# Patient Record
Sex: Male | Born: 1940
Health system: Southern US, Community
[De-identification: ages and names within clinical notes are randomized; demographics above are authoritative.]

## PROBLEM LIST (undated history)

## (undated) DIAGNOSIS — Z87442 Personal history of urinary calculi: Secondary | ICD-10-CM

## (undated) DIAGNOSIS — C61 Malignant neoplasm of prostate: Secondary | ICD-10-CM

## (undated) DIAGNOSIS — K209 Esophagitis, unspecified without bleeding: Secondary | ICD-10-CM

## (undated) DIAGNOSIS — K4090 Unilateral inguinal hernia, without obstruction or gangrene, not specified as recurrent: Secondary | ICD-10-CM

## (undated) DIAGNOSIS — K219 Gastro-esophageal reflux disease without esophagitis: Secondary | ICD-10-CM

## (undated) DIAGNOSIS — K648 Other hemorrhoids: Secondary | ICD-10-CM

## (undated) DIAGNOSIS — I10 Essential (primary) hypertension: Secondary | ICD-10-CM

## (undated) DIAGNOSIS — K5790 Diverticulosis of intestine, part unspecified, without perforation or abscess without bleeding: Secondary | ICD-10-CM

## (undated) DIAGNOSIS — E785 Hyperlipidemia, unspecified: Secondary | ICD-10-CM

## (undated) DIAGNOSIS — K222 Esophageal obstruction: Secondary | ICD-10-CM

## (undated) DIAGNOSIS — M109 Gout, unspecified: Secondary | ICD-10-CM

## (undated) DIAGNOSIS — R0600 Dyspnea, unspecified: Secondary | ICD-10-CM

## (undated) DIAGNOSIS — K859 Acute pancreatitis without necrosis or infection, unspecified: Secondary | ICD-10-CM

## (undated) DIAGNOSIS — F419 Anxiety disorder, unspecified: Secondary | ICD-10-CM

## (undated) DIAGNOSIS — R972 Elevated prostate specific antigen [PSA]: Secondary | ICD-10-CM

## (undated) DIAGNOSIS — T7840XA Allergy, unspecified, initial encounter: Secondary | ICD-10-CM

## (undated) DIAGNOSIS — D126 Benign neoplasm of colon, unspecified: Secondary | ICD-10-CM

## (undated) DIAGNOSIS — M1990 Unspecified osteoarthritis, unspecified site: Secondary | ICD-10-CM

## (undated) DIAGNOSIS — H269 Unspecified cataract: Secondary | ICD-10-CM

## (undated) HISTORY — DX: Unspecified osteoarthritis, unspecified site: M19.90

## (undated) HISTORY — DX: Diverticulosis of intestine, part unspecified, without perforation or abscess without bleeding: K57.90

## (undated) HISTORY — DX: Malignant neoplasm of prostate: C61

## (undated) HISTORY — DX: Benign neoplasm of colon, unspecified: D12.6

## (undated) HISTORY — DX: Allergy, unspecified, initial encounter: T78.40XA

## (undated) HISTORY — PX: UPPER GASTROINTESTINAL ENDOSCOPY: SHX188

## (undated) HISTORY — DX: Anxiety disorder, unspecified: F41.9

## (undated) HISTORY — PX: TONSILLECTOMY: SUR1361

## (undated) HISTORY — DX: Gastro-esophageal reflux disease without esophagitis: K21.9

## (undated) HISTORY — DX: Hyperlipidemia, unspecified: E78.5

## (undated) HISTORY — PX: OTHER SURGICAL HISTORY: SHX169

## (undated) HISTORY — DX: Unspecified cataract: H26.9

## (undated) HISTORY — DX: Other hemorrhoids: K64.8

## (undated) HISTORY — DX: Esophagitis, unspecified without bleeding: K20.90

## (undated) HISTORY — PX: COLONOSCOPY: SHX174

## (undated) HISTORY — DX: Esophageal obstruction: K22.2

## (undated) HISTORY — DX: Elevated prostate specific antigen (PSA): R97.20

## (undated) HISTORY — DX: Essential (primary) hypertension: I10

## (undated) HISTORY — PX: BACK SURGERY: SHX140

---
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
## 1993-08-03 ENCOUNTER — Encounter: Payer: Self-pay | Admitting: Family Medicine

## 1996-11-03 ENCOUNTER — Encounter: Payer: Self-pay | Admitting: Family Medicine

## 1997-03-05 ENCOUNTER — Encounter: Payer: Self-pay | Admitting: Family Medicine

## 1999-07-04 ENCOUNTER — Encounter: Payer: Self-pay | Admitting: Family Medicine

## 2001-02-02 ENCOUNTER — Encounter: Payer: Self-pay | Admitting: Family Medicine

## 2001-04-05 ENCOUNTER — Encounter: Payer: Self-pay | Admitting: Family Medicine

## 2001-04-05 LAB — CONVERTED CEMR LAB: PSA: 2.4 ng/mL

## 2002-08-25 ENCOUNTER — Encounter: Payer: Self-pay | Admitting: Orthopedic Surgery

## 2002-09-02 ENCOUNTER — Inpatient Hospital Stay (HOSPITAL_COMMUNITY): Admission: RE | Admit: 2002-09-02 | Discharge: 2002-09-07 | Payer: Self-pay | Admitting: Orthopedic Surgery

## 2002-09-02 HISTORY — PX: TOTAL KNEE ARTHROPLASTY: SHX125

## 2002-09-07 ENCOUNTER — Inpatient Hospital Stay (HOSPITAL_COMMUNITY)
Admission: RE | Admit: 2002-09-07 | Discharge: 2002-09-11 | Payer: Self-pay | Admitting: Physical Medicine & Rehabilitation

## 2002-11-04 ENCOUNTER — Encounter: Payer: Self-pay | Admitting: Family Medicine

## 2002-11-04 LAB — CONVERTED CEMR LAB: PSA: 3.7 ng/mL

## 2003-05-04 ENCOUNTER — Encounter: Payer: Self-pay | Admitting: Family Medicine

## 2003-05-04 LAB — CONVERTED CEMR LAB: PSA: 2.5 ng/mL

## 2004-01-11 ENCOUNTER — Ambulatory Visit: Payer: Self-pay | Admitting: Family Medicine

## 2004-05-03 ENCOUNTER — Encounter: Payer: Self-pay | Admitting: Family Medicine

## 2004-05-03 LAB — CONVERTED CEMR LAB: PSA: 4.35 ng/mL

## 2004-06-01 ENCOUNTER — Ambulatory Visit: Payer: Self-pay | Admitting: Family Medicine

## 2004-06-05 ENCOUNTER — Ambulatory Visit: Payer: Self-pay | Admitting: Family Medicine

## 2004-06-19 ENCOUNTER — Ambulatory Visit: Payer: Self-pay | Admitting: Family Medicine

## 2004-07-03 ENCOUNTER — Ambulatory Visit: Payer: Self-pay | Admitting: Family Medicine

## 2004-07-03 LAB — CONVERTED CEMR LAB
PSA: 2.68 ng/mL
PSA: 2.68 ng/mL

## 2004-07-06 ENCOUNTER — Ambulatory Visit: Payer: Self-pay | Admitting: Family Medicine

## 2005-06-03 ENCOUNTER — Encounter: Payer: Self-pay | Admitting: Family Medicine

## 2005-06-03 LAB — CONVERTED CEMR LAB
PSA: 4.12 ng/mL
PSA: 4.12 ng/mL

## 2005-06-11 ENCOUNTER — Ambulatory Visit: Payer: Self-pay | Admitting: Family Medicine

## 2005-06-13 ENCOUNTER — Ambulatory Visit: Payer: Self-pay | Admitting: Family Medicine

## 2005-06-28 ENCOUNTER — Ambulatory Visit: Payer: Self-pay | Admitting: Family Medicine

## 2006-06-04 ENCOUNTER — Encounter: Payer: Self-pay | Admitting: Family Medicine

## 2006-06-04 LAB — CONVERTED CEMR LAB: PSA: 3.98 ng/mL

## 2006-06-18 ENCOUNTER — Ambulatory Visit: Payer: Self-pay | Admitting: Family Medicine

## 2006-06-18 LAB — CONVERTED CEMR LAB
BUN: 16 mg/dL (ref 6–23)
Chloride: 108 meq/L (ref 96–112)
Creatinine, Ser: 1.2 mg/dL (ref 0.4–1.5)
Glucose, Bld: 103 mg/dL — ABNORMAL HIGH (ref 70–99)
PSA: 3.98 ng/mL (ref 0.10–4.00)
Sodium: 140 meq/L (ref 135–145)

## 2006-06-21 ENCOUNTER — Ambulatory Visit: Payer: Self-pay | Admitting: Family Medicine

## 2006-06-26 ENCOUNTER — Ambulatory Visit: Payer: Self-pay | Admitting: Family Medicine

## 2006-06-26 LAB — CONVERTED CEMR LAB
Basophils Absolute: 0.1 10*3/uL (ref 0.0–0.1)
Basophils Relative: 0.6 % (ref 0.0–1.0)
Eosinophils Absolute: 0.3 10*3/uL (ref 0.0–0.6)
Eosinophils Relative: 3.2 % (ref 0.0–5.0)
Glucose, Bld: 95 mg/dL (ref 70–99)
HCT: 41 % (ref 39.0–52.0)
Hemoglobin: 14.5 g/dL (ref 13.0–17.0)
Hgb A1c MFr Bld: 5.6 % (ref 4.6–6.0)
MCHC: 35.3 g/dL (ref 30.0–36.0)
MCV: 89.2 fL (ref 78.0–100.0)
Monocytes Absolute: 0.5 10*3/uL (ref 0.2–0.7)
Monocytes Relative: 5.7 % (ref 3.0–11.0)
Neutro Abs: 6.6 10*3/uL (ref 1.4–7.7)
Platelets: 176 10*3/uL (ref 150–400)
WBC: 9.6 10*3/uL (ref 4.5–10.5)

## 2006-07-15 ENCOUNTER — Ambulatory Visit: Payer: Self-pay | Admitting: Family Medicine

## 2006-07-16 ENCOUNTER — Encounter: Payer: Self-pay | Admitting: Family Medicine

## 2006-08-19 ENCOUNTER — Encounter: Payer: Self-pay | Admitting: Family Medicine

## 2006-08-19 DIAGNOSIS — R7989 Other specified abnormal findings of blood chemistry: Secondary | ICD-10-CM | POA: Insufficient documentation

## 2006-08-19 DIAGNOSIS — F411 Generalized anxiety disorder: Secondary | ICD-10-CM | POA: Insufficient documentation

## 2006-08-19 DIAGNOSIS — I10 Essential (primary) hypertension: Secondary | ICD-10-CM | POA: Insufficient documentation

## 2006-08-19 DIAGNOSIS — E785 Hyperlipidemia, unspecified: Secondary | ICD-10-CM | POA: Insufficient documentation

## 2006-08-21 ENCOUNTER — Ambulatory Visit: Payer: Self-pay | Admitting: Family Medicine

## 2006-08-21 DIAGNOSIS — R197 Diarrhea, unspecified: Secondary | ICD-10-CM

## 2006-09-16 ENCOUNTER — Ambulatory Visit: Payer: Self-pay | Admitting: Gastroenterology

## 2006-10-31 ENCOUNTER — Encounter: Payer: Self-pay | Admitting: Gastroenterology

## 2006-10-31 ENCOUNTER — Ambulatory Visit: Payer: Self-pay | Admitting: Gastroenterology

## 2006-11-07 ENCOUNTER — Ambulatory Visit: Payer: Self-pay | Admitting: Gastroenterology

## 2006-11-07 LAB — HM COLONOSCOPY

## 2006-11-21 ENCOUNTER — Ambulatory Visit: Payer: Self-pay | Admitting: Gastroenterology

## 2006-11-21 ENCOUNTER — Encounter: Payer: Self-pay | Admitting: Gastroenterology

## 2006-11-21 ENCOUNTER — Encounter: Payer: Self-pay | Admitting: Family Medicine

## 2006-11-21 DIAGNOSIS — K209 Esophagitis, unspecified without bleeding: Secondary | ICD-10-CM | POA: Insufficient documentation

## 2006-11-21 HISTORY — PX: ESOPHAGOGASTRODUODENOSCOPY: SHX1529

## 2006-12-02 ENCOUNTER — Ambulatory Visit: Payer: Self-pay | Admitting: Gastroenterology

## 2007-01-17 ENCOUNTER — Ambulatory Visit: Payer: Self-pay | Admitting: Family Medicine

## 2007-01-19 LAB — CONVERTED CEMR LAB
BUN: 19 mg/dL (ref 6–23)
CO2: 30 meq/L (ref 19–32)
Calcium: 9.1 mg/dL (ref 8.4–10.5)
Chloride: 104 meq/L (ref 96–112)
GFR calc non Af Amer: 59 mL/min
Glucose, Bld: 97 mg/dL (ref 70–99)

## 2007-01-22 ENCOUNTER — Ambulatory Visit: Payer: Self-pay | Admitting: Family Medicine

## 2007-06-23 ENCOUNTER — Ambulatory Visit: Payer: Self-pay | Admitting: Family Medicine

## 2007-06-23 LAB — CONVERTED CEMR LAB
ALT: 24 units/L (ref 0–53)
Bilirubin, Direct: 0.1 mg/dL (ref 0.0–0.3)
Calcium: 9.4 mg/dL (ref 8.4–10.5)
Creatinine, Ser: 1.4 mg/dL (ref 0.4–1.5)
Creatinine,U: 293.3 mg/dL
GFR calc Af Amer: 65 mL/min
Glucose, Bld: 95 mg/dL (ref 70–99)
HDL: 30.8 mg/dL — ABNORMAL LOW (ref >39.0)
Microalb Creat Ratio: 11.6 mg/g (ref 0.0–30.0)
Microalb, Ur: 3.4 mg/dL — ABNORMAL HIGH (ref 0.0–1.9)
Sodium: 140 meq/L (ref 135–145)
Total CHOL/HDL Ratio: 5.6
Total Protein: 7.6 g/dL (ref 6.0–8.3)
Triglycerides: 147 mg/dL (ref 0–149)
VLDL: 29 mg/dL (ref 0–40)

## 2007-06-26 ENCOUNTER — Ambulatory Visit: Payer: Self-pay | Admitting: Family Medicine

## 2007-06-26 DIAGNOSIS — F329 Major depressive disorder, single episode, unspecified: Secondary | ICD-10-CM

## 2007-06-26 DIAGNOSIS — F3289 Other specified depressive episodes: Secondary | ICD-10-CM | POA: Insufficient documentation

## 2007-07-25 ENCOUNTER — Ambulatory Visit: Payer: Self-pay | Admitting: Family Medicine

## 2007-07-25 LAB — CONVERTED CEMR LAB: OCCULT 1: NEGATIVE

## 2007-07-29 ENCOUNTER — Encounter (INDEPENDENT_AMBULATORY_CARE_PROVIDER_SITE_OTHER): Payer: Self-pay | Admitting: *Deleted

## 2007-10-10 ENCOUNTER — Ambulatory Visit: Payer: Self-pay | Admitting: Family Medicine

## 2007-10-11 ENCOUNTER — Encounter: Payer: Self-pay | Admitting: Family Medicine

## 2007-10-14 ENCOUNTER — Telehealth: Payer: Self-pay | Admitting: Family Medicine

## 2008-04-29 ENCOUNTER — Emergency Department: Payer: Self-pay | Admitting: Emergency Medicine

## 2008-04-30 ENCOUNTER — Telehealth: Payer: Self-pay | Admitting: Family Medicine

## 2008-06-24 ENCOUNTER — Telehealth: Payer: Self-pay | Admitting: Family Medicine

## 2008-06-28 ENCOUNTER — Ambulatory Visit: Payer: Self-pay | Admitting: Family Medicine

## 2008-06-29 LAB — CONVERTED CEMR LAB
ALT: 28 units/L (ref 0–53)
Alkaline Phosphatase: 74 units/L (ref 39–117)
BUN: 20 mg/dL (ref 6–23)
Basophils Relative: 0.3 % (ref 0.0–3.0)
Bilirubin, Direct: 0.1 mg/dL (ref 0.0–0.3)
Calcium: 9.3 mg/dL (ref 8.4–10.5)
Chloride: 108 meq/L (ref 96–112)
Cholesterol: 170 mg/dL (ref 0–200)
Creatinine, Ser: 1.3 mg/dL (ref 0.4–1.5)
Eosinophils Relative: 3.7 % (ref 0.0–5.0)
GFR calc non Af Amer: 58.22 mL/min (ref >60)
HDL: 33.2 mg/dL — ABNORMAL LOW (ref >39.00)
LDL Cholesterol: 121 mg/dL — ABNORMAL HIGH (ref 0–99)
Lymphocytes Relative: 26.8 % (ref 12.0–46.0)
MCV: 91.4 fL (ref 78.0–100.0)
Microalb, Ur: 0.4 mg/dL (ref 0.0–1.9)
Monocytes Absolute: 0.3 10*3/uL (ref 0.1–1.0)
Monocytes Relative: 4.5 % (ref 3.0–12.0)
Neutrophils Relative %: 64.7 % (ref 43.0–77.0)
PSA: 4.12 ng/mL — ABNORMAL HIGH (ref 0.10–4.00)
Platelets: 161 10*3/uL (ref 150.0–400.0)
RBC: 4.45 M/uL (ref 4.22–5.81)
Total Bilirubin: 0.9 mg/dL (ref 0.3–1.2)
Total CHOL/HDL Ratio: 5
Total Protein: 7.1 g/dL (ref 6.0–8.3)
Triglycerides: 79 mg/dL (ref 0.0–149.0)
VLDL: 15.8 mg/dL (ref 0.0–40.0)
WBC: 6.6 10*3/uL (ref 4.5–10.5)

## 2008-06-30 ENCOUNTER — Ambulatory Visit: Payer: Self-pay | Admitting: Family Medicine

## 2008-06-30 DIAGNOSIS — K5730 Diverticulosis of large intestine without perforation or abscess without bleeding: Secondary | ICD-10-CM | POA: Insufficient documentation

## 2009-06-22 ENCOUNTER — Ambulatory Visit: Payer: Self-pay | Admitting: Family Medicine

## 2009-06-22 LAB — CONVERTED CEMR LAB
AST: 29 units/L (ref 0–37)
Alkaline Phosphatase: 60 units/L (ref 39–117)
Bilirubin, Direct: 0.1 mg/dL (ref 0.0–0.3)
CO2: 31 meq/L (ref 19–32)
Calcium: 9 mg/dL (ref 8.4–10.5)
Creatinine,U: 187.6 mg/dL
Eosinophils Relative: 4.9 % (ref 0.0–5.0)
GFR calc non Af Amer: 63.67 mL/min (ref >60)
HDL: 35.7 mg/dL — ABNORMAL LOW (ref >39.00)
Lymphocytes Relative: 28.4 % (ref 12.0–46.0)
Microalb Creat Ratio: 6.4 mg/g (ref 0.0–30.0)
Microalb, Ur: 1.2 mg/dL (ref 0.0–1.9)
Monocytes Relative: 7.4 % (ref 3.0–12.0)
Neutrophils Relative %: 58.8 % (ref 43.0–77.0)
PSA: 4.03 ng/mL — ABNORMAL HIGH (ref 0.10–4.00)
Platelets: 178 10*3/uL (ref 150.0–400.0)
Potassium: 4.3 meq/L (ref 3.5–5.1)
RBC: 4.56 M/uL (ref 4.22–5.81)
Sodium: 140 meq/L (ref 135–145)
TSH: 2.47 microintl units/mL (ref 0.35–5.50)
Total CHOL/HDL Ratio: 4
VLDL: 25.2 mg/dL (ref 0.0–40.0)
WBC: 7.1 10*3/uL (ref 4.5–10.5)

## 2009-07-04 ENCOUNTER — Ambulatory Visit: Payer: Self-pay | Admitting: Family Medicine

## 2009-12-20 ENCOUNTER — Telehealth: Payer: Self-pay | Admitting: Family Medicine

## 2010-02-07 ENCOUNTER — Encounter: Payer: Self-pay | Admitting: Family Medicine

## 2010-04-04 NOTE — Assessment & Plan Note (Signed)
Summary: CHECK UP/CLE   Vital Signs:  Patient profile:   70 year old male Height:      69 inches Weight:      234.75 pounds BMI:     34.79 Temp:     97.7 degrees F oral Pulse rate:   64 / minute Pulse rhythm:   regular BP sitting:   112 / 68  (left arm) Cuff size:   large  Vitals Entered By: Sydell Axon LPN (Jul 04, 8248 9:11 AM) CC: 30 Minute checkup, had a colonoscopy 08/08 by Dr. Arlyce Dice   History of Present Illness: Pt here for Comp Exam. He feels well and has no complaints.  Preventive Screening-Counseling & Management  Alcohol-Tobacco     Alcohol drinks/day: 0     Smoking Status: never     Passive Smoke Exposure: no  Caffeine-Diet-Exercise     Caffeine use/day: 3     Does Patient Exercise: yes no formal exercise     Type of exercise: physical work, Systems analyst, Research scientist (physical sciences) in [place]     Times/week: 3  Problems Prior to Update: 1)  Health Maintenance Exam  (ICD-V70.0) 2)  Special Screening Malignant Neoplasm of Prostate  (ICD-V76.44) 3)  Diverticulosis, Colon  (ICD-562.10) 4)  Esophagitis  (ICD-530.10) 5)  Depression  (ICD-311) 6)  Tinea Cruris  (ICD-110.3) 7)  Esophagitis, Acute  (ICD-530.12) 8)  Esophageal Stricture, Dilated  (ICD-530.3) 9)  Diarrhea, Bloody  (ICD-787.91) 10)  Hyperglycemia  (ICD-790.6) 11)  Hypertension  (ICD-401.9) 12)  Hyperlipidemia  (ICD-272.4) 13)  Anxiety  (ICD-300.00) 14)  Screening For Malignannt Neoplasm, Site Nec  (ICD-V76.49)  Medications Prior to Update: 1)  Benazepril-Hydrochlorothiazide 20-12.5 Mg Tabs (Benazepril-Hydrochlorothiazide) .... Take 1 Tablet By Mouth Once A Day 2)  Theragran-M Fish Oil Conc 1200 Mg  Caps (Omega-3 Fatty Acids) .Marland Kitchen.. 1 By Mouth Daily 3)  Cvs Spectravite Senior   Tabs (Multiple Vitamins-Minerals) .Marland Kitchen.. 1 By Mouth Qam 4)  Naproxen Dr 375 Mg  Tbec (Naproxen) .... As Needed  - No More Than 2/day 5)  Viagra   Tabs (Sildenafil Citrate Tabs) .... As Needed 6)  Zovirax 5 %  Oint (Acyclovir) .... As Needed Cold  Sores 7)  Polysporin   Oint (Bacitracin-Polymyxin B Oint) .... As Needed 8)  Antifungal   Crea (Tolnaftate Crea) .... As Needed 9)  Fiber Tablets 10)  Naftin 1 %  Crea (Naftifine Hcl) .... As Needed  - Prescribed By Vaughan Sine.  Allergies: 1)  ! Tylenol 8 Hour 2)  Penicillin V Potassium (Penicillin V Potassium) 3)  Aspirin (Aspirin) 4)  * Otc Meds  Past History:  Past Medical History: Last updated: 05/16/2007 Anxiety Hyperlipidemia Hypertension  Past Surgical History: Last updated: 06/30/2008 1947        Tonsillectomy 1957        Bx of Lymph Glands 1980s      Back Surgery  L4/5 09/02/02    Bilat. total knee replacement   (Dr Despina Hick)           Spinal/epidural [facility] 08/12/97    Cath - nml. EF 65% 06/26/91    CT L/S stenosis L4-5 with disc bulge, right 11/21/06    EGD Esophagitis, Stricture/dilated 11/21/06    Colonoscopy [facility] (Dr Arlyce Dice)  Family History: Last updated: 07/16/09 Father: Died 29 (2005-05-15) ulcers, diverticulitis with peritonitis Mother: Died 4 multiple problems Brother A 72  Channing Mutters)  HTN, obese (350#) HBP:  (+) brother, self Depression:  (+) Mother's side, self once ETOH:  (+)  mother's brother Stroke:  GM  Social History: Last updated: 08/19/2006 Marital Status:Re- Married, lives with wife Children: 1 daughter Haynes Dage), 2 step-children Occupation: Retired Designer, 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 Never Smoked Alcohol use-no Drug use-no  Risk Factors: Alcohol Use: 0 (07/04/2009) Caffeine Use: 3 (07/04/2009) Exercise: yes no formal exercise (07/04/2009)  Risk Factors: Smoking Status: never (07/04/2009) Passive Smoke Exposure: no (07/04/2009)  Family History: Father: Died 63 (06-02-05) ulcers, diverticulitis with peritonitis Mother: Died 66 multiple problems Brother A 72  (Roy)  HTN, obese (350#) HBP:  (+) brother, self Depression:  (+) Mother's side, self once ETOH:  (+) mother's brother Stroke:  GM  Review of Systems General:   Denies chills, fatigue, fever, sweats, weakness, and weight loss. Eyes:  Denies blurring, discharge, eye pain, and itching; developing catarract. ENT:  Denies decreased hearing, earache, and ringing in ears. CV:  Denies chest pain or discomfort, fainting, fatigue, palpitations, shortness of breath with exertion, swelling of feet, and swelling of hands. Resp:  Denies cough, shortness of breath, and wheezing. GI:  Complains of diarrhea; denies abdominal pain, bloody stools, change in bowel habits, constipation, dark tarry stools, indigestion, loss of appetite, nausea, vomiting, vomiting blood, and yellowish skin color; occas. GU:  Denies discharge, nocturia, and urinary frequency. MS:  Complains of joint pain; denies low back pain, muscle aches, cramps, and stiffness; right wrist fracture now with arthritis. Derm:  Denies dryness, itching, and rash. Neuro:  Denies numbness, poor balance, tingling, and tremors.  Physical Exam  General:  Well-developed,well-nourished,in no acute distress; alert,appropriate and cooperative throughout examination, mildly overweight. Head:  Normocephalic and atraumatic without obvious abnormalities. No apparent alopecia or balding. Sinuses nontender. Eyes:  Conjunctiva clear bilaterally.  Ears:  External ear exam shows no significant lesions or deformities.  Otoscopic examination reveals clear canals, tympanic membranes are intact bilaterally without bulging, retraction, inflammation or discharge. Hearing is grossly normal bilaterally. Nose:  External nasal examination shows no deformity or inflammation. Nasal mucosa are pink and moist without lesions or exudates. Mouth:  Oral mucosa and oropharynx without lesions or exudates.  Teeth in good repair. Neck:  No deformities, masses, or tenderness noted. Chest Wall:  No deformities, masses, tenderness or gynecomastia noted. Breasts:  No masses or gynecomastia noted Lungs:  Normal respiratory effort, chest expands  symmetrically. Lungs are clear to auscultation, no crackles or wheezes. Heart:  Normal rate and regular rhythm. S1 and S2 normal without gallop, murmur, click, rub or other extra sounds. Abdomen:  Bowel sounds positive,abdomen soft and non-tender without masses, organomegaly  noted. Rectal:  No external abnormalities noted. Normal sphincter tone. No rectal masses or tenderness. G neg. Genitalia:  Testes bilaterally descended without nodularity, tenderness or masses. No scrotal masses or lesions. No penis lesions or urethral discharge. left inguinal hernia, chronic...does not seem to need attention. Prostate:  Prostate gland firm and smooth, no enlargement, nodularity, tenderness, mass, asymmetry or induration. 40gms. Msk:  No deformity or scoliosis noted of thoracic or lumbar spine.   Pulses:  R and L radial and posterior tibial pulses are full and equal bilaterally  Extremities:  4th digit right hand with irregular nail bed, majority of nail fallen off, mild erythema at nail bed, nontender, no fluctuance Neurologic:  No cranial nerve deficits noted. Station and gait are normal.  Sensory, motor and coordinative functions appear intact. Skin:  Intact without suspicious lesions or rashes Cervical Nodes:  No lymphadenopathy noted Inguinal Nodes:  No significant adenopathy Psych:  Cognition  and judgment appear intact. Alert and cooperative with normal attention span and concentration. No apparent delusions, illusions, hallucinations   Impression & Recommendations:  Problem # 1:  HEALTH MAINTENANCE EXAM (ICD-V70.0)  Reviewed preventive care protocols, scheduled due services, and updated immunizations. Look into Zostavax.  Problem # 2:  SPECIAL SCREENING MALIGNANT NEOPLASM OF PROSTATE (ICD-V76.44) Assessment: Unchanged PSA chronically elevated...ap[pears stable.  Problem # 3:  DIVERTICULOSIS, COLON (ICD-562.10) Assessment: Unchanged  Discussed being seen for prolonged LLQ discomfort.  Labs  Reviewed: Hgb: 14.6 (06/22/2009)   Hct: 41.1 (06/22/2009)   WBC: 7.1 (06/22/2009)  Problem # 4:  DEPRESSION (ICD-311) Assessment: Unchanged Stable.  Problem # 5:  ESOPHAGEAL STRICTURE, DILATED (ICD-530.3) Assessment: Unchanged Stable, no recent probs.  Problem # 6:  HYPERGLYCEMIA (ICD-790.6) Assessment: Improved Euglycemic today. Wt loss will help.  Problem # 7:  HYPERTENSION (ICD-401.9) Assessment: Unchanged  His updated medication list for this problem includes:    Benazepril-hydrochlorothiazide 20-12.5 Mg Tabs (Benazepril-hydrochlorothiazide) .Marland Kitchen... Take 1 tablet by mouth once a day  BP today: 112/68 Prior BP: 120/80 (06/30/2008)  Labs Reviewed: K+: 4.3 (06/22/2009) Creat: : 1.2 (06/22/2009)   Chol: 156 (06/22/2009)   HDL: 35.70 (06/22/2009)   LDL: 95 (06/22/2009)   TG: 126.0 (06/22/2009)  Problem # 8:  HYPERLIPIDEMIA (ICD-272.4) Assessment: Unchanged Stable. Labs Reviewed: SGOT: 29 (06/22/2009)   SGPT: 28 (06/22/2009)   HDL:35.70 (06/22/2009), 33.20 (06/28/2008)  LDL:95 (06/22/2009), 121 (16/12/9602)  Chol:156 (06/22/2009), 170 (06/28/2008)  Trig:126.0 (06/22/2009), 79.0 (06/28/2008)  Problem # 9:  ANXIETY (ICD-300.00) Assessment: Unchanged Stable.  Complete Medication List: 1)  Benazepril-hydrochlorothiazide 20-12.5 Mg Tabs (Benazepril-hydrochlorothiazide) .... Take 1 tablet by mouth once a day 2)  Theragran-m Fish Oil Conc 1200 Mg Caps (Omega-3 fatty acids) .Marland Kitchen.. 1 by mouth daily 3)  Cvs Spectravite Senior Tabs (Multiple vitamins-minerals) .Marland Kitchen.. 1 by mouth qam 4)  Naproxen Dr 375 Mg Tbec (Naproxen) .... As needed  - no more than 2/day 5)  Viagra Tabs (Sildenafil citrate tabs) .... As needed 6)  Zovirax 5 % Oint (Acyclovir) .... As needed cold sores 7)  Polysporin Oint (Bacitracin-polymyxin b oint) .... As needed 8)  Antifungal Crea (Tolnaftate crea) .... As needed 9)  Fiber Tablets  10)  Naftin 1 % Crea (Naftifine hcl) .... As needed  - prescribed by derm. 11)   Citrucel Powd (Methylcellulose (laxative)) .... Take daily as directed  Patient Instructions: 1)  RTC one year, sooner as needed.  Current Allergies (reviewed today): ! TYLENOL 8 HOUR PENICILLIN V POTASSIUM (PENICILLIN V POTASSIUM) ASPIRIN (ASPIRIN) * OTC MEDS

## 2010-04-04 NOTE — Miscellaneous (Signed)
Summary: Zostavax   Clinical Lists Changes  Observations: Added new observation of ZOSTAVAX: Zostavax (12/28/2009 8:55)      Other Immunization History:    Zostavax # 1:  Zostavax (12/28/2009) Received form from [facility], [place], [place]

## 2010-04-04 NOTE — Progress Notes (Signed)
Summary: wants order for zostavax  Phone Note Call from Patient   Caller: Spouse Summary of Call: Pt is requesting order for zostavax.  He wants to get this at [facility], which is more convenient for him.  His insurance will pay.  Please send order to [facility]. Initial call taken by: Lowella Petties CMA,  December 20, 2009 4:53 PM    New/Updated Medications: ZOSTAVAX 71062 UNT/0.65ML SOLR (ZOSTER VACCINE LIVE) pls administer one dose Prescriptions: ZOSTAVAX 69485 UNT/0.65ML SOLR (ZOSTER VACCINE LIVE) pls administer one dose  #1 x 0   Entered and Authorized by:   Shaune Leeks MD   Signed by:   Shaune Leeks MD on 12/20/2009   Method used:   Electronically to        [facility] (retail)       [address]       [place], [place]  [address]       Ph: [phone]       Fax: [phone]   RxID:   [id number]

## 2010-05-11 ENCOUNTER — Encounter: Payer: Self-pay | Admitting: Family Medicine

## 2010-06-19 ENCOUNTER — Other Ambulatory Visit: Payer: Self-pay | Admitting: Family Medicine

## 2010-06-19 DIAGNOSIS — R7989 Other specified abnormal findings of blood chemistry: Secondary | ICD-10-CM

## 2010-06-19 DIAGNOSIS — K5730 Diverticulosis of large intestine without perforation or abscess without bleeding: Secondary | ICD-10-CM

## 2010-06-19 DIAGNOSIS — I10 Essential (primary) hypertension: Secondary | ICD-10-CM

## 2010-06-19 DIAGNOSIS — Z125 Encounter for screening for malignant neoplasm of prostate: Secondary | ICD-10-CM

## 2010-06-19 DIAGNOSIS — E785 Hyperlipidemia, unspecified: Secondary | ICD-10-CM

## 2010-07-03 ENCOUNTER — Other Ambulatory Visit (INDEPENDENT_AMBULATORY_CARE_PROVIDER_SITE_OTHER): Payer: Medicare Other

## 2010-07-03 DIAGNOSIS — I10 Essential (primary) hypertension: Secondary | ICD-10-CM

## 2010-07-03 DIAGNOSIS — K5730 Diverticulosis of large intestine without perforation or abscess without bleeding: Secondary | ICD-10-CM

## 2010-07-03 DIAGNOSIS — Z125 Encounter for screening for malignant neoplasm of prostate: Secondary | ICD-10-CM

## 2010-07-03 DIAGNOSIS — E785 Hyperlipidemia, unspecified: Secondary | ICD-10-CM

## 2010-07-03 DIAGNOSIS — R7989 Other specified abnormal findings of blood chemistry: Secondary | ICD-10-CM

## 2010-07-03 LAB — BASIC METABOLIC PANEL
CO2: 29 mEq/L (ref 19–32)
Calcium: 8.9 mg/dL (ref 8.4–10.5)
GFR: 62.87 mL/min (ref >60.00)
Sodium: 136 mEq/L (ref 135–145)

## 2010-07-03 LAB — CBC WITH DIFFERENTIAL/PLATELET
Basophils Relative: 0.5 % (ref 0.0–3.0)
Eosinophils Relative: 4.2 % (ref 0.0–5.0)
Hemoglobin: 14.1 g/dL (ref 13.0–17.0)
Lymphocytes Relative: 31.9 % (ref 12.0–46.0)
MCHC: 35 g/dL (ref 30.0–36.0)
Monocytes Relative: 7.9 % (ref 3.0–12.0)
Neutro Abs: 3.8 10*3/uL (ref 1.4–7.7)
RBC: 4.38 Mil/uL (ref 4.22–5.81)

## 2010-07-03 LAB — HEPATIC FUNCTION PANEL
ALT: 26 U/L (ref 0–53)
Bilirubin, Direct: 0.1 mg/dL (ref 0.0–0.3)
Total Bilirubin: 1 mg/dL (ref 0.3–1.2)

## 2010-07-03 LAB — LIPID PANEL
HDL: 30.7 mg/dL — ABNORMAL LOW (ref >39.00)
LDL Cholesterol: 110 mg/dL — ABNORMAL HIGH (ref 0–99)
Total CHOL/HDL Ratio: 6
Triglycerides: 146 mg/dL (ref 0.0–149.0)

## 2010-07-03 LAB — MICROALBUMIN / CREATININE URINE RATIO: Microalb Creat Ratio: 0.8 mg/g (ref 0.0–30.0)

## 2010-07-06 ENCOUNTER — Ambulatory Visit (INDEPENDENT_AMBULATORY_CARE_PROVIDER_SITE_OTHER): Payer: Medicare Other | Admitting: Family Medicine

## 2010-07-06 ENCOUNTER — Encounter: Payer: Self-pay | Admitting: Family Medicine

## 2010-07-06 DIAGNOSIS — R972 Elevated prostate specific antigen [PSA]: Secondary | ICD-10-CM

## 2010-07-06 DIAGNOSIS — F411 Generalized anxiety disorder: Secondary | ICD-10-CM

## 2010-07-06 DIAGNOSIS — R7989 Other specified abnormal findings of blood chemistry: Secondary | ICD-10-CM

## 2010-07-06 DIAGNOSIS — Z0000 Encounter for general adult medical examination without abnormal findings: Secondary | ICD-10-CM

## 2010-07-06 DIAGNOSIS — C61 Malignant neoplasm of prostate: Secondary | ICD-10-CM | POA: Insufficient documentation

## 2010-07-06 DIAGNOSIS — R200 Anesthesia of skin: Secondary | ICD-10-CM

## 2010-07-06 DIAGNOSIS — K5730 Diverticulosis of large intestine without perforation or abscess without bleeding: Secondary | ICD-10-CM

## 2010-07-06 DIAGNOSIS — E785 Hyperlipidemia, unspecified: Secondary | ICD-10-CM

## 2010-07-06 MED ORDER — CIPROFLOXACIN HCL 500 MG PO TABS: 500.0000 mg | ORAL_TABLET | Freq: Two times a day (BID) | ORAL | Status: DC

## 2010-07-06 NOTE — Progress Notes (Signed)
  Subjective:    Patient ID: [name], male    DOB: [birth year], 70 y.o.   MRN: [id number]  HPI Pt here for Exam. He is continuing to have swallowing problems at times. Rice seems to bother him most. In the last few months, he has numbness in the right hand and especially at night.     Review of Systems  Constitutional: Negative for fever, chills, diaphoresis, appetite change, fatigue and unexpected weight change.  HENT: Negative for hearing loss, ear pain, tinnitus and ear discharge.   Eyes: Negative for pain, discharge and visual disturbance.  Respiratory: Negative for cough, shortness of breath and wheezing.   Cardiovascular: Negative for chest pain and palpitations.       No SOB w/ exertion  Gastrointestinal: Negative for nausea, vomiting, abdominal pain, diarrhea, constipation and blood in stool.       No heartburn or swallowing problems.  Genitourinary: Negative for dysuria, frequency and difficulty urinating.       Some  nocturia  Musculoskeletal: Negative for myalgias, back pain and arthralgias.  Skin: Negative for rash.       No itching or dryness.  Neurological: Negative for tremors and numbness.       No tingling or balance problems.  Hematological: Negative for adenopathy. Does not bruise/bleed easily.  Psychiatric/Behavioral: Negative for dysphoric mood and agitation.       Objective:   Physical Exam  Constitutional: He is oriented to person, place, and time. He appears well-developed and well-nourished. No distress.  HENT:  Head: Normocephalic and atraumatic.  Right Ear: External ear normal.  Left Ear: External ear normal.  Nose: Nose normal.  Mouth/Throat: Oropharynx is clear and moist.  Eyes: Conjunctivae and EOM are normal. Pupils are equal, round, and reactive to light. Right eye exhibits no discharge. Left eye exhibits no discharge. No scleral icterus.  Neck: Normal range of motion. Neck supple. No thyromegaly present.  Cardiovascular: Normal rate, regular  rhythm, normal heart sounds and intact distal pulses.   No murmur heard. Pulmonary/Chest: Effort normal and breath sounds normal. No respiratory distress. He has no wheezes.  Abdominal: Soft. Bowel sounds are normal. He exhibits no distension and no mass. There is no tenderness. There is no rebound and no guarding.  Genitourinary: Rectum normal, prostate normal and penis normal. Guaiac negative stool.  Musculoskeletal: Normal range of motion. He exhibits no edema.  Lymphadenopathy:    He has no cervical adenopathy.  Neurological: He is alert and oriented to person, place, and time. Coordination normal.  Skin: Skin is warm and dry. No rash noted. He is not diaphoretic.  Psychiatric: He has a normal mood and affect. His behavior is normal. Judgment and thought content normal.          Assessment & Plan:  Health Maint PE  I have personally reviewed the Medicare Annual Wellness questionnaire and have noted 1. The patient's medical and social history 2. Their use of alcohol, tobacco or illicit drugs 3. Their current medications and supplements 4. The patient's functional ability including ADL's, fall risks, home safety risks and hearing or visual             impairment. 5. Diet and physical activities 6. Evidence for depression or mood disorders

## 2010-07-06 NOTE — Assessment & Plan Note (Signed)
Adequate but try to restrict fatty foods to get LDL lower.

## 2010-07-06 NOTE — Assessment & Plan Note (Signed)
Euglycemic today. 

## 2010-07-06 NOTE — Assessment & Plan Note (Signed)
Stable

## 2010-07-06 NOTE — Assessment & Plan Note (Signed)
Continues with mild sxs but deals with it. Will try to avoid further dilations.

## 2010-07-06 NOTE — Assessment & Plan Note (Signed)
Come in for prolonged LLQ discomfort. 

## 2010-07-06 NOTE — Patient Instructions (Signed)
RTC 2 mos with PSA and Free PSA prior.

## 2010-07-06 NOTE — Assessment & Plan Note (Signed)
Try baby ASA twice a week. Monitor rash and if appears, stop ASA (his allergy).

## 2010-07-06 NOTE — Assessment & Plan Note (Signed)
PSA high today. His prostate is 30-40 gms but feels nml. Will treat for occult prostatism and recheck in 2 mos.

## 2010-07-07 ENCOUNTER — Other Ambulatory Visit: Payer: Self-pay | Admitting: Family Medicine

## 2010-07-18 NOTE — Assessment & Plan Note (Signed)
[facility]                         GASTROENTEROLOGY OFFICE NOTE   KALEM, ROCKWELL                        MRN:          [id number]  DATE:09/16/2006                            DOB:          01/14/1941    REFERRING PHYSICIAN:  Arta Silence, MD   REASON FOR CONSULTATION:  Change in bowel habits.   [name] is a pleasant, 70 year old, white male referred through the  courtesy of Dr. Hetty Ely for evaluation. Over the last 8-10 months, he  has noted a marked change in his bowel habits from his normal daily  solid bowel movements to 5-6 loose and sometimes frankly watery stools a  day. He occasionally has urgency. He does not awaken to defecate. There  is no history of melena or hematochezia. There has been no change in his  medicines during this time. He has taken antibiotics from time to time  for dental procedures. Weight has been stable and appetite is excellent.   PAST MEDICAL HISTORY:  Pertinent for hypertension and depression. He is  status post total knee replacement x2.   FAMILY HISTORY:  Noncontributory.   MEDICATIONS:  Fish oil, Benazepril/HCTZ and a multivitamin.   He is allergic to PENICILLIN, ASPIRIN, TYLENOL and MOST PAIN MEDICINES.   He neither smokes nor drinks. He is married and retired. He works at  [employer].   REVIEW OF SYSTEMS:  Positive for intermittent dysphagia to solids. He  denies pyrosis. He has some joint pains.   PHYSICAL EXAMINATION:  GENERAL:  He is a healthy-appearing male.  VITAL SIGNS:  Pulse 84, blood pressure 130/70, weight 239.  HEENT: EOMI. PERRLA. Sclerae are anicteric.  Conjunctivae are pink.  NECK:  Supple without thyromegaly, adenopathy or carotid bruits.  CHEST:  Clear to auscultation and percussion without adventitious  sounds.  CARDIAC:  Regular rhythm; normal S1 S2.  There are no murmurs, gallops  or rubs.  ABDOMEN:  Bowel sounds are normoactive.  Abdomen is soft, non-tender and  non-distended.  There are no abdominal masses, tenderness, splenic  enlargement or hepatomegaly.  EXTREMITIES:  Full range of motion.  No cyanosis, clubbing or edema.  RECTAL:  There are no masses.  Stool is Hemoccult negative.   IMPRESSION:  1. Change in bowel habits. A structural lesion in the colon ought to      be ruled out. Pseudomembranous colitis is not likely in view of the      longevity of the symptoms. It is unlikely due to medications.  2. Dysphagia - rule out early esophageal stricture.   RECOMMENDATIONS:  1. Colonoscopy.  2. Upper endoscopy with Savary dilatation as indicated.     Barbette Hair. Arlyce Dice, MD,FACG  Electronically Signed    RDK/MedQ  DD: 09/16/2006  DT: 09/17/2006  Job #: [id number]   cc:   Arta Silence, MD

## 2010-07-18 NOTE — Assessment & Plan Note (Signed)
[facility]                         GASTROENTEROLOGY OFFICE NOTE   [name], [name]                        MRN:          [id number]  DATE:12/02/2006                            DOB:          Nov 14, 1940    PROBLEMS:  1. Dysphagia.  2. Change of bowel habits.   REASON FOR RETURN:  Reason [name] has returned following upper  endoscopy with dilatation and colonoscopy.  The former demonstrated  distal esophageal stricture with mild esophagitis.  Since dilatation he  has had no further episodes of dysphagia.  Colonoscopy demonstrated few  left and right-sided diverticula.  Biopsies were negative for  microscopic colitis.  On Citrucel daily his bowels are more regular.  Altogether he is feeling well.   PHYSICAL EXAMINATION:  VITAL SIGNS:  Pulse 60, blood pressure 124/72,  weight 242.   IMPRESSION:  1. Esophageal stricture - asymptomatic following dilatation therapy.  2. Mild esophagitis.  3. Diverticulosis.   RECOMMENDATIONS:  1. Complete four week course of Protonix.  2. Continue fiber supplementation.     Barbette Hair. Arlyce Dice, MD,FACG  Electronically Signed    RDK/MedQ  DD: 12/02/2006  DT: 12/02/2006  Job #: [id number]   cc:   Arta Silence, MD

## 2010-07-18 NOTE — Letter (Signed)
September 16, 2006    Arta Silence, MD  [address] [place], [place] [address]   RE:  [name], [name]  MRN:  [id number]  /  DOB:  1941/02/02   Dear Dr. Hetty Ely:   Upon your kind referral, I had the pleasure of evaluating your patient  and I am pleased to offer my findings.  I saw [name] in the office  today.  Enclosed is a copy of my progress note that details my findings  and recommendations.   Thank you for the opportunity to participate in your patient's care.    Sincerely,      Barbette Hair. Arlyce Dice, MD,FACG  Electronically Signed    RDK/MedQ  DD: 09/16/2006  DT: 09/17/2006  Job #: [id number]

## 2010-07-18 NOTE — Letter (Signed)
September 16, 2006    [name]. Dan Humphreys   RE:  [name], [name]  MRN:  [id number]  /  DOB:  [birth year]   Dear [name]:   It is my pleasure to have treated you recently as a new patient in my  office.  I appreciate your confidence and the opportunity to participate  in your care.   Since I do have a busy inpatient endoscopy schedule and office schedule,  my office hours vary weekly.  I am, however, available for emergency  calls every day through my office.  If I cannot promptly meet an urgent  office appointment, another one of our gastroenterologists will be able  to assist you.   My well-trained staff are prepared to help you at all times.  For  emergencies after office hours, a physician from our gastroenterology  section is always available through my 24-hour answering service.   While you are under my care, I encourage discussion of your questions  and concerns, and I will be happy to return your calls as soon as I am  available.   Once again, I welcome you as a new patient and I look forward to a happy  and healthy relationship.    Sincerely,      Barbette Hair. Arlyce Dice, MD,FACG  Electronically Signed   RDK/MedQ  DD: 09/16/2006  DT: 09/17/2006  Job #: [id number]

## 2010-07-21 NOTE — H&P (Signed)
[name], [name]                           ACCOUNT NO.:  [id number]   MEDICAL RECORD NO.:  [id number]                   PATIENT TYPE:  INP   LOCATION:  NA                                   FACILITY:  [facility]   PHYSICIAN:  Ollen Gross, M.D.                 DATE OF BIRTH:  11-21-1940   DATE OF ADMISSION:  09/02/2002  DATE OF DISCHARGE:                                HISTORY & PHYSICAL   CHIEF COMPLAINT:  Bilateral knee pain.   HISTORY OF PRESENT ILLNESS:  The patient is a 70 year old male who has been  seen and evaluated by Ollen Gross, M.D. for ongoing progressive bilateral  knee pain.  The right is slightly more worse than the left but he states  both of them hurt significantly.  Pain has been ongoing for over five to six  years now.  He is very active in sports and even played basketball up until  the time his knees really started hurting.  He does not complain of much  swelling, but mainly has pain.  He is retired from the [employer], but  also works at [employer] and is on his feet during the day and has stayed  active.  He is seen in the office where x-rays show significant bone on bone  changes in the medial and patellofemoral compartments on both sides.  The  right seems to be a little bit more involved than the left radiographically.  The pain has been progressive over the past several years.  He has been on  Naproxen in the past.  He comes in requesting surgical intervention.  Risks  and benefits of total knee procedures have been discussed with the patient  at length.  The patient would like to proceed with surgery.  He is  requesting to have both knees done at the same time.  This risk is discussed  with him by Ollen Gross, M.D. and myself.  He has elected to proceed with  undergoing bilateral knee replacement arthroplasties.  The patient is  subsequently admitted to the hospital.   ALLERGIES:  ASPIRIN causing itching and hives.  TYLENOL causing itching and  hives.  PENICILLIN causes itching and hives.   CURRENT MEDICATIONS:  1. Benazepril/hydrochlorothiazide 20/12.5 daily.  2. Naprosyn.  Stop prior to surgery.  3. He is on a [employer] dietary supplement.  Stop prior to surgery.  4. He is on chromium.  Stop prior to surgery.   PAST MEDICAL HISTORY:  1. Past history of anxiety back in the 1980s.  2. Hypertension.  3. History of arm fracture which required surgery.  4. History of back fracture which required surgery.   PAST SURGICAL HISTORY:  1. Tonsillectomy 1947.  2. I&D for an infection in the left glans 1957.  3. Back surgery for his back fracture in the late 1980s.  4. Arm surgery back in the 1957.  FAMILY HISTORY:  Mother deceased age 21, she had multiple surgeries.  Father living age 16 with arthritis.  Has a brother 11 with hypertension.   SOCIAL HISTORY:  He is married, retired from Designer, fashion/clothing.  Currently working at  [employer].  Nonsmoker.  No alcohol.  Has three children.  His wife will be  assisting with care after surgery.   REVIEW OF SYSTEMS:  GENERAL:  No fevers, chills, night sweats.  NEUROLOGIC:  No seizures, syncope, paralysis.  RESPIRATORY:  No shortness of breath,  productive cough, or hemoptysis.  CARDIOVASCULAR:  No chest pain, angina,  orthopnea.  GASTROINTESTINAL:  No nausea, vomiting, diarrhea, constipation.  GENITOURINARY:  No dysuria, hematuria, discharge.  MUSCULOSKELETAL:  Pertinent to that of both knees found in history of present illness.   PHYSICAL EXAMINATION:  VITAL SIGNS:  Pulse 60, respirations 14, blood  pressure 134/74.  GENERAL:  The patient is a 70 year old white male well-nourished, well-  developed.  Appears in no acute distress.  He is alert, oriented,  cooperative, very pleasant at time of examination.  Appears to be an  excellent historian.  HEENT:  Normocephalic, atraumatic.  Pupils round and reactive.  The patient  does not wear glasses.  NECK:  Supple.  No carotid bruits.  CHEST:  Clear to  auscultation anterior/posterior chest walls.  No rhonchi,  rales, or wheezing.  HEART:  Regular rate and rhythm.  No murmur.  S1, S2 noted.  ABDOMEN:  Soft, nontender, slightly round.  Bowel sounds are present.  RECTAL:  Not done.  Not pertinent to present illness.  BREASTS:  Not done.  Not pertinent to present illness.  GENITALIA:  Not done.  Not pertinent to present illness.  EXTREMITIES:  Right and left lower extremities:  He has marked crepitus  noted on passive range of motion of both knees.  Right knee shows range of  motion of 5-115 degrees.  No ligament instability.  He is tender over the  medial joint line more so than the lateral joint line.  Left knee also shows  range of 5-115 degrees.  No ligament instability.  He is also tender on the  left knee more medially than laterally.  He does ambulate with a slightly  antalgic gait.   IMPRESSION:  1. Bilateral knees osteoarthritis.  2. Hypertension.  3. Past history of anxiety.    PLAN:  The patient will be admitted to [facility] to undergo  bilateral total knee replacement arthroplasties.  Surgery will be performed  by Ollen Gross, M.D.  The patient will have postoperative epidural for  postoperative pain control.     Alexzandrew L. Julien Girt, P.A.              Ollen Gross, M.D.    ALP/MEDQ  D:  08/25/2002  T:  08/25/2002  Job:  [id number]   cc:   Laurita Quint, M.D.  [address] [place]  [place] [address]  Fax: [phone]   Ollen Gross, M.D.  [address]  [place]  [place] [address]  Fax: [phone]

## 2010-07-21 NOTE — Discharge Summary (Signed)
[name], [name]                           ACCOUNT NO.:  [id number]   MEDICAL RECORD NO.:  [id number]                   PATIENT TYPE:  IPS   LOCATION:  4151                                 FACILITY:  [facility]   PHYSICIAN:  Ranelle Oyster, M.D.             DATE OF BIRTH:  07/20/1940   DATE OF ADMISSION:  09/07/2002  DATE OF DISCHARGE:  09/11/2002                                 DISCHARGE SUMMARY   DISCHARGE DIAGNOSES:  1. Bilateral total knee arthroplasty secondary to degenerative joint     disease.  2. Anemia.  3. Coumadin for deep vein thrombosis prophylaxis.  4. Pain management.  5. Hypertension.  6. Anxiety.   HPI:  A 70 year old male admitted to [facility] on June 30 with  end-stage degenerative joint disease of the bilateral knees.  No change with  conservative care.  Underwent elective bilateral total knee arthroplasty on  June 30 per Dr. Lequita Halt.  Placed on Coumadin for deep vein thrombosis  prophylaxis.  Weightbearing as tolerated.  Postoperative anemia 8.5.  No  chest pain, no shortness of breath.  Pain management with Tylox.  CPM  machine 0-70 degrees.  Moderate assist mobility.  Latest INR 1.6.  Hemoglobin 8.5.  Urinalysis negative.  Admitted for comprehensive rehab  program.   PAST MEDICAL HISTORY:  See discharge diagnoses.   PAST SURGICAL HISTORY:  1. A radial fracture in 1957.  2. Back surgery.  3. Tonsillectomy.   ALLERGIES:  ASPIRIN, TYLENOL, PENICILLIN.   MEDICATIONS PRIOR TO ADMISSION:  Lotensin, hydrochlorothiazide, and  multivitamins.   SOCIAL HISTORY:  Married, retired Scientist, product/process development, independent prior to  admission.  He now works part-time at [employer].  One-level home, two steps to  enter.  Wife is a Runner, broadcasting/film/video, will be able to provide assistance.  Patient  active prior to admission.   HOSPITAL COURSE:  Patient with progressive __________ and rehab services.  Therapies initiated on a b.i.d. basis.  The following issues were followed  during the patient's rehab course.  Pertaining to [name] bilateral  total knee arthroplasty:  Steri-Strips remained in place.  Weightbearing as  tolerated.  CPM machine 0-85 degrees.  Neurovascular sensation remained  intact.  He would follow up with Dr. Ollen Gross one week after discharge.  He continued on Coumadin for deep vein thrombosis prophylaxis, latest INR  3.2.  There were no bleeding episodes.  Postoperative anemia, will follow up  labs, rehab unit 8.7, follow-up chemistries were pending.  He did have some  mild dizziness, felt to be multifactorial with his history of anemia and  recent surgery.  It was advised to maintain support hose.  Blood pressures  with home regimens of Lotensin and hydrochlorothiazide.  Pain management  with the use of oxycodone and good results.  He had no bowel or bladder  disturbances.  Overall for his functional mobility, he was supervision to  modified independence in  all areas of physical and occupational therapy.  Family teaching was completed.  Home health therapy has been arranged.  He  will be discharged to home.  At the time of dictation, medications included  Coumadin, latest dose 8 mg adjusted accordingly; Lotensin 20 mg daily;  hydrochlorothiazide 12.5 mg daily; Trinsicon twice daily; and oxycodone  immediate release as needed for pain.  Activity was weightbearing as  tolerated.  Diet was regular.   WOUND CARE:  Cleanse incision daily with warm soap and water.   SPECIAL INSTRUCTIONS:  Home health nurse per [facility] to  complete Coumadin protocol.  Home health physical and occupational therapy.  The patient would follow up with Dr. Ollen Gross one week after discharge.     Mariam Dollar, P.A.                     Ranelle Oyster, M.D.    DA/MEDQ  D:  09/10/2002  T:  09/11/2002  Job:  [id number]   cc:   Ollen Gross, M.D.  [address]  [place]  [place] [address]  Fax: [phone]    cc:   Ollen Gross, M.D.  [address]  [place]  [place] [address]  Fax: [phone]

## 2010-07-21 NOTE — Discharge Summary (Signed)
NAMEADOLFO, [name]                           ACCOUNT NO.:  [id number]   MEDICAL RECORD NO.:  [id number]                   PATIENT TYPE:  INP   LOCATION:  0483                                 FACILITY:  [facility]   PHYSICIAN:  Ollen Gross, M.D.                 DATE OF BIRTH:  10-01-1940   DATE OF ADMISSION:  09/02/2002  DATE OF DISCHARGE:  09/07/2002                                 DISCHARGE SUMMARY   ADMITTING DIAGNOSES:  1. Bilateral knees osteoarthritis.  2. Hypertension.  3. Past history of anxiety.   DISCHARGE DIAGNOSES:  1. Osteoarthritis bilateral knees status post bilateral total knee     arthroplasties.  2. Postoperative blood loss anemia.  3. Hypertension.  4. Past history of anxiety.   PROCEDURE:  The patient was taken to the OR on September 02, 2002 and underwent a  bilateral total knee arthroplasty.  Surgeon Ollen Gross, M.D.  Assistant  Alexzandrew L. Julien Girt, P.A.  Surgery under spinal anesthesia with epidural.  Minimal blood loss.  Hemovac drain x1 bilaterally.  Tourniquet time 56  minutes on the right, 58 minutes on the left.   CONSULTATIONS:  Rehabilitation services.   BRIEF HISTORY:  The patient is a 70 year old male seen by Ollen Gross,  M.D. for ongoing progressive bilateral knee pain.  Right was slightly more  worse than the left but he states both are hurting him significantly.  Pain  has been ongoing for five to six years now.  He is seen in the office where  x-ray showed significant bone on bone changes in the medial patellofemoral  compartments on both sides.  Right seems to be a little bit more involved  than the left radiographically.  Pain has been progressive over several  years.  He would like to proceed with surgery.  Risks and benefits of total  knee procedures have been discussed with the patient at length.  He elected  to proceed with bilateral knee replacements.   LABORATORY DATA:  CBC on admission:  Hemoglobin 12.9, hematocrit 37.0,  white  cell count 7.9, red cell count 4.17.  Postoperative H&H 10.6 and 29.7.  Continued to drop into 9.2 and 26.5.  Last noted 8.5 and 24.3.  Differential  on admission CBC within normal limits with exception elevated eos 8.  PT/PTT  on admission were 13.2 and 33, respectively with INR 1.0.  Serial pro times  followed.  Last noted PT/INR 18.2 and 1.6.  Chemistry panel on admission all  within normal limits.  Serial BMETs were followed.  Sodium dropped from 138  down to 131.  Glucose went up from 93 to 113.  Calcium dropped from 8.6. to  7.6.  Urinalysis on admission negative.  Follow-up UA:  Moderate hemoglobin,  trace leukocyte esterase, 0-2 red cells, rare bacteria.  Blood group type  O+.   EKG dated August 25, 2002:  Normal sinus rhythm.  Normal EKG.  No old tracing  to compare.  Confirmed by Vesta Mixer, M.D.  Chest x-ray dated August 25, 2002:  Chronic changes.  No active disease.   HOSPITAL COURSE:  The patient admitted to [facility].  Taken to  OR.  Underwent above stated procedure without complications.  The patient  tolerated procedure well.  Later transferred to recovery room, then to the  orthopedic floor for continued postoperative care.  Hemovac drains placed in  both knees at time of surgery pulled on postoperative day one.  The patient  underwent a rehabilitation consult by rehabilitation services and felt that  patient would be appropriate inpatient rehabilitation stay.  PT and OT were  consulted postoperatively to assist with gait training, ambulation, and  ADLs.  The patient was given vancomycin IV for 24 hours, placed on Coumadin  for DVT prophylaxis.  The patient did progress initially fairly slowly with  physical therapy.  He was up ambulating approximately 5 feet by  postoperative day one and 20 feet by postoperative day two but then he did  very well and up to 100 feet by postoperative day three.  Continued to  progress very well with physical therapy.   By day two dressing change was  initiated.  Incision was healing well.  He was initially placed on epidural  for postoperative pain.  The epidural was discontinued.  On postoperative  day two the Coumadin had been started later that evening and was titrated up  by pharmacy.  He did have some elevated temperatures postoperatively.  This  was treated with antipyretics and incentive spirometer.  Initially slow with  his therapy, but again, he did quite well and progressed with his mobility.  He did alternating CPMs for continuous passive motion during the hospital  course.  It was felt he would be appropriate candidate for rehabilitation.  He continued with physical therapy while waiting on a bed.  Incisions were  healing well.  It was noted later in the hospital course that a bed did  become available on September 07, 2002.  It was decided the patient would be  discharged over at that time.   DISCHARGE PLAN:  The patient transferred to [facility]  on September 07, 2002.   DISCHARGE DIAGNOSES:  Please see above.   DISCHARGE MEDICATIONS:  1. Percocet for pain.  2. Robaxin for spasm.  3. Coumadin for DVT prophylaxis.  4. Continue his current home medications.   DIET:  As tolerated.   ACTIVITY:  Full weightbearing.  Continue with gait training, ambulation,  ADLs per PT and OT while in rehabilitation for total knee protocol.   FOLLOWUP:  Two weeks from surgery or following the discharge from the  rehabilitation unit.   DISPOSITION:  [facility].   CONDITION ON DISCHARGE:  Improved.     Alexzandrew L. Julien Girt, P.A.              Ollen Gross, M.D.    ALP/MEDQ  D:  10/07/2002  T:  10/07/2002  Job:  [id number]   cc:   Laurita Quint, M.D.  [address] [place]  [place] [address]  Fax: [phone]

## 2010-07-21 NOTE — Op Note (Signed)
[name], [name]                           ACCOUNT NO.:  [id number]   MEDICAL RECORD NO.:  [id number]                   PATIENT TYPE:  INP   LOCATION:  X005                                 FACILITY[facility]  [facility]   PHYSICIAN:  Ollen Gross, M.D.                 DATE OF BIRTH:  15-Apr-1940   DATE OF PROCEDURE:  09/02/2002  DATE OF DISCHARGE:                                 OPERATIVE REPORT   PREOPERATIVE DIAGNOSIS:  Osteoarthritis, bilateral knees.   POSTOPERATIVE DIAGNOSIS:  Osteoarthritis, bilateral knees.   PROCEDURE:  Bilateral total knee arthroplasty.   SURGEON:  Ollen Gross, M.D.   ASSISTANT:  Alexzandrew L. Julien Girt, P.A.   ANESTHESIA:  Spinal/epidural.   ESTIMATED BLOOD LOSS:  Minimal.   DRAIN:  Hemovac x1 on each side.   TOURNIQUET TIME:  56 minutes at 300 mmHg on the right and 58 minutes at 300  mmHg on the left.   COMPLICATIONS:  None.   CONDITION:  Stable to the recovery room.   INDICATIONS FOR PROCEDURE:  The patient is a 70 year old male with severe  end-stage osteoarthritis of both knees, again refractory to non-operative  management.  He presents now for bilateral total knee arthroplasty.  We  discussed stage or simultaneous, and he elected to have both knees done at  the same setting.   DESCRIPTION OF PROCEDURE:  After the successful administration of  spinal/epidural anesthetic, a tourniquet was placed on both thighs and both  lower extremities were prepped and draped in the usual sterile fashion.  His  right side was more symptomatic, so we did that one first.  The right lower  extremity was wrapped in an Esmarch and the knee flexed, and the tourniquet  inflated to 300 mmHg.  A standard midline incision was made with the #10  blade to the level of the extensor mechanism.  A fresh blade was used to  make a medial parapatellar arthrotomy in the soft tissue, with the proximal  medial tibia subperiosteum elevated to the joint line with a knife and  entered the seromembranous bursa with a curved osteotome.  The soft tissue  over the proximal and lateral tibia was also elevated, with attention paid  to the patellar tendon on the tibial tubercle.  The patella was everted and  the knee flexed to 90 degrees.  The ACL and PCL removed.  A drill was used  to prepare a starting hole in the distal femur.  The canals were thoroughly  irrigated and a 5 degree right valgus alignment guide placed.  Referencing  at the posterior condyle, rotation marked with a black pen to remove 10 mm  off the distal femur.  The distal femoral resection is made with an  oscillating saw.  The sizing block is then placed, and size #5 is most  appropriate.  The rotation is marked up the epicondylar axis and a size #5  AP cutting block is placed.  The anterior and posterior cuts are  subsequently made.  The tibia is then subluxed forward and the menisci  removed.  Extramedullary tibial alignment guide is placed, referencing  proximally at the medial aspect of the tibial tubercle and distally along  the second metatarsal axis of the tibial crest.  Blocks attempted to remove  10 mm of the non-deficient lateral side.  The tibial resection is made with  an oscillating saw.  The size #5 is the most appropriate tibial component,  and then the proximal tibia is prepared using a modular drill and keel  punch.  A femoral preparation is completed with the inner condylar and  chamfer cuts.  Trial size #5 posterior stabilizer femoral component, size #5  __________ with tibial tray.  A 10 mm posterior stabilizer obtained with a  platform trial used.  Full extension was achieved with excellent varus and  valgus balance throughout.  A full range of motion.  The patella was then  everted again.  The thickness was measured to be 27 mm.  The resection taken  to 15 mm.  A #41 template placed.  The lug holes drilled and the trial  patella placed and tracks normally.  The osteophyte is then  removed off the  posterior femur with the trial in place.  All trials were then removed and  the PET bone surface repaired with pulsatile lavage.  The cement is mixed  and once ready for implantation is sized.  A 5 mg tibial tray, size #5  posterior stabilized femur and #41 patella are all  cemented into place and  the patella is held with a clamp.  A 10 mm trial is placed with the knee  held in full extension.  All excess cement removed.  Once the cement is  fully hardened, and the permanent 10 mm posterior stabilizer with rotating  platform insert is placed.  He once again had excellent stability throughout  a full range of motion.  The patella tracked normally.  The wound was  copiously irrigated with antibiotic solution, and the extensor mechanism  closed over a Hemovac drain with interrupted #1 PDS.  The tourniquet was  released with a total time of 56 minutes.  Minor bleeding was stopped with  cautery.  The flexion against gravity is approximately 135 degrees.  We then  closed the subcutaneous with interrupted #2-0 Vicryl, and placed a sterile  sponge there, and covered the leg with a sterile drape.  We then proceeded with the left lower extremity.  I wrapped the left lower  extremity in Esmarch, flexed the knee, and inflated the tourniquet to 300  mmHg.  The same approach is performed as with the right knee.  We did a  medial arthrotomy, elevated soft tissue over the proximal medial tibia and  everted the patella and flexed the knee to 90 degrees, and the ACL and PCL  removed.  A drill was used to create a starting hole in the distal femur.  The canal was thoroughly irrigated.  A 5-degree left valgus filament guide  was placed, and the block was pinned with 10 mm off the distal femur.  A  distal resection was made with an oscillating saw.  A sizing block is  placed, and size #5 was the most appropriate.  Again we rotated up the epicondylar axis.  A size #5 cutting block was placed,  and the anterior and  posterior cuts made.  The tibia subluxed  forward and menisci were removed.  Extramedullary tibial  guide was placed and referencing proximally at the medial aspect of the  tibial tubercle and distally along the second metatarsal axis of the tibial  crest.  This block is pinned and 10 mm were removed off the non-deficient  lateral side.  Once the tibial resection is made, the size #5 is placed and  the proximal tibia is prepared with a modular general keel punch.  The  femoral preparation is completed with the inner condylar and the chamfer  cuts.  Trial size #5, left posterior stabilized femur, size #5  __________tibial tray are placed with a 10 mm posterior stabilizing rotating  platform insert trial.  Full extension is again achieved with excellent  varus and valgus balance throughout a full range of motion.  The patella is  then prepared, going through a thickness of 27 mm, to resection down to 15  mm.  A #41 template placed and lug holes drilled, and a trial patella placed  and tracks normally.  Osteophytes then removed off of the posterior femur  with the trial in place.  The cut bone surfaces are prepared with pulsatile  lavage and cement mixed.  Once we had implantation, the size #5 mobile  bearing tibial tray, size #5 posterior stabilized femoral component, and the  #41 patella are cemented into place, and the patella is held with a clamp.  Trial 10-mm inserts placed with the knee held in full extension.  All excess  cement is then removed.  Once the cement is fully hardened, then the  permanent 10 mm posterior stabilizer with a rotating platform insert is  placed.  The wound is then copiously irrigated with antibiotic solution and  the knee extensor mechanism is closed over a Hemovac drain with interrupted  #1 PDS.  Flexion against gravity to 135 degrees.  The patella tracks  normally.  The tourniquet was released for a total time of 58 minutes.  The   subcutaneous was closed with interrupted #2-0 Vicryl, the subcuticular with  running #4-0 Monocryl.  We then did a subcuticular closure on the right  knee.  Both knees were then cleaned and dried, and  Steri-Strips and bulky sterile dressings applied.  The drains already hooked  to suction.  The knee was placed into a knee immobilizer.  The patient was transported to the recovery room in stable condition.                                                Ollen Gross, M.D.    FA/MEDQ  D:  09/02/2002  T:  09/02/2002  Job:  [id number]

## 2010-09-18 ENCOUNTER — Other Ambulatory Visit (INDEPENDENT_AMBULATORY_CARE_PROVIDER_SITE_OTHER): Payer: Medicare Other | Admitting: Family Medicine

## 2010-09-18 DIAGNOSIS — R972 Elevated prostate specific antigen [PSA]: Secondary | ICD-10-CM

## 2010-09-19 LAB — PSA, TOTAL AND FREE
PSA, Free Pct: 27 % (ref >25)
PSA, Free: 1.6 ng/mL
PSA: 5.88 ng/mL — ABNORMAL HIGH (ref <=4.00)

## 2010-09-20 ENCOUNTER — Encounter: Payer: Self-pay | Admitting: Family Medicine

## 2010-09-20 ENCOUNTER — Ambulatory Visit (INDEPENDENT_AMBULATORY_CARE_PROVIDER_SITE_OTHER): Payer: Medicare Other | Admitting: Family Medicine

## 2010-09-20 DIAGNOSIS — R972 Elevated prostate specific antigen [PSA]: Secondary | ICD-10-CM

## 2010-09-20 NOTE — Progress Notes (Signed)
  Subjective:    Patient ID: [name], male    DOB: [birth year], 70 y.o.   MRN: [id number]  HPI Pt here for three month followup of elevated PSA. He tolerated AB last time given for subtle prostatitis well.  He feels well and has no complaints.     Review of SystemsNoncontributory except as above.       Objective:   Physical Exam Nop exam. Discussed Lab results.        Assessment & Plan:

## 2010-09-20 NOTE — Patient Instructions (Addendum)
RTC 3 mos for recheck, PSA and free PSA prior. (elevated PSA)790.93

## 2010-09-20 NOTE — Assessment & Plan Note (Signed)
PSA has increased in 3 mos from 5.37 to 5.88, a rate grtr than 10% per year but his free % is 28%. Conflicting data that we will temporize on and repeat in three months. If PSA elevated, will refer to Urology. If same or less, will continue to follow.

## 2010-12-06 ENCOUNTER — Other Ambulatory Visit: Payer: Self-pay | Admitting: Family Medicine

## 2010-12-06 DIAGNOSIS — R972 Elevated prostate specific antigen [PSA]: Secondary | ICD-10-CM

## 2010-12-08 ENCOUNTER — Other Ambulatory Visit (INDEPENDENT_AMBULATORY_CARE_PROVIDER_SITE_OTHER): Payer: Medicare Other

## 2010-12-08 DIAGNOSIS — R972 Elevated prostate specific antigen [PSA]: Secondary | ICD-10-CM

## 2010-12-09 LAB — PSA, TOTAL AND FREE
PSA, Free Pct: 28 % (ref >25)
PSA, Free: 1.65 ng/mL
PSA: 5.86 ng/mL — ABNORMAL HIGH (ref <=4.00)

## 2010-12-13 ENCOUNTER — Ambulatory Visit (INDEPENDENT_AMBULATORY_CARE_PROVIDER_SITE_OTHER): Payer: Medicare Other | Admitting: Family Medicine

## 2010-12-13 ENCOUNTER — Encounter: Payer: Self-pay | Admitting: Family Medicine

## 2010-12-13 VITALS — BP 120/64 | HR 68 | Temp 97.9°F | Ht 69.0 in | Wt 241.8 lb

## 2010-12-13 DIAGNOSIS — R972 Elevated prostate specific antigen [PSA]: Secondary | ICD-10-CM

## 2010-12-13 NOTE — Progress Notes (Signed)
  Subjective:    Patient ID: [name], male    DOB: 1940/10/24, 70 y.o.   MRN: [id number]  HPI Pt here for followup of elevated PSA. He feels well and has no complaints.   Review of Systems Noncontributory except as above.      Objective:   Physical Exam  Constitutional: He appears well-developed and well-nourished. No distress.  HENT:  Head: Normocephalic and atraumatic.  Right Ear: External ear normal.  Left Ear: External ear normal.  Nose: Nose normal.  Mouth/Throat: Oropharynx is clear and moist.  Eyes: Conjunctivae and EOM are normal. Pupils are equal, round, and reactive to light. Right eye exhibits no discharge. Left eye exhibits no discharge.  Neck: Normal range of motion. Neck supple.  Cardiovascular: Normal rate and regular rhythm.   Pulmonary/Chest: Effort normal and breath sounds normal. He has no wheezes.  Lymphadenopathy:    He has no cervical adenopathy.  Skin: He is not diaphoretic.          Assessment & Plan:

## 2010-12-13 NOTE — Patient Instructions (Signed)
RTC 6 mos for recheck.  Labs prior.

## 2010-12-13 NOTE — Assessment & Plan Note (Signed)
Continue to follow nos. As long as stable, cont to  Monitor. Recheck in 6 mos.

## 2011-05-07 ENCOUNTER — Encounter: Payer: Self-pay | Admitting: Internal Medicine

## 2011-05-07 ENCOUNTER — Ambulatory Visit (INDEPENDENT_AMBULATORY_CARE_PROVIDER_SITE_OTHER): Payer: Medicare Other | Admitting: Internal Medicine

## 2011-05-07 VITALS — BP 120/58 | HR 85 | Temp 98.3°F | Ht 68.5 in | Wt 246.0 lb

## 2011-05-07 DIAGNOSIS — E785 Hyperlipidemia, unspecified: Secondary | ICD-10-CM

## 2011-05-07 DIAGNOSIS — R972 Elevated prostate specific antigen [PSA]: Secondary | ICD-10-CM

## 2011-05-07 DIAGNOSIS — I10 Essential (primary) hypertension: Secondary | ICD-10-CM

## 2011-05-07 NOTE — Progress Notes (Signed)
Subjective:    Patient ID: [name], male    DOB: [birth year], 71 y.o.   MRN: [id number]  HPI 70YO male with h/o hypertension and elevated PSA presents to establish care at our office.  He reports he is doing well. He has no concerns today. In regards to his HTN, he notes that he occasionally misses a dose of his medication.  He did not bring record of BP today.  He denies any chest pain, palpitations, or dyspnea.  Outpatient Encounter Prescriptions as of 05/07/2011  Medication Sig Dispense Refill  . acyclovir (ZOVIRAX) 5 % ointment Apply topically. As needed for cold sores       . benazepril-hydrochlorthiazide (LOTENSIN HCT) 20-12.5 MG per tablet TAKE 1 TABLET DAILY  90 tablet  3  . clindamycin (CLEOCIN) 300 MG capsule Take 300 mg by mouth as needed. Prior to dental procedures      . fluocinonide (LIDEX) 0.05 % cream Apply topically daily.        . Methylcellulose, Laxative, (CITRUCEL) 500 MG TABS Take by mouth daily.        . Multiple Vitamins-Minerals ([employer] SPECTRAVITE SENIOR) TABS Take by mouth. 1 by mouth every am       . Omega-3 Fatty Acids (THERAGRAN-M FISH OIL CONC) 1200 MG CAPS Take by mouth daily.        . Sildenafil Citrate (VIAGRA PO) Take by mouth. As needed         Review of Systems  Constitutional: Negative for fever, chills, activity change, appetite change, fatigue and unexpected weight change.  Eyes: Negative for visual disturbance.  Respiratory: Negative for cough and shortness of breath.   Cardiovascular: Negative for chest pain, palpitations and leg swelling.  Gastrointestinal: Negative for abdominal pain and abdominal distention.  Genitourinary: Negative for dysuria, urgency and difficulty urinating.  Musculoskeletal: Negative for arthralgias and gait problem.  Skin: Negative for color change and rash.  Hematological: Negative for adenopathy.  Psychiatric/Behavioral: Negative for sleep disturbance and dysphoric mood. The patient is not nervous/anxious.    BP  120/58  Pulse 85  Temp(Src) 98.3 F (36.8 C) (Oral)  Ht 5' 8.5" (1.74 m)  Wt 246 lb (111.585 kg)  BMI 36.86 kg/m2  SpO2 95%     Objective:   Physical Exam  Constitutional: He is oriented to person, place, and time. He appears well-developed and well-nourished. No distress.  HENT:  Head: Normocephalic and atraumatic.  Right Ear: External ear normal.  Left Ear: External ear normal.  Nose: Nose normal.  Mouth/Throat: Oropharynx is clear and moist. No oropharyngeal exudate.  Eyes: Conjunctivae and EOM are normal. Pupils are equal, round, and reactive to light. Right eye exhibits no discharge. Left eye exhibits no discharge. No scleral icterus.  Neck: Normal range of motion. Neck supple. No tracheal deviation present. No thyromegaly present.  Cardiovascular: Normal rate, regular rhythm and normal heart sounds.  Exam reveals no gallop and no friction rub.   No murmur heard. Pulmonary/Chest: Effort normal and breath sounds normal. No respiratory distress. He has no wheezes. He has no rales. He exhibits no tenderness.  Abdominal: Soft. Bowel sounds are normal. He exhibits no distension and no mass. There is no tenderness. There is no rebound and no guarding.  Musculoskeletal: Normal range of motion. He exhibits no edema.  Lymphadenopathy:    He has no cervical adenopathy.  Neurological: He is alert and oriented to person, place, and time. No cranial nerve deficit. Coordination normal.  Skin: Skin is warm and  dry. No rash noted. He is not diaphoretic. No erythema. No pallor.  Psychiatric: He has a normal mood and affect. His behavior is normal. Judgment and thought content normal.          Assessment & Plan:

## 2011-05-07 NOTE — Assessment & Plan Note (Signed)
Elevated, but stable. Will recheck free and total today.  Follow up 6 months.

## 2011-05-07 NOTE — Assessment & Plan Note (Signed)
BP well controlled today. Will check renal function with labs. Continue current meds. Follow up in 6 months.

## 2011-05-08 LAB — CBC WITH DIFFERENTIAL/PLATELET
Basophils Absolute: 0 10*3/uL (ref 0.0–0.1)
Eosinophils Absolute: 0.4 10*3/uL (ref 0.0–0.7)
Lymphocytes Relative: 28.6 % (ref 12.0–46.0)
MCHC: 33.9 g/dL (ref 30.0–36.0)
Monocytes Relative: 7.4 % (ref 3.0–12.0)
Neutro Abs: 4.3 10*3/uL (ref 1.4–7.7)
Neutrophils Relative %: 58.2 % (ref 43.0–77.0)
Platelets: 211 10*3/uL (ref 150.0–400.0)
RDW: 13.7 % (ref 11.5–14.6)

## 2011-05-08 LAB — LIPID PANEL: HDL: 32.4 mg/dL — ABNORMAL LOW (ref >39.00)

## 2011-05-08 LAB — COMPREHENSIVE METABOLIC PANEL
ALT: 28 U/L (ref 0–53)
AST: 31 U/L (ref 0–37)
Albumin: 4.1 g/dL (ref 3.5–5.2)
CO2: 26 mEq/L (ref 19–32)
Calcium: 9.2 mg/dL (ref 8.4–10.5)
Chloride: 96 mEq/L (ref 96–112)
Creatinine, Ser: 1.3 mg/dL (ref 0.4–1.5)
GFR: 56.24 mL/min — ABNORMAL LOW (ref >60.00)
Potassium: 3.8 mEq/L (ref 3.5–5.1)
Total Protein: 7.5 g/dL (ref 6.0–8.3)

## 2011-05-08 LAB — PSA, TOTAL AND FREE
PSA, Free Pct: 30 % (ref >25)
PSA, Free: 1.53 ng/mL
PSA: 5.04 ng/mL — ABNORMAL HIGH (ref <=4.00)

## 2011-05-08 LAB — LDL CHOLESTEROL, DIRECT: Direct LDL: 104.5 mg/dL

## 2011-06-07 ENCOUNTER — Other Ambulatory Visit: Payer: Medicare Other

## 2011-06-13 ENCOUNTER — Ambulatory Visit: Payer: Medicare Other | Admitting: Family Medicine

## 2011-07-15 ENCOUNTER — Other Ambulatory Visit: Payer: Self-pay | Admitting: Family Medicine

## 2011-11-14 ENCOUNTER — Encounter: Payer: Self-pay | Admitting: Internal Medicine

## 2011-11-14 ENCOUNTER — Ambulatory Visit (INDEPENDENT_AMBULATORY_CARE_PROVIDER_SITE_OTHER): Payer: Medicare Other | Admitting: Internal Medicine

## 2011-11-14 VITALS — BP 130/80 | HR 60 | Temp 97.8°F | Ht 68.5 in | Wt 247.5 lb

## 2011-11-14 DIAGNOSIS — R972 Elevated prostate specific antigen [PSA]: Secondary | ICD-10-CM

## 2011-11-14 DIAGNOSIS — I10 Essential (primary) hypertension: Secondary | ICD-10-CM

## 2011-11-14 DIAGNOSIS — L989 Disorder of the skin and subcutaneous tissue, unspecified: Secondary | ICD-10-CM

## 2011-11-14 LAB — COMPREHENSIVE METABOLIC PANEL
AST: 31 U/L (ref 0–37)
BUN: 19 mg/dL (ref 6–23)
Calcium: 9 mg/dL (ref 8.4–10.5)
Chloride: 102 mEq/L (ref 96–112)
Creatinine, Ser: 1.2 mg/dL (ref 0.4–1.5)
GFR: 62.04 mL/min (ref >60.00)
Total Bilirubin: 0.5 mg/dL (ref 0.3–1.2)

## 2011-11-14 NOTE — Assessment & Plan Note (Signed)
Will repeat PSA today.

## 2011-11-14 NOTE — Assessment & Plan Note (Signed)
Right lateral foot lesion most consistent with plantar wart. However it did not improve with debridement. Question if there may be underlying bone spur. Will set up with podiatry for evaluation.

## 2011-11-14 NOTE — Assessment & Plan Note (Signed)
BP well controlled. Will check renal function and electrolytes with labs today.

## 2011-11-14 NOTE — Progress Notes (Signed)
Subjective:    Patient ID: [name], male    DOB: [birth year], 71 y.o.   MRN: [id number]  HPI 71 year old male with history of hypertension presents for followup. He reports he has been feeling well. He reports he has been active over the last 6 months. He denies any chest pain, palpitations, headache. He reports full compliance with his medication. His only concern today is lesion on his right lateral foot which has been present for about 3 months. He reports that he initially thought this was a plantars wart and tried to debride it himself. He reports some material came out of the wound it looked like pieces of bone. The wound has healed with overlying callus however is still uncomfortable. He denies any surrounding redness, fever, chills. He denies any trauma to his foot.  Outpatient Encounter Prescriptions as of 11/14/2011  Medication Sig Dispense Refill  . acyclovir (ZOVIRAX) 5 % ointment Apply topically. As needed for cold sores       . aspirin 81 MG tablet Take 81 mg by mouth daily.      . benazepril-hydrochlorthiazide (LOTENSIN HCT) 20-12.5 MG per tablet TAKE 1 TABLET DAILY  90 tablet  2  . clindamycin (CLEOCIN) 300 MG capsule Take 300 mg by mouth as needed. Prior to dental procedures      . fluocinonide (LIDEX) 0.05 % cream Apply topically daily.        . Methylcellulose, Laxative, (CITRUCEL) 500 MG TABS Take by mouth daily.        . Multiple Vitamins-Minerals ([employer] SPECTRAVITE SENIOR) TABS Take by mouth. 1 by mouth every am       . Omega-3 Fatty Acids (THERAGRAN-M FISH OIL CONC) 1200 MG CAPS Take by mouth daily.        . Sildenafil Citrate (VIAGRA PO) Take by mouth. As needed        BP 130/80  Pulse 60  Temp 97.8 F (36.6 C) (Oral)  Ht 5' 8.5" (1.74 m)  Wt 247 lb 8 oz (112.265 kg)  BMI 37.08 kg/m2  SpO2 98%   Review of Systems  Constitutional: Negative for fever, chills, activity change, appetite change, fatigue and unexpected weight change.  Eyes: Negative for visual  disturbance.  Respiratory: Negative for cough and shortness of breath.   Cardiovascular: Negative for chest pain, palpitations and leg swelling.  Gastrointestinal: Negative for abdominal pain and abdominal distention.  Genitourinary: Negative for dysuria, urgency and difficulty urinating.  Musculoskeletal: Negative for arthralgias and gait problem.  Skin: Negative for color change and rash.  Hematological: Negative for adenopathy.  Psychiatric/Behavioral: Negative for disturbed wake/sleep cycle and dysphoric mood. The patient is not nervous/anxious.        Objective:   Physical Exam  Constitutional: He is oriented to person, place, and time. He appears well-developed and well-nourished. No distress.  HENT:  Head: Normocephalic and atraumatic.  Right Ear: External ear normal.  Left Ear: External ear normal.  Nose: Nose normal.  Mouth/Throat: Oropharynx is clear and moist. No oropharyngeal exudate.  Eyes: Conjunctivae normal and EOM are normal. Pupils are equal, round, and reactive to light. Right eye exhibits no discharge. Left eye exhibits no discharge. No scleral icterus.  Neck: Normal range of motion. Neck supple. No tracheal deviation present. No thyromegaly present.  Cardiovascular: Normal rate, regular rhythm and normal heart sounds.  Exam reveals no gallop and no friction rub.   No murmur heard. Pulmonary/Chest: Effort normal and breath sounds normal. No respiratory distress. He has no wheezes.  He has no rales. He exhibits no tenderness.  Musculoskeletal: Normal range of motion. He exhibits no edema.       Right foot: He exhibits tenderness.       Feet:  Lymphadenopathy:    He has no cervical adenopathy.  Neurological: He is alert and oriented to person, place, and time. No cranial nerve deficit. Coordination normal.  Skin: Skin is warm and dry. No rash noted. He is not diaphoretic. No erythema. No pallor.  Psychiatric: He has a normal mood and affect. His behavior is normal.  Judgment and thought content normal.          Assessment & Plan:

## 2011-11-15 LAB — PSA, TOTAL AND FREE
PSA, Free Pct: 31 % (ref >25)
PSA, Free: 1.44 ng/mL

## 2012-03-20 ENCOUNTER — Other Ambulatory Visit: Payer: Self-pay | Admitting: Internal Medicine

## 2012-03-20 MED ORDER — BENAZEPRIL-HYDROCHLOROTHIAZIDE 20-12.5 MG PO TABS: 1.0000 | ORAL_TABLET | Freq: Every day | ORAL | Status: DC

## 2012-03-20 NOTE — Telephone Encounter (Signed)
Pt came in needing refill  Pt stated he has new pharmacy [facility] [address]    Benazepril/hctz tabs  20/12.22m 745 generice for lotensin hct tabs Take 1 tablet daily Pt would like to get 90 day supply

## 2012-04-19 ENCOUNTER — Other Ambulatory Visit: Payer: Self-pay

## 2012-05-13 ENCOUNTER — Encounter: Payer: Medicare Other | Admitting: Internal Medicine

## 2012-06-23 ENCOUNTER — Telehealth: Payer: Self-pay | Admitting: Internal Medicine

## 2012-06-23 ENCOUNTER — Ambulatory Visit (INDEPENDENT_AMBULATORY_CARE_PROVIDER_SITE_OTHER): Payer: Medicare Other | Admitting: Internal Medicine

## 2012-06-23 ENCOUNTER — Encounter: Payer: Self-pay | Admitting: Internal Medicine

## 2012-06-23 VITALS — BP 130/72 | HR 73 | Temp 98.5°F | Ht 68.75 in | Wt 240.0 lb

## 2012-06-23 DIAGNOSIS — Z0000 Encounter for general adult medical examination without abnormal findings: Secondary | ICD-10-CM

## 2012-06-23 DIAGNOSIS — E785 Hyperlipidemia, unspecified: Secondary | ICD-10-CM

## 2012-06-23 DIAGNOSIS — M109 Gout, unspecified: Secondary | ICD-10-CM

## 2012-06-23 DIAGNOSIS — K5730 Diverticulosis of large intestine without perforation or abscess without bleeding: Secondary | ICD-10-CM

## 2012-06-23 DIAGNOSIS — I10 Essential (primary) hypertension: Secondary | ICD-10-CM

## 2012-06-23 DIAGNOSIS — R7989 Other specified abnormal findings of blood chemistry: Secondary | ICD-10-CM

## 2012-06-23 DIAGNOSIS — R972 Elevated prostate specific antigen [PSA]: Secondary | ICD-10-CM

## 2012-06-23 LAB — LIPID PANEL
Cholesterol: 167 mg/dL (ref 0–200)
HDL: 29.3 mg/dL — ABNORMAL LOW (ref >39.00)
VLDL: 24 mg/dL (ref 0.0–40.0)

## 2012-06-23 LAB — CBC WITH DIFFERENTIAL/PLATELET
Eosinophils Relative: 3.7 % (ref 0.0–5.0)
HCT: 41.5 % (ref 39.0–52.0)
Lymphs Abs: 2.2 10*3/uL (ref 0.7–4.0)
MCV: 90.3 fl (ref 78.0–100.0)
Monocytes Absolute: 0.7 10*3/uL (ref 0.1–1.0)
Neutro Abs: 4.5 10*3/uL (ref 1.4–7.7)
Platelets: 204 10*3/uL (ref 150.0–400.0)
RDW: 13.2 % (ref 11.5–14.6)
WBC: 7.8 10*3/uL (ref 4.5–10.5)

## 2012-06-23 LAB — COMPREHENSIVE METABOLIC PANEL
CO2: 31 mEq/L (ref 19–32)
Creatinine, Ser: 1.4 mg/dL (ref 0.4–1.5)
GFR: 54.18 mL/min — ABNORMAL LOW (ref >60.00)
Glucose, Bld: 78 mg/dL (ref 70–99)
Total Bilirubin: 0.9 mg/dL (ref 0.3–1.2)

## 2012-06-23 MED ORDER — COLCHICINE 0.6 MG PO TABS: 0.6000 mg | ORAL_TABLET | Freq: Every day | ORAL | Status: DC | PRN

## 2012-06-23 NOTE — Assessment & Plan Note (Signed)
General medical exam normal today. Appropriate screening performed. Health maintenance UTD. Will check CBC, CMP, lipids, PSA with labs today. Discussed limitations of PSA testing as screening for prostate cancer.

## 2012-06-23 NOTE — Telephone Encounter (Signed)
Flu Swab

## 2012-06-23 NOTE — Progress Notes (Signed)
Subjective:    Patient ID: [name], male    DOB: [birth year], 72 y.o.   MRN: [id number]  HPI The patient is here for annual Medicare wellness examination and management of other chronic and acute problems.   The risk factors are reflected in the social history.  The roster of all physicians providing medical care to patient - is listed in the Snapshot section of the chart.  Activities of daily living:  The patient is 100% independent in all ADLs: dressing, toileting, feeding as well as independent mobility  Home safety : The patient has smoke detectors in the home. They wear seatbelts.  There are locked firearms at home. There is no violence in the home.   There is no risks for hepatitis, STDs or HIV. There is no history of blood transfusion. They have no travel history to infectious disease endemic areas of the world.  The patient has seen their dentist in the last six month. (Dr. Desma Maxim) They have seen their eye doctor in the last year. (Dr. Alinda Money) Hearing testing in the past, was normal.  They have deferred audiologic testing in the last year.   They do not  have excessive sun exposure. Discussed the need for sun protection: hats, long sleeves and use of sunscreen if there is significant sun exposure. (Dr. Adolphus Birchwood)  Diet: the importance of a healthy diet is discussed. They do have a healthy diet.  The benefits of regular aerobic exercise were discussed.  Exercises occasionally, very active at work walks 5-67miles per day.  Depression screen: there are no signs or vegative symptoms of depression- irritability, change in appetite, anhedonia, sadness/tearfullness. Issues with depression in the past, none recently.  Cognitive assessment: the patient manages all their financial and personal affairs and is actively engaged. They could relate day,date,year and events.  HCPOA - Wife then Daughter - Bari Handshoe [phone]  The following portions of the patient's history were  reviewed and updated as appropriate: allergies, current medications, past family history, past medical history,  past surgical history, past social history  and problem list.  Visual acuity was not assessed per patient preference since he has regular follow up with her ophthalmologist. Hearing and body mass index were assessed and reviewed.   During the course of the visit the patient was educated and counseled about appropriate screening and preventive services including : fall prevention , diabetes screening, nutrition counseling, colorectal cancer screening, and recommended immunizations.     Outpatient Encounter Prescriptions as of 06/23/2012  Medication Sig Dispense Refill  . acyclovir (ZOVIRAX) 5 % ointment Apply topically. As needed for cold sores       . aspirin 81 MG tablet Take 81 mg by mouth daily.      . benazepril-hydrochlorthiazide (LOTENSIN HCT) 20-12.5 MG per tablet Take 1 tablet by mouth daily.  90 tablet  3  . clindamycin (CLEOCIN) 300 MG capsule Take 300 mg by mouth as needed. Prior to dental procedures      . colchicine (COLCRYS) 0.6 MG tablet Take 1 tablet (0.6 mg total) by mouth daily as needed (When having a gout flare up).  30 tablet  3  . fluocinonide (LIDEX) 0.05 % cream Apply topically daily.        . Methylcellulose, Laxative, (CITRUCEL) 500 MG TABS Take by mouth daily.        . Multiple Vitamins-Minerals ([employer] SPECTRAVITE SENIOR) TABS Take by mouth. 1 by mouth every am       . Omega-3 Fatty Acids (  THERAGRAN-M FISH OIL CONC) 1200 MG CAPS Take by mouth daily.        . [DISCONTINUED] colchicine (COLCRYS) 0.6 MG tablet Take 0.6 mg by mouth daily as needed (When having a gout flare up).      . Sildenafil Citrate (VIAGRA PO) Take by mouth. As needed        No facility-administered encounter medications on file as of 06/23/2012.   BP 130/72  Pulse 73  Temp(Src) 98.5 F (36.9 C) (Oral)  Ht 5' 8.75" (1.746 m)  Wt 240 lb (108.863 kg)  BMI 35.71 kg/m2  SpO2 96%  Review  of Systems  Constitutional: Negative for fever, chills, activity change, appetite change, fatigue and unexpected weight change.  Eyes: Negative for visual disturbance.  Respiratory: Negative for cough and shortness of breath.   Cardiovascular: Negative for chest pain, palpitations and leg swelling.  Gastrointestinal: Negative for abdominal pain and abdominal distention.  Genitourinary: Negative for dysuria, urgency and difficulty urinating.  Musculoskeletal: Negative for arthralgias and gait problem.  Skin: Negative for color change and rash.  Hematological: Negative for adenopathy.  Psychiatric/Behavioral: Negative for sleep disturbance and dysphoric mood. The patient is not nervous/anxious.        Objective:   Physical Exam  Constitutional: He is oriented to person, place, and time. He appears well-developed and well-nourished. No distress.  HENT:  Head: Normocephalic and atraumatic.  Right Ear: External ear normal.  Left Ear: External ear normal.  Nose: Nose normal.  Mouth/Throat: Oropharynx is clear and moist. No oropharyngeal exudate.  Eyes: Conjunctivae and EOM are normal. Pupils are equal, round, and reactive to light. Right eye exhibits no discharge. Left eye exhibits no discharge. No scleral icterus.  Neck: Normal range of motion. Neck supple. No tracheal deviation present. No thyromegaly present.  Cardiovascular: Normal rate, regular rhythm and normal heart sounds.  Exam reveals no gallop and no friction rub.   No murmur heard. Pulmonary/Chest: Effort normal and breath sounds normal. No accessory muscle usage. Not tachypneic. No respiratory distress. He has no decreased breath sounds. He has no wheezes. He has no rhonchi. He has no rales. He exhibits no tenderness.  Abdominal: Soft. Bowel sounds are normal. He exhibits no distension. There is no tenderness. There is no rebound and no guarding.  Musculoskeletal: Normal range of motion. He exhibits no edema.  Lymphadenopathy:     He has no cervical adenopathy.  Neurological: He is alert and oriented to person, place, and time. No cranial nerve deficit. Coordination normal.  Skin: Skin is warm and dry. No rash noted. He is not diaphoretic. No erythema. No pallor.  Psychiatric: He has a normal mood and affect. His behavior is normal. Judgment and thought content normal.          Assessment & Plan:

## 2012-12-15 ENCOUNTER — Encounter: Payer: Self-pay | Admitting: Internal Medicine

## 2012-12-15 ENCOUNTER — Ambulatory Visit (INDEPENDENT_AMBULATORY_CARE_PROVIDER_SITE_OTHER): Payer: Medicare Other | Admitting: Internal Medicine

## 2012-12-15 VITALS — BP 130/88 | HR 60 | Temp 98.5°F | Wt 242.0 lb

## 2012-12-15 DIAGNOSIS — A048 Other specified bacterial intestinal infections: Secondary | ICD-10-CM

## 2012-12-15 DIAGNOSIS — Z23 Encounter for immunization: Secondary | ICD-10-CM

## 2012-12-15 DIAGNOSIS — I10 Essential (primary) hypertension: Secondary | ICD-10-CM

## 2012-12-15 DIAGNOSIS — K209 Esophagitis, unspecified without bleeding: Secondary | ICD-10-CM

## 2012-12-15 LAB — COMPREHENSIVE METABOLIC PANEL
Albumin: 4.1 g/dL (ref 3.5–5.2)
CO2: 27 mEq/L (ref 19–32)
Calcium: 9.2 mg/dL (ref 8.4–10.5)
Chloride: 100 mEq/L (ref 96–112)
GFR: 55.99 mL/min — ABNORMAL LOW (ref >60.00)
Glucose, Bld: 83 mg/dL (ref 70–99)
Sodium: 137 mEq/L (ref 135–145)
Total Bilirubin: 0.8 mg/dL (ref 0.3–1.2)
Total Protein: 7.7 g/dL (ref 6.0–8.3)

## 2012-12-15 MED ORDER — PANTOPRAZOLE SODIUM 40 MG PO TBEC: 40.0000 mg | DELAYED_RELEASE_TABLET | Freq: Two times a day (BID) | ORAL | Status: DC

## 2012-12-15 NOTE — Progress Notes (Signed)
Subjective:    Patient ID: [name], male    DOB: 1940/04/06, 72 y.o.   MRN: [id number]  HPI 72 year old male with history of hypertension presents for followup. His primary concern today is recent episodes of epigastric and chest pain, belching, nausea and vomiting. In 2008, he underwent upper endoscopy and esophageal dilation. He reports that soon after completing that procedure he had recurrent symptoms with occasional difficulty swallowing. Food tends to get stuck in his mid chest, particularly dry foods such as corn bread or rice. Over the last several years he has had intermittent episodes of nausea and vomiting during eating. He prefers not to have repeat endoscopy because of minimal improvement with last esophageal dilation. He is no longer taking any PPI. Last Tuesday, he had a severe episode with significant chest pain which was alleviated with belching and vomiting. The pain has now subsided. He denies any hematemesis or change in bowel habits. He does not have any difficulty swallowing liquids.  In regards to hypertension, blood pressure has been well-controlled. He denies any headache, palpitations.  Outpatient Encounter Prescriptions as of 12/15/2012  Medication Sig Dispense Refill  . acyclovir (ZOVIRAX) 5 % ointment Apply topically. As needed for cold sores       . aspirin 81 MG tablet Take 81 mg by mouth daily.      . benazepril-hydrochlorthiazide (LOTENSIN HCT) 20-12.5 MG per tablet Take 1 tablet by mouth daily.  90 tablet  3  . clindamycin (CLEOCIN) 300 MG capsule Take 300 mg by mouth as needed. Prior to dental procedures      . colchicine (COLCRYS) 0.6 MG tablet Take 1 tablet (0.6 mg total) by mouth daily as needed (When having a gout flare up).  30 tablet  3  . fluocinonide (LIDEX) 0.05 % cream Apply topically daily.        . Multiple Vitamins-Minerals ([employer] SPECTRAVITE SENIOR) TABS Take by mouth. 1 by mouth every am       . Omega-3 Fatty Acids (THERAGRAN-M FISH OIL CONC)  1200 MG CAPS Take by mouth daily.        . Sildenafil Citrate (VIAGRA PO) Take by mouth. As needed       . Methylcellulose, Laxative, (CITRUCEL) 500 MG TABS Take by mouth daily.        . pantoprazole (PROTONIX) 40 MG tablet Take 1 tablet (40 mg total) by mouth 2 (two) times daily.  60 tablet  3   No facility-administered encounter medications on file as of 12/15/2012.   BP 130/88  Pulse 60  Temp(Src) 98.5 F (36.9 C) (Oral)  Wt 242 lb (109.77 kg)  BMI 36.01 kg/m2  SpO2 98%  Review of Systems  Constitutional: Negative for fever, chills, activity change, appetite change, fatigue and unexpected weight change.  HENT: Positive for trouble swallowing.   Eyes: Negative for visual disturbance.  Respiratory: Negative for cough and shortness of breath.   Cardiovascular: Positive for chest pain. Negative for palpitations and leg swelling.  Gastrointestinal: Positive for nausea, vomiting and abdominal pain. Negative for abdominal distention.  Genitourinary: Negative for dysuria, urgency and difficulty urinating.  Musculoskeletal: Negative for arthralgias and gait problem.  Skin: Negative for color change and rash.  Hematological: Negative for adenopathy.  Psychiatric/Behavioral: Negative for sleep disturbance and dysphoric mood. The patient is not nervous/anxious.        Objective:   Physical Exam  Constitutional: He is oriented to person, place, and time. He appears well-developed and well-nourished. No distress.  HENT:  Head: Normocephalic and atraumatic.  Right Ear: External ear normal.  Left Ear: External ear normal.  Nose: Nose normal.  Mouth/Throat: Oropharynx is clear and moist. No oropharyngeal exudate.  Eyes: Conjunctivae and EOM are normal. Pupils are equal, round, and reactive to light. Right eye exhibits no discharge. Left eye exhibits no discharge. No scleral icterus.  Neck: Normal range of motion. Neck supple. No tracheal deviation present. No thyromegaly present.   Cardiovascular: Normal rate, regular rhythm and normal heart sounds.  Exam reveals no gallop and no friction rub.   No murmur heard. Pulmonary/Chest: Effort normal and breath sounds normal. No respiratory distress. He has no wheezes. He has no rales. He exhibits no tenderness.  Abdominal: Soft. Bowel sounds are normal. He exhibits no distension. There is no tenderness.  Musculoskeletal: Normal range of motion. He exhibits no edema.  Lymphadenopathy:    He has no cervical adenopathy.  Neurological: He is alert and oriented to person, place, and time. No cranial nerve deficit. Coordination normal.  Skin: Skin is warm and dry. No rash noted. He is not diaphoretic. No erythema. No pallor.  Psychiatric: He has a normal mood and affect. His behavior is normal. Judgment and thought content normal.          Assessment & Plan:

## 2012-12-15 NOTE — Assessment & Plan Note (Signed)
BP Readings from Last 3 Encounters:  12/15/12 130/88  06/23/12 130/72  11/14/11 130/80   BP well controlled on current medication. Will continue. Will check renal function with labs.

## 2012-12-15 NOTE — Assessment & Plan Note (Signed)
Symptoms and exam are consistent with acute esophagitis and likely esophageal narrowing. Patient declines referral back to GI for upper endoscopy. Will try to restartting pantoprazole 40 mg twice daily. We'll also check for H. pylori with labs. If no improvement over the next week, we discussed need for upper endoscopy for further evaluation.

## 2012-12-16 LAB — H. PYLORI BREATH TEST: H. pylori Breath Test: POSITIVE — AB

## 2012-12-16 MED ORDER — METRONIDAZOLE 500 MG PO TABS: 500.0000 mg | ORAL_TABLET | Freq: Two times a day (BID) | ORAL | Status: DC

## 2012-12-16 MED ORDER — CLARITHROMYCIN 500 MG PO TABS: 500.0000 mg | ORAL_TABLET | Freq: Two times a day (BID) | ORAL | Status: DC

## 2012-12-16 NOTE — Addendum Note (Signed)
Addended by: Ronna Polio A on: 12/16/2012 11:38 AM   Modules accepted: Orders

## 2013-04-02 ENCOUNTER — Other Ambulatory Visit: Payer: Self-pay | Admitting: Internal Medicine

## 2013-05-05 ENCOUNTER — Other Ambulatory Visit: Payer: Self-pay | Admitting: Internal Medicine

## 2013-06-30 ENCOUNTER — Ambulatory Visit (INDEPENDENT_AMBULATORY_CARE_PROVIDER_SITE_OTHER): Payer: Medicare Other | Admitting: Internal Medicine

## 2013-06-30 ENCOUNTER — Encounter: Payer: Self-pay | Admitting: Internal Medicine

## 2013-06-30 VITALS — BP 132/68 | HR 75 | Temp 98.1°F | Ht 69.7 in | Wt 249.0 lb

## 2013-06-30 DIAGNOSIS — Z0000 Encounter for general adult medical examination without abnormal findings: Secondary | ICD-10-CM

## 2013-06-30 DIAGNOSIS — R972 Elevated prostate specific antigen [PSA]: Secondary | ICD-10-CM

## 2013-06-30 DIAGNOSIS — Z23 Encounter for immunization: Secondary | ICD-10-CM

## 2013-06-30 DIAGNOSIS — E785 Hyperlipidemia, unspecified: Secondary | ICD-10-CM

## 2013-06-30 LAB — COMPREHENSIVE METABOLIC PANEL
ALBUMIN: 4 g/dL (ref 3.5–5.2)
ALT: 30 U/L (ref 0–53)
AST: 30 U/L (ref 0–37)
Alkaline Phosphatase: 61 U/L (ref 39–117)
BUN: 19 mg/dL (ref 6–23)
CALCIUM: 9.4 mg/dL (ref 8.4–10.5)
CHLORIDE: 102 meq/L (ref 96–112)
CO2: 24 meq/L (ref 19–32)
Creatinine, Ser: 1.3 mg/dL (ref 0.4–1.5)
GFR: 56.89 mL/min — AB (ref >60.00)
GLUCOSE: 104 mg/dL — AB (ref 70–99)
Potassium: 4.5 mEq/L (ref 3.5–5.1)
SODIUM: 137 meq/L (ref 135–145)
TOTAL PROTEIN: 7.3 g/dL (ref 6.0–8.3)
Total Bilirubin: 0.7 mg/dL (ref 0.3–1.2)

## 2013-06-30 LAB — CBC WITH DIFFERENTIAL/PLATELET
Basophils Absolute: 0 10*3/uL (ref 0.0–0.1)
Basophils Relative: 0.1 % (ref 0.0–3.0)
EOS PCT: 9.8 % — AB (ref 0.0–5.0)
Eosinophils Absolute: 0.8 10*3/uL — ABNORMAL HIGH (ref 0.0–0.7)
HCT: 42.5 % (ref 39.0–52.0)
HEMOGLOBIN: 14.4 g/dL (ref 13.0–17.0)
LYMPHS PCT: 28.7 % (ref 12.0–46.0)
Lymphs Abs: 2.4 10*3/uL (ref 0.7–4.0)
MCHC: 34 g/dL (ref 30.0–36.0)
MCV: 91.3 fl (ref 78.0–100.0)
MONOS PCT: 7.2 % (ref 3.0–12.0)
Monocytes Absolute: 0.6 10*3/uL (ref 0.1–1.0)
Neutro Abs: 4.5 10*3/uL (ref 1.4–7.7)
Neutrophils Relative %: 54.2 % (ref 43.0–77.0)
Platelets: 226 10*3/uL (ref 150.0–400.0)
RBC: 4.66 Mil/uL (ref 4.22–5.81)
RDW: 13.9 % (ref 11.5–14.6)
WBC: 8.3 10*3/uL (ref 4.5–10.5)

## 2013-06-30 LAB — LIPID PANEL
Cholesterol: 167 mg/dL (ref 0–200)
HDL: 30.3 mg/dL — ABNORMAL LOW (ref >39.00)
LDL Cholesterol: 91 mg/dL (ref 0–99)
Total CHOL/HDL Ratio: 6
Triglycerides: 231 mg/dL — ABNORMAL HIGH (ref 0.0–149.0)
VLDL: 46.2 mg/dL — ABNORMAL HIGH (ref 0.0–40.0)

## 2013-06-30 LAB — MICROALBUMIN / CREATININE URINE RATIO
CREATININE, U: 108.8 mg/dL
MICROALB UR: 0.3 mg/dL (ref 0.0–1.9)
MICROALB/CREAT RATIO: 0.3 mg/g (ref 0.0–30.0)

## 2013-06-30 LAB — PSA, MEDICARE: PSA: 7.5 ng/mL — AB (ref 0.10–4.00)

## 2013-06-30 MED ORDER — ACYCLOVIR 5 % EX OINT: TOPICAL_OINTMENT | CUTANEOUS | Status: DC

## 2013-06-30 NOTE — Assessment & Plan Note (Signed)
General medical exam normal today. Appropriate screening performed. Health maintenance UTD. Will check CBC, CMP, lipids, PSA with labs today. Encouraged healthy diet and regular exercise. Prevnar given today.

## 2013-06-30 NOTE — Addendum Note (Signed)
Addended by: Vernetta Honey on: 06/30/2013 11:18 AM   Modules accepted: Orders

## 2013-06-30 NOTE — Progress Notes (Signed)
Pre visit review using our clinic review tool, if applicable. No additional management support is needed unless otherwise documented below in the visit note. 

## 2013-06-30 NOTE — Progress Notes (Signed)
Subjective:    Patient ID: [name], male    DOB: 01-Jan-1941, 73 y.o.   MRN: [id number]  HPI The patient is here for annual Medicare wellness examination and management of other chronic and acute problems.   The risk factors are reflected in the social history.  The roster of all physicians providing medical care to patient - is listed in the Snapshot section of the chart.  Activities of daily living:  The patient is 100% independent in all ADLs: dressing, toileting, feeding as well as independent mobility.  Lives in [place] with wife. No pets. Home is one story, hardwood except for bedroom.  Home safety : The patient has smoke detectors in the home. They wear seatbelts.  There are locked firearms at home. There is no violence in the home.   There is no risks for hepatitis, STDs or HIV. There is no history of blood transfusion. They have no travel history to infectious disease endemic areas of the world.  The patient has seen their dentist in the last six month. Recently had tooth break off. (Dr. Bronson Curb) They have seen their eye doctor in the last year. (Dr. Linton Flemings) Hearing testing in the past, was normal.  They have deferred audiologic testing in the last year.   They do not  have excessive sun exposure. Discussed the need for sun protection: hats, long sleeves and use of sunscreen if there is significant sun exposure. (Dr. Kellie Moor)  Diet: the importance of a healthy diet is discussed. They do have a healthy diet.  The benefits of regular aerobic exercise were discussed.  Exercises occasionally more limited recently.  Depression screen: there are no signs or vegative symptoms of depression- irritability, change in appetite, anhedonia, sadness/tearfullness. Issues with depression in the past, none recently.  Cognitive assessment: the patient manages all their financial and personal affairs and is actively engaged. They could relate day,date,year and events.  HCPOA - Wife  then Daughter - [name] [phone]  The following portions of the patient's history were reviewed and updated as appropriate: allergies, current medications, past family history, past medical history,  past surgical history, past social history  and problem list.  Visual acuity was not assessed per patient preference since he has regular follow up with her ophthalmologist. Hearing and body mass index were assessed and reviewed.   During the course of the visit the patient was educated and counseled about appropriate screening and preventive services including : fall prevention , diabetes screening, nutrition counseling, colorectal cancer screening, and recommended immunizations.    BP 132/68  Pulse 75  Temp(Src) 98.1 F (36.7 C) (Oral)  Ht 5' 9.7" (1.77 m)  Wt 249 lb (112.946 kg)  BMI 36.05 kg/m2  SpO2 94%   Review of Systems  Constitutional: Negative for fever, chills, activity change, appetite change, fatigue and unexpected weight change.  Eyes: Negative for visual disturbance.  Respiratory: Negative for cough and shortness of breath.   Cardiovascular: Negative for chest pain, palpitations and leg swelling.  Gastrointestinal: Negative for abdominal pain and abdominal distention.  Genitourinary: Negative for dysuria, urgency and difficulty urinating.  Musculoskeletal: Negative for arthralgias and gait problem.  Skin: Negative for color change and rash.  Hematological: Negative for adenopathy.  Psychiatric/Behavioral: Negative for sleep disturbance and dysphoric mood. The patient is not nervous/anxious.        Objective:   Physical Exam  Constitutional: He is oriented to person, place, and time. He appears well-developed and well-nourished. No distress.  HENT:  Head: Normocephalic and atraumatic.  Right Ear: External ear normal.  Left Ear: External ear normal.  Nose: Nose normal.  Mouth/Throat: Oropharynx is clear and moist. No oropharyngeal exudate.  Eyes:  Conjunctivae and EOM are normal. Pupils are equal, round, and reactive to light. Right eye exhibits no discharge. Left eye exhibits no discharge. No scleral icterus.  Neck: Normal range of motion. Neck supple. No tracheal deviation present. No thyromegaly present.  Cardiovascular: Normal rate, regular rhythm and normal heart sounds.  Exam reveals no gallop and no friction rub.   No murmur heard. Pulmonary/Chest: Effort normal and breath sounds normal. No accessory muscle usage. Not tachypneic. No respiratory distress. He has no decreased breath sounds. He has no wheezes. He has no rhonchi. He has no rales. He exhibits no tenderness.  Abdominal: Soft. Bowel sounds are normal. He exhibits no distension and no mass. There is no tenderness. There is no rebound and no guarding.  Musculoskeletal: Normal range of motion. He exhibits no edema.  Lymphadenopathy:    He has no cervical adenopathy.  Neurological: He is alert and oriented to person, place, and time. No cranial nerve deficit. Coordination normal.  Skin: Skin is warm and dry. No rash noted. He is not diaphoretic. No erythema. No pallor.  Psychiatric: He has a normal mood and affect. His behavior is normal. Judgment and thought content normal.          Assessment & Plan:

## 2013-07-01 LAB — VITAMIN D 25 HYDROXY (VIT D DEFICIENCY, FRACTURES): Vit D, 25-Hydroxy: 41 ng/mL (ref 30–89)

## 2013-07-01 NOTE — Addendum Note (Signed)
Addended by: Ronette Deter A on: 07/01/2013 10:46 AM   Modules accepted: Orders

## 2013-07-20 DIAGNOSIS — R972 Elevated prostate specific antigen [PSA]: Secondary | ICD-10-CM | POA: Insufficient documentation

## 2013-08-29 LAB — URINALYSIS, COMPLETE
BACTERIA: NONE SEEN
Bilirubin,UR: NEGATIVE
Glucose,UR: NEGATIVE mg/dL (ref 0–75)
Ketone: NEGATIVE
LEUKOCYTE ESTERASE: NEGATIVE
NITRITE: NEGATIVE
PH: 5 (ref 4.5–8.0)
PROTEIN: NEGATIVE
Specific Gravity: 1.02 (ref 1.003–1.030)
Squamous Epithelial: <1
WBC UR: 1 /HPF (ref 0–5)

## 2013-08-29 LAB — CBC WITH DIFFERENTIAL/PLATELET
BASOS PCT: 0.3 %
Basophil #: 0.1 10*3/uL (ref 0.0–0.1)
EOS ABS: 0.1 10*3/uL (ref 0.0–0.7)
EOS PCT: 0.7 %
HCT: 41.6 % (ref 40.0–52.0)
HGB: 13.7 g/dL (ref 13.0–18.0)
Lymphocyte #: 1.4 10*3/uL (ref 1.0–3.6)
Lymphocyte %: 8.6 %
MCH: 29.9 pg (ref 26.0–34.0)
MCHC: 33 g/dL (ref 32.0–36.0)
MCV: 91 fL (ref 80–100)
Monocyte #: 1 x10 3/mm (ref 0.2–1.0)
Monocyte %: 5.9 %
Neutrophil #: 13.8 10*3/uL — ABNORMAL HIGH (ref 1.4–6.5)
Neutrophil %: 84.5 %
Platelet: 199 10*3/uL (ref 150–440)
RBC: 4.6 10*6/uL (ref 4.40–5.90)
RDW: 13.7 % (ref 11.5–14.5)
WBC: 16.3 10*3/uL — ABNORMAL HIGH (ref 3.8–10.6)

## 2013-08-29 LAB — COMPREHENSIVE METABOLIC PANEL
ALBUMIN: 3.7 g/dL (ref 3.4–5.0)
ALK PHOS: 81 U/L
AST: 35 U/L (ref 15–37)
Anion Gap: 4 — ABNORMAL LOW (ref 7–16)
BILIRUBIN TOTAL: 1.1 mg/dL — AB (ref 0.2–1.0)
BUN: 17 mg/dL (ref 7–18)
Calcium, Total: 8.8 mg/dL (ref 8.5–10.1)
Chloride: 102 mmol/L (ref 98–107)
Co2: 29 mmol/L (ref 21–32)
Creatinine: 1.57 mg/dL — ABNORMAL HIGH (ref 0.60–1.30)
EGFR (African American): 50 — ABNORMAL LOW
GFR CALC NON AF AMER: 43 — AB
Glucose: 102 mg/dL — ABNORMAL HIGH (ref 65–99)
Osmolality: 272 (ref 275–301)
Potassium: 3.8 mmol/L (ref 3.5–5.1)
SGPT (ALT): 34 U/L (ref 12–78)
SODIUM: 135 mmol/L — AB (ref 136–145)
Total Protein: 8.1 g/dL (ref 6.4–8.2)

## 2013-08-29 LAB — LIPASE, BLOOD: Lipase: 7642 U/L — ABNORMAL HIGH (ref 73–393)

## 2013-08-30 ENCOUNTER — Inpatient Hospital Stay: Payer: Self-pay | Admitting: Internal Medicine

## 2013-08-31 LAB — LIPID PANEL
Cholesterol: 118 mg/dL (ref 0–200)
HDL Cholesterol: 33 mg/dL — ABNORMAL LOW (ref 40–60)
Ldl Cholesterol, Calc: 73 mg/dL (ref 0–100)
TRIGLYCERIDES: 59 mg/dL (ref 0–200)
VLDL CHOLESTEROL, CALC: 12 mg/dL (ref 5–40)

## 2013-08-31 LAB — BASIC METABOLIC PANEL
Anion Gap: 5 — ABNORMAL LOW (ref 7–16)
BUN: 17 mg/dL (ref 7–18)
CALCIUM: 8.5 mg/dL (ref 8.5–10.1)
CHLORIDE: 107 mmol/L (ref 98–107)
Co2: 25 mmol/L (ref 21–32)
Creatinine: 1.45 mg/dL — ABNORMAL HIGH (ref 0.60–1.30)
EGFR (African American): 55 — ABNORMAL LOW
GFR CALC NON AF AMER: 47 — AB
Glucose: 78 mg/dL (ref 65–99)
Osmolality: 274 (ref 275–301)
POTASSIUM: 4 mmol/L (ref 3.5–5.1)
Sodium: 137 mmol/L (ref 136–145)

## 2013-08-31 LAB — LIPASE, BLOOD: LIPASE: 456 U/L — AB (ref 73–393)

## 2013-08-31 LAB — TSH: Thyroid Stimulating Horm: 1.38 u[IU]/mL

## 2013-09-01 ENCOUNTER — Telehealth: Payer: Self-pay | Admitting: Internal Medicine

## 2013-09-01 LAB — BASIC METABOLIC PANEL
ANION GAP: 8 (ref 7–16)
BUN: 14 mg/dL (ref 7–18)
Calcium, Total: 8 mg/dL — ABNORMAL LOW (ref 8.5–10.1)
Chloride: 106 mmol/L (ref 98–107)
Co2: 23 mmol/L (ref 21–32)
Creatinine: 1.4 mg/dL — ABNORMAL HIGH (ref 0.60–1.30)
EGFR (Non-African Amer.): 49 — ABNORMAL LOW
GFR CALC AF AMER: 57 — AB
GLUCOSE: 107 mg/dL — AB (ref 65–99)
Osmolality: 275 (ref 275–301)
Potassium: 3.6 mmol/L (ref 3.5–5.1)
Sodium: 137 mmol/L (ref 136–145)

## 2013-09-01 LAB — LIPASE, BLOOD: LIPASE: 230 U/L (ref 73–393)

## 2013-09-01 NOTE — Telephone Encounter (Signed)
[place] called to make 1-2 week hospital f/u; discharging 6/30.  No appts available.  Please advise pt directly of appt date and time. Records requested via fax.

## 2013-09-02 ENCOUNTER — Ambulatory Visit (INDEPENDENT_AMBULATORY_CARE_PROVIDER_SITE_OTHER): Payer: 59 | Admitting: Internal Medicine

## 2013-09-02 ENCOUNTER — Encounter: Payer: Self-pay | Admitting: Internal Medicine

## 2013-09-02 ENCOUNTER — Ambulatory Visit (INDEPENDENT_AMBULATORY_CARE_PROVIDER_SITE_OTHER)
Admission: RE | Admit: 2013-09-02 | Discharge: 2013-09-02 | Disposition: A | Discharge status: Home / Self Care | Payer: 59 | Source: Ambulatory Visit | Attending: Internal Medicine | Admitting: Internal Medicine

## 2013-09-02 ENCOUNTER — Ambulatory Visit: Payer: 59 | Admitting: Adult Health

## 2013-09-02 VITALS — BP 126/60 | HR 65 | Temp 98.3°F | Ht 69.7 in | Wt 255.0 lb

## 2013-09-02 DIAGNOSIS — M25539 Pain in unspecified wrist: Secondary | ICD-10-CM

## 2013-09-02 DIAGNOSIS — K859 Acute pancreatitis without necrosis or infection, unspecified: Secondary | ICD-10-CM | POA: Insufficient documentation

## 2013-09-02 DIAGNOSIS — K5900 Constipation, unspecified: Secondary | ICD-10-CM

## 2013-09-02 DIAGNOSIS — M25532 Pain in left wrist: Secondary | ICD-10-CM | POA: Insufficient documentation

## 2013-09-02 MED ORDER — SENNOSIDES-DOCUSATE SODIUM 8.6-50 MG PO TABS: 1.0000 | ORAL_TABLET | Freq: Every day | ORAL | Status: DC | PRN

## 2013-09-02 NOTE — Assessment & Plan Note (Signed)
Reviewed recent notes from [facility]. Unclear etiology. CT abdomen showed no other acute process. Last Lipase 230 on 6/20. Symptomatically, doing well. Tolerating full diet. Will have him RTC for recheck next week and sooner prn.

## 2013-09-02 NOTE — Progress Notes (Signed)
Pre visit review using our clinic review tool, if applicable. No additional management support is needed unless otherwise documented below in the visit note. 

## 2013-09-02 NOTE — Assessment & Plan Note (Signed)
Symptoms and exam most consistent with fracture. Will get plain film. Refer to ortho.

## 2013-09-02 NOTE — Progress Notes (Signed)
Notified pt. 

## 2013-09-02 NOTE — Patient Instructions (Signed)
Xray of your wrist today.  Follow up on Wednesday.

## 2013-09-02 NOTE — Progress Notes (Signed)
Subjective:    Patient ID: [name], male    DOB: 04/05/1940, 73 y.o.   MRN: [id number]  HPI 73YO male presents for acute visit.  Wrist pain - Thursday last week, fell backward off a chair, landing on left outstretched hand. Had pain initially. Then, Saturday morning developed abdominal pain. Went to ED, diagnosed with pancreatitis and admitted. Admitted from Saturday through Tuesday. Abdomen continues to feel tight. Constipated. In the hospital, took Lactulose with some improvement. Tolerating full diet, including large meal last night with milkshake. During ED stay, was on morphine for pancreatitis, so did not have wrist pain. However, after discharge home, pain in left wrist more prominent with swelling noted. Pain described as sharp and severe in mid wrist. No decreased strength noted.  Review of Systems  Constitutional: Negative for fever, chills, activity change, appetite change, fatigue and unexpected weight change.  Eyes: Negative for visual disturbance.  Respiratory: Negative for cough and shortness of breath.   Cardiovascular: Negative for chest pain, palpitations and leg swelling.  Gastrointestinal: Positive for constipation and abdominal distention. Negative for nausea, vomiting, abdominal pain, diarrhea and blood in stool.  Genitourinary: Negative for dysuria, urgency and difficulty urinating.  Musculoskeletal: Positive for arthralgias and joint swelling. Negative for gait problem.  Skin: Negative for color change and rash.  Hematological: Negative for adenopathy.  Psychiatric/Behavioral: Negative for sleep disturbance and dysphoric mood. The patient is not nervous/anxious.        Objective:    BP 126/60  Pulse 65  Temp(Src) 98.3 F (36.8 C) (Oral)  Ht 5' 9.7" (1.77 m)  Wt 255 lb (115.667 kg)  BMI 36.92 kg/m2  SpO2 91% Physical Exam  Constitutional: He is oriented to person, place, and time. He appears well-developed and well-nourished. No distress.  HENT:    Head: Normocephalic and atraumatic.  Right Ear: External ear normal.  Left Ear: External ear normal.  Nose: Nose normal.  Mouth/Throat: Oropharynx is clear and moist.  Eyes: Conjunctivae and EOM are normal. Pupils are equal, round, and reactive to light. Right eye exhibits no discharge. Left eye exhibits no discharge. No scleral icterus.  Neck: Normal range of motion. Neck supple. No tracheal deviation present. No thyromegaly present.  Cardiovascular: Normal rate, regular rhythm and normal heart sounds.  Exam reveals no gallop and no friction rub.   No murmur heard. Pulmonary/Chest: Effort normal and breath sounds normal. No accessory muscle usage. Not tachypneic. No respiratory distress. He has no decreased breath sounds. He has no wheezes. He has no rhonchi. He has no rales. He exhibits no tenderness.  Abdominal: Soft. Bowel sounds are normal. He exhibits distension (diffuse). There is no tenderness.  Musculoskeletal: He exhibits no edema.       Left wrist: He exhibits decreased range of motion, tenderness, bony tenderness and swelling.       Arms: Lymphadenopathy:    He has no cervical adenopathy.  Neurological: He is alert and oriented to person, place, and time. No cranial nerve deficit. Coordination normal.  Skin: Skin is warm and dry. No rash noted. He is not diaphoretic. No erythema. No pallor.  Psychiatric: He has a normal mood and affect. His behavior is normal. Judgment and thought content normal.          Assessment & Plan:   Problem List Items Addressed This Visit     Unprioritized   Left wrist pain - Primary     Symptoms and exam most consistent with fracture. Will get plain film.  Refer to ortho.    Relevant Orders      DG Wrist Complete Left      Ambulatory referral to Orthopedic Surgery   Pancreatitis, acute     Reviewed recent notes from [facility]. Unclear etiology. CT abdomen showed no other acute process. Last Lipase 230 on 6/20. Symptomatically, doing well.  Tolerating full diet. Will have him RTC for recheck next week and sooner prn.    Unspecified constipation     Constipation after recent pancreatitis. Will use prn Senna-Docusate. Follow up next week and prn.        No Follow-up on file.

## 2013-09-02 NOTE — Assessment & Plan Note (Signed)
Constipation after recent pancreatitis. Will use prn Senna-Docusate. Follow up next week and prn.

## 2013-09-04 ENCOUNTER — Encounter: Payer: Self-pay | Admitting: Internal Medicine

## 2013-09-09 ENCOUNTER — Encounter: Payer: Self-pay | Admitting: Internal Medicine

## 2013-09-09 ENCOUNTER — Ambulatory Visit (INDEPENDENT_AMBULATORY_CARE_PROVIDER_SITE_OTHER): Payer: Medicare Other | Admitting: Internal Medicine

## 2013-09-09 VITALS — BP 142/74 | HR 61 | Temp 98.0°F | Ht 69.7 in | Wt 250.8 lb

## 2013-09-09 DIAGNOSIS — M25539 Pain in unspecified wrist: Secondary | ICD-10-CM

## 2013-09-09 DIAGNOSIS — K859 Acute pancreatitis without necrosis or infection, unspecified: Secondary | ICD-10-CM

## 2013-09-09 DIAGNOSIS — E785 Hyperlipidemia, unspecified: Secondary | ICD-10-CM

## 2013-09-09 DIAGNOSIS — M25532 Pain in left wrist: Secondary | ICD-10-CM

## 2013-09-09 DIAGNOSIS — I10 Essential (primary) hypertension: Secondary | ICD-10-CM

## 2013-09-09 NOTE — Assessment & Plan Note (Signed)
Xray showed no fracture. Pain has resolved. Will continue to monitor.

## 2013-09-09 NOTE — Progress Notes (Signed)
Subjective:    Patient ID: [name], male    DOB: 1941-02-18, 73 y.o.   MRN: [id number]  HPI 73YO male presents for follow up.  Recent hospitalization for pancreatitis.  Having night sweats since hospitalization. No fever, abdominal pain, chest pain, nausea. Continues to have some belching. Tolerating full diet. Appetite improving.  Wrist pain - s/p splint. Pain has resolved. Xray showed no fracture.  Review of Systems  Constitutional: Positive for diaphoresis. Negative for fever, chills, activity change, appetite change, fatigue and unexpected weight change.  Eyes: Negative for visual disturbance.  Respiratory: Negative for cough and shortness of breath.   Cardiovascular: Negative for chest pain, palpitations and leg swelling.  Gastrointestinal: Positive for abdominal pain. Negative for nausea, vomiting, diarrhea, constipation, blood in stool and abdominal distention.  Genitourinary: Negative for dysuria, urgency and difficulty urinating.  Musculoskeletal: Negative for arthralgias, gait problem and myalgias.  Skin: Negative for color change and rash.  Hematological: Negative for adenopathy.  Psychiatric/Behavioral: Negative for sleep disturbance and dysphoric mood. The patient is not nervous/anxious.        Objective:    BP 142/74  Pulse 61  Temp(Src) 98 F (36.7 C) (Oral)  Ht 5' 9.7" (1.77 m)  Wt 250 lb 12 oz (113.739 kg)  BMI 36.30 kg/m2  SpO2 96% Physical Exam  Constitutional: He is oriented to person, place, and time. He appears well-developed and well-nourished. No distress.  HENT:  Head: Normocephalic and atraumatic.  Right Ear: External ear normal.  Left Ear: External ear normal.  Nose: Nose normal.  Mouth/Throat: Oropharynx is clear and moist. No oropharyngeal exudate.  Eyes: Conjunctivae and EOM are normal. Pupils are equal, round, and reactive to light. Right eye exhibits no discharge. Left eye exhibits no discharge. No scleral icterus.  Neck: Normal  range of motion. Neck supple. No tracheal deviation present. No thyromegaly present.  Cardiovascular: Normal rate, regular rhythm and normal heart sounds.  Exam reveals no gallop and no friction rub.   No murmur heard. Pulmonary/Chest: Effort normal and breath sounds normal. No respiratory distress. He has no wheezes. He has no rales. He exhibits no tenderness.  Abdominal: Soft. Bowel sounds are normal. He exhibits no distension and no mass. There is tenderness (mild right upper quadrant and epigastric area). There is no rebound and no guarding.  Musculoskeletal: Normal range of motion. He exhibits no edema.  Lymphadenopathy:    He has no cervical adenopathy.  Neurological: He is alert and oriented to person, place, and time. No cranial nerve deficit. Coordination normal.  Skin: Skin is warm and dry. No rash noted. He is not diaphoretic. No erythema. No pallor.  Psychiatric: He has a normal mood and affect. His behavior is normal. Judgment and thought content normal.          Assessment & Plan:   Problem List Items Addressed This Visit     Unprioritized   Left wrist pain     Xray showed no fracture. Pain has resolved. Will continue to monitor.    Pancreatitis, acute - Primary     Recent hospitalization for pancreatitis. Unclear etiology. Suspect viral infection. Doing well except for mild RUQ pain. Tolerating full diet. Will recheck amylase, lipase, LFTs today. If RUQ pain persistent, then will plan to repeat imaging.    Relevant Orders      CBC with Differential      Comprehensive metabolic panel      Lipase      TSH  Uric acid      Amylase       Return in about 4 weeks (around 10/07/2013) for Recheck.

## 2013-09-09 NOTE — Assessment & Plan Note (Signed)
Recent hospitalization for pancreatitis. Unclear etiology. Suspect viral infection. Doing well except for mild RUQ pain. Tolerating full diet. Will recheck amylase, lipase, LFTs today. If RUQ pain persistent, then will plan to repeat imaging.

## 2013-09-09 NOTE — Progress Notes (Signed)
Pre visit review using our clinic review tool, if applicable. No additional management support is needed unless otherwise documented below in the visit note. 

## 2013-09-09 NOTE — Patient Instructions (Addendum)
Labs today.  Check blood pressure 1-2 times daily and email with readings weekly.  Follow up in 4 weeks or sooner as needed.

## 2013-09-10 LAB — TSH: TSH: 0.41 u[IU]/mL (ref 0.35–4.50)

## 2013-09-10 LAB — CBC WITH DIFFERENTIAL/PLATELET
BASOS PCT: 0.6 % (ref 0.0–3.0)
Basophils Absolute: 0.1 10*3/uL (ref 0.0–0.1)
Eosinophils Absolute: 0.4 10*3/uL (ref 0.0–0.7)
Eosinophils Relative: 5 % (ref 0.0–5.0)
HCT: 36.3 % — ABNORMAL LOW (ref 39.0–52.0)
HEMOGLOBIN: 12.1 g/dL — AB (ref 13.0–17.0)
LYMPHS ABS: 1.8 10*3/uL (ref 0.7–4.0)
Lymphocytes Relative: 21.2 % (ref 12.0–46.0)
MCHC: 33.2 g/dL (ref 30.0–36.0)
MCV: 91.2 fl (ref 78.0–100.0)
MONOS PCT: 4.6 % (ref 3.0–12.0)
Monocytes Absolute: 0.4 10*3/uL (ref 0.1–1.0)
NEUTROS ABS: 5.9 10*3/uL (ref 1.4–7.7)
Neutrophils Relative %: 68.6 % (ref 43.0–77.0)
Platelets: 359 10*3/uL (ref 150.0–400.0)
RBC: 3.98 Mil/uL — AB (ref 4.22–5.81)
RDW: 14.1 % (ref 11.5–15.5)
WBC: 8.6 10*3/uL (ref 4.0–10.5)

## 2013-09-10 LAB — COMPREHENSIVE METABOLIC PANEL
ALT: 37 U/L (ref 0–53)
AST: 29 U/L (ref 0–37)
Albumin: 3.1 g/dL — ABNORMAL LOW (ref 3.5–5.2)
Alkaline Phosphatase: 114 U/L (ref 39–117)
BILIRUBIN TOTAL: 0.5 mg/dL (ref 0.2–1.2)
BUN: 16 mg/dL (ref 6–23)
CO2: 27 mEq/L (ref 19–32)
CREATININE: 1.3 mg/dL (ref 0.4–1.5)
Calcium: 8.9 mg/dL (ref 8.4–10.5)
Chloride: 103 mEq/L (ref 96–112)
GFR: 60.02 mL/min (ref >60.00)
Glucose, Bld: 77 mg/dL (ref 70–99)
Potassium: 4.3 mEq/L (ref 3.5–5.1)
Sodium: 138 mEq/L (ref 135–145)
Total Protein: 7.3 g/dL (ref 6.0–8.3)

## 2013-09-10 LAB — URIC ACID: URIC ACID, SERUM: 6.2 mg/dL (ref 4.0–7.8)

## 2013-09-10 LAB — LIPASE: Lipase: 45 U/L (ref 11.0–59.0)

## 2013-09-10 LAB — AMYLASE: Amylase: 71 U/L (ref 27–131)

## 2013-09-14 ENCOUNTER — Encounter: Payer: Self-pay | Admitting: Internal Medicine

## 2013-09-15 ENCOUNTER — Encounter: Payer: Self-pay | Admitting: Internal Medicine

## 2013-09-18 ENCOUNTER — Encounter: Payer: Self-pay | Admitting: Internal Medicine

## 2013-09-22 ENCOUNTER — Encounter: Payer: Self-pay | Admitting: Internal Medicine

## 2013-09-23 ENCOUNTER — Encounter: Payer: Self-pay | Admitting: Internal Medicine

## 2013-09-23 MED ORDER — METOPROLOL SUCCINATE ER 50 MG PO TB24: 50.0000 mg | ORAL_TABLET | Freq: Every day | ORAL | Status: DC

## 2013-09-23 NOTE — Telephone Encounter (Signed)
Fine to refill Rx #[id number] with 3 refills per Dr. Gilford Rile. Rx sent to pharmacy by escript

## 2013-09-28 ENCOUNTER — Encounter: Payer: Self-pay | Admitting: Internal Medicine

## 2013-10-14 ENCOUNTER — Encounter: Payer: Self-pay | Admitting: Internal Medicine

## 2013-10-15 ENCOUNTER — Encounter: Payer: Self-pay | Admitting: Internal Medicine

## 2013-10-15 ENCOUNTER — Ambulatory Visit (INDEPENDENT_AMBULATORY_CARE_PROVIDER_SITE_OTHER): Payer: Medicare Other | Admitting: Internal Medicine

## 2013-10-15 VITALS — BP 120/60 | HR 56 | Temp 98.1°F | Ht 69.7 in | Wt 247.5 lb

## 2013-10-15 DIAGNOSIS — I10 Essential (primary) hypertension: Secondary | ICD-10-CM

## 2013-10-15 DIAGNOSIS — D649 Anemia, unspecified: Secondary | ICD-10-CM

## 2013-10-15 MED ORDER — AMLODIPINE BESYLATE 2.5 MG PO TABS: 2.5000 mg | ORAL_TABLET | Freq: Every day | ORAL | Status: DC

## 2013-10-15 NOTE — Assessment & Plan Note (Signed)
Recent mild anemia noted on labs after episode of pancreatitis. Will recheck CBC with labs today. Discussed that this may be contributing to fatigue.

## 2013-10-15 NOTE — Progress Notes (Signed)
Subjective:    Patient ID: [name], male    DOB: 01-24-1941, 73 y.o.   MRN: [id number]  HPI 73YO male presents for follow up.  HTN - BP 137/80 this morning. Otherwise, mostly near 150/80s-90s. Compliant with meds.  Continues to feel "washed out" and fatigued. No abdominal pain, nausea, change in bowel habits. No chest pain, dyspnea. Working full time.  Review of Systems  Constitutional: Positive for fatigue. Negative for fever, chills, activity change, appetite change and unexpected weight change.  Eyes: Negative for visual disturbance.  Respiratory: Negative for cough and shortness of breath.   Cardiovascular: Negative for chest pain, palpitations and leg swelling.  Gastrointestinal: Negative for nausea, vomiting, abdominal pain, diarrhea, constipation, blood in stool and abdominal distention.  Genitourinary: Negative for dysuria, urgency and difficulty urinating.  Musculoskeletal: Negative for arthralgias and gait problem.  Skin: Negative for color change and rash.  Hematological: Negative for adenopathy.  Psychiatric/Behavioral: Negative for sleep disturbance and dysphoric mood. The patient is not nervous/anxious.        Objective:    BP 120/60  Pulse 56  Temp(Src) 98.1 F (36.7 C) (Oral)  Ht 5' 9.7" (1.77 m)  Wt 247 lb 8 oz (112.265 kg)  BMI 35.83 kg/m2  SpO2 96% Physical Exam  Constitutional: He is oriented to person, place, and time. He appears well-developed and well-nourished. No distress.  HENT:  Head: Normocephalic and atraumatic.  Right Ear: External ear normal.  Left Ear: External ear normal.  Nose: Nose normal.  Mouth/Throat: Oropharynx is clear and moist. No oropharyngeal exudate.  Eyes: Conjunctivae and EOM are normal. Pupils are equal, round, and reactive to light. Right eye exhibits no discharge. Left eye exhibits no discharge. No scleral icterus.  Neck: Normal range of motion. Neck supple. No tracheal deviation present. No thyromegaly present.    Cardiovascular: Normal rate, regular rhythm and normal heart sounds.  Exam reveals no gallop and no friction rub.   No murmur heard. Pulmonary/Chest: Effort normal and breath sounds normal. No accessory muscle usage. Not tachypneic. No respiratory distress. He has no decreased breath sounds. He has no wheezes. He has no rhonchi. He has no rales. He exhibits no tenderness.  Musculoskeletal: Normal range of motion. He exhibits no edema.  Lymphadenopathy:    He has no cervical adenopathy.  Neurological: He is alert and oriented to person, place, and time. No cranial nerve deficit. Coordination normal.  Skin: Skin is warm and dry. No rash noted. He is not diaphoretic. No erythema. No pallor.  Psychiatric: He has a normal mood and affect. His behavior is normal. Judgment and thought content normal.          Assessment & Plan:   Problem List Items Addressed This Visit     Unprioritized   Absolute anemia     Recent mild anemia noted on labs after episode of pancreatitis. Will recheck CBC with labs today. Discussed that this may be contributing to fatigue.    Relevant Orders      CBC w/Diff      Ferritin   HYPERTENSION - Primary      BP Readings from Last 3 Encounters:  10/15/13 120/60  09/09/13 142/74  09/02/13 126/60   BP slightly elevated at home. Will add prn Amlodipine 2.63m as needed if BP >140/90. Continue Metoprolol. Follow up in 4 weeks.    Relevant Medications      amLODIpine (NORVASC)  tablet   Other Relevant Orders      Comp  Met (CMET)       Return in about 4 weeks (around 11/12/2013) for Recheck.

## 2013-10-15 NOTE — Assessment & Plan Note (Signed)
BP Readings from Last 3 Encounters:  10/15/13 120/60  09/09/13 142/74  09/02/13 126/60   BP slightly elevated at home. Will add prn Amlodipine 2.5mg  as needed if BP >140/90. Continue Metoprolol. Follow up in 4 weeks.

## 2013-10-15 NOTE — Patient Instructions (Signed)
Check blood pressure daily.  If blood pressure > 140/90, then take Amlodipine 2.5mg .  Follow up in 4 weeks.

## 2013-10-15 NOTE — Progress Notes (Signed)
Pre visit review using our clinic review tool, if applicable. No additional management support is needed unless otherwise documented below in the visit note. 

## 2013-10-16 LAB — CBC WITH DIFFERENTIAL/PLATELET
Basophils Absolute: 0 10*3/uL (ref 0.0–0.1)
Basophils Relative: 0.7 % (ref 0.0–3.0)
Eosinophils Absolute: 0.4 10*3/uL (ref 0.0–0.7)
Eosinophils Relative: 6.4 % — ABNORMAL HIGH (ref 0.0–5.0)
HEMATOCRIT: 39.7 % (ref 39.0–52.0)
Hemoglobin: 13.3 g/dL (ref 13.0–17.0)
Lymphocytes Relative: 32.5 % (ref 12.0–46.0)
Lymphs Abs: 2.2 10*3/uL (ref 0.7–4.0)
MCHC: 33.6 g/dL (ref 30.0–36.0)
MCV: 90.5 fl (ref 78.0–100.0)
MONO ABS: 0.6 10*3/uL (ref 0.1–1.0)
Monocytes Relative: 8.4 % (ref 3.0–12.0)
Neutro Abs: 3.5 10*3/uL (ref 1.4–7.7)
Neutrophils Relative %: 52 % (ref 43.0–77.0)
Platelets: 235 10*3/uL (ref 150.0–400.0)
RBC: 4.38 Mil/uL (ref 4.22–5.81)
RDW: 14.1 % (ref 11.5–15.5)
WBC: 6.7 10*3/uL (ref 4.0–10.5)

## 2013-10-16 LAB — COMPREHENSIVE METABOLIC PANEL
ALK PHOS: 68 U/L (ref 39–117)
ALT: 25 U/L (ref 0–53)
AST: 27 U/L (ref 0–37)
Albumin: 3.8 g/dL (ref 3.5–5.2)
BILIRUBIN TOTAL: 0.7 mg/dL (ref 0.2–1.2)
BUN: 16 mg/dL (ref 6–23)
CHLORIDE: 103 meq/L (ref 96–112)
CO2: 24 mEq/L (ref 19–32)
CREATININE: 1.2 mg/dL (ref 0.4–1.5)
Calcium: 9.3 mg/dL (ref 8.4–10.5)
GFR: 63.51 mL/min (ref >60.00)
Glucose, Bld: 89 mg/dL (ref 70–99)
Potassium: 3.8 mEq/L (ref 3.5–5.1)
Sodium: 134 mEq/L — ABNORMAL LOW (ref 135–145)
Total Protein: 6.7 g/dL (ref 6.0–8.3)

## 2013-10-16 LAB — FERRITIN: FERRITIN: 65.8 ng/mL (ref 22.0–322.0)

## 2013-12-15 ENCOUNTER — Other Ambulatory Visit: Payer: Self-pay | Admitting: Internal Medicine

## 2014-01-01 ENCOUNTER — Ambulatory Visit (INDEPENDENT_AMBULATORY_CARE_PROVIDER_SITE_OTHER): Payer: Medicare Other | Admitting: Internal Medicine

## 2014-01-01 ENCOUNTER — Encounter: Payer: Self-pay | Admitting: Internal Medicine

## 2014-01-01 VITALS — BP 122/70 | HR 58 | Temp 98.0°F | Ht 69.7 in | Wt 249.0 lb

## 2014-01-01 DIAGNOSIS — I10 Essential (primary) hypertension: Secondary | ICD-10-CM

## 2014-01-01 NOTE — Assessment & Plan Note (Addendum)
BP Readings from Last 3 Encounters:  01/01/14 122/70  10/15/13 120/60  09/09/13 142/74   BP well controlled. Will continue Amlodipine and Metoprolol. Follow up in 06/2014.

## 2014-01-01 NOTE — Progress Notes (Signed)
Pre visit review using our clinic review tool, if applicable. No additional management support is needed unless otherwise documented below in the visit note. 

## 2014-01-01 NOTE — Patient Instructions (Signed)
Continue current medications.  Follow up in 06/2014 or sooner as needed.

## 2014-01-01 NOTE — Progress Notes (Signed)
   Subjective:    Patient ID: [name], male    DOB: 08-17-1940, 73 y.o.   MRN: [id number]  HPI 73YO male presents for follow up.  Feeling well. No concerns. Compliant with medications. Did not bring record of BP today.   Review of Systems  Constitutional: Negative for fever, chills, activity change, appetite change, fatigue and unexpected weight change.  Eyes: Negative for visual disturbance.  Respiratory: Negative for cough and shortness of breath.   Cardiovascular: Negative for chest pain, palpitations and leg swelling.  Gastrointestinal: Negative for abdominal pain and abdominal distention.  Genitourinary: Negative for dysuria, urgency and difficulty urinating.  Musculoskeletal: Negative for arthralgias and gait problem.  Skin: Negative for color change and rash.  Hematological: Negative for adenopathy.  Psychiatric/Behavioral: Negative for sleep disturbance and dysphoric mood. The patient is not nervous/anxious.        Objective:    BP 122/70  Pulse 58  Temp(Src) 98 F (36.7 C) (Oral)  Ht 5' 9.7" (1.77 m)  Wt 249 lb (112.946 kg)  BMI 36.05 kg/m2  SpO2 96% Physical Exam  Constitutional: He is oriented to person, place, and time. He appears well-developed and well-nourished. No distress.  HENT:  Head: Normocephalic and atraumatic.  Right Ear: External ear normal.  Left Ear: External ear normal.  Nose: Nose normal.  Mouth/Throat: Oropharynx is clear and moist. No oropharyngeal exudate.  Eyes: Conjunctivae and EOM are normal. Pupils are equal, round, and reactive to light. Right eye exhibits no discharge. Left eye exhibits no discharge. No scleral icterus.  Neck: Normal range of motion. Neck supple. No tracheal deviation present. No thyromegaly present.  Cardiovascular: Normal rate, regular rhythm and normal heart sounds.  Exam reveals no gallop and no friction rub.   No murmur heard. Pulmonary/Chest: Effort normal and breath sounds normal. No accessory muscle  usage. Not tachypneic. No respiratory distress. He has no decreased breath sounds. He has no wheezes. He has no rhonchi. He has no rales. He exhibits no tenderness.  Musculoskeletal: Normal range of motion. He exhibits no edema.  Lymphadenopathy:    He has no cervical adenopathy.  Neurological: He is alert and oriented to person, place, and time. No cranial nerve deficit. Coordination normal.  Skin: Skin is warm and dry. No rash noted. He is not diaphoretic. No erythema. No pallor.  Psychiatric: He has a normal mood and affect. His behavior is normal. Judgment and thought content normal.          Assessment & Plan:   Problem List Items Addressed This Visit     Unprioritized   Essential hypertension - Primary      BP Readings from Last 3 Encounters:  01/01/14 122/70  10/15/13 120/60  09/09/13 142/74   BP well controlled. Will continue Amlodipine and Metoprolol. Follow up in 06/2014.        Return in about 6 months (around 07/03/2014) for Wellness Visit.

## 2014-01-04 ENCOUNTER — Telehealth: Payer: Self-pay | Admitting: Internal Medicine

## 2014-01-04 NOTE — Telephone Encounter (Signed)
emmi emailed °

## 2014-02-17 ENCOUNTER — Other Ambulatory Visit: Payer: Self-pay

## 2014-02-17 MED ORDER — PANTOPRAZOLE SODIUM 40 MG PO TBEC: DELAYED_RELEASE_TABLET | ORAL | Status: DC

## 2014-02-17 NOTE — Telephone Encounter (Signed)
Patient request 90 day supply

## 2014-06-26 NOTE — Discharge Summary (Signed)
PATIENT NAME:  [name], [name] MR#:  [id number] DATE OF BIRTH:  Oct 26, 1940  DATE OF ADMISSION:  08/30/2013 DATE OF DISCHARGE:  09/01/2013  PRIMARY CARE PHYSICIAN: Nonlocal.   FINAL DIAGNOSES:  1. Acute pancreatitis. Could be benazepril/hydrochlorothiazide related. 2. Hypertension.  3. Acute renal injury, dehydration improved.   MEDICATIONS ON DISCHARGE: Include fish oil 1 tablet daily, multivitamin 1 tablet daily, Protonix 40 mg daily, metoprolol ER 25 mg extended-release daily. Stop taking benazepril/hydrochlorothiazide.   DIET: Low-sodium low-fat, low-cholesterol diet, regular consistency.   ACTIVITY: As tolerated.   FOLLOWUP: Follow up with Dr. Bernardo Heater of urology 1-2 weeks with your medical doctor.   HOSPITAL COURSE: The patient was admitted 08/30/2013, discharged 09/01/2013. Came in with abdominal pain, found to have a pancreatitis with a lipase of greater than 7000. The patient was given IV fluids, kept n.p.o., and given pain medication.   LABORATORY AND RADIOLOGICAL DATA DURING THE HOSPITAL COURSE: Included a lipase of 7642. Glucose 102, BUN 17, creatinine 1.57, sodium 135, potassium 3.8, chloride 102, CO2 of 29, calcium 8.8. Liver function tests normal range. Total bilirubin slightly up at 1.1. White blood cell count 16.3, H and H 13.7 and 41.6, platelet count of 199,000. Urinalysis 1+ blood.   EKG: Normal sinus rhythm. CT scan of the abdomen and pelvis with contrast showed no acute intraabdominal findings, colonic diverticulosis, marked prosthetic enlargement. Ultrasound of the abdomen showed no evidence of cholelithiasis or biliary ductal dilation, mild hepatic steatosis. Creatinine upon discharge 1.4 with a GFR of a 49, lipase was down to 230. TSH 1.38, LDL 73, triglycerides 59, HDL 33.   ASSESSMENT AND PLAN:  1. Acute pancreatitis. The patient does not drink alcohol. Gallbladder imaging was unremarkable. Triglycerides were normal. The patient does take  benazepril/hydrochlorothiazide, which could be a culprit. The patient was recently on Flomax and had clindamycin prior to dental procedures. Both the Flomax and clindamycin do not normally cause a pancreatitis. I stopped the patient's benazepril/hydrochlorothiazide. I gave vigorous IV fluids. His lipase improved rapidly and upon discharge, had no further abdominal pain and was tolerating diet, and he was discharged home in stable condition.  2. Hypertension. His benazepril/hydrochlorothiazide was stopped and his blood pressure upon discharge was a little borderline in the 140s. I did prescribe metoprolol ER as outpatient.  3. Acute renal injury, dehydration. This had improved a little bit with IV fluid hydration. I would recommend checking a BMP in followup appointment to see if the patient does have chronic kidney disease or not.   TIME SPENT ON DISCHARGE: 35 minutes.    ____________________________ Tana Conch. Leslye Peer, MD rjw:lt D: 09/01/2013 14:32:37 ET T: 09/01/2013 22:05:09 ET JOB#: [id number]  cc: Tana Conch. Leslye Peer, MD, <Dictator> Marisue Brooklyn MD ELECTRONICALLY SIGNED 09/03/2013 16:19

## 2014-06-26 NOTE — H&P (Signed)
PATIENT NAME:  [name], CULTON MR#:  [id number] DATE OF BIRTH:  [birth year]  DATE OF ADMISSION:  08/30/2013  PRIMARY CARE PHYSICIAN: Nonlocal.   REFERRING PHYSICIAN: Dr. Lavonia Drafts.   CHIEF COMPLAINT: Abdominal pain.   HISTORY OF PRESENT ILLNESS: The patient is a 74 year old male with a history of hypertension, who comes to the Emergency Department with complaints of abdominal pain, started since this morning. The pain started in the epigastric area after his breakfast. This is associated with mild nausea. The patient initially attributed this to constipation, took some laxatives, without much improvement. As the pain was gradually getting worse, he came to the Emergency Department. Work-up in the Emergency Department with lipase at 7600. CT abdomen and pelvis done, does not show any acute abnormalities, does not show any gallstones. The patient denies any previous episodes of pancreatitis. The patient is on quinapril hydrochlorothiazide.   PAST MEDICAL HISTORY: Hypertension.   PAST SURGICAL HISTORY: 1.  Bilateral knee placement.  2.  Lymph nodes removed from under the neck area. 3.  Tonsillectomy.   ALLERGIES:  1.  ASPIRIN.  2.  PENICILLIN.  3.  TYLENOL.    HOME MEDICATIONS: 1.  Protonix 40 mg once a day.  2.  Multivitamin once a day.  3.  Fish oil once a day.  4.  Benazepril/hydrochlorothiazide 1 tablet once a day.    SOCIAL HISTORY: No history of smoking, drinking alcohol or using illicit drugs. Marries, lives with his wife. Works at [employer]. Well functional at baseline.   FAMILY HISTORY: Blindness.   REVIEW OF SYSTEMS: CONSTITUTIONAL: Denies any generalized weakness.  EYES: No change in vision.  ENT: No change in hearing.  RESPIRATORY: No cough, shortness of breath.  CARDIOVASCULAR: No chest pain, palpations.  GASTROINTESTINAL: Has nausea, abdominal pain.  GENITOURINARY: No dysuria or hematuria.  HEMATOLOGIC: No easy bruising or bleeding.  SKIN: No rash or lesions.   ENDOCRINE: No polydipsia or polydipsia.  NEUROLOGIC: No weakness or numbness in any part of the body.   PHYSICAL EXAMINATION: GENERAL: This is a thin built, well-nourished, age-appropriate male, lying down in the bed, not in distress.  VITAL SIGNS: Temperature 97.8, pulse 82, blood pressure 131/64, respiratory rate of 18, oxygen saturation is 95% on room air.  HEENT: Head normocephalic, atraumatic. There is no scleral icterus. Conjunctivae normal. Pupils equal and reactive. Extraocular movements are intact. Mucous membranes moist. No pharyngeal erythema.  NECK: Supple. No lymphadenopathy. No JVD. No carotid bruit. No thyromegaly.  CHEST: Has no focal tenderness.  LUNGS: Bilaterally clear to auscultation.  HEART: S1, S2 regular. No murmurs are heard.  ABDOMEN: Bowel sounds present. Soft. Has tenderness in the epigastric area and upper part of the abdomen. Mild guarding. No rebound tenderness. Could not appreciate any hepatosplenomegaly.  EXTREMITIES: No pedal edema. Pulses 2+.  SKIN: No rash or lesions.  MUSCULOSKELETAL: Good range of motion in all the extremities.  NEUROLOGIC: The patient is alert, oriented to place, person, and time. Cranial nerves II through XII intact. Motor 5/5 in upper and lower extremities.   LABORATORY DATA: CT abdomen and pelvis: No acute abnormalities. Lipase 7642. Complete metabolic panel is completely within normal limits  except for mild elevation of the bilirubin of 1.1. Normal alkaline phosphatase. BUN 17, creatinine of 1.57. CBC: WBC of 16.3. The rest of the values are within normal limits.   ASSESSMENT AND PLAN: The patient is a 74 year old who comes in with abdominal pain and is found to have acute pancreatitis.  1.  Acute  pancreatitis. The patient does not show any gallstones; however, shows mild elevation of the total bilirubin of 1.1. We will obtain upper quadrant ultrasound. Will obtain lipid profile. The patient is on Benazepril/hydrochlorothiazide,  Protonix. These also could be causing pancreatitis. Keep the patient on nothing by mouth. Continue with IV fluids. Pain management as needed.  2.  Hypertension. Will hold the blood pressure medications for now.  3.  Mild adrenal insufficiency. Continue with intravenous fluids and follow up. Hold the hydrochlorothiazide quinapril.   4.  Keep the patient on deep vein thrombosis prophylaxis with Lovenox.   TIME SPENT: 50 minutes.   ____________________________ Monica Becton, MD pv:cg D: 08/30/2013 01:08:25 ET T: 08/30/2013 07:06:27 ET JOB#: [id number]  cc: Monica Becton, MD, <Dictator> Monica Becton MD ELECTRONICALLY SIGNED 09/09/2013 21:53

## 2014-07-12 ENCOUNTER — Encounter: Payer: Self-pay | Admitting: Internal Medicine

## 2014-07-12 ENCOUNTER — Emergency Department
Admission: EM | Admit: 2014-07-12 | Discharge: 2014-07-12 | Disposition: A | Discharge status: Home / Self Care | Payer: Medicare Other | Attending: Emergency Medicine | Admitting: Emergency Medicine

## 2014-07-12 ENCOUNTER — Other Ambulatory Visit: Payer: Self-pay

## 2014-07-12 ENCOUNTER — Emergency Department: Payer: Medicare Other

## 2014-07-12 ENCOUNTER — Encounter: Payer: Self-pay | Admitting: *Deleted

## 2014-07-12 DIAGNOSIS — Z792 Long term (current) use of antibiotics: Secondary | ICD-10-CM | POA: Insufficient documentation

## 2014-07-12 DIAGNOSIS — I10 Essential (primary) hypertension: Secondary | ICD-10-CM | POA: Insufficient documentation

## 2014-07-12 DIAGNOSIS — N200 Calculus of kidney: Secondary | ICD-10-CM

## 2014-07-12 DIAGNOSIS — Z79899 Other long term (current) drug therapy: Secondary | ICD-10-CM | POA: Insufficient documentation

## 2014-07-12 DIAGNOSIS — R109 Unspecified abdominal pain: Secondary | ICD-10-CM | POA: Diagnosis present

## 2014-07-12 LAB — COMPREHENSIVE METABOLIC PANEL
ALT: 27 U/L (ref 17–63)
AST: 34 U/L (ref 15–41)
Albumin: 4.3 g/dL (ref 3.5–5.0)
Alkaline Phosphatase: 79 U/L (ref 38–126)
Anion gap: 9 (ref 5–15)
BUN: 20 mg/dL (ref 6–20)
CALCIUM: 9 mg/dL (ref 8.9–10.3)
CO2: 25 mmol/L (ref 22–32)
Chloride: 105 mmol/L (ref 101–111)
Creatinine, Ser: 1.39 mg/dL — ABNORMAL HIGH (ref 0.61–1.24)
GFR calc Af Amer: 56 mL/min — ABNORMAL LOW (ref >60)
GFR calc non Af Amer: 48 mL/min — ABNORMAL LOW (ref >60)
Glucose, Bld: 144 mg/dL — ABNORMAL HIGH (ref 65–99)
Potassium: 3.8 mmol/L (ref 3.5–5.1)
Sodium: 139 mmol/L (ref 135–145)
Total Bilirubin: 0.6 mg/dL (ref 0.3–1.2)
Total Protein: 7.8 g/dL (ref 6.5–8.1)

## 2014-07-12 LAB — CBC WITH DIFFERENTIAL/PLATELET
Basophils Absolute: 0 10*3/uL (ref 0–0.1)
Basophils Relative: 0 %
Eosinophils Absolute: 0.5 10*3/uL (ref 0–0.7)
Eosinophils Relative: 4 %
HEMATOCRIT: 43.5 % (ref 40.0–52.0)
HEMOGLOBIN: 14.6 g/dL (ref 13.0–18.0)
LYMPHS PCT: 25 %
Lymphs Abs: 2.8 10*3/uL (ref 1.0–3.6)
MCH: 30.3 pg (ref 26.0–34.0)
MCHC: 33.5 g/dL (ref 32.0–36.0)
MCV: 90.2 fL (ref 80.0–100.0)
MONO ABS: 0.7 10*3/uL (ref 0.2–1.0)
MONOS PCT: 6 %
NEUTROS ABS: 7.2 10*3/uL — AB (ref 1.4–6.5)
Neutrophils Relative %: 65 %
Platelets: 195 10*3/uL (ref 150–440)
RBC: 4.82 MIL/uL (ref 4.40–5.90)
RDW: 13.5 % (ref 11.5–14.5)
WBC: 11.2 10*3/uL — AB (ref 3.8–10.6)

## 2014-07-12 LAB — URINALYSIS COMPLETE WITH MICROSCOPIC (ARMC ONLY)
BILIRUBIN URINE: NEGATIVE
Bacteria, UA: NONE SEEN
Glucose, UA: NEGATIVE mg/dL
Ketones, ur: NEGATIVE mg/dL
Leukocytes, UA: NEGATIVE
Nitrite: NEGATIVE
PH: 5 (ref 5.0–8.0)
Protein, ur: 30 mg/dL — AB
SPECIFIC GRAVITY, URINE: 1.018 (ref 1.005–1.030)

## 2014-07-12 LAB — LIPASE, BLOOD: Lipase: 30 U/L (ref 22–51)

## 2014-07-12 LAB — TROPONIN I

## 2014-07-12 MED ORDER — TAMSULOSIN HCL 0.4 MG PO CAPS: 0.4000 mg | ORAL_CAPSULE | Freq: Every day | ORAL | Status: AC

## 2014-07-12 MED ORDER — ONDANSETRON HCL 4 MG/2ML IJ SOLN
INTRAMUSCULAR | Status: AC
  Filled 2014-07-12: qty 2

## 2014-07-12 MED ORDER — ONDANSETRON HCL 4 MG/2ML IJ SOLN
4.0000 mg | Freq: Once | INTRAMUSCULAR | Status: AC
Start: 2014-07-12 — End: 2014-07-12
  Administered 2014-07-12: 4 mg via INTRAVENOUS

## 2014-07-12 MED ORDER — ONDANSETRON HCL 4 MG PO TABS: 4.0000 mg | ORAL_TABLET | Freq: Every day | ORAL | Status: AC | PRN

## 2014-07-12 MED ORDER — OXYCODONE HCL 5 MG PO TABS: 5.0000 mg | ORAL_TABLET | Freq: Four times a day (QID) | ORAL | Status: DC | PRN

## 2014-07-12 NOTE — ED Notes (Signed)
Pt c/o RLQ abdominal pain, nausea. Pt states last week he had dysuria but did not see treatment for it.

## 2014-07-12 NOTE — Telephone Encounter (Signed)
Needs urgent urology evaluation

## 2014-07-12 NOTE — Discharge Instructions (Signed)

## 2014-07-12 NOTE — ED Provider Notes (Signed)
[facility] Emergency Department Provider Note  ____________________________________________  Time seen: Approximately 214 AM  I have reviewed the triage vital signs and the nursing notes.   HISTORY  Chief Complaint Abdominal Pain    HPI [name] is a 74 y.o. male who comes in tonight with abdominal pain. The patient reports that he thought he was having pancreatitis flare. He reports that today around 11:00 he started having some abdominal pain in his lower abdomen. The patient reports that the pain is all on the right side. He reports that he noticed some blood in his urine and some burning last week she has been treating by drinkingcranberry juice. The patient reports that the pain eased off after he returned from church this afternoon but returned worse this evening before he went to bed. He reports that he feels as though he has to go to the bathroom but he is unable to go. The patient reports though that now that he is here the pain has improved again. The patient reports the pain as a 2 out of 10 currently and is achy or dull. Did feel nauseous with no vomiting. He denies any chest pain or fever. He did feel sweaty earlier but is improved currently. The patient came in for further evaluation.   Past Medical History  Diagnosis Date  . Anxiety   . Hyperlipidemia   . Hypertension   . Elevated PSA     Patient Active Problem List   Diagnosis Date Noted  . Absolute anemia 10/15/2013  . Left wrist pain 09/02/2013  . Unspecified constipation 09/02/2013  . Pancreatitis, acute 09/02/2013  . Medicare annual wellness visit, subsequent 06/23/2012  . Gout 06/23/2012  . Elevated PSA 07/06/2010  . DEPRESSION 06/26/2007  . HYPERLIPIDEMIA 08/19/2006  . ANXIETY 08/19/2006  . Essential hypertension 08/19/2006    Past Surgical History  Procedure Laterality Date  . Tonsillectomy    . Lymph node biopsy  1957    benign  . Back surgery      L4/5  . Total  knee arthroplasty  09-02-02    Bilateral  . Esophagogastroduodenoscopy  11-21-06    Esophagitis, Stricture/dilated   . Right arm fracture      Current Outpatient Rx  Name  Route  Sig  Dispense  Refill  . acyclovir ointment (ZOVIRAX) 5 %   Topical   Apply topically every 3 (three) hours. As needed for cold sores   30 g   3   . amLODipine (NORVASC) 2.5 MG tablet   Oral   Take 1 tablet (2.5 mg total) by mouth daily.   90 tablet   3   . aspirin 81 MG tablet   Oral   Take 81 mg by mouth daily.         . clindamycin (CLEOCIN) 300 MG capsule   Oral   Take 300 mg by mouth as needed. Prior to dental procedures         . colchicine (COLCRYS) 0.6 MG tablet   Oral   Take 1 tablet (0.6 mg total) by mouth daily as needed (When having a gout flare up).   30 tablet   3   . fluocinonide (LIDEX) 0.05 % cream   Topical   Apply topically daily.           . metoprolol succinate (TOPROL-XL) 50 MG 24 hr tablet   Oral   Take 1 tablet (50 mg total) by mouth daily. Take with or immediately following  a meal.   90 tablet   3   . Multiple Vitamins-Minerals ([employer] SPECTRAVITE SENIOR) TABS   Oral   Take by mouth. 1 by mouth every am          . Omega-3 Fatty Acids (THERAGRAN-M FISH OIL CONC) 1200 MG CAPS   Oral   Take by mouth daily.           . pantoprazole (PROTONIX) 40 MG tablet      TAKE 1 TABLET BY MOUTH 2 TIMES DAILY.   180 tablet   1     Allergies Penicillins; Acetaminophen; and Aspirin  Family History  Problem Relation Age of Onset  . Ulcers Father   . Diverticulitis Father   . Arthritis Father   . Hypertension Brother   . Obesity Brother   . Arthritis Mother   . Depression Mother     Anxiety    Social History History  Substance Use Topics  . Smoking status: Never Smoker   . Smokeless tobacco: Never Used  . Alcohol Use: No    Review of Systems Constitutional: No fever/chills Eyes: No visual changes. ENT: No sore throat. Cardiovascular: Denies  chest pain. Respiratory: Denies shortness of breath. Gastrointestinal: abdominal pain.  nausea, no vomiting.   Genitourinary: dysuria. Musculoskeletal: Negative for back pain. Skin: Negative for rash. Neurological: Negative for headaches, focal weakness or numbness. 10-point ROS otherwise negative.  ____________________________________________   PHYSICAL EXAM:  VITAL SIGNS: ED Triage Vitals  Enc Vitals Group     BP 07/12/14 0122 184/89 mmHg     Pulse Rate 07/12/14 0122 65     Resp 07/12/14 0122 22     Temp 07/12/14 0122 98.3 F (36.8 C)     Temp Source 07/12/14 0122 Oral     SpO2 07/12/14 0122 96 %     Weight 07/12/14 0122 248 lb 14.4 oz (112.9 kg)     Height 07/12/14 0122 5\' 9"  (1.753 m)     Head Cir --      Peak Flow --      Pain Score 07/12/14 0123 8     Pain Loc --      Pain Edu? --      Excl. in [place]? --     Constitutional: Alert and oriented. Well appearing and in no acute distress. Eyes: Conjunctivae are normal. PERRL. EOMI. Head: Atraumatic. Nose: No congestion/rhinnorhea. Mouth/Throat: Mucous membranes are moist.  Oropharynx non-erythematous. Neck: No stridor.  Cardiovascular: Normal rate, regular rhythm. Grossly normal heart sounds.  Good peripheral circulation. Respiratory: Normal respiratory effort.  No retractions. Lungs CTAB. Gastrointestinal: Soft and nontender. No distention.  No CVA tenderness. Genitourinary: deferred Musculoskeletal: No lower extremity tenderness nor edema.  No joint effusions. Neurologic:  Normal speech and language. No gross focal neurologic deficits are appreciated. Speech is normal. No gait instability. Skin:  Skin is warm, dry and intact. No rash noted. Psychiatric: Mood and affect are normal. Speech and behavior are normal.  ____________________________________________   LABS (all labs ordered are listed, but only abnormal results are displayed)  Labs Reviewed  CBC WITH DIFFERENTIAL/PLATELET - Abnormal; Notable for the  following:    WBC 11.2 (*)    Neutro Abs 7.2 (*)    All other components within normal limits  COMPREHENSIVE METABOLIC PANEL - Abnormal; Notable for the following:    Glucose, Bld 144 (*)    Creatinine, Ser 1.39 (*)    GFR calc non Af Amer 48 (*)    GFR calc Af  Amer 56 (*)    All other components within normal limits  URINALYSIS COMPLETEWITH MICROSCOPIC ([facility])  - Abnormal; Notable for the following:    Color, Urine YELLOW (*)    APPearance HAZY (*)    Hgb urine dipstick 3+ (*)    Protein, ur 30 (*)    Squamous Epithelial / LPF 0-5 (*)    All other components within normal limits  LIPASE, BLOOD  TROPONIN I   ____________________________________________  EKG  ED ECG REPORT   Date: 07/12/2014  EKG Time: 130  Rate: 63  Rhythm: normal EKG, normal sinus rhythm  Axis: Normal  Intervals:none  ST&T Change: Normal  ____________________________________________  RADIOLOGY  CT abdomen and pelvis: 3 mm stone in the distal right ureter, probably partially obstructing, bilateral inguinal hernias containing fat ____________________________________________   PROCEDURES  Procedure(s) performed: None  Critical Care performed: No  ____________________________________________   INITIAL IMPRESSION / ASSESSMENT AND PLAN / ED COURSE  Pertinent labs & imaging results that were available during my care of the patient were reviewed by me and considered in my medical decision making (see chart for details).  This is a 74 year old male who comes in with abdominal pain that has been coming and going throughout the day today. On the patient's urinalysis he does have too numerous to count red blood cells. Although the patient feels as though he may have a UTI concerned that the patient may have a kidney stone. I will perform a CT scan of the patient's abdomen and pelvis to determine if he has a kidney stone that may be causing his pain. Early the patient is not having significant pain I  will continue to assess him and treat with medication as needed.  ----------------------------------------- 3:46 AM on 07/12/2014 -----------------------------------------  It appears that the patient has an obstructing stone in his right distal ureter. His pain is currently controlled. I will discharge the patient home with some pain medicine and Flomax and have him follow-up with urology. ____________________________________________   FINAL CLINICAL IMPRESSION(S) / ED DIAGNOSES  Final diagnoses:  Kidney stone       Loney Hering, MD 07/12/14 [phone]

## 2014-07-16 ENCOUNTER — Ambulatory Visit (INDEPENDENT_AMBULATORY_CARE_PROVIDER_SITE_OTHER): Payer: Medicare Other | Admitting: Internal Medicine

## 2014-07-16 ENCOUNTER — Encounter: Payer: Self-pay | Admitting: Internal Medicine

## 2014-07-16 VITALS — BP 151/73 | HR 58 | Temp 98.0°F | Ht 69.0 in | Wt 244.4 lb

## 2014-07-16 DIAGNOSIS — N200 Calculus of kidney: Secondary | ICD-10-CM | POA: Diagnosis not present

## 2014-07-16 DIAGNOSIS — Z0000 Encounter for general adult medical examination without abnormal findings: Secondary | ICD-10-CM

## 2014-07-16 DIAGNOSIS — I10 Essential (primary) hypertension: Secondary | ICD-10-CM

## 2014-07-16 DIAGNOSIS — Z125 Encounter for screening for malignant neoplasm of prostate: Secondary | ICD-10-CM

## 2014-07-16 DIAGNOSIS — E785 Hyperlipidemia, unspecified: Secondary | ICD-10-CM | POA: Diagnosis not present

## 2014-07-16 LAB — LIPID PANEL
Cholesterol: 162 mg/dL (ref 0–200)
HDL: 35.5 mg/dL — ABNORMAL LOW (ref >39.00)
LDL CALC: 113 mg/dL — AB (ref 0–99)
NONHDL: 126.5
TRIGLYCERIDES: 68 mg/dL (ref 0.0–149.0)
Total CHOL/HDL Ratio: 5
VLDL: 13.6 mg/dL (ref 0.0–40.0)

## 2014-07-16 LAB — PSA, MEDICARE: PSA: 6.58 ng/ml — ABNORMAL HIGH (ref 0.10–4.00)

## 2014-07-16 MED ORDER — AMLODIPINE BESYLATE 5 MG PO TABS: 5.0000 mg | ORAL_TABLET | Freq: Every day | ORAL | Status: DC

## 2014-07-16 NOTE — Assessment & Plan Note (Signed)
General medical exam normal today. Appropriate screening performed. Health maintenance UTD. Reviewed recent labs. Will check lipids, PSA today. Encouraged healthy diet and regular exercise. Encouraged him to get TDAP at health department.

## 2014-07-16 NOTE — Progress Notes (Signed)
Pre visit review using our clinic review tool, if applicable. No additional management support is needed unless otherwise documented below in the visit note. 

## 2014-07-16 NOTE — Progress Notes (Signed)
Subjective:    Patient ID: [name], male    DOB: [birth year], 74 y.o.   MRN: [id number]  HPI  The patient is here for annual Medicare wellness examination and management of other chronic and acute problems.   The risk factors are reflected in the social history.  The roster of all physicians providing medical care to patient - is listed in the Snapshot section of the chart.  Activities of daily living:  The patient is 100% independent in all ADLs: dressing, toileting, feeding as well as independent mobility.  Lives in [place] with wife. No pets. Home is one story, hardwood except for bedroom.  Home safety : The patient has smoke detectors in the home. They wear seatbelts.  There are locked firearms at home. There is no violence in the home.   There is no risks for hepatitis, STDs or HIV. There is no history of blood transfusion. They have no travel history to infectious disease endemic areas of the world.  The patient has seen their dentist in the last six month.  (Dr. Bronson Curb) They have seen their eye doctor in the last year. (Dr. Linton Flemings) Hearing testing in the past, was normal.  They have deferred audiologic testing in the last year.   They do not  have excessive sun exposure. Discussed the need for sun protection: hats, long sleeves and use of sunscreen if there is significant sun exposure. (Dr. Kellie Moor) Urology - Dr. Beatrix Fetters  Diet: the importance of a healthy diet is discussed. They do have a healthy diet.  The benefits of regular aerobic exercise were discussed.  Exercises occasionally with walking. Very active at work.  Depression screen: there are no signs or vegative symptoms of depression- irritability, change in appetite, anhedonia, sadness/tearfullness. Issues with depression in the past, none recently.  Cognitive assessment: the patient manages all their financial and personal affairs and is actively engaged. They could relate day,date,year and  events.  HCPOA - Wife then Daughter - Farmer Mccahill [phone]  The following portions of the patient's history were reviewed and updated as appropriate: allergies, current medications, past family history, past medical history,  past surgical history, past social history  and problem list.  Visual acuity was not assessed per patient preference since he has regular follow up with her ophthalmologist. Hearing and body mass index were assessed and reviewed.   During the course of the visit the patient was educated and counseled about appropriate screening and preventive services including : fall prevention , diabetes screening, nutrition counseling, colorectal cancer screening, and recommended immunizations.    KIDNEY STONE: Seen in ED last weekend. Has not passed stone. Seen by Urology, Dr. Beatrix Fetters [id number]. Plan for follow up next week. No longer taking any pain medication.  HTN - BP has been mostly in 086-578I systolic.  Past medical, surgical, family and social history per today's encounter.  Review of Systems  Constitutional: Negative for fever, chills, activity change, appetite change, fatigue and unexpected weight change.  Eyes: Negative for visual disturbance.  Respiratory: Negative for cough and shortness of breath.   Cardiovascular: Negative for chest pain, palpitations and leg swelling.  Gastrointestinal: Negative for nausea, vomiting, abdominal pain, diarrhea, constipation and abdominal distention.  Genitourinary: Negative for dysuria, urgency, frequency, hematuria, flank pain, difficulty urinating, penile pain and testicular pain.  Musculoskeletal: Negative for myalgias, arthralgias and gait problem.  Skin: Negative for color change and rash.  Hematological: Negative for adenopathy.  Psychiatric/Behavioral: Negative for suicidal ideas, sleep disturbance and dysphoric  mood. The patient is not nervous/anxious.        Objective:    BP 151/73 mmHg  Pulse 58  Temp(Src) 98  F (36.7 C) (Oral)  Ht 5\' 9"  (1.753 m)  Wt 244 lb 6 oz (110.848 kg)  BMI 36.07 kg/m2  SpO2 99% Physical Exam  Constitutional: He is oriented to person, place, and time. He appears well-developed and well-nourished. No distress.  HENT:  Head: Normocephalic and atraumatic.  Right Ear: External ear normal.  Left Ear: External ear normal.  Nose: Nose normal.  Mouth/Throat: Oropharynx is clear and moist. No oropharyngeal exudate.  Eyes: Conjunctivae and EOM are normal. Pupils are equal, round, and reactive to light. Right eye exhibits no discharge. Left eye exhibits no discharge. No scleral icterus.  Neck: Normal range of motion. Neck supple. No tracheal deviation present. No thyromegaly present.  Cardiovascular: Normal rate, regular rhythm and normal heart sounds.  Exam reveals no gallop and no friction rub.   No murmur heard. Pulmonary/Chest: Effort normal and breath sounds normal. No respiratory distress. He has no wheezes. He has no rales. He exhibits no tenderness.  Abdominal: Soft. Bowel sounds are normal. He exhibits no distension and no mass. There is no tenderness. There is no rebound and no guarding.  Musculoskeletal: Normal range of motion. He exhibits no edema.  Lymphadenopathy:    He has no cervical adenopathy.  Neurological: He is alert and oriented to person, place, and time. No cranial nerve deficit. Coordination normal.  Skin: Skin is warm and dry. No rash noted. He is not diaphoretic. No erythema. No pallor.  Psychiatric: He has a normal mood and affect. His behavior is normal. Judgment and thought content normal.          Assessment & Plan:   Problem List Items Addressed This Visit      Unprioritized   Essential hypertension    BP Readings from Last 3 Encounters:  07/16/14 151/73  07/12/14 152/89  01/01/14 122/70   BP elevated today. Will increase Amlodipine to 5mg  daily. Repeat BP check in 4 weeks and prn.      Relevant Medications   amLODipine (NORVASC)  5 MG tablet   Medicare annual wellness visit, subsequent - Primary    General medical exam normal today. Appropriate screening performed. Health maintenance UTD. Reviewed recent labs. Will check lipids, PSA today. Encouraged healthy diet and regular exercise. Encouraged him to get TDAP at health department.        Relevant Orders   PSA, Medicare   Lipid panel   Renal stone    Right 1mm renal stone. Reviewed CT report. Urology evaluation in progress with follow up next week. Flank pain has resolved. Will continue to monitor.          Return in about 4 weeks (around 08/13/2014) for Recheck of Blood Pressure.

## 2014-07-16 NOTE — Assessment & Plan Note (Signed)
BP Readings from Last 3 Encounters:  07/16/14 151/73  07/12/14 152/89  01/01/14 122/70   BP elevated today. Will increase Amlodipine to 5mg  daily. Repeat BP check in 4 weeks and prn.

## 2014-07-16 NOTE — Patient Instructions (Addendum)
Increase Amlodipine to 5mg  daily. Follow up in 4 weeks.  Health Maintenance A healthy lifestyle and preventative care can promote health and wellness.  Maintain regular health, dental, and eye exams.  Eat a healthy diet. Foods like vegetables, fruits, whole grains, low-fat dairy products, and lean protein foods contain the nutrients you need and are low in calories. Decrease your intake of foods high in solid fats, added sugars, and salt. Get information about a proper diet from your health care provider, if necessary.  Regular physical exercise is one of the most important things you can do for your health. Most adults should get at least 150 minutes of moderate-intensity exercise (any activity that increases your heart rate and causes you to sweat) each week. In addition, most adults need muscle-strengthening exercises on 2 or more days a week.   Maintain a healthy weight. The body mass index (BMI) is a screening tool to identify possible weight problems. It provides an estimate of body fat based on height and weight. Your health care provider can find your BMI and can help you achieve or maintain a healthy weight. For males 20 years and older:  A BMI below 18.5 is considered underweight.  A BMI of 18.5 to 24.9 is normal.  A BMI of 25 to 29.9 is considered overweight.  A BMI of 30 and above is considered obese.  Maintain normal blood lipids and cholesterol by exercising and minimizing your intake of saturated fat. Eat a balanced diet with plenty of fruits and vegetables. Blood tests for lipids and cholesterol should begin at age 67 and be repeated every 5 years. If your lipid or cholesterol levels are high, you are over age 88, or you are at high risk for heart disease, you may need your cholesterol levels checked more frequently.Ongoing high lipid and cholesterol levels should be treated with medicines if diet and exercise are not working.  If you smoke, find out from your health care  provider how to quit. If you do not use tobacco, do not start.  Lung cancer screening is recommended for adults aged 33-80 years who are at high risk for developing lung cancer because of a history of smoking. A yearly low-dose CT scan of the lungs is recommended for people who have at least a 30-pack-year history of smoking and are current smokers or have quit within the past 15 years. A pack year of smoking is smoking an average of 1 pack of cigarettes a day for 1 year (for example, a 30-pack-year history of smoking could mean smoking 1 pack a day for 30 years or 2 packs a day for 15 years). Yearly screening should continue until the smoker has stopped smoking for at least 15 years. Yearly screening should be stopped for people who develop a health problem that would prevent them from having lung cancer treatment.  If you choose to drink alcohol, do not have more than 2 drinks per day. One drink is considered to be 12 oz (360 mL) of beer, 5 oz (150 mL) of wine, or 1.5 oz (45 mL) of liquor.  Avoid the use of street drugs. Do not share needles with anyone. Ask for help if you need support or instructions about stopping the use of drugs.  High blood pressure causes heart disease and increases the risk of stroke. Blood pressure should be checked at least every 1-2 years. Ongoing high blood pressure should be treated with medicines if weight loss and exercise are not effective.  If  you are 45-39 years old, ask your health care provider if you should take aspirin to prevent heart disease.  Diabetes screening involves taking a blood sample to check your fasting blood sugar level. This should be done once every 3 years after age 2 if you are at a normal weight and without risk factors for diabetes. Testing should be considered at a younger age or be carried out more frequently if you are overweight and have at least 1 risk factor for diabetes.  Colorectal cancer can be detected and often prevented. Most  routine colorectal cancer screening begins at the age of 51 and continues through age 57. However, your health care provider may recommend screening at an earlier age if you have risk factors for colon cancer. On a yearly basis, your health care provider may provide home test kits to check for hidden blood in the stool. A small camera at the end of a tube may be used to directly examine the colon (sigmoidoscopy or colonoscopy) to detect the earliest forms of colorectal cancer. Talk to your health care provider about this at age 35 when routine screening begins. A direct exam of the colon should be repeated every 5-10 years through age 34, unless early forms of precancerous polyps or small growths are found.  People who are at an increased risk for hepatitis B should be screened for this virus. You are considered at high risk for hepatitis B if:  You were born in a country where hepatitis B occurs often. Talk with your health care provider about which countries are considered high risk.  Your parents were born in a high-risk country and you have not received a shot to protect against hepatitis B (hepatitis B vaccine).  You have HIV or AIDS.  You use needles to inject street drugs.  You live with, or have sex with, someone who has hepatitis B.  You are a man who has sex with other men (MSM).  You get hemodialysis treatment.  You take certain medicines for conditions like cancer, organ transplantation, and autoimmune conditions.  Hepatitis C blood testing is recommended for all people born from [birth year] through 1966 and any individual with known risk factors for hepatitis C.  Healthy men should no longer receive prostate-specific antigen (PSA) blood tests as part of routine cancer screening. Talk to your health care provider about prostate cancer screening.  Testicular cancer screening is not recommended for adolescents or adult males who have no symptoms. Screening includes self-exam, a health care  provider exam, and other screening tests. Consult with your health care provider about any symptoms you have or any concerns you have about testicular cancer.  Practice safe sex. Use condoms and avoid high-risk sexual practices to reduce the spread of sexually transmitted infections (STIs).  You should be screened for STIs, including gonorrhea and chlamydia if:  You are sexually active and are younger than 24 years.  You are older than 24 years, and your health care provider tells you that you are at risk for this type of infection.  Your sexual activity has changed since you were last screened, and you are at an increased risk for chlamydia or gonorrhea. Ask your health care provider if you are at risk.  If you are at risk of being infected with HIV, it is recommended that you take a prescription medicine daily to prevent HIV infection. This is called pre-exposure prophylaxis (PrEP). You are considered at risk if:  You are a man who has sex  with other men (MSM).  You are a heterosexual man who is sexually active with multiple partners.  You take drugs by injection.  You are sexually active with a partner who has HIV.  Talk with your health care provider about whether you are at high risk of being infected with HIV. If you choose to begin PrEP, you should first be tested for HIV. You should then be tested every 3 months for as long as you are taking PrEP.  Use sunscreen. Apply sunscreen liberally and repeatedly throughout the day. You should seek shade when your shadow is shorter than you. Protect yourself by wearing long sleeves, pants, a wide-brimmed hat, and sunglasses year round whenever you are outdoors.  Tell your health care provider of new moles or changes in moles, especially if there is a change in shape or color. Also, tell your health care provider if a mole is larger than the size of a pencil eraser.  A one-time screening for abdominal aortic aneurysm (AAA) and surgical  repair of large AAAs by ultrasound is recommended for men aged 7-75 years who are current or former smokers.  Stay current with your vaccines (immunizations). Document Released: 08/18/2007 Document Revised: 02/24/2013 Document Reviewed: 07/17/2010 [facility] Patient Information 2015 [place], [place]. This information is not intended to replace advice given to you by your health care provider. Make sure you discuss any questions you have with your health care provider.

## 2014-07-16 NOTE — Assessment & Plan Note (Signed)
Right 54mm renal stone. Reviewed CT report. Urology evaluation in progress with follow up next week. Flank pain has resolved. Will continue to monitor.

## 2014-08-18 ENCOUNTER — Ambulatory Visit (INDEPENDENT_AMBULATORY_CARE_PROVIDER_SITE_OTHER): Payer: Medicare Other

## 2014-08-18 VITALS — BP 130/68 | HR 70 | Resp 18

## 2014-08-18 DIAGNOSIS — I10 Essential (primary) hypertension: Secondary | ICD-10-CM

## 2014-08-18 NOTE — Progress Notes (Signed)
Patient came in for BP Check, see vitals.  Patient very happy with BP results with new medication changes.  Patient is concerned that he feels tired, which is not the normal for him.  Wanted to know if lab work on 5/9, 5/13 showed anything, if not is there any additional lab work that can be done.  Patient also brought an updated Immunization record, added at T-dap recently at the health dept. Updated Immunization history.

## 2014-08-18 NOTE — Progress Notes (Signed)
Called patient, explained that if he continues to be tired to call the office and schedule an appointment.  Verbalized understanding.

## 2014-08-18 NOTE — Progress Notes (Signed)
Labs did not show any findings to explain fatigue. If he has continued fatigue, then needs to be seen in a visit.

## 2014-08-21 ENCOUNTER — Other Ambulatory Visit: Payer: Self-pay | Admitting: Internal Medicine

## 2014-09-15 ENCOUNTER — Other Ambulatory Visit: Payer: Self-pay | Admitting: Internal Medicine

## 2014-10-28 ENCOUNTER — Ambulatory Visit (INDEPENDENT_AMBULATORY_CARE_PROVIDER_SITE_OTHER): Payer: Medicare Other | Admitting: *Deleted

## 2014-10-28 DIAGNOSIS — Z23 Encounter for immunization: Secondary | ICD-10-CM

## 2015-02-21 ENCOUNTER — Other Ambulatory Visit: Payer: Self-pay | Admitting: Internal Medicine

## 2015-07-20 ENCOUNTER — Ambulatory Visit (INDEPENDENT_AMBULATORY_CARE_PROVIDER_SITE_OTHER): Payer: Medicare Other | Admitting: Internal Medicine

## 2015-07-20 ENCOUNTER — Encounter: Payer: Self-pay | Admitting: Internal Medicine

## 2015-07-20 VITALS — BP 142/82 | HR 64 | Ht 68.5 in | Wt 251.4 lb

## 2015-07-20 DIAGNOSIS — Z0000 Encounter for general adult medical examination without abnormal findings: Secondary | ICD-10-CM | POA: Diagnosis not present

## 2015-07-20 LAB — CBC WITH DIFFERENTIAL/PLATELET
Basophils Absolute: 0 10*3/uL (ref 0.0–0.1)
Basophils Relative: 0.5 % (ref 0.0–3.0)
Eosinophils Absolute: 0.6 10*3/uL (ref 0.0–0.7)
Eosinophils Relative: 7.7 % — ABNORMAL HIGH (ref 0.0–5.0)
HEMATOCRIT: 43.1 % (ref 39.0–52.0)
Hemoglobin: 14.6 g/dL (ref 13.0–17.0)
LYMPHS PCT: 29 % (ref 12.0–46.0)
Lymphs Abs: 2.4 10*3/uL (ref 0.7–4.0)
MCHC: 33.9 g/dL (ref 30.0–36.0)
MCV: 89.8 fl (ref 78.0–100.0)
MONOS PCT: 7.6 % (ref 3.0–12.0)
Monocytes Absolute: 0.6 10*3/uL (ref 0.1–1.0)
NEUTROS ABS: 4.5 10*3/uL (ref 1.4–7.7)
Neutrophils Relative %: 55.2 % (ref 43.0–77.0)
PLATELETS: 211 10*3/uL (ref 150.0–400.0)
RBC: 4.8 Mil/uL (ref 4.22–5.81)
RDW: 14 % (ref 11.5–15.5)
WBC: 8.2 10*3/uL (ref 4.0–10.5)

## 2015-07-20 LAB — LIPID PANEL
CHOL/HDL RATIO: 5
Cholesterol: 162 mg/dL (ref 0–200)
HDL: 30.3 mg/dL — ABNORMAL LOW (ref >39.00)
LDL CALC: 99 mg/dL (ref 0–99)
NonHDL: 131.58
TRIGLYCERIDES: 161 mg/dL — AB (ref 0.0–149.0)
VLDL: 32.2 mg/dL (ref 0.0–40.0)

## 2015-07-20 LAB — MICROALBUMIN / CREATININE URINE RATIO
Creatinine,U: 135.7 mg/dL
Microalb Creat Ratio: 2.7 mg/g (ref 0.0–30.0)
Microalb, Ur: 3.7 mg/dL — ABNORMAL HIGH (ref 0.0–1.9)

## 2015-07-20 LAB — COMPREHENSIVE METABOLIC PANEL
ALT: 24 U/L (ref 0–53)
AST: 26 U/L (ref 0–37)
Albumin: 4.3 g/dL (ref 3.5–5.2)
Alkaline Phosphatase: 71 U/L (ref 39–117)
BUN: 18 mg/dL (ref 6–23)
CALCIUM: 9.4 mg/dL (ref 8.4–10.5)
CHLORIDE: 105 meq/L (ref 96–112)
CO2: 27 meq/L (ref 19–32)
Creatinine, Ser: 1.11 mg/dL (ref 0.40–1.50)
GFR: 68.49 mL/min (ref >60.00)
Glucose, Bld: 99 mg/dL (ref 70–99)
Potassium: 4.2 mEq/L (ref 3.5–5.1)
Sodium: 138 mEq/L (ref 135–145)
Total Bilirubin: 0.5 mg/dL (ref 0.2–1.2)
Total Protein: 7.4 g/dL (ref 6.0–8.3)

## 2015-07-20 LAB — PSA, MEDICARE: PSA: 5.45 ng/ml — ABNORMAL HIGH (ref 0.10–4.00)

## 2015-07-20 NOTE — Patient Instructions (Signed)

## 2015-07-20 NOTE — Assessment & Plan Note (Addendum)
General medical exam normal today. Encouraged continued healthy diet and physical activity. Immunizations are UTD, plan to repeat Pneumovax in 2018. Colonoscopy due in 2018. Labs today. PSA today. He also follows with urology yearly for DRE. Encouraged him to set up living will and make wishes known to family members. He will consider this.

## 2015-07-20 NOTE — Addendum Note (Signed)
Addended by: Ronette Deter A on: 07/20/2015 10:14 AM   Modules accepted: SmartSet

## 2015-07-20 NOTE — Progress Notes (Addendum)
Subjective:    Patient ID: [name], male    DOB: August 27, 1940, 75 y.o.   MRN: [id number]  HPI  75YO male presents for physical exam.  Feeling well. BP at home mostly 140s. Working at [employer]. Physically active. Limiting sweets.  Wt Readings from Last 3 Encounters:  07/20/15 251 lb 6.4 oz (114.034 kg)  07/16/14 244 lb 6 oz (110.848 kg)  07/12/14 248 lb 14.4 oz (112.9 kg)   BP Readings from Last 3 Encounters:  07/20/15 142/82  08/18/14 130/68  07/16/14 151/73    Past Medical History  Diagnosis Date  . Anxiety   . Hyperlipidemia   . Hypertension   . Elevated PSA    Family History  Problem Relation Age of Onset  . Ulcers Father   . Diverticulitis Father   . Arthritis Father   . Hypertension Brother   . Obesity Brother   . Arthritis Mother   . Depression Mother     Anxiety   Past Surgical History  Procedure Laterality Date  . Tonsillectomy    . Lymph node biopsy  1957    benign  . Back surgery      L4/5  . Total knee arthroplasty  09-02-02    Bilateral  . Esophagogastroduodenoscopy  11-21-06    Esophagitis, Stricture/dilated   . Right arm fracture     Social History   Social History  . Marital Status: Married    Spouse Name: N/A  . Number of Children: 1  . Years of Education: N/A   Occupational History  . Retired Psychologist, occupational    Social History Main Topics  . Smoking status: Never Smoker   . Smokeless tobacco: Never Used  . Alcohol Use: No  . Drug Use: No  . Sexual Activity: No   Other Topics Concern  . None   Social History Narrative   Lives in [place] with wife. From [place], [place]. Retired from [employer] fund[employer], works at [employer] now. Daughter, [name], lives in [place]. No pets.      Exercise: regular   Diet: increased sweets    Review of Systems  Constitutional: Negative for fever, chills, activity change, appetite change, fatigue and unexpected weight change.  Eyes: Negative for visual disturbance.  Respiratory:  Negative for cough and shortness of breath.   Cardiovascular: Negative for chest pain, palpitations and leg swelling.  Gastrointestinal: Negative for nausea, vomiting, abdominal pain, diarrhea, constipation and abdominal distention.  Genitourinary: Negative for dysuria, urgency, frequency, decreased urine volume and difficulty urinating.  Musculoskeletal: Negative for arthralgias and gait problem.  Skin: Negative for color change and rash.  Hematological: Negative for adenopathy.  Psychiatric/Behavioral: Negative for suicidal ideas, sleep disturbance and dysphoric mood. The patient is not nervous/anxious.        Objective:    BP 142/82 mmHg  Pulse 64  Ht 5' 8.5" (1.74 m)  Wt 251 lb 6.4 oz (114.034 kg)  BMI 37.66 kg/m2  SpO2 97% Physical Exam  Constitutional: He is oriented to person, place, and time. He appears well-developed and well-nourished. No distress.  HENT:  Head: Normocephalic and atraumatic.  Right Ear: External ear normal.  Left Ear: External ear normal.  Nose: Nose normal.  Mouth/Throat: Oropharynx is clear and moist. No oropharyngeal exudate.  Eyes: Conjunctivae and EOM are normal. Pupils are equal, round, and reactive to light. Right eye exhibits no discharge. Left eye exhibits no discharge. No scleral icterus.  Neck: Normal range of motion. Neck supple.  No tracheal deviation present. No thyromegaly present.  Cardiovascular: Normal rate, regular rhythm and normal heart sounds.  Exam reveals no gallop and no friction rub.   No murmur heard. Pulmonary/Chest: Effort normal and breath sounds normal. No respiratory distress. He has no wheezes. He has no rales. He exhibits no tenderness.  Abdominal: Soft. Bowel sounds are normal. He exhibits no distension and no mass. There is no tenderness. There is no rebound and no guarding.  Musculoskeletal: Normal range of motion. He exhibits no edema.  Lymphadenopathy:    He has no cervical adenopathy.  Neurological: He is alert and  oriented to person, place, and time. No cranial nerve deficit. Coordination normal.  Skin: Skin is warm and dry. No rash noted. He is not diaphoretic. No erythema. No pallor.  Psychiatric: He has a normal mood and affect. His behavior is normal. Judgment and thought content normal.          Assessment & Plan:   Problem List Items Addressed This Visit      Unprioritized   Routine general medical examination at a health care facility - Primary    General medical exam normal today. Encouraged continued healthy diet and physical activity. Immunizations are UTD, plan to repeat Pneumovax in 2018. Colonoscopy due in 2018. Labs today. PSA today. He also follows with urology yearly for DRE. Encouraged him to set up living will and make wishes known to family members. He will consider this.      Relevant Orders   CBC with Differential/Platelet   Comprehensive metabolic panel   Lipid panel   Microalbumin / creatinine urine ratio   PSA, Medicare       Return in about 6 months (around 01/20/2016) for Recheck.  Ronette Deter, MD Internal Medicine [facility]

## 2015-07-20 NOTE — Progress Notes (Signed)
Pre visit review using our clinic review tool, if applicable. No additional management support is needed unless otherwise documented below in the visit note. 

## 2015-09-19 ENCOUNTER — Other Ambulatory Visit: Payer: Self-pay

## 2015-09-19 MED ORDER — PANTOPRAZOLE SODIUM 40 MG PO TBEC: 40.0000 mg | DELAYED_RELEASE_TABLET | Freq: Two times a day (BID) | ORAL | Status: DC

## 2015-10-10 ENCOUNTER — Other Ambulatory Visit: Payer: Self-pay | Admitting: Internal Medicine

## 2015-10-12 ENCOUNTER — Ambulatory Visit (INDEPENDENT_AMBULATORY_CARE_PROVIDER_SITE_OTHER): Payer: Medicare Other

## 2015-10-12 VITALS — BP 138/72 | HR 66 | Temp 98.6°F | Resp 12 | Ht 68.0 in | Wt 256.0 lb

## 2015-10-12 DIAGNOSIS — Z0000 Encounter for general adult medical examination without abnormal findings: Secondary | ICD-10-CM | POA: Diagnosis not present

## 2015-10-12 NOTE — Progress Notes (Signed)
Subjective:   [name] is a 75 y.o. male who presents for Medicare Annual/Subsequent preventive examination.  Review of Systems:  No ROS.  Medicare Wellness Visit.  Cardiac Risk Factors include: male gender;hypertension;advanced age (>75men, >75 women)     Objective:    Vitals: BP 138/72 (BP Location: Right Arm, Patient Position: Sitting, Cuff Size: Normal)   Pulse 66   Temp 98.6 F (37 C) (Oral)   Resp 12   Ht 5\' 8"  (1.727 m)   Wt 256 lb (116.1 kg)   SpO2 95%   BMI 38.92 kg/m   Body mass index is 38.92 kg/m.  Tobacco History  Smoking Status  . Never Smoker  Smokeless Tobacco  . Never Used     Counseling given: Not Answered   Past Medical History:  Diagnosis Date  . Anxiety   . Elevated PSA   . Hyperlipidemia   . Hypertension    Past Surgical History:  Procedure Laterality Date  . BACK SURGERY     L4/5  . ESOPHAGOGASTRODUODENOSCOPY  11-21-06   Esophagitis, Stricture/dilated   . LYMPH NODE BIOPSY  1957   benign  . Right arm fracture    . TONSILLECTOMY    . TOTAL KNEE ARTHROPLASTY  09-02-02   Bilateral   Family History  Problem Relation Age of Onset  . Ulcers Father   . Diverticulitis Father   . Arthritis Father   . Hypertension Brother   . Obesity Brother   . Arthritis Mother   . Depression Mother     Anxiety   History  Sexual Activity  . Sexual activity: No    Outpatient Encounter Prescriptions as of 10/12/2015  Medication Sig  . acyclovir ointment (ZOVIRAX) 5 % Apply topically every 3 (three) hours. As needed for cold sores  . amLODipine (NORVASC) 5 MG tablet Take 1 tablet (5 mg total) by mouth daily.  Marland Kitchen aspirin 81 MG tablet Take 81 mg by mouth daily.  . clindamycin (CLEOCIN) 300 MG capsule Take 300 mg by mouth as needed. Prior to dental procedures  . colchicine (COLCRYS) 0.6 MG tablet Take 1 tablet (0.6 mg total) by mouth daily as needed (When having a gout flare up).  . fluocinonide (LIDEX) 0.05 % cream Apply topically daily.    .  metoprolol succinate (TOPROL-XL) 50 MG 24 hr tablet TAKE 1 TABLET BY MOUTH DAILY IMMEDIATELY FOLLOWING A MEAL  . Multiple Vitamins-Minerals ([facility] SPECTRAVITE SENIOR) TABS Take by mouth. 1 by mouth every am   . Omega-3 Fatty Acids (THERAGRAN-M FISH OIL CONC) 1200 MG CAPS Take by mouth daily.    . pantoprazole (PROTONIX) 40 MG tablet Take 1 tablet (40 mg total) by mouth 2 (two) times daily.   No facility-administered encounter medications on file as of 10/12/2015.     Activities of Daily Living In your present state of health, do you have any difficulty performing the following activities: 10/12/2015 07/20/2015  Hearing? N N  Vision? N N  Difficulty concentrating or making decisions? N N  Walking or climbing stairs? N N  Dressing or bathing? N N  Doing errands, shopping? N N  Preparing Food and eating ? N -  Using the Toilet? N -  In the past six months, have you accidently leaked urine? N -  Do you have problems with loss of bowel control? N -  Managing your Medications? N -  Managing your Finances? N -  Housekeeping or managing your Housekeeping? N -  Some recent data  might be hidden    Patient Care Team: Leone Haven, MD as PCP - General (Family Medicine) Kennieth Francois, MD (Dermatology) Birder Robson, MD as Referring Physician (Ophthalmology) Gaynelle Arabian, MD (Orthopedic Surgery)   Assessment:    This is a routine wellness examination for [facility]. The goal of the wellness visit is to assist the patient how to close the gaps in care and create a preventative care plan for the patient.   Osteoporosis risk reviewed.  Medications reviewed; taking without issues or barriers. Reports taking Nafcillin (prescribed by his dentist last week) for continued dental procedures.  He does not remember the dose.  Encouraged to bring bottle to next visit.    Safety issues reviewed; smoke and carbon monoxide detectors in the home. Firearms locked in a safe within the home. Wears  seatbelts when driving or riding with others. No violence in the home.  No identified risk were noted; The patient was oriented x 3; appropriate in dress and manner and no objective failures at ADL's or IADL's.   Body mass index; discussed the importance of a healthy diet, water intake and exercise. Educational material provided.  Health maintenance gaps; closed.  Patient Concerns: None at this time.  Follow up with  PCP as needed.  Exercise Activities and Dietary recommendations Current Exercise Habits: The patient has a physically strenous job, but has no regular exercise apart from work. (Works at [employer]), Type of exercise: walking, Frequency (Times/Week): 5, Intensity: Moderate  Goals    . Increase water intake      Fall Risk Fall Risk  10/12/2015 07/20/2015 07/16/2014 06/30/2013 06/23/2012  Falls in the past year? No No No No No   Depression Screen PHQ 2/9 Scores 10/12/2015 07/20/2015 07/16/2014 06/30/2013  PHQ - 2 Score 0 0 0 0    Cognitive Testing MMSE - Mini Mental State Exam 10/12/2015  Orientation to time 5  Orientation to Place 5  Registration 3  Attention/ Calculation 5  Recall 3  Language- name 2 objects 2  Language- repeat 1  Language- follow 3 step command 3  Language- read & follow direction 1  Write a sentence 1  Copy design 1  Total score 30    Immunization History  Administered Date(s) Administered  . Influenza Whole 01/03/2006, 12/24/2011  . Influenza,inj,Quad PF,36+ Mos 12/15/2012, 10/28/2014  . Influenza-Unspecified 12/25/2013  . Pneumococcal Conjugate-13 06/30/2013  . Pneumococcal Polysaccharide-23 06/21/2006  . Td 08/19/2002  . Tdap 08/13/2014  . Zoster 12/28/2009   Screening Tests Health Maintenance  Topic Date Due  . INFLUENZA VACCINE  10/04/2015  . TETANUS/TDAP  08/12/2024  . ZOSTAVAX  Completed  . PNA vac Low Risk Adult  Completed      Plan:   End of life planning; Advance aging; Advanced directives discussed. No current  HCPOA/Living Will.  Additional information declined at this time per patient request.  Follow up with PCP.  During the course of the visit the patient was educated and counseled about the following appropriate screening and preventive services:   Vaccines to include Pneumoccal, Influenza, Hepatitis B, Td, Zostavax, HCV  Electrocardiogram  Cardiovascular Disease  Colorectal cancer screening  Diabetes screening  Prostate Cancer Screening  Glaucoma screening  Nutrition counseling   Smoking cessation counseling  Patient Instructions (the written plan) was given to the patient.    Varney Biles, LPN  [id number]

## 2015-10-12 NOTE — Patient Instructions (Addendum)
  Mr. Romo , Thank you for taking time to come for your Medicare Wellness Visit. I appreciate your ongoing commitment to your health goals. Please review the following plan we discussed and let me know if I can assist you in the future.    FOLLOW UP WITH DR. SONNENBERG AS NEEDED.    These are the goals we discussed: Goals    . Increase water intake       This is a list of the screening recommended for you and due dates:  Health Maintenance  Topic Date Due  . Flu Shot  10/04/2015  . Tetanus Vaccine  08/12/2024  . Shingles Vaccine  Completed  . Pneumonia vaccines  Completed

## 2015-10-17 NOTE — Progress Notes (Signed)
I have reviewed the above note and agree.  Magenta Schmiesing, M.D.  

## 2015-10-31 ENCOUNTER — Other Ambulatory Visit: Payer: Self-pay | Admitting: Surgical

## 2015-10-31 DIAGNOSIS — I10 Essential (primary) hypertension: Secondary | ICD-10-CM

## 2015-10-31 MED ORDER — AMLODIPINE BESYLATE 5 MG PO TABS: 5.0000 mg | ORAL_TABLET | Freq: Every day | ORAL | 1 refills | Status: DC

## 2015-11-24 ENCOUNTER — Telehealth: Payer: Self-pay | Admitting: Family Medicine

## 2015-11-24 NOTE — Telephone Encounter (Signed)
Pt had flu shot on 11/08/15 @ [facility]. Thank you!

## 2016-01-09 ENCOUNTER — Ambulatory Visit: Payer: Medicare Other | Admitting: Internal Medicine

## 2016-01-09 ENCOUNTER — Ambulatory Visit (INDEPENDENT_AMBULATORY_CARE_PROVIDER_SITE_OTHER): Payer: Medicare Other | Admitting: Family Medicine

## 2016-01-09 ENCOUNTER — Encounter: Payer: Self-pay | Admitting: Family Medicine

## 2016-01-09 DIAGNOSIS — I10 Essential (primary) hypertension: Secondary | ICD-10-CM | POA: Diagnosis not present

## 2016-01-09 DIAGNOSIS — Z6838 Body mass index (BMI) 38.0-38.9, adult: Secondary | ICD-10-CM

## 2016-01-09 DIAGNOSIS — E669 Obesity, unspecified: Secondary | ICD-10-CM | POA: Insufficient documentation

## 2016-01-09 DIAGNOSIS — K219 Gastro-esophageal reflux disease without esophagitis: Secondary | ICD-10-CM | POA: Diagnosis not present

## 2016-01-09 DIAGNOSIS — R972 Elevated prostate specific antigen [PSA]: Secondary | ICD-10-CM | POA: Diagnosis not present

## 2016-01-09 DIAGNOSIS — E6609 Other obesity due to excess calories: Secondary | ICD-10-CM | POA: Diagnosis not present

## 2016-01-09 NOTE — Progress Notes (Signed)
  Tommi Rumps, MD Phone: [phone]  AARONJOHN BURNO is a 75 y.o. male who presents today for f/u.  HYPERTENSION  Disease Monitoring  Home BP Monitoring not checking consistently Chest pain- no    Dyspnea- no Medications  Compliance-  taking metoprolol, amlodipine.   Edema- minimal, reports is only happens if he has tight socks on, no orthopnea or PND.  Elevated PSA: Last checked in May. It was trending down. This is followed by urology in [place]. No urinary complaints. He has follow-up with them sometime soon.  GERD: Takes Protonix twice daily. He notes no burning, sour taste, abdominal pain, or blood in his stool. He reports having had an EGD 8-10 years ago. No symptoms at this time.  Obesity: Patient notes he does not do any specific physical exercise though he is physically active at work at [employer]. He notes his diet is pretty good with the exception of eating a fair amount of ice cream and drinking sodas. He notes if he could cut down on the sodas he could lose some weight.   PMH: nonsmoker.   ROS see history of present illness  Objective  Physical Exam Vitals:   01/09/16 0758  BP: 138/72  Pulse: 60  Temp: 98.1 F (36.7 C)    BP Readings from Last 3 Encounters:  01/09/16 138/72  10/12/15 138/72  07/20/15 (!) 142/82   Wt Readings from Last 3 Encounters:  01/09/16 252 lb 12.8 oz (114.7 kg)  10/12/15 256 lb (116.1 kg)  07/20/15 251 lb 6.4 oz (114 kg)    Physical Exam  Constitutional: No distress.  Cardiovascular: Normal rate, regular rhythm and normal heart sounds.   Pulmonary/Chest: Effort normal and breath sounds normal.  Abdominal: Soft. Bowel sounds are normal. He exhibits no distension. There is no tenderness. There is no rebound and no guarding.  Musculoskeletal: He exhibits no edema.  Neurological: He is alert. Gait normal.  Skin: Skin is warm and dry. He is not diaphoretic.     Assessment/Plan: Please see individual problem list.  Essential  hypertension At goal. Continue current medications. Swelling potentially related to amlodipine. No CHF symptoms. He'll continue to monitor. Will monitor his blood pressure 1-2 times a week.  Elevated PSA Followed by urology for this. Most recent PSA was trending down. He'll follow-up with them as scheduled.  GERD (gastroesophageal reflux disease) Well-controlled on Protonix. We will continue this medication.  Obesity Discussed diet and exercise. He is very physically active at work. Discussed decreasing his soda intake. Advised he could do unsweetened tea to get caffeine.   Tommi Rumps, MD [place]

## 2016-01-09 NOTE — Progress Notes (Signed)
Pre visit review using our clinic review tool, if applicable. No additional management support is needed unless otherwise documented below in the visit note. 

## 2016-01-09 NOTE — Assessment & Plan Note (Signed)
Well-controlled on Protonix. We will continue this medication.

## 2016-01-09 NOTE — Patient Instructions (Signed)
Nice to see you. Please try to decrease your soda intake. Please start checking your blood pressure 1-2 times a week. Your goal is less than 150/90. Please make sure you see the urologist as scheduled.

## 2016-01-09 NOTE — Assessment & Plan Note (Signed)
Discussed diet and exercise. He is very physically active at work. Discussed decreasing his soda intake. Advised he could do unsweetened tea to get caffeine.

## 2016-01-09 NOTE — Assessment & Plan Note (Signed)
At goal. Continue current medications. Swelling potentially related to amlodipine. No CHF symptoms. He'll continue to monitor. Will monitor his blood pressure 1-2 times a week.

## 2016-01-09 NOTE — Assessment & Plan Note (Signed)
Followed by urology for this. Most recent PSA was trending down. He'll follow-up with them as scheduled.

## 2016-04-26 ENCOUNTER — Ambulatory Visit
Admission: RE | Admit: 2016-04-26 | Discharge: 2016-04-26 | Disposition: A | Discharge status: Home / Self Care | Payer: Medicare Other | Source: Ambulatory Visit | Attending: Family Medicine | Admitting: Family Medicine

## 2016-04-26 ENCOUNTER — Ambulatory Visit (INDEPENDENT_AMBULATORY_CARE_PROVIDER_SITE_OTHER): Payer: Medicare Other | Admitting: Family Medicine

## 2016-04-26 ENCOUNTER — Encounter: Payer: Self-pay | Admitting: Family Medicine

## 2016-04-26 VITALS — BP 142/80 | HR 76 | Temp 98.1°F | Wt 257.4 lb

## 2016-04-26 DIAGNOSIS — R350 Frequency of micturition: Secondary | ICD-10-CM

## 2016-04-26 DIAGNOSIS — M7989 Other specified soft tissue disorders: Secondary | ICD-10-CM | POA: Diagnosis not present

## 2016-04-26 DIAGNOSIS — G5600 Carpal tunnel syndrome, unspecified upper limb: Secondary | ICD-10-CM | POA: Insufficient documentation

## 2016-04-26 LAB — POCT URINALYSIS DIPSTICK
BILIRUBIN UA: NEGATIVE
Blood, UA: NEGATIVE
GLUCOSE UA: NEGATIVE
KETONES UA: NEGATIVE
Leukocytes, UA: NEGATIVE
Nitrite, UA: NEGATIVE
Protein, UA: NEGATIVE
Spec Grav, UA: >=1.03
Urobilinogen, UA: 0.2
pH, UA: 6

## 2016-04-26 MED ORDER — PREDNISONE 20 MG PO TABS: 40.0000 mg | ORAL_TABLET | Freq: Every day | ORAL | 0 refills | Status: DC

## 2016-04-26 NOTE — Assessment & Plan Note (Signed)
Patient with moderate swelling of right hand and right distal forearm for the last day. Given extent of swelling the worry would be for right upper extremity DVT though this would seem unlikely given that he is active. Symptoms can additionally be related to his osteoarthritis as he does have DIP and PIP nodules bilaterally. Could also be related to gout as he does have a history of gout. There was no injury. Unlikely infection given lack of erythema, induration, and warmth. He is neurovascularly intact. We'll obtain a right upper extremity venous ultrasound to rule out DVT. If negative we will place on prednisone. If not improving by tomorrow with prednisone we will have him see orthopedics. He is given return precautions.

## 2016-04-26 NOTE — Progress Notes (Signed)
Tommi Rumps, MD Phone: [phone]  [name] is a 76 y.o. male who presents today for same-day visit.  Patient notes onset of right hand swelling yesterday morning. He woke up and his right hand which is swollen. He notes no injury. He notes no fevers. He notes no erythema. He does have a history of gout those never had it in his hand. Pain has been worsening. No history of blood clot. He does stay active. He works at [employer]  Patient notes urinary frequency yesterday and last night. None today. Minimal urgency. No dysuria. States he saw urology recently and they stated his prostate was in good shape.  PMH: nonsmoker.   ROS see history of present illness  Objective  Physical Exam Vitals:   04/26/16 0850  BP: (!) 142/80  Pulse: 76  Temp: 98.1 F (36.7 C)    BP Readings from Last 3 Encounters:  04/26/16 (!) 142/80  01/09/16 138/72  10/12/15 138/72   Wt Readings from Last 3 Encounters:  04/26/16 257 lb 6.4 oz (116.8 kg)  01/09/16 252 lb 12.8 oz (114.7 kg)  10/12/15 256 lb (116.1 kg)    Physical Exam  Constitutional: He is well-developed, well-nourished, and in no distress.  Cardiovascular: Normal rate, regular rhythm and normal heart sounds.   Pulmonary/Chest: Effort normal and breath sounds normal.  Musculoskeletal:  Right hand and distal right forearm with moderate swelling, there is no warmth, erythema, or induration, there is tenderness over much of the hand, there is no anatomic snuffbox tenderness, there is mild tenderness over the distal right forearm as well, there is no swelling of the proximal arm, there is no tenderness of the proximal arm, there are no cords palpated, right hand is warm and well perfused, 2+ radial pulse, 5 out of 5 grip though third through fifth fingers difficult to bend given swelling, left hand and arm with no swelling or tenderness, warm and well perfused  Neurological: He is alert. Gait normal.  Skin: Skin is warm and dry.      Assessment/Plan: Please see individual problem list.  Swelling of right hand Patient with moderate swelling of right hand and right distal forearm for the last day. Given extent of swelling the worry would be for right upper extremity DVT though this would seem unlikely given that he is active. Symptoms can additionally be related to his osteoarthritis as he does have DIP and PIP nodules bilaterally. Could also be related to gout as he does have a history of gout. There was no injury. Unlikely infection given lack of erythema, induration, and warmth. He is neurovascularly intact. We'll obtain a right upper extremity venous ultrasound to rule out DVT. If negative we will place on prednisone. If not improving by tomorrow with prednisone we will have him see orthopedics. He is given return precautions.  Frequent urination 1 day frequent urination. Has resolved at this time. Urinalysis was negative. Discussed continuing to monitor and if recurs having an let us know.   Orders Placed This Encounter  Procedures  . US Venous Img Upper Uni Right    PT CAN LEAVE    Standing Status:   Future    Standing Expiration Date:   06/24/2017    Order Specific Question:   Reason for Exam (SYMPTOM  OR DIAGNOSIS REQUIRED)    Answer:   right hand and distal forearm swelling and tenderness 1 day    Order Specific Question:   Preferred imaging location?    Answer:  [facility]    Order Specific Question:   Call Results- Best Contact Number?    AnswerPI:[phone]  . POCT Urinalysis Dipstick    Tommi Rumps, MD [place]

## 2016-04-26 NOTE — Patient Instructions (Signed)
Nice to see you. We are going to obtain an ultrasound of your right upper extremity to rule out blood clot as a cause of your swelling. If there is no blood clot we're going to treat you with prednisone to treat for possible gout flare versus osteoarthritis. If you're symptoms have not improved with this by tomorrow we'll have you see orthopedics. If you develop any numbness or weakness in the arm, inability to close your hand, or any new or change in symptoms please seek medical attention immediately.

## 2016-04-26 NOTE — Progress Notes (Signed)
Pre visit review using our clinic review tool, if applicable. No additional management support is needed unless otherwise documented below in the visit note. 

## 2016-04-26 NOTE — Assessment & Plan Note (Signed)
1 day frequent urination. Has resolved at this time. Urinalysis was negative. Discussed continuing to monitor and if recurs having an let us know.

## 2016-05-01 ENCOUNTER — Other Ambulatory Visit: Payer: Self-pay | Admitting: Family Medicine

## 2016-05-05 ENCOUNTER — Encounter: Payer: Self-pay | Admitting: Family Medicine

## 2016-05-07 ENCOUNTER — Other Ambulatory Visit: Payer: Self-pay | Admitting: Family Medicine

## 2016-05-07 DIAGNOSIS — M7989 Other specified soft tissue disorders: Secondary | ICD-10-CM

## 2016-07-18 ENCOUNTER — Ambulatory Visit (INDEPENDENT_AMBULATORY_CARE_PROVIDER_SITE_OTHER): Payer: Medicare Other | Admitting: Family Medicine

## 2016-07-18 ENCOUNTER — Encounter: Payer: Self-pay | Admitting: Family Medicine

## 2016-07-18 VITALS — BP 122/72 | HR 61 | Temp 98.0°F | Wt 247.2 lb

## 2016-07-18 DIAGNOSIS — M25512 Pain in left shoulder: Secondary | ICD-10-CM | POA: Diagnosis not present

## 2016-07-18 DIAGNOSIS — M7542 Impingement syndrome of left shoulder: Secondary | ICD-10-CM

## 2016-07-18 DIAGNOSIS — E785 Hyperlipidemia, unspecified: Secondary | ICD-10-CM

## 2016-07-18 DIAGNOSIS — M7989 Other specified soft tissue disorders: Secondary | ICD-10-CM

## 2016-07-18 DIAGNOSIS — I10 Essential (primary) hypertension: Secondary | ICD-10-CM | POA: Diagnosis not present

## 2016-07-18 MED ORDER — METHYLPREDNISOLONE ACETATE 40 MG/ML IJ SUSP
40.0000 mg | Freq: Once | INTRAMUSCULAR | Status: AC
  Administered 2016-07-18: 40 mg via INTRAMUSCULAR

## 2016-07-18 NOTE — Assessment & Plan Note (Signed)
Swelling is improved significantly though continues to have some numbness and tingling in the ulnar aspect of the hand. He is following with orthopedics for this and they have ordered nerve conduction studies which will be performed today. Encouraged him to follow up with those.

## 2016-07-18 NOTE — Assessment & Plan Note (Signed)
History and exam consistent with rotator cuff impingement. Discussed options for treatment with patient. He opted for subacromial injection in the office and home exercises. Please see procedure note for this. Tolerated procedure well and had improvement in his symptoms. Given return precautions with regards to signs of infection. Exercises given to complete at home.

## 2016-07-18 NOTE — Assessment & Plan Note (Signed)
At goal on recheck. Continue current medications. Check labs as outlined below.

## 2016-07-18 NOTE — Patient Instructions (Addendum)
Nice to see you. We will have do exercises at home for your shoulder. Please proceed with the test for your right hand. If you develop redness, increased pain, warmth, fevers, or any new symptoms in her left shoulder please seek medical attention immediately.   Shoulder Impingement Syndrome Rehab Ask your health care provider which exercises are safe for you. Do exercises exactly as told by your health care provider and adjust them as directed. It is normal to feel mild stretching, pulling, tightness, or discomfort as you do these exercises, but you should stop right away if you feel sudden pain or your pain gets worse.Do not begin these exercises until told by your health care provider. Stretching and range of motion exercise This exercise warms up your muscles and joints and improves the movement and flexibility of your shoulder. This exercise also helps to relieve pain and stiffness. Exercise A: Passive horizontal adduction   1. Sit or stand and pull your left / right elbow across your chest, toward your other shoulder. Stop when you feel a gentle stretch in the back of your shoulder and upper arm.  Keep your arm at shoulder height.  Keep your arm as close to your body as you comfortably can. 2. Hold for __________ seconds. 3. Slowly return to the starting position. Repeat __________ times. Complete this exercise __________ times a day. Strengthening exercises These exercises build strength and endurance in your shoulder. Endurance is the ability to use your muscles for a long time, even after they get tired. Exercise B: External rotation, isometric  1. Stand or sit in a doorway, facing the door frame. 2. Bend your left / right elbow and place the back of your wrist against the door frame. Only your wrist should be touching the frame. Keep your upper arm at your side. 3. Gently press your wrist against the door frame, as if you are trying to push your arm away from your abdomen.  Avoid  shrugging your shoulder while you press your hand against the door frame. Keep your shoulder blade tucked down toward the middle of your back. 4. Hold for __________ seconds. 5. Slowly release the tension, and relax your muscles completely before you do the exercise again. Repeat __________ times. Complete this exercise __________ times a day. Exercise C: Internal rotation, isometric   1. Stand or sit in a doorway, facing the door frame. 2. Bend your left / right elbow and place the inside of your wrist against the door frame. Only your wrist should be touching the frame. Keep your upper arm at your side. 3. Gently press your wrist against the door frame, as if you are trying to push your arm toward your abdomen.  Avoid shrugging your shoulder while you press your hand against the door frame. Keep your shoulder blade tucked down toward the middle of your back. 4. Hold for __________ seconds. 5. Slowly release the tension, and relax your muscles completely before you do the exercise again. Repeat __________ times. Complete this exercise __________ times a day. Exercise D: Scapular protraction, supine   1. Lie on your back on a firm surface. Hold a __________ weight in your left / right hand. 2. Raise your left / right arm straight into the air so your hand is directly above your shoulder joint. 3. Push the weight into the air so your shoulder lifts off of the surface that you are lying on. Do not move your head, neck, or back. 4. Hold for __________ seconds.  5. Slowly return to the starting position. Let your muscles relax completely before you repeat this exercise. Repeat __________ times. Complete this exercise __________ times a day. Exercise E: Scapular retraction   1. Sit in a stable chair without armrests, or stand. 2. Secure an exercise band to a stable object in front of you so the band is at shoulder height. 3. Hold one end of the exercise band in each hand. Your palms should face  down. 4. Squeeze your shoulder blades together and move your elbows slightly behind you. Do not shrug your shoulders while you do this. 5. Hold for __________ seconds. 6. Slowly return to the starting position. Repeat __________ times. Complete this exercise __________ times a day. Exercise F: Shoulder extension   1. Sit in a stable chair without armrests, or stand. 2. Secure an exercise band to a stable object in front of you where the band is above shoulder height. 3. Hold one end of the exercise band in each hand. 4. Straighten your elbows and lift your hands up to shoulder height. 5. Squeeze your shoulder blades together and pull your hands down to the sides of your thighs. Stop when your hands are straight down by your sides. Do not let your hands go behind your body. 6. Hold for __________ seconds. 7. Slowly return to the starting position. Repeat __________ times. Complete this exercise __________ times a day. This information is not intended to replace advice given to you by your health care provider. Make sure you discuss any questions you have with your health care provider. Document Released: 02/19/2005 Document Revised: 10/27/2015 Document Reviewed: 01/22/2015 Elsevier Interactive Patient Education  2017 [employer].

## 2016-07-18 NOTE — Assessment & Plan Note (Signed)
Check lipid panel  

## 2016-07-18 NOTE — Progress Notes (Signed)
Tommi Rumps, MD Phone: [phone]  [name] is a 76 y.o. male who presents today for f/u.  Left shoulder pain: Notes no injury. Has been going on for a couple of months. Hurts with abduction, internal rotation, and external rotation. Has been taking Advil and meloxicam. Has never had anything like this previously.  Hypertension: Typical 347-425 systolically. Taking amlodipine, metoprolol. No chest pain, shortness breath, or edema.  His right hand swelling has resolved though he continues to have numbness in the ulnar aspect of the hand and fingers. It was getting a little better though has gotten slightly worse again. He has seen orthopedics for this. He is having nerve conduction studies today.  PMH: nonsmoker.   ROS see history of present illness  Objective  Physical Exam Vitals:   07/18/16 0803 07/18/16 0838  BP: (!) 150/80 122/72  Pulse: 61   Temp: 98 F (36.7 C)     BP Readings from Last 3 Encounters:  07/18/16 122/72  04/26/16 (!) 142/80  01/09/16 138/72   Wt Readings from Last 3 Encounters:  07/18/16 247 lb 3.2 oz (112.1 kg)  04/26/16 257 lb 6.4 oz (116.8 kg)  01/09/16 252 lb 12.8 oz (114.7 kg)    Physical Exam  Constitutional: No distress.  Cardiovascular: Normal rate, regular rhythm and normal heart sounds.   Pulmonary/Chest: Effort normal and breath sounds normal.  Musculoskeletal:  Patient with no tenderness of bilateral shoulders, shoulders are symmetric, left shoulder with discomfort on internal and external rotation actively and passively, discomfort on abduction actively though not passively, positive empty can sign and speeds testing on the left, right shoulder with full range of motion with no discomfort, negative empty can and negative speeds  Neurological: He is alert. Gait normal.  5/5 strength in bilateral biceps, triceps, grip, quads, hamstrings, plantar and dorsiflexion, sensation to light touch intact in bilateral UE and LE  Skin:  Skin is warm and dry. He is not diaphoretic.     Assessment/Plan: Please see individual problem list.  Essential hypertension At goal on recheck. Continue current medications. Check labs as outlined below.  Swelling of right hand Swelling is improved significantly though continues to have some numbness and tingling in the ulnar aspect of the hand. He is following with orthopedics for this and they have ordered nerve conduction studies which will be performed today. Encouraged him to follow up with those.  Hyperlipidemia Check lipid panel.  Rotator cuff impingement syndrome, left History and exam consistent with rotator cuff impingement. Discussed options for treatment with patient. He opted for subacromial injection in the office and home exercises. Please see procedure note for this. Tolerated procedure well and had improvement in his symptoms. Given return precautions with regards to signs of infection. Exercises given to complete at home.   Orders Placed This Encounter  Procedures  . Comp Met (CMET)    Standing Status:   Future    Standing Expiration Date:   07/18/2017  . Lipid panel    Standing Status:   Future    Standing Expiration Date:   07/18/2017    Meds ordered this encounter  Medications  . methylPREDNISolone acetate (DEPO-MEDROL) injection 40 mg    Subacromial Shoulder Injection Procedure Note  Pre-operative Diagnosis: left rotator cuff impingement  Post-operative Diagnosis: same  Indications: Diagnostic and symptom relief  Anesthesia: Topical anesthetic  Procedure Details   Written consent was obtained for the procedure. The shoulder was prepped with Betadine and the skin was anesthetized. A 21 gauge  needle was advanced into the subacromial space through posterior approach without difficulty  The space was then injected with 3 ml 1% lidocaine and 1 ml of Depo-Medrol 40 mg/mL. The injection site was cleansed with isopropyl alcohol and a dressing was  applied.  Complications:  None; patient tolerated the procedure well.   Tommi Rumps, MD [place]

## 2016-07-24 ENCOUNTER — Telehealth: Payer: Self-pay

## 2016-07-24 ENCOUNTER — Other Ambulatory Visit (INDEPENDENT_AMBULATORY_CARE_PROVIDER_SITE_OTHER): Payer: Medicare Other

## 2016-07-24 DIAGNOSIS — I10 Essential (primary) hypertension: Secondary | ICD-10-CM | POA: Diagnosis not present

## 2016-07-24 DIAGNOSIS — N179 Acute kidney failure, unspecified: Secondary | ICD-10-CM

## 2016-07-24 LAB — COMPREHENSIVE METABOLIC PANEL
ALBUMIN: 4.4 g/dL (ref 3.5–5.2)
ALT: 23 U/L (ref 0–53)
AST: 22 U/L (ref 0–37)
Alkaline Phosphatase: 64 U/L (ref 39–117)
BILIRUBIN TOTAL: 0.6 mg/dL (ref 0.2–1.2)
BUN: 21 mg/dL (ref 6–23)
CALCIUM: 9.2 mg/dL (ref 8.4–10.5)
CHLORIDE: 104 meq/L (ref 96–112)
CO2: 29 meq/L (ref 19–32)
CREATININE: 1.26 mg/dL (ref 0.40–1.50)
GFR: 59.01 mL/min — AB (ref >60.00)
Glucose, Bld: 103 mg/dL — ABNORMAL HIGH (ref 70–99)
Potassium: 4.3 mEq/L (ref 3.5–5.1)
Sodium: 139 mEq/L (ref 135–145)
Total Protein: 7.6 g/dL (ref 6.0–8.3)

## 2016-07-24 LAB — LIPID PANEL
CHOLESTEROL: 150 mg/dL (ref 0–200)
HDL: 34.8 mg/dL — AB (ref >39.00)
LDL CALC: 102 mg/dL — AB (ref 0–99)
NONHDL: 115.1
TRIGLYCERIDES: 68 mg/dL (ref 0.0–149.0)
Total CHOL/HDL Ratio: 4
VLDL: 13.6 mg/dL (ref 0.0–40.0)

## 2016-07-24 NOTE — Telephone Encounter (Signed)
Tried calling, no vm 

## 2016-07-24 NOTE — Telephone Encounter (Signed)
-----   Message from Leone Haven, MD sent at 07/24/2016 12:56 PM EDT ----- Please let the patient has kidney function is slightly worse than prior. I suspect this is related to him having taken meloxicam and ibuprofen. I would like to recheck this in a week or so. His cholesterol is stable. Thanks.

## 2016-07-25 NOTE — Telephone Encounter (Signed)
Patient notified and scheduled for lab, please place order

## 2016-07-25 NOTE — Telephone Encounter (Signed)
Ordered

## 2016-08-02 ENCOUNTER — Other Ambulatory Visit: Payer: Self-pay | Admitting: *Deleted

## 2016-08-02 MED ORDER — PANTOPRAZOLE SODIUM 40 MG PO TBEC: 40.0000 mg | DELAYED_RELEASE_TABLET | Freq: Two times a day (BID) | ORAL | 3 refills | Status: DC

## 2016-08-03 ENCOUNTER — Other Ambulatory Visit (INDEPENDENT_AMBULATORY_CARE_PROVIDER_SITE_OTHER): Payer: Medicare Other

## 2016-08-03 DIAGNOSIS — N179 Acute kidney failure, unspecified: Secondary | ICD-10-CM | POA: Diagnosis not present

## 2016-08-03 LAB — BASIC METABOLIC PANEL
BUN: 22 mg/dL (ref 6–23)
CALCIUM: 9.3 mg/dL (ref 8.4–10.5)
CO2: 27 mEq/L (ref 19–32)
CREATININE: 1.29 mg/dL (ref 0.40–1.50)
Chloride: 105 mEq/L (ref 96–112)
GFR: 57.43 mL/min — AB (ref >60.00)
GLUCOSE: 87 mg/dL (ref 70–99)
Potassium: 4.6 mEq/L (ref 3.5–5.1)
Sodium: 139 mEq/L (ref 135–145)

## 2016-08-08 ENCOUNTER — Encounter: Payer: Self-pay | Admitting: *Deleted

## 2016-08-15 ENCOUNTER — Ambulatory Visit
Admission: RE | Admit: 2016-08-15 | Discharge: 2016-08-15 | Disposition: A | Discharge status: Home / Self Care | Payer: Medicare Other | Source: Ambulatory Visit | Attending: Surgery | Admitting: Surgery

## 2016-08-15 ENCOUNTER — Ambulatory Visit: Payer: Medicare Other | Admitting: Anesthesiology

## 2016-08-15 ENCOUNTER — Encounter
Admission: RE | Disposition: A | Discharge status: Home / Self Care | Payer: Self-pay | Source: Ambulatory Visit | Attending: Surgery

## 2016-08-15 DIAGNOSIS — K219 Gastro-esophageal reflux disease without esophagitis: Secondary | ICD-10-CM | POA: Diagnosis not present

## 2016-08-15 DIAGNOSIS — I10 Essential (primary) hypertension: Secondary | ICD-10-CM | POA: Diagnosis not present

## 2016-08-15 DIAGNOSIS — M109 Gout, unspecified: Secondary | ICD-10-CM | POA: Diagnosis not present

## 2016-08-15 DIAGNOSIS — Z79899 Other long term (current) drug therapy: Secondary | ICD-10-CM | POA: Insufficient documentation

## 2016-08-15 DIAGNOSIS — G5601 Carpal tunnel syndrome, right upper limb: Secondary | ICD-10-CM | POA: Insufficient documentation

## 2016-08-15 DIAGNOSIS — Z96653 Presence of artificial knee joint, bilateral: Secondary | ICD-10-CM | POA: Diagnosis not present

## 2016-08-15 DIAGNOSIS — Z7982 Long term (current) use of aspirin: Secondary | ICD-10-CM | POA: Insufficient documentation

## 2016-08-15 HISTORY — DX: Gout, unspecified: M10.9

## 2016-08-15 HISTORY — PX: CARPAL TUNNEL RELEASE: SHX101

## 2016-08-15 SURGERY — RELEASE, CARPAL TUNNEL, ENDOSCOPIC
Anesthesia: Monitor Anesthesia Care | Laterality: Right | Wound class: Clean

## 2016-08-15 MED ORDER — TRAMADOL HCL 50 MG PO TABS: 50.0000 mg | ORAL_TABLET | Freq: Four times a day (QID) | ORAL | 0 refills | Status: DC | PRN

## 2016-08-15 MED ORDER — POTASSIUM CHLORIDE IN NACL 20-0.9 MEQ/L-% IV SOLN: INTRAVENOUS | Status: DC

## 2016-08-15 MED ORDER — ONDANSETRON HCL 4 MG PO TABS: 4.0000 mg | ORAL_TABLET | Freq: Four times a day (QID) | ORAL | Status: DC | PRN

## 2016-08-15 MED ORDER — METOCLOPRAMIDE HCL 5 MG/ML IJ SOLN: 5.0000 mg | Freq: Three times a day (TID) | INTRAMUSCULAR | Status: DC | PRN

## 2016-08-15 MED ORDER — LIDOCAINE HCL (CARDIAC) 20 MG/ML IV SOLN
INTRAVENOUS | Status: DC | PRN
  Administered 2016-08-15: 40 mg via INTRAVENOUS

## 2016-08-15 MED ORDER — LACTATED RINGERS IV SOLN
INTRAVENOUS | Status: DC
  Administered 2016-08-15: 13:00:00 via INTRAVENOUS

## 2016-08-15 MED ORDER — PROPOFOL 500 MG/50ML IV EMUL
INTRAVENOUS | Status: DC | PRN
  Administered 2016-08-15: 50 ug/kg/min via INTRAVENOUS

## 2016-08-15 MED ORDER — METOCLOPRAMIDE HCL 5 MG PO TABS: 5.0000 mg | ORAL_TABLET | Freq: Three times a day (TID) | ORAL | Status: DC | PRN

## 2016-08-15 MED ORDER — CLINDAMYCIN PHOSPHATE 600 MG/4ML IJ SOLN
900.0000 mg | Freq: Once | INTRAMUSCULAR | Status: AC
  Administered 2016-08-15: 900 mg via INTRAVENOUS

## 2016-08-15 MED ORDER — MIDAZOLAM HCL 5 MG/5ML IJ SOLN
INTRAMUSCULAR | Status: DC | PRN
  Administered 2016-08-15: 2 mg via INTRAVENOUS

## 2016-08-15 MED ORDER — FENTANYL CITRATE (PF) 100 MCG/2ML IJ SOLN: 25.0000 ug | INTRAMUSCULAR | Status: DC | PRN

## 2016-08-15 MED ORDER — ONDANSETRON HCL 4 MG/2ML IJ SOLN: 4.0000 mg | Freq: Once | INTRAMUSCULAR | Status: DC | PRN

## 2016-08-15 MED ORDER — BUPIVACAINE HCL (PF) 0.5 % IJ SOLN
INTRAMUSCULAR | Status: DC | PRN
  Administered 2016-08-15: 10 mL

## 2016-08-15 MED ORDER — ONDANSETRON HCL 4 MG/2ML IJ SOLN: 4.0000 mg | Freq: Four times a day (QID) | INTRAMUSCULAR | Status: DC | PRN

## 2016-08-15 MED ORDER — FENTANYL CITRATE (PF) 100 MCG/2ML IJ SOLN
INTRAMUSCULAR | Status: DC | PRN
  Administered 2016-08-15: 50 ug via INTRAVENOUS

## 2016-08-15 SURGICAL SUPPLY — 27 items
BANDAGE ELASTIC 2 LF NS (GAUZE/BANDAGES/DRESSINGS) ×3 IMPLANT
BNDG CMPR MED 5X2 ELC HKLP NS (GAUZE/BANDAGES/DRESSINGS) ×1
BNDG COHESIVE 4X5 TAN STRL (GAUZE/BANDAGES/DRESSINGS) ×3 IMPLANT
BNDG ESMARK 4X12 TAN STRL LF (GAUZE/BANDAGES/DRESSINGS) ×3 IMPLANT
CHLORAPREP W/TINT 26ML (MISCELLANEOUS) ×3 IMPLANT
CORD BIP STRL DISP 12FT (MISCELLANEOUS) ×3 IMPLANT
COVER LIGHT HANDLE UNIVERSAL (MISCELLANEOUS) ×6 IMPLANT
CUFF TOURNIQUET DUAL PORT 18X3 (MISCELLANEOUS) ×3 IMPLANT
DRAPE SURG 17X11 SM STRL (DRAPES) ×3 IMPLANT
GAUZE PETRO XEROFOAM 1X8 (MISCELLANEOUS) ×3 IMPLANT
GAUZE SPONGE 4X4 12PLY STRL (GAUZE/BANDAGES/DRESSINGS) ×3 IMPLANT
GLOVE BIO SURGEON STRL SZ8 (GLOVE) ×3 IMPLANT
GLOVE INDICATOR 8.0 STRL GRN (GLOVE) ×3 IMPLANT
GOWN STRL REUS W/ TWL LRG LVL3 (GOWN DISPOSABLE) ×1 IMPLANT
GOWN STRL REUS W/ TWL XL LVL3 (GOWN DISPOSABLE) ×1 IMPLANT
GOWN STRL REUS W/TWL LRG LVL3 (GOWN DISPOSABLE) ×3
GOWN STRL REUS W/TWL XL LVL3 (GOWN DISPOSABLE) ×3
KIT CARPAL TUNNEL (MISCELLANEOUS) ×3
KIT ESCP INSRT D SLOT CANN KN (MISCELLANEOUS) ×1 IMPLANT
KIT ROOM TURNOVER OR (KITS) ×3 IMPLANT
NS IRRIG 500ML POUR BTL (IV SOLUTION) ×3 IMPLANT
PACK EXTREMITY ARMC (MISCELLANEOUS) ×3 IMPLANT
STOCKINETTE IMPERVIOUS 9X36 MD (GAUZE/BANDAGES/DRESSINGS) ×3 IMPLANT
STRAP BODY AND KNEE 60X3 (MISCELLANEOUS) ×3 IMPLANT
SUT PROLENE 4 0 PS 2 18 (SUTURE) ×3 IMPLANT
SUT VIC AB 3-0 SH 27 (SUTURE)
SUT VIC AB 3-0 SH 27X BRD (SUTURE) IMPLANT

## 2016-08-15 NOTE — Anesthesia Procedure Notes (Signed)
Anesthesia Regional Block: Bier block ([facility])   Pre-Anesthetic Checklist: ,, timeout performed, Correct Patient, Correct Site, Correct Laterality, Correct Procedure, Correct Position, site marked, Risks and benefits discussed,  Surgical consent,  Pre-op evaluation,  At surgeon's request and post-op pain management  Laterality: Right      Procedures:,,,,,,, Esmarch exsanguination, #20gu IV placed and double tourniquet utilized  Narrative:  Start time: 08/15/2016 1:50 PM End time: 08/15/2016 1:51 PM  Performed by: With CRNAs  Anesthesiologist: Ronelle Nigh CRNA: Mayme Genta  Additional Notes: Proximal tourniquet up after exsangination.Negative R radial pulse noted. Injected 84mL 0.5% lidocaine plain to R 22g angio without difficulty. R 22g angio d/c'd after injection.Tolerated well by pt.

## 2016-08-15 NOTE — Anesthesia Preprocedure Evaluation (Signed)
Anesthesia Evaluation  Patient identified by MRN, date of birth, ID band Patient awake    Reviewed: Allergy & Precautions, H&P , NPO status , Patient's Chart, lab work & pertinent test results  Airway Mallampati: III  TM Distance: >3 FB Neck ROM: full    Dental no notable dental hx.    Pulmonary    Pulmonary exam normal        Cardiovascular hypertension, Normal cardiovascular exam     Neuro/Psych    GI/Hepatic GERD  ,  Endo/Other    Renal/GU      Musculoskeletal   Abdominal   Peds  Hematology   Anesthesia Other Findings   Reproductive/Obstetrics                             Anesthesia Physical Anesthesia Plan  ASA: II  Anesthesia Plan: Bier Block and MAC   Post-op Pain Management:    Induction:   PONV Risk Score and Plan: 1 and Ondansetron and Dexamethasone  Airway Management Planned:   Additional Equipment:   Intra-op Plan:   Post-operative Plan:   Informed Consent: I have reviewed the patients History and Physical, chart, labs and discussed the procedure including the risks, benefits and alternatives for the proposed anesthesia with the patient or authorized representative who has indicated his/her understanding and acceptance.     Plan Discussed with:   Anesthesia Plan Comments:         Anesthesia Quick Evaluation

## 2016-08-15 NOTE — Transfer of Care (Signed)
Immediate Anesthesia Transfer of Care Note  Patient: [name]  Procedure(s) Performed: Procedure(s): CARPAL TUNNEL RELEASE ENDOSCOPIC (Right)  Patient Location: PACU  Anesthesia Type: Bier Block, MAC  Level of Consciousness: awake, alert  and patient cooperative  Airway and Oxygen Therapy: Patient Spontanous Breathing and Patient connected to supplemental oxygen  Post-op Assessment: Post-op Vital signs reviewed, Patient's Cardiovascular Status Stable, Respiratory Function Stable, Patent Airway and No signs of Nausea or vomiting  Post-op Vital Signs: Reviewed and stable  Complications: No apparent anesthesia complications

## 2016-08-15 NOTE — Op Note (Signed)
08/15/2016  2:26 PM  Patient:   [name]  Pre-Op Diagnosis:   Right carpal tunnel syndrome.  Post-Op Diagnosis:   Same.  Procedure:   Endoscopic right carpal tunnel release.  Surgeon:   Pascal Lux, MD  Anesthesia:   Bier block  Findings:   As above.  Complications:   None  EBL:   1 cc  Fluids:   750 cc crystalloid  TT:   30 minutes at 250 mmHg  Drains:   None  Closure:   4-0 Prolene interrupted sutures  Brief Clinical Note:   The patient is a 76 year old male with a long history of gradually worsening pain and paresthesias of his right hand. His symptoms have progressed despite medications, activity modification, etc. His history and examination are consistent with carpal tunnel syndrome confirmed by EMG. He presents at this time for an endoscopic right carpal tunnel release.   Procedure:   The patient was brought into the operating room and lain in the supine position. After adequate IV sedation was achieved, a timeout was performed to verify the appropriate surgical site before a Bier block was placed by the anesthesiologist and the tourniquet inflated to 250 mmHg. The right hand and upper extremity were prepped with ChloraPrep solution before being draped sterilely. Preoperative antibiotics were administered. An approximately 1.5-2 cm incision was made over the volar wrist flexion crease, centered over the palmaris longus tendon. The incision was carried down through the subcutaneous tissues with care taken to identify and protect any neurovascular structures. The distal forearm fascia was penetrated just proximal to the transverse carpal ligament. The soft tissues were released off the superficial and deep surfaces of the distal forearm fascia and this was released proximally for 3-4 cm under direct visualization.  Attention was directed distally. The Soil scientist was passed beneath the transverse carpal ligament along the ulnar aspect of the carpal tunnel and used  to release any adhesions as well as to remove any adherent synovial tissue before first the smaller then the larger of the two dilators were passed beneath the transverse carpal ligament along the ulnar margin of the carpal tunnel. The slotted cannula was introduced and the endoscope was placed into the slotted cannula and the undersurface of the transverse carpal ligament visualized. The distal margin of the transverse carpal ligament was marked by placing a 25-gauge needle percutaneously at [place] cardinal point so that it entered the distal portion of the slotted cannula. Under endoscopic visualization, the transverse carpal ligament was released from proximal to distal using the end-cutting blade. A second pass was performed to ensure complete release of the ligament. The adequacy of release was verified both endoscopically and by palpation using the freer elevator.  The wound was irrigated thoroughly with sterile saline solution before being closed using 4-0 Prolene interrupted sutures. A total of 10 cc of 0.5% plain Sensorcaine was injected in and around the incision before a sterile bulky dressing was applied to the wound. The patient was placed into a volar wrist splint before being awakened and returned to the recovery room in satisfactory condition after tolerating the procedure well.

## 2016-08-15 NOTE — Anesthesia Postprocedure Evaluation (Signed)
Anesthesia Post Note  Patient: [name]  Procedure(s) Performed: Procedure(s) (LRB): CARPAL TUNNEL RELEASE ENDOSCOPIC (Right)  Patient location during evaluation: PACU Anesthesia Type: MAC Level of consciousness: awake and alert and oriented Pain management: satisfactory to patient Vital Signs Assessment: post-procedure vital signs reviewed and stable Respiratory status: spontaneous breathing, nonlabored ventilation and respiratory function stable Cardiovascular status: blood pressure returned to baseline and stable Postop Assessment: Adequate PO intake and No signs of nausea or vomiting Anesthetic complications: no    Raliegh Ip

## 2016-08-15 NOTE — H&P (Signed)
Paper H&P to be scanned into permanent record. H&P reviewed and patient re-examined. No changes. 

## 2016-08-15 NOTE — Anesthesia Procedure Notes (Signed)
Performed by: Mayme Genta Pre-anesthesia Checklist: Patient identified, Emergency Drugs available, Suction available, Patient being monitored and Timeout performed Patient Re-evaluated:Patient Re-evaluated prior to inductionOxygen Delivery Method: Simple face mask Placement Confirmation: positive ETCO2 and breath sounds checked- equal and bilateral

## 2016-08-15 NOTE — Discharge Instructions (Signed)
General Anesthesia, Adult, Care After These instructions provide you with information about caring for yourself after your procedure. Your health care provider may also give you more specific instructions. Your treatment has been planned according to current medical practices, but problems sometimes occur. Call your health care provider if you have any problems or questions after your procedure. What can I expect after the procedure? After the procedure, it is common to have:  Vomiting.  A sore throat.  Mental slowness.  It is common to feel:  Nauseous.  Cold or shivery.  Sleepy.  Tired.  Sore or achy, even in parts of your body where you did not have surgery.  Follow these instructions at home: For at least 24 hours after the procedure:  Do not: ? Participate in activities where you could fall or become injured. ? Drive. ? Use heavy machinery. ? Drink alcohol. ? Take sleeping pills or medicines that cause drowsiness. ? Make important decisions or sign legal documents. ? Take care of children on your own.  Rest. Eating and drinking  If you vomit, drink water, juice, or soup when you can drink without vomiting.  Drink enough fluid to keep your urine clear or pale yellow.  Make sure you have little or no nausea before eating solid foods.  Follow the diet recommended by your health care provider. General instructions  Have a responsible adult stay with you until you are awake and alert.  Return to your normal activities as told by your health care provider. Ask your health care provider what activities are safe for you.  Take over-the-counter and prescription medicines only as told by your health care provider.  If you smoke, do not smoke without supervision.  Keep all follow-up visits as told by your health care provider. This is important. Contact a health care provider if:  You continue to have nausea or vomiting at home, and medicines are not helpful.  You  cannot drink fluids or start eating again.  You cannot urinate after 8-12 hours.  You develop a skin rash.  You have fever.  You have increasing redness at the site of your procedure. Get help right away if:  You have difficulty breathing.  You have chest pain.  You have unexpected bleeding.  You feel that you are having a life-threatening or urgent problem. This information is not intended to replace advice given to you by your health care provider. Make sure you discuss any questions you have with your health care provider. Document Released: 05/28/2000 Document Revised: 07/25/2015 Document Reviewed: 02/03/2015 Elsevier Interactive Patient Education  2018 [employer].  Keep dressing dry and intact. Keep hand elevated above heart level. May shower after dressing removed on postop day 4 (Sunday). Cover sutures with Band-Aids after drying off. Apply ice to affected area frequently. Resume Mobic 15 mg daily. Take pain medication as prescribed when needed.  Return for follow-up in 10-14 days or as scheduled.

## 2016-08-16 ENCOUNTER — Encounter: Payer: Self-pay | Admitting: Surgery

## 2016-08-19 ENCOUNTER — Encounter: Payer: Self-pay | Admitting: Family Medicine

## 2016-08-20 ENCOUNTER — Other Ambulatory Visit: Payer: Self-pay | Admitting: Family Medicine

## 2016-08-20 DIAGNOSIS — M109 Gout, unspecified: Secondary | ICD-10-CM

## 2016-08-20 MED ORDER — COLCHICINE 0.6 MG PO TABS: 0.6000 mg | ORAL_TABLET | Freq: Every day | ORAL | 3 refills | Status: DC | PRN

## 2016-10-11 ENCOUNTER — Ambulatory Visit: Payer: Medicare Other

## 2016-10-15 ENCOUNTER — Ambulatory Visit: Payer: Medicare Other

## 2016-10-15 ENCOUNTER — Ambulatory Visit (INDEPENDENT_AMBULATORY_CARE_PROVIDER_SITE_OTHER): Payer: Medicare Other | Admitting: Family Medicine

## 2016-10-15 ENCOUNTER — Encounter: Payer: Self-pay | Admitting: Family Medicine

## 2016-10-15 VITALS — BP 140/88 | HR 72 | Temp 97.8°F | Resp 15 | Ht 68.0 in | Wt 250.0 lb

## 2016-10-15 DIAGNOSIS — R195 Other fecal abnormalities: Secondary | ICD-10-CM | POA: Insufficient documentation

## 2016-10-15 DIAGNOSIS — B354 Tinea corporis: Secondary | ICD-10-CM | POA: Diagnosis not present

## 2016-10-15 DIAGNOSIS — G5601 Carpal tunnel syndrome, right upper limb: Secondary | ICD-10-CM

## 2016-10-15 DIAGNOSIS — I10 Essential (primary) hypertension: Secondary | ICD-10-CM | POA: Diagnosis not present

## 2016-10-15 DIAGNOSIS — Z0000 Encounter for general adult medical examination without abnormal findings: Secondary | ICD-10-CM

## 2016-10-15 DIAGNOSIS — K219 Gastro-esophageal reflux disease without esophagitis: Secondary | ICD-10-CM | POA: Diagnosis not present

## 2016-10-15 NOTE — Assessment & Plan Note (Signed)
Controlled for age. Continue current medications. 

## 2016-10-15 NOTE — Assessment & Plan Note (Signed)
Well controlled. Continue current medication.  

## 2016-10-15 NOTE — Patient Instructions (Addendum)
  [name] , Thank you for taking time to come for your Medicare Wellness Visit. I appreciate your ongoing commitment to your health goals. Please review the following plan we discussed and let me know if I can assist you in the future.   Follow up with Dr. Caryl Bis as needed.    Have a great day!  These are the goals we discussed: Goals    . Weight (lb) < 200 lb (90.7 kg)          Stay hydrated Low carb foods Stay active and continue walking for exercise       This is a list of the screening recommended for you and due dates:  Health Maintenance  Topic Date Due  . Flu Shot  10/03/2016  . Tetanus Vaccine  08/12/2024  . Pneumonia vaccines  Completed

## 2016-10-15 NOTE — Assessment & Plan Note (Signed)
Status post carpal tunnel release. Discussed discontinuing meloxicam. Continue to monitor and follow with orthopedics.

## 2016-10-15 NOTE — Assessment & Plan Note (Signed)
Suspect this is related to meloxicam. Benign abdominal exam. He'll continue to monitor and if not improving let us know.

## 2016-10-15 NOTE — Progress Notes (Signed)
Subjective:   [name] is a 76 y.o. male who presents for Medicare Annual/Subsequent preventive examination.  Review of Systems:  No ROS.  Medicare Wellness Visit. Additional risk factors are reflected in the social history. Cardiac Risk Factors include: advanced age (>31men, >98 women);male gender;obesity (BMI >30kg/m2);hypertension     Objective:    Vitals: BP 140/88 (BP Location: Left Arm, Patient Position: Sitting, Cuff Size: Normal)   Pulse 72   Temp 97.8 F (36.6 C) (Oral)   Resp 15   Ht 5\' 8"  (1.727 m)   Wt 250 lb (113.4 kg)   SpO2 97%   BMI 38.01 kg/m   Body mass index is 38.01 kg/m.  Tobacco History  Smoking Status  . Never Smoker  Smokeless Tobacco  . Never Used     Counseling given: Not Answered   Past Medical History:  Diagnosis Date  . Anxiety   . Elevated PSA   . Gout   . Hyperlipidemia   . Hypertension    Past Surgical History:  Procedure Laterality Date  . BACK SURGERY     L4/5  . CARPAL TUNNEL RELEASE Right 08/15/2016   Procedure: CARPAL TUNNEL RELEASE ENDOSCOPIC;  Surgeon: Corky Mull, MD;  Location: [facility];  Service: Orthopedics;  Laterality: Right;  . ESOPHAGOGASTRODUODENOSCOPY  11-21-06   Esophagitis, Stricture/dilated   . LYMPH NODE BIOPSY  1957   benign  . Right arm fracture    . TONSILLECTOMY    . TOTAL KNEE ARTHROPLASTY  09-02-02   Bilateral   Family History  Problem Relation Age of Onset  . Ulcers Father   . Diverticulitis Father   . Arthritis Father   . Hypertension Brother   . Obesity Brother   . Cancer Brother        throat  . Arthritis Mother   . Depression Mother        Anxiety   History  Sexual Activity  . Sexual activity: No    Outpatient Encounter Prescriptions as of 10/15/2016  Medication Sig  . acyclovir ointment (ZOVIRAX) 5 % Apply topically every 3 (three) hours. As needed for cold sores  . amLODipine (NORVASC) 5 MG tablet Take 1 tablet (5 mg total) by mouth daily.  Marland Kitchen aspirin 81 MG  tablet Take 81 mg by mouth daily.  . colchicine (COLCRYS) 0.6 MG tablet Take 1 tablet (0.6 mg total) by mouth daily as needed (When having a gout flare up).  . fluocinonide (LIDEX) 0.05 % cream Apply topically daily.    . meloxicam (MOBIC) 15 MG tablet TAKE 1 TABLET (15 MG TOTAL) BY MOUTH ONCE DAILY.  . metoprolol succinate (TOPROL-XL) 50 MG 24 hr tablet TAKE 1 TABLET BY MOUTH DAILY IMMEDIATELY FOLLOWING A MEAL  . Multiple Vitamins-Minerals ([employer] SPECTRAVITE SENIOR) TABS Take by mouth. 1 by mouth every am   . Omega-3 Fatty Acids (THERAGRAN-M FISH OIL CONC) 1200 MG CAPS Take by mouth daily.    . pantoprazole (PROTONIX) 40 MG tablet Take 1 tablet (40 mg total) by mouth 2 (two) times daily.  . traMADol (ULTRAM) 50 MG tablet Take 1 tablet (50 mg total) by mouth every 6 (six) hours as needed.   No facility-administered encounter medications on file as of 10/15/2016.     Activities of Daily Living In your present state of health, do you have any difficulty performing the following activities: 10/15/2016 08/15/2016  Hearing? N N  Vision? N N  Difficulty concentrating or making decisions? N N  Walking  or climbing stairs? N N  Dressing or bathing? N N  Doing errands, shopping? N -  Preparing Food and eating ? N -  Using the Toilet? N -  In the past six months, have you accidently leaked urine? N -  Do you have problems with loss of bowel control? N -  Managing your Medications? N -  Managing your Finances? N -  Housekeeping or managing your Housekeeping? N -  Some recent data might be hidden    Patient Care Team: Leone Haven, MD as PCP - General (Family Medicine) Dasher, Rayvon Char, MD (Dermatology) Birder Robson, MD as Referring Physician (Ophthalmology) Gaynelle Arabian, MD (Orthopedic Surgery)   Assessment:    This is a routine wellness examination for [facility]. The goal of the wellness visit is to assist the patient how to close the gaps in care and create a preventative care plan for  the patient.   The roster of all physicians providing medical care to patient is listed in the Snapshot section of the chart.  Osteoporosis risk reviewed.    Safety issues reviewed; Smoke and carbon monoxide detectors in the home. No firearms in the home.  Wears seatbelts when driving or riding with others. Patient does wear sunscreen or protective clothing when in direct sunlight. No violence in the home.  Patient is alert, normal appearance, oriented to person/place/and time.  Correctly identified the president of the [place], recall of 3/3 words, and performing simple calculations. Displays appropriate judgement and can read correct time from watch face.   No new identified risk were noted.  No failures at ADL's or IADL's.   BMI- discussed the importance of a healthy diet, water intake and the benefits of aerobic exercise. Educational material provided.   24 hour diet recall: Breakfast: cereal, fruit Lunch: meat pizza  Dinner: salad  Daily fluid intake: 1 cups of caffeine, 4 cups of water  Dental- every 6 months.  Eye- Visual acuity not assessed per patient preference since they have regular follow up with the ophthalmologist.  Wears corrective lenses.  Sleep patterns- Sleeps 7-8 hours at night.  Wakes feeling rested.    Health maintenance gaps- closed.  Patient Concerns: None at this time. Follow up with PCP as needed.  Exercise Activities and Dietary recommendations Current Exercise Habits: Home exercise routine, Type of exercise: walking;stretching (Works 5 days a week, 38 hours. Walking, lifting.), Time (Minutes): 20, Frequency (Times/Week): 5, Weekly Exercise (Minutes/Week): 100, Intensity: Mild  Goals    . Weight (lb) < 200 lb (90.7 kg)          Stay hydrated Low carb foods Stay active and continue walking for exercise      Fall Risk Fall Risk  10/15/2016 10/15/2016 10/12/2015 07/20/2015 07/16/2014  Falls in the past year? No No No No No   Depression Screen PHQ  2/9 Scores 10/15/2016 10/12/2015 07/20/2015 07/16/2014  PHQ - 2 Score 0 0 0 0  PHQ- 9 Score 0 - - -    Cognitive Function MMSE - Mini Mental State Exam 10/15/2016 10/12/2015  Orientation to time 5 5  Orientation to Place 5 5  Registration 3 3  Attention/ Calculation 5 5  Recall 3 3  Language- name 2 objects 2 2  Language- repeat 1 1  Language- follow 3 step command 3 3  Language- read & follow direction 1 1  Write a sentence 1 1  Copy design 1 1  Total score 30 30  Immunization History  Administered Date(s) Administered  . Influenza Whole 01/03/2006, 12/24/2011  . Influenza,inj,Quad PF,36+ Mos 12/15/2012, 10/28/2014  . Influenza-Unspecified 12/25/2013, 11/08/2015  . Pneumococcal Conjugate-13 06/30/2013  . Pneumococcal Polysaccharide-23 06/21/2006  . Td 08/19/2002  . Tdap 08/13/2014  . Zoster 12/28/2009   Screening Tests Health Maintenance  Topic Date Due  . INFLUENZA VACCINE  10/03/2016  . TETANUS/TDAP  08/12/2024  . PNA vac Low Risk Adult  Completed      Plan:   End of life planning; Advanced aging; Advanced directives discussed.  No HCPOA/Living Will.  Additional information declined at this time.  I have personally reviewed and noted the following in the patient's chart:   . Medical and social history . Use of alcohol, tobacco or illicit drugs  . Current medications and supplements . Functional ability and status . Nutritional status . Physical activity . Advanced directives . List of other physicians . Hospitalizations, surgeries, and ER visits in previous 12 months . Vitals . Screenings to include cognitive, depression, and falls . Referrals and appointments  In addition, I have reviewed and discussed with patient certain preventive protocols, quality metrics, and best practice recommendations. A written personalized care plan for preventive services as well as general preventive health recommendations were provided to patient.     Varney Biles, LPN  08/30/3660

## 2016-10-15 NOTE — Patient Instructions (Signed)
Nice to see you. Please try over-the-counter Lotrimin for your rash. It may be related to ringworm. Please discontinue the meloxicam. Please monitor the loose stools if these do not improve please let us know.

## 2016-10-15 NOTE — Progress Notes (Signed)
  Tommi Rumps, MD Phone: [phone]  [name] is a 76 y.o. male who presents today for f/u.  HYPERTENSION  Disease Monitoring  Home BP Monitoring similar to today Chest pain- no    Dyspnea- no Medications  Compliance-  Taking amlodpine, metoprolol.   Edema- no  Carpal tunnel syndrome: Patient had right carpal tunnel release. Was on meloxicam. He notes the numbness has improved. No weakness. No pain.  Patient notes some loose stools after being on the meloxicam. No abdominal pain. No blood in stool. No nausea or vomiting.  GERD: Taking Protonix. Notes no swallowing issues or reflux symptoms. No abdominal pain or blood in his stool. He notes Protonix has helped significantly.  Patient notes recently developing a circular rash on his right mid medial calf. Notes it just popped up. No itching or fevers. Has had it before and it has gone away on its own. He notes he feels well at this time.  PMH: nonsmoker.   ROS see history of present illness  Objective  Physical Exam Vitals:   10/15/16 0837  BP: 140/88  Pulse: 72  Temp: 97.8 F (36.6 C)  SpO2: 96%    BP Readings from Last 3 Encounters:  10/15/16 140/88  08/15/16 138/77  07/18/16 122/72   Wt Readings from Last 3 Encounters:  10/15/16 250 lb 3.2 oz (113.5 kg)  08/15/16 240 lb (108.9 kg)  07/18/16 247 lb 3.2 oz (112.1 kg)    Physical Exam  Constitutional: No distress.  Cardiovascular: Normal rate, regular rhythm and normal heart sounds.   Pulmonary/Chest: Effort normal and breath sounds normal.  Abdominal: Soft. Bowel sounds are normal. He exhibits no distension. There is no tenderness. There is no rebound and no guarding.  Musculoskeletal: He exhibits no edema.  Neurological: He is alert. Gait normal.  Skin: Skin is warm and dry. He is not diaphoretic.  Right mid medial calf with circular-appearing erythematous lesion with raised edges and mild scaling with central clearing     Assessment/Plan: Please  see individual problem list.  Essential hypertension Controlled for age. Continue current medications.  GERD (gastroesophageal reflux disease) Well-controlled. Continue current medication.  Carpal tunnel syndrome Status post carpal tunnel release. Discussed discontinuing meloxicam. Continue to monitor and follow with orthopedics.  Loose stools Suspect this is related to meloxicam. Benign abdominal exam. He'll continue to monitor and if not improving let us know.  Tinea corporis Rash most consistent with tinea corporis. He will try Lotrimin over-the-counter. If not improving or if it spreads or he develops new symptoms he'll be reevaluated.   No orders of the defined types were placed in this encounter.   Meds ordered this encounter  Medications  . meloxicam (MOBIC) 15 MG tablet    Sig: TAKE 1 TABLET (15 MG TOTAL) BY MOUTH ONCE DAILY.    Refill:  [place], MD [place]

## 2016-10-15 NOTE — Assessment & Plan Note (Signed)
Rash most consistent with tinea corporis. He will try Lotrimin over-the-counter. If not improving or if it spreads or he develops new symptoms he'll be reevaluated.

## 2016-10-16 ENCOUNTER — Encounter: Payer: Self-pay | Admitting: Family Medicine

## 2016-10-28 ENCOUNTER — Other Ambulatory Visit: Payer: Self-pay | Admitting: Family Medicine

## 2016-10-28 DIAGNOSIS — I10 Essential (primary) hypertension: Secondary | ICD-10-CM

## 2016-11-13 ENCOUNTER — Other Ambulatory Visit: Payer: Self-pay | Admitting: Family Medicine

## 2016-12-17 ENCOUNTER — Encounter: Payer: Self-pay | Admitting: Gastroenterology

## 2017-01-30 ENCOUNTER — Encounter: Payer: Self-pay | Admitting: Gastroenterology

## 2017-02-13 ENCOUNTER — Other Ambulatory Visit: Payer: Self-pay | Admitting: Family Medicine

## 2017-02-13 DIAGNOSIS — I10 Essential (primary) hypertension: Secondary | ICD-10-CM

## 2017-03-22 ENCOUNTER — Ambulatory Visit (AMBULATORY_SURGERY_CENTER): Payer: Self-pay | Admitting: *Deleted

## 2017-03-22 ENCOUNTER — Encounter: Payer: Self-pay | Admitting: Gastroenterology

## 2017-03-22 ENCOUNTER — Other Ambulatory Visit: Payer: Self-pay

## 2017-03-22 VITALS — Ht 69.0 in | Wt 261.8 lb

## 2017-03-22 DIAGNOSIS — Z1211 Encounter for screening for malignant neoplasm of colon: Secondary | ICD-10-CM

## 2017-03-22 MED ORDER — NA SULFATE-K SULFATE-MG SULF 17.5-3.13-1.6 GM/177ML PO SOLN: 1.0000 [IU] | Freq: Once | ORAL | 0 refills | Status: AC

## 2017-03-22 NOTE — Progress Notes (Signed)
No egg or soy allergy known to patient  No issues with past sedation with any surgeries  or procedures, no intubation problems  No diet pills per patient No home 02 use per patient  No blood thinners per patient  Pt denies issues with constipation  No A fib or A flutter  EMMI video sent to pt's e mail  Pt. declined 

## 2017-04-02 ENCOUNTER — Encounter: Payer: Self-pay | Admitting: Gastroenterology

## 2017-04-02 ENCOUNTER — Other Ambulatory Visit: Payer: Self-pay

## 2017-04-02 ENCOUNTER — Ambulatory Visit (AMBULATORY_SURGERY_CENTER): Payer: Medicare Other | Admitting: Gastroenterology

## 2017-04-02 VITALS — BP 123/66 | HR 70 | Temp 97.3°F | Resp 14 | Ht 69.0 in | Wt 261.0 lb

## 2017-04-02 DIAGNOSIS — D125 Benign neoplasm of sigmoid colon: Secondary | ICD-10-CM

## 2017-04-02 DIAGNOSIS — K635 Polyp of colon: Secondary | ICD-10-CM

## 2017-04-02 DIAGNOSIS — Z1212 Encounter for screening for malignant neoplasm of rectum: Secondary | ICD-10-CM | POA: Diagnosis not present

## 2017-04-02 DIAGNOSIS — Z1211 Encounter for screening for malignant neoplasm of colon: Secondary | ICD-10-CM | POA: Diagnosis present

## 2017-04-02 MED ORDER — SODIUM CHLORIDE 0.9 % IV SOLN
500.0000 mL | Freq: Once | INTRAVENOUS | Status: DC
Start: 2017-04-02 — End: 2023-02-08

## 2017-04-02 NOTE — Procedures (Signed)
Pt's states no medical or surgical changes since previsit or office visit. 

## 2017-04-02 NOTE — Progress Notes (Signed)
Called to room to assist during endoscopic procedure.  Patient ID and intended procedure confirmed with present staff. Received instructions for my participation in the procedure from the performing physician.  

## 2017-04-02 NOTE — Progress Notes (Signed)
Report given to PACU, vss 

## 2017-04-02 NOTE — Patient Instructions (Signed)
YOU HAD AN ENDOSCOPIC PROCEDURE TODAY AT [facility]:   Refer to the procedure report that was given to you for any specific questions about what was found during the examination.  If the procedure report does not answer your questions, please call your gastroenterologist to clarify.  If you requested that your care partner not be given the details of your procedure findings, then the procedure report has been included in a sealed envelope for you to review at your convenience later.  YOU SHOULD EXPECT: Some feelings of bloating in the abdomen. Passage of more gas than usual.  Walking can help get rid of the air that was put into your GI tract during the procedure and reduce the bloating. If you had a lower endoscopy (such as a colonoscopy or flexible sigmoidoscopy) you may notice spotting of blood in your stool or on the toilet paper. If you underwent a bowel prep for your procedure, you may not have a normal bowel movement for a few days.  Please Note:  You might notice some irritation and congestion in your nose or some drainage.  This is from the oxygen used during your procedure.  There is no need for concern and it should clear up in a day or so.  SYMPTOMS TO REPORT IMMEDIATELY:   Following lower endoscopy (colonoscopy or flexible sigmoidoscopy):  Excessive amounts of blood in the stool  Significant tenderness or worsening of abdominal pains  Swelling of the abdomen that is new, acute  Fever of 100F or higher  Please read all handouts given to you by your recovery nurse on polyps and Hemorrhoids .  For urgent or emergent issues, a gastroenterologist can be reached at any hour by calling [phone].   DIET:  We do recommend a small meal at first, but then you may proceed to your regular diet.  Drink plenty of fluids but you should avoid alcoholic beverages for 24 hours.  ACTIVITY:  You should plan to take it easy for the rest of today and you should NOT DRIVE or use  heavy machinery until tomorrow (because of the sedation medicines used during the test).    FOLLOW UP: Our staff will call the number listed on your records the next business day following your procedure to check on you and address any questions or concerns that you may have regarding the information given to you following your procedure. If we do not reach you, we will leave a message.  However, if you are feeling well and you are not experiencing any problems, there is no need to return our call.  We will assume that you have returned to your regular daily activities without incident.  If any biopsies were taken you will be contacted by phone or by letter within the next 1-3 weeks.  Please call us at [phone] if you have not heard about the biopsies in 3 weeks.    SIGNATURES/CONFIDENTIALITY: You and/or your care partner have signed paperwork which will be entered into your electronic medical record.  These signatures attest to the fact that that the information above on your After Visit Summary has been reviewed and is understood.  Full responsibility of the confidentiality of this discharge information lies with you and/or your care-partner.  Thank you for letting us take care of your healthcare needs today.

## 2017-04-02 NOTE — Op Note (Signed)
[place] Patient Name: [name] Procedure Date: 04/02/2017 9:01 AM MRN: [id number] Endoscopist: Remo Lipps P. Armbruster MD, MD Age: 77 Referring MD:  Date of Birth: 1940/03/09 Gender: Male Account #: [id number] Procedure:                Colonoscopy Indications:              Screening for colorectal malignant neoplasm Medicines:                Monitored Anesthesia Care Procedure:                Pre-Anesthesia Assessment:                           - Prior to the procedure, a History and Physical                            was performed, and patient medications and                            allergies were reviewed. The patient's tolerance of                            previous anesthesia was also reviewed. The risks                            and benefits of the procedure and the sedation                            options and risks were discussed with the patient.                            All questions were answered, and informed consent                            was obtained. Prior Anticoagulants: The patient has                            taken no previous anticoagulant or antiplatelet                            agents. ASA Grade Assessment: II - A patient with                            mild systemic disease. After reviewing the risks                            and benefits, the patient was deemed in                            satisfactory condition to undergo the procedure.                           After obtaining informed consent, the colonoscope  was passed under direct vision. Throughout the                            procedure, the patient's blood pressure, pulse, and                            oxygen saturations were monitored continuously. The                            Colonoscope was introduced through the anus and                            advanced to the the cecum, identified by                            appendiceal orifice and  ileocecal valve. The                            colonoscopy was performed without difficulty. The                            patient tolerated the procedure well. The quality                            of the bowel preparation was good. The ileocecal                            valve, appendiceal orifice, and rectum were                            photographed. Scope In: 9:14:56 AM Scope Out: 9:31:21 AM Scope Withdrawal Time: 0 hours 10 minutes 25 seconds  Total Procedure Duration: 0 hours 16 minutes 25 seconds  Findings:                 The perianal and digital rectal examinations were                            normal.                           A 4 mm polyp was found in the sigmoid colon. The                            polyp was sessile. The polyp was removed with a                            cold snare. Resection and retrieval were complete.                           Many small and large-mouthed diverticula were found                            in the entire colon. Severe diverticulosis with  luminal narrowing of the sigmoid colon, mild                            diverticulosis in the transverse and right colon.                           Internal hemorrhoids were found during retroflexion.                           The exam was otherwise without abnormality. Complications:            No immediate complications. Estimated blood loss:                            Minimal. Estimated Blood Loss:     Estimated blood loss was minimal. Impression:               - One 4 mm polyp in the sigmoid colon, removed with                            a cold snare. Resected and retrieved.                           - Diverticulosis in the entire examined colon,                            severe in the left colon with associated luminal                            narrowing.                           - Internal hemorrhoids.                           - The examination was otherwise  normal. Recommendation:           - Patient has a contact number available for                            emergencies. The signs and symptoms of potential                            delayed complications were discussed with the                            patient. Return to normal activities tomorrow.                            Written discharge instructions were provided to the                            patient.                           - Resume previous diet.                           -  Continue present medications.                           - Await pathology results.                           - Repeat colonoscopy is recommended for                            surveillance. The colonoscopy date will be                            determined after pathology results from today's                            exam become available for review. Remo Lipps P. Armbruster MD, MD 04/02/2017 9:35:33 AM This report has been signed electronically.

## 2017-04-03 ENCOUNTER — Telehealth: Payer: Self-pay | Admitting: *Deleted

## 2017-04-03 NOTE — Telephone Encounter (Signed)
  Follow up Call-  Call back number 04/02/2017  Post procedure Call Back phone  # [phone]  Permission to leave phone message Yes  Some recent data might be hidden     Patient questions:  Do you have a fever, pain , or abdominal swelling? No. Pain Score  0 *  Have you tolerated food without any problems? Yes.    Have you been able to return to your normal activities? Yes.    Do you have any questions about your discharge instructions: Diet   No. Medications  No. Follow up visit  No.  Do you have questions or concerns about your Care? No.  Actions: * If pain score is 4 or above: No action needed, pain <4.

## 2017-04-07 ENCOUNTER — Encounter: Payer: Self-pay | Admitting: Gastroenterology

## 2017-05-17 ENCOUNTER — Other Ambulatory Visit: Payer: Self-pay | Admitting: Family Medicine

## 2017-06-06 ENCOUNTER — Encounter: Payer: Self-pay | Admitting: Family Medicine

## 2017-06-06 ENCOUNTER — Ambulatory Visit: Payer: Medicare Other | Admitting: Family Medicine

## 2017-06-06 VITALS — BP 124/66 | HR 59 | Temp 97.8°F | Resp 15 | Wt 250.5 lb

## 2017-06-06 DIAGNOSIS — R300 Dysuria: Secondary | ICD-10-CM | POA: Diagnosis not present

## 2017-06-06 DIAGNOSIS — R319 Hematuria, unspecified: Secondary | ICD-10-CM | POA: Diagnosis not present

## 2017-06-06 LAB — BASIC METABOLIC PANEL WITH GFR
BUN: 25 mg/dL — ABNORMAL HIGH (ref 6–23)
CO2: 28 meq/L (ref 19–32)
Calcium: 9.1 mg/dL (ref 8.4–10.5)
Chloride: 104 meq/L (ref 96–112)
Creatinine, Ser: 1.29 mg/dL (ref 0.40–1.50)
GFR: 57.3 mL/min — ABNORMAL LOW
Glucose, Bld: 93 mg/dL (ref 70–99)
Potassium: 4.3 meq/L (ref 3.5–5.1)
Sodium: 139 meq/L (ref 135–145)

## 2017-06-06 LAB — POC URINALSYSI DIPSTICK (AUTOMATED)
Bilirubin, UA: NEGATIVE
Glucose, UA: NEGATIVE
Ketones, UA: NEGATIVE
Leukocytes, UA: NEGATIVE
Nitrite, UA: NEGATIVE
Spec Grav, UA: >=1.03 — AB
Urobilinogen, UA: 0.2 U/dL
pH, UA: 5

## 2017-06-06 LAB — URINALYSIS, MICROSCOPIC ONLY

## 2017-06-06 MED ORDER — CIPROFLOXACIN HCL 250 MG PO TABS: 250.0000 mg | ORAL_TABLET | Freq: Two times a day (BID) | ORAL | 0 refills | Status: DC

## 2017-06-06 NOTE — Progress Notes (Addendum)
Patient ID: [name], male   DOB: [birth year], 77 y.o.   MRN: [id number]    PCP: Leone Haven, MD  Subjective:  [name] is a 77 y.o. year old very pleasant male patient who presents with Urinary Tract symptoms: symptoms including dysuria, polyuria, nocturia, urgency, and darker urine. He reports seeing a drop of blood in the urinal at work and has noticed that urine is darker at home.   -started: 3 days, symptoms are not improving  -previous treatments: None -He reports that he drinking very little water and states that he does not drink more than one or two glasses of water a day. He drinks approximately 3 sodas a day. He is due to see his urologist due to an elevated PSA which has been present for "many years." He believes his urology provider  is Dr. Diona Fanti in [place].   ROS-denies fever, chills, sweats, N/V, flank pain, abdominal pain  Pertinent Past Medical History- HTN, GERD, Elevated PSA  Medications- reviewed  Current Outpatient Medications  Medication Sig Dispense Refill  . acyclovir ointment (ZOVIRAX) 5 % Apply topically every 3 (three) hours. As needed for cold sores 30 g 3  . amLODipine (NORVASC) 5 MG tablet TAKE 1 TABLET (5 MG TOTAL) BY MOUTH DAILY. 90 tablet 1  . aspirin 81 MG tablet Take 81 mg by mouth daily.    . clindamycin (CLEOCIN) 150 MG capsule Take 150 mg by mouth 3 (three) times daily. Prior to dental work    . colchicine (COLCRYS) 0.6 MG tablet Take 1 tablet (0.6 mg total) by mouth daily as needed (When having a gout flare up). 30 tablet 3  . fluocinonide (LIDEX) 0.05 % cream Apply topically daily.      . meloxicam (MOBIC) 15 MG tablet TAKE 1 TABLET (15 MG TOTAL) BY MOUTH ONCE DAILY.  1  . metoprolol succinate (TOPROL-XL) 50 MG 24 hr tablet Take 1 tablet (50 mg total) by mouth daily. APPT NEEDED FOR FURTHER REFILLS. PLEASE CALL OFFICE SOON. 30 tablet 0  . Misc Natural Products (TART CHERRY ADVANCED) CAPS Take 425 capsules by mouth daily.    .  Multiple Vitamins-Minerals ([employer]S SPECTRAVITE SENIOR) TABS Take by mouth. 1 by mouth every am     . Omega-3 Fatty Acids (THERAGRAN-M FISH OIL CONC) 1200 MG CAPS Take by mouth daily.      . pantoprazole (PROTONIX) 40 MG tablet Take 1 tablet (40 mg total) by mouth 2 (two) times daily. 180 tablet 3  . traMADol (ULTRAM) 50 MG tablet Take 1 tablet (50 mg total) by mouth every 6 (six) hours as needed. 20 tablet 0   Current Facility-Administered Medications  Medication Dose Route Frequency Provider Last Rate Last Dose  . 0.9 %  sodium chloride infusion  500 mL Intravenous Once Armbruster, Carlota Raspberry, MD        Objective: BP 124/66 (BP Location: Left Arm, Patient Position: Sitting, Cuff Size: Large)   Pulse (!) 59   Temp 97.8 F (36.6 C) (Oral)   Resp 15   Wt 250 lb 8 oz (113.6 kg)   SpO2 95%   BMI 36.99 kg/m  Gen: NAD, resting comfortably HEENT: oropharynx is clear and moist CV: RRR no murmurs rubs or gallops Lungs: CTAB no crackles, wheeze, rhonchi Abdomen: soft/nontender/nondistended/normal bowel sounds. No rebound or guarding.  No CVA tenderness.   Suprapubic tenderness not present Ext: no edema Skin: warm, dry, no rash Neuro: grossly normal, moves all extremities  Assessment/Plan: 1. Hematuria,  unspecified type  - POCT Urinalysis Dipstick (Automated) - Basic metabolic panel - Ambulatory referral to Urology  2. Dysuria  - ciprofloxacin (CIPRO) 250 MG tablet; Take 1 tablet (250 mg total) by mouth 2 (two) times daily.  Dispense: 14 tablet; Refill: 0 - Urine Culture - Urine Microscopic   UA indicates 3+ blood, and protein 1+; negative for leukocytes and nitrites. History and exam are concerning due to blood in urine and past history in 2016 of a renal stone. No pain present today, no CVA tenderness. History of elevated PSA is also present. With symptoms of dysuria will initiate empiric treatment of Cipro with history of prostate and will send for culture. Will obtain a BMP today as  well. Renal dosed Cipro based upon last BMP almost one year ago with GFR < 60.  Urgent referral to urology provided today due to hematuria and elevated PSA.   Advised patient to complete antibiotic and he should follow up if his symptoms do not improve in 2 to 3 days, worsen, he develops a fever >101, or back pain. Also advised him to increase water intake and avoid sodas.  Patient voiced understanding and agreed with plan.  Finally, we reviewed reasons to return to care including if symptoms worsen or persist or new concerns arise- once again particularly fever, N/V, or flank pain.   Laurita Quint, FNP

## 2017-06-06 NOTE — Patient Instructions (Signed)
Please take medication as directed and you will be contacted about the results of your urine culture.  Also, you have been referred to urology, if you do not hear within  7 to 10 days, please contact this office for further information.  Please complete antibiotic as directed. If you develop symptoms of fever >101, pain in your back, or do not improve with treatment that has been provided in 3 to 4 days, please follow up for further evaluation and treatment.  Dysuria Dysuria is pain or discomfort while urinating. The pain or discomfort may be felt in the tube that carries urine out of the bladder (urethra) or in the surrounding tissue of the genitals. The pain may also be felt in the groin area, lower abdomen, and lower back. You may have to urinate frequently or have the sudden feeling that you have to urinate (urgency). Dysuria can affect both men and women, but is more common in women. Dysuria can be caused by many different things, including:  Urinary tract infection in women.  Infection of the kidney or bladder.  Kidney stones or bladder stones.  Certain sexually transmitted infections (STIs), such as chlamydia.  Dehydration.  Inflammation of the vagina.  Use of certain medicines.  Use of certain soaps or scented products that cause irritation.  Follow these instructions at home: Watch your dysuria for any changes. The following actions may help to reduce any discomfort you are feeling:  Drink enough fluid to keep your urine clear or pale yellow.  Empty your bladder often. Avoid holding urine for long periods of time.  After a bowel movement or urination, women should cleanse from front to back, using each tissue only once.  Empty your bladder after sexual intercourse.  Take medicines only as directed by your health care provider.  If you were prescribed an antibiotic medicine, finish it all even if you start to feel better.  Avoid caffeine, tea, and alcohol. They can  irritate the bladder and make dysuria worse. In men, alcohol may irritate the prostate.  Keep all follow-up visits as directed by your health care provider. This is important.  If you had any tests done to find the cause of dysuria, it is your responsibility to obtain your test results. Ask the lab or department performing the test when and how you will get your results. Talk with your health care provider if you have any questions about your results.  Contact a health care provider if:  You develop pain in your back or sides.  You have a fever.  You have nausea or vomiting.  You have blood in your urine.  You are not urinating as often as you usually do. Get help right away if:  You pain is severe and not relieved with medicines.  You are unable to hold down any fluids.  You or someone else notices a change in your mental function.  You have a rapid heartbeat at rest.  You have shaking or chills.  You feel extremely weak. This information is not intended to replace advice given to you by your health care provider. Make sure you discuss any questions you have with your health care provider. Document Released: 11/18/2003 Document Revised: 07/28/2015 Document Reviewed: 10/15/2013 Elsevier Interactive Patient Education  [employer].

## 2017-06-07 LAB — URINE CULTURE
MICRO NUMBER:: 90417864
Result:: NO GROWTH
SPECIMEN QUALITY: ADEQUATE

## 2017-06-11 ENCOUNTER — Other Ambulatory Visit: Payer: Self-pay | Admitting: Family Medicine

## 2017-06-19 ENCOUNTER — Encounter: Payer: Self-pay | Admitting: Family Medicine

## 2017-06-20 ENCOUNTER — Ambulatory Visit: Payer: Medicare Other | Admitting: Family Medicine

## 2017-06-24 ENCOUNTER — Ambulatory Visit: Payer: Medicare Other | Admitting: Internal Medicine

## 2017-06-24 ENCOUNTER — Ambulatory Visit: Payer: Medicare Other | Admitting: Urology

## 2017-06-24 ENCOUNTER — Encounter: Payer: Self-pay | Admitting: Urology

## 2017-06-24 VITALS — BP 143/77 | HR 69 | Ht 69.0 in | Wt 246.0 lb

## 2017-06-24 DIAGNOSIS — R350 Frequency of micturition: Secondary | ICD-10-CM | POA: Diagnosis not present

## 2017-06-24 DIAGNOSIS — R3129 Other microscopic hematuria: Secondary | ICD-10-CM | POA: Diagnosis not present

## 2017-06-24 LAB — MICROSCOPIC EXAMINATION: WBC, UA: NONE SEEN /hpf (ref 0–5)

## 2017-06-24 LAB — URINALYSIS, COMPLETE
BILIRUBIN UA: NEGATIVE
GLUCOSE, UA: NEGATIVE
KETONES UA: NEGATIVE
LEUKOCYTES UA: NEGATIVE
Nitrite, UA: NEGATIVE
SPEC GRAV UA: 1.025 (ref 1.005–1.030)
Urobilinogen, Ur: 0.2 mg/dL (ref 0.2–1.0)
pH, UA: 5.5 (ref 5.0–7.5)

## 2017-06-24 LAB — BLADDER SCAN AMB NON-IMAGING: Scan Result: 20

## 2017-06-24 NOTE — Progress Notes (Signed)
06/24/2017 10:05 AM   [name] 31-Jul-1940 [id number]  Referring provider: Leone Haven, MD [address] [place], [place] [address]  Chief Complaint  Patient presents with  . New Patient (Initial Visit)    HPI: Dr Bernardo Heater [facility] 2016: The patient is a 77 y.o. male who presents today for follow-up of an elevated PSA. Prostate biopsy was performed in September 2015 for PSA of 7.5. Prostate volume was calculated at 120 mL and biopsies were benign with chronic inflammation. He had no bothersome voiding symptoms and declined any treatment for his enlarged prostate.  PSA may 2017: 5.45  PSA 07/1024: 6.58  Today The patient in the last 4 or 5 weeks has intermittent blood in the urine.  The gross hematuria is sometimes associated with mild burning and increased frequency.  A recent urine culture was negative.  He has had kidney stones in the past.  He has not had previous genitourinary surgery.  He has never smoked.  He takes daily aspirin but no other blood thinners  He voids every 2 or 3 hours with a reasonable flow and gets up twice a night to urinate  Modifying factors: There are no other modifying factors  Associated signs and symptoms: There are no other associated signs and symptoms Aggravating and relieving factors: There are no other aggravating or relieving factors Severity: Moderate Duration: Persistent         PMH: Past Medical History:  Diagnosis Date  . Allergy   . Anxiety   . Arthritis   . Cataract    beginning stage  . Elevated PSA   . GERD (gastroesophageal reflux disease)   . Gout   . Hyperlipidemia   . Hypertension     Surgical History: Past Surgical History:  Procedure Laterality Date  . BACK SURGERY     L4/5  . CARPAL TUNNEL RELEASE Right 08/15/2016   Procedure: CARPAL TUNNEL RELEASE ENDOSCOPIC;  Surgeon: Corky Mull, MD;  Location: [place];  Service: Orthopedics;  Laterality: Right;  . COLONOSCOPY    .  ESOPHAGOGASTRODUODENOSCOPY  11-21-06   Esophagitis, Stricture/dilated   . LYMPH NODE BIOPSY  1957   benign  lymph node removed  . Right arm fracture    . TONSILLECTOMY    . TOTAL KNEE ARTHROPLASTY  09-02-02   Bilateral  . UPPER GASTROINTESTINAL ENDOSCOPY      Home Medications:  Allergies as of 06/24/2017      Reactions   Penicillins Rash   Acetaminophen Itching   Aspirin Itching   OK w/low dose aspirin       Medication List        Accurate as of 06/24/17 10:05 AM. Always use your most recent med list.          acyclovir ointment 5 % Commonly known as:  ZOVIRAX Apply topically every 3 (three) hours. As needed for cold sores   amLODipine 5 MG tablet Commonly known as:  NORVASC TAKE 1 TABLET (5 MG TOTAL) BY MOUTH DAILY.   aspirin 81 MG tablet Take 81 mg by mouth daily.   ciprofloxacin 250 MG tablet Commonly known as:  CIPRO Take 1 tablet (250 mg total) by mouth 2 (two) times daily.   clindamycin 150 MG capsule Commonly known as:  CLEOCIN Take 150 mg by mouth 3 (three) times daily. Prior to dental work   colchicine 0.6 MG tablet Commonly known as:  COLCRYS Take 1 tablet (0.6 mg total) by mouth daily as needed (  When having a gout flare up).   CVS SPECTRAVITE SENIOR Tabs Take by mouth. 1 by mouth every am   fluocinonide cream 0.05 % Commonly known as:  LIDEX Apply topically daily.   meloxicam 15 MG tablet Commonly known as:  MOBIC TAKE 1 TABLET (15 MG TOTAL) BY MOUTH ONCE DAILY.   metoprolol succinate 50 MG 24 hr tablet Commonly known as:  TOPROL-XL TAKE 1 TABLET BY MOUTH DAILY. APPT NEEDED FOR FURTHER REFILLS. PLEASE CALL OFFICE SOON.   pantoprazole 40 MG tablet Commonly known as:  PROTONIX Take 1 tablet (40 mg total) by mouth 2 (two) times daily.   TART CHERRY ADVANCED Caps Take 425 capsules by mouth daily.   THERAGRAN-M FISH OIL CONC 1200 MG Caps Take by mouth daily.   traMADol 50 MG tablet Commonly known as:  ULTRAM Take 1 tablet (50 mg total)  by mouth every 6 (six) hours as needed.       Allergies:  Allergies  Allergen Reactions  . Penicillins Rash  . Acetaminophen Itching  . Aspirin Itching    OK w/low dose aspirin     Family History: Family History  Problem Relation Age of Onset  . Ulcers Father   . Diverticulitis Father   . Arthritis Father   . Hypertension Brother   . Obesity Brother   . Cancer Brother        throat  . Arthritis Mother   . Depression Mother        Anxiety  . Colon cancer Neg Hx   . Colon polyps Neg Hx   . Esophageal cancer Neg Hx   . Rectal cancer Neg Hx   . Stomach cancer Neg Hx     Social History:  reports that he has never smoked. He has never used smokeless tobacco. He reports that he does not drink alcohol or use drugs.  ROS:                                        Physical Exam: There were no vitals taken for this visit.  Constitutional:  Alert and oriented, No acute distress. HEENT: [place] AT, moist mucus membranes.  Trachea midline, no masses. Cardiovascular: No clubbing, cyanosis, or edema. Respiratory: Normal respiratory effort, no increased work of breathing. GI: Abdomen is soft, nontender, nondistended, no abdominal masses GU: No CVA tenderness.  Normal male genitalia and 60 g or greater benign prostate Skin: No rashes, bruises or suspicious lesions. Lymph: No cervical or inguinal adenopathy. Neurologic: Grossly intact, no focal deficits, moving all 4 extremities. Psychiatric: Normal mood and affect.  Laboratory Data: Lab Results  Component Value Date   WBC 8.2 07/20/2015   HGB 14.6 07/20/2015   HCT 43.1 07/20/2015   MCV 89.8 07/20/2015   PLT 211.0 07/20/2015    Lab Results  Component Value Date   CREATININE 1.29 06/06/2017    Lab Results  Component Value Date   PSA 5.45 (H) 07/20/2015   PSA 6.58 (H) 07/16/2014   PSA 7.50 (H) 06/30/2013    No results found for: TESTOSTERONE  Lab Results  Component Value Date   HGBA1C 5.6  06/26/2006    Urinalysis    Component Value Date/Time   COLORURINE YELLOW (A) 07/12/2014 0126   APPEARANCEUR HAZY (A) 07/12/2014 0126   APPEARANCEUR Clear 08/29/2013 2100   LABSPEC 1.018 07/12/2014 0126   LABSPEC 1.020 08/29/2013 2100   PHURINE  5.0 07/12/2014 0126   GLUCOSEU NEGATIVE 07/12/2014 0126   GLUCOSEU Negative 08/29/2013 2100   HGBUR 3+ (A) 07/12/2014 0126   BILIRUBINUR negative 06/06/2017 1431   BILIRUBINUR Negative 08/29/2013 2100   KETONESUR NEGATIVE 07/12/2014 0126   PROTEINUR 1+ 06/06/2017 1431   PROTEINUR 30 (A) 07/12/2014 0126   UROBILINOGEN 0.2 06/06/2017 1431   NITRITE negative 06/06/2017 1431   NITRITE NEGATIVE 07/12/2014 0126   LEUKOCYTESUR Negative 06/06/2017 1431   LEUKOCYTESUR Negative 08/29/2013 2100    Pertinent Imaging: none  Assessment & Plan: The patient and I spoke about the workup of gross hematuria.  Sometimes the urine color is dark red.  He has a history of stones.  He had no microscopic hematuria today.  I did not repeat the urine culture.  I will have him follow-up for cystoscopy and ongoing care with Dr. Bernardo Heater.  Recognizing his age I still thought it was reasonable with a past history to get a PSA with the BUN and creatinine.   1. Frequent urination 2.  Gross hematuria - Urinalysis, Complete   No follow-ups on file.  Reece Packer, MD  [facility] [address], [place], [place] [address] [phone]

## 2017-06-25 LAB — BUN+CREAT
BUN / CREAT RATIO: 11 (ref 10–24)
BUN: 13 mg/dL (ref 8–27)
CREATININE: 1.15 mg/dL (ref 0.76–1.27)
GFR, EST AFRICAN AMERICAN: 71 mL/min/{1.73_m2} (ref >59)
GFR, EST NON AFRICAN AMERICAN: 61 mL/min/{1.73_m2} (ref >59)

## 2017-06-25 LAB — PSA: Prostate Specific Ag, Serum: 6.1 ng/mL — ABNORMAL HIGH (ref 0.0–4.0)

## 2017-07-01 ENCOUNTER — Encounter: Payer: Self-pay | Admitting: Family Medicine

## 2017-07-11 ENCOUNTER — Ambulatory Visit
Admission: RE | Admit: 2017-07-11 | Discharge: 2017-07-11 | Disposition: A | Discharge status: Home / Self Care | Payer: Medicare Other | Source: Ambulatory Visit | Attending: Urology | Admitting: Urology

## 2017-07-11 DIAGNOSIS — N210 Calculus in bladder: Secondary | ICD-10-CM | POA: Insufficient documentation

## 2017-07-11 DIAGNOSIS — K760 Fatty (change of) liver, not elsewhere classified: Secondary | ICD-10-CM | POA: Diagnosis not present

## 2017-07-11 DIAGNOSIS — R93422 Abnormal radiologic findings on diagnostic imaging of left kidney: Secondary | ICD-10-CM | POA: Diagnosis not present

## 2017-07-11 DIAGNOSIS — N200 Calculus of kidney: Secondary | ICD-10-CM | POA: Diagnosis not present

## 2017-07-11 DIAGNOSIS — K4020 Bilateral inguinal hernia, without obstruction or gangrene, not specified as recurrent: Secondary | ICD-10-CM | POA: Insufficient documentation

## 2017-07-11 DIAGNOSIS — R93421 Abnormal radiologic findings on diagnostic imaging of right kidney: Secondary | ICD-10-CM | POA: Diagnosis not present

## 2017-07-11 DIAGNOSIS — N400 Enlarged prostate without lower urinary tract symptoms: Secondary | ICD-10-CM | POA: Diagnosis not present

## 2017-07-11 DIAGNOSIS — K5730 Diverticulosis of large intestine without perforation or abscess without bleeding: Secondary | ICD-10-CM | POA: Diagnosis not present

## 2017-07-11 DIAGNOSIS — R3129 Other microscopic hematuria: Secondary | ICD-10-CM

## 2017-07-11 MED ORDER — IOPAMIDOL (ISOVUE-300) INJECTION 61%
125.0000 mL | Freq: Once | INTRAVENOUS | Status: AC | PRN
Start: 2017-07-11 — End: 2017-07-11
  Administered 2017-07-11: 125 mL via INTRAVENOUS

## 2017-07-17 ENCOUNTER — Other Ambulatory Visit: Payer: Self-pay | Admitting: Family Medicine

## 2017-07-24 ENCOUNTER — Other Ambulatory Visit: Payer: Medicare Other | Admitting: Urology

## 2017-08-06 ENCOUNTER — Encounter: Payer: Self-pay | Admitting: Urology

## 2017-08-06 ENCOUNTER — Ambulatory Visit: Payer: Medicare Other | Admitting: Urology

## 2017-08-06 VITALS — BP 135/77 | HR 67 | Ht 69.0 in | Wt 243.1 lb

## 2017-08-06 DIAGNOSIS — R310 Gross hematuria: Secondary | ICD-10-CM

## 2017-08-06 DIAGNOSIS — N210 Calculus in bladder: Secondary | ICD-10-CM

## 2017-08-06 DIAGNOSIS — N401 Enlarged prostate with lower urinary tract symptoms: Secondary | ICD-10-CM

## 2017-08-06 DIAGNOSIS — N138 Other obstructive and reflux uropathy: Secondary | ICD-10-CM

## 2017-08-06 LAB — MICROSCOPIC EXAMINATION: WBC, UA: NONE SEEN /hpf (ref 0–5)

## 2017-08-06 LAB — URINALYSIS, COMPLETE
Bilirubin, UA: NEGATIVE
GLUCOSE, UA: NEGATIVE
KETONES UA: NEGATIVE
LEUKOCYTES UA: NEGATIVE
Nitrite, UA: NEGATIVE
Protein, UA: NEGATIVE
SPEC GRAV UA: 1.02 (ref 1.005–1.030)
Urobilinogen, Ur: 0.2 mg/dL (ref 0.2–1.0)
pH, UA: 5 (ref 5.0–7.5)

## 2017-08-06 MED ORDER — LIDOCAINE HCL URETHRAL/MUCOSAL 2 % EX GEL
1.0000 "application " | Freq: Once | CUTANEOUS | Status: AC
  Administered 2017-08-06: 1 via URETHRAL

## 2017-08-06 MED ORDER — CIPROFLOXACIN HCL 500 MG PO TABS
500.0000 mg | ORAL_TABLET | Freq: Once | ORAL | Status: AC
  Administered 2017-08-06: 500 mg via ORAL

## 2017-08-06 NOTE — Progress Notes (Signed)
   08/06/17  CC:  Chief Complaint  Patient presents with  . Cysto    HPI: 77 year old male who recently saw Dr. Matilde Sprang for intermittent gross hematuria.  He denies recurrent hematuria.  He has a history of benign prostate biopsy in 2015 with a prostate volume calculated at 120 cc. CTU performed 07/11/2017 shows 2 small nonobstructing left renal calculi.  Multiple bladder stones are present with the largest measuring approximately 11 mm.  The prostate is significantly enlarged.  No intravesical median lobe seen on sagittal and coronal views.  Blood pressure 135/77, pulse 67, height 5\' 9"  (1.753 m), weight 243 lb 1.6 oz (110.3 kg), SpO2 99 %.   Cystoscopy Procedure Note  Patient identification was confirmed, informed consent was obtained, and patient was prepped using Betadine solution.  Lidocaine jelly was administered per urethral meatus.    Preoperative abx where received prior to procedure.     Pre-Procedure: - Inspection reveals a normal caliber ureteral meatus.  Procedure: The flexible cystoscope was introduced without difficulty - No urethral strictures/lesions are present. - Coapting lateral lobes prostate; prostatic urethral length estimated at 7 cm; hypervascularity with friable mucosa - Moderate bladder neck elevation bladder neck - Bilateral ureteral orifices not identified secondary to back bleeding from prostate - Bladder mucosa  reveals no obvious ulcers, tumors, or lesions - Multiple bladder calculi bladder stones - Moderate trabeculation  Retroflexion shows no significant intravesical median lobe however vision was somewhat obscured due to back bleeding from the prostate.   Post-Procedure: - Patient tolerated the procedure well  Assessment/ Plan: Marked prostate enlargement with multiple bladder calculi.  We discussed without treatment his bladder calculi would most likely enlarge and treatment for his significant BPH would also be recommended.  Will have  him see Dr. Erlene Quan to discuss HOLEP.

## 2017-08-08 ENCOUNTER — Other Ambulatory Visit: Payer: Self-pay | Admitting: Family Medicine

## 2017-08-26 ENCOUNTER — Other Ambulatory Visit: Payer: Self-pay | Admitting: Family Medicine

## 2017-09-28 ENCOUNTER — Other Ambulatory Visit: Payer: Self-pay | Admitting: Family Medicine

## 2017-10-01 ENCOUNTER — Ambulatory Visit: Payer: Medicare Other | Admitting: Urology

## 2017-10-01 ENCOUNTER — Encounter: Payer: Self-pay | Admitting: Urology

## 2017-10-01 VITALS — BP 144/76 | HR 73 | Ht 69.0 in | Wt 241.0 lb

## 2017-10-01 DIAGNOSIS — N210 Calculus in bladder: Secondary | ICD-10-CM

## 2017-10-01 DIAGNOSIS — N138 Other obstructive and reflux uropathy: Secondary | ICD-10-CM

## 2017-10-01 DIAGNOSIS — N401 Enlarged prostate with lower urinary tract symptoms: Secondary | ICD-10-CM

## 2017-10-01 LAB — URINALYSIS, COMPLETE
Bilirubin, UA: NEGATIVE
GLUCOSE, UA: NEGATIVE
Leukocytes, UA: NEGATIVE
Nitrite, UA: NEGATIVE
PROTEIN UA: NEGATIVE
SPEC GRAV UA: 1.02 (ref 1.005–1.030)
Urobilinogen, Ur: 0.2 mg/dL (ref 0.2–1.0)
pH, UA: 5 (ref 5.0–7.5)

## 2017-10-01 LAB — MICROSCOPIC EXAMINATION: Bacteria, UA: NONE SEEN

## 2017-10-01 LAB — BLADDER SCAN AMB NON-IMAGING

## 2017-10-01 NOTE — H&P (View-Only) (Signed)
10/01/2017 11:04 AM   [name] Jan 12, 1941 [id number]  Referring provider: Leone Haven, MD [address] [place], [place] [address]  Chief Complaint  Patient presents with  . Benign Prostatic Hypertrophy    discuss surgery    HPI: 77 year old male with history of massive prostamegaly with bladder outlet obstruction who presents today to discuss an outlet procedure.  He was previously followed by Dr. Bernardo Heater.  He has a history of 120 g prostate along with multiple bladder stones up to 11 mm.  Most recently, he underwent cystoscopy revealing coapting lateral lobes with friable mucosa.  The bladder stones were identified.  There was moderate trabeculation of the bladder appreciated.  There is no significant median lobe.  He does have baseline urinary symptoms.    He also has a personal history of elevated PSA status post negative biopsy in 2017.  His PSA remains stably elevated, most recently 6.03-2017.  IPSS    Row Name 10/01/17 1000         International Prostate Symptom Score   How often have you had the sensation of not emptying your bladder?  Not at All     How often have you had to urinate less than every two hours?  Less than 1 in 5 times     How often have you found you stopped and started again several times when you urinated?  Less than half the time     How often have you found it difficult to postpone urination?  Not at All     How often have you had a weak urinary stream?  Less than 1 in 5 times     How often have you had to strain to start urination?  Not at All     How many times did you typically get up at night to urinate?  1 Time     Total IPSS Score  5       Quality of Life due to urinary symptoms   If you were to spend the rest of your life with your urinary condition just the way it is now how would you feel about that?  Mostly Satisfied        Score:  1-7 Mild 8-19 Moderate 20-35 Severe   PMH: Past Medical History:    Diagnosis Date  . Allergy   . Anxiety   . Arthritis   . Cataract    beginning stage bilateral  . Elevated PSA   . GERD (gastroesophageal reflux disease)   . Gout   . History of kidney stones   . Hyperlipidemia   . Hypertension   . Inguinal hernia     Surgical History: Past Surgical History:  Procedure Laterality Date  . BACK SURGERY  1980's   L4/5  . CARPAL TUNNEL RELEASE Right 08/15/2016   Procedure: CARPAL TUNNEL RELEASE ENDOSCOPIC;  Surgeon: Corky Mull, MD;  Location: [facility];  Service: Orthopedics;  Laterality: Right;  . COLONOSCOPY    . ESOPHAGOGASTRODUODENOSCOPY  11-21-06   Esophagitis, Stricture/dilated   . LYMPH NODE BIOPSY  1957   benign  lymph node removed  . Right arm fracture    . TONSILLECTOMY    . TOTAL KNEE ARTHROPLASTY  09-02-02   Bilateral  . UPPER GASTROINTESTINAL ENDOSCOPY      Home Medications:  Allergies as of 10/01/2017      Reactions   Penicillins Rash   Acetaminophen Itching   Aspirin Itching   OK  w/low dose aspirin       Medication List        Accurate as of 10/01/17 11:59 PM. Always use your most recent med list.          acyclovir ointment 5 % Commonly known as:  ZOVIRAX Apply topically every 3 (three) hours. As needed for cold sores   amLODipine 5 MG tablet Commonly known as:  NORVASC TAKE 1 TABLET (5 MG TOTAL) BY MOUTH DAILY.   aspirin 81 MG tablet Take 81 mg by mouth daily.   colchicine 0.6 MG tablet Commonly known as:  COLCRYS Take 1 tablet (0.6 mg total) by mouth daily as needed (When having a gout flare up).   [employer] SPECTRAVITE SENIOR Tabs Take by mouth. 1 by mouth every am   fluocinonide cream 0.05 % Commonly known as:  LIDEX Apply topically daily.   meloxicam 15 MG tablet Commonly known as:  MOBIC TAKE 1 TABLET (15 MG TOTAL) BY MOUTH ONCE DAILY.   metoprolol succinate 50 MG 24 hr tablet Commonly known as:  TOPROL-XL TAKE 1 TABLET BY MOUTH DAILY. APPT NEEDED FOR FURTHER REFILLS. PLEASE CALL  OFFICE SOON.   pantoprazole 40 MG tablet Commonly known as:  PROTONIX TAKE 1 TABLET BY MOUTH TWICE A DAY   TART CHERRY ADVANCED Caps Take 425 capsules by mouth daily.   THERAGRAN-M FISH OIL CONC 1200 MG Caps Take by mouth daily.   traMADol 50 MG tablet Commonly known as:  ULTRAM Take 1 tablet (50 mg total) by mouth every 6 (six) hours as needed.       Allergies:  Allergies  Allergen Reactions  . Penicillins Rash  . Acetaminophen Itching  . Aspirin Itching    OK w/low dose aspirin     Family History: Family History  Problem Relation Age of Onset  . Ulcers Father   . Diverticulitis Father   . Arthritis Father   . Hypertension Brother   . Obesity Brother   . Cancer Brother        throat  . Arthritis Mother   . Depression Mother        Anxiety  . Colon cancer Neg Hx   . Colon polyps Neg Hx   . Esophageal cancer Neg Hx   . Rectal cancer Neg Hx   . Stomach cancer Neg Hx   . Kidney disease Neg Hx   . Kidney cancer Neg Hx   . Prostate cancer Neg Hx     Social History:  reports that he has never smoked. He has never used smokeless tobacco. He reports that he does not drink alcohol or use drugs.  ROS: UROLOGY Frequent Urination?: No Hard to postpone urination?: No Burning/pain with urination?: No Get up at night to urinate?: No Leakage of urine?: No Urine stream starts and stops?: No Trouble starting stream?: No Do you have to strain to urinate?: No Blood in urine?: Yes Urinary tract infection?: No Sexually transmitted disease?: No Injury to kidneys or bladder?: No Painful intercourse?: No Weak stream?: No Erection problems?: No Penile pain?: No  Gastrointestinal Nausea?: No Vomiting?: No Indigestion/heartburn?: No Diarrhea?: No Constipation?: No  Constitutional Fever: No Night sweats?: No Weight loss?: No Fatigue?: No  Skin Skin rash/lesions?: No Itching?: No  Eyes Blurred vision?: No Double vision?: No  Ears/Nose/Throat Sore  throat?: No Sinus problems?: No  Hematologic/Lymphatic Swollen glands?: No Easy bruising?: No  Cardiovascular Leg swelling?: No Chest pain?: No  Respiratory Cough?: No Shortness of breath?: No  Endocrine  Excessive thirst?: No  Musculoskeletal Back pain?: No Joint pain?: No  Neurological Dizziness?: No  Psychologic Depression?: No Anxiety?: No  Physical Exam: BP (!) 144/76   Pulse 73   Ht 5\' 9"  (1.753 m)   Wt 241 lb (109.3 kg)   BMI 35.59 kg/m   Constitutional:  Alert and oriented, No acute distress. HEENT: [place] AT, moist mucus membranes.  Trachea midline, no masses. Cardiovascular: No clubbing, cyanosis, or edema.  RRR.   Respiratory: Normal respiratory effort, no increased work of breathing. CTAB. GI: Abdomen is soft, nontender, nondistended, no abdominal masses GU: No CVA tenderness.   Skin: No rashes, bruises or suspicious lesions. Neurologic: Grossly intact, no focal deficits, moving all 4 extremities. Psychiatric: Normal mood and affect.  Laboratory Data: Lab Results  Component Value Date   WBC 7.4 10/04/2017   HGB 14.4 10/04/2017   HCT 41.2 10/04/2017   MCV 88.5 10/04/2017   PLT 181 10/04/2017    Lab Results  Component Value Date   CREATININE 1.33 (H) 10/04/2017    Lab Results  Component Value Date   PSA 5.45 (H) 07/20/2015   PSA 6.58 (H) 07/16/2014   PSA 7.50 (H) 06/30/2013    Lab Results  Component Value Date   HGBA1C 5.6 06/26/2006    Urinalysis    Component Value Date/Time   COLORURINE YELLOW (A) 07/12/2014 0126   APPEARANCEUR Clear 10/01/2017 1018   LABSPEC 1.018 07/12/2014 0126   LABSPEC 1.020 08/29/2013 2100   PHURINE 5.0 07/12/2014 0126   GLUCOSEU Negative 10/01/2017 1018   GLUCOSEU Negative 08/29/2013 2100   HGBUR 3+ (A) 07/12/2014 0126   BILIRUBINUR Negative 10/01/2017 1018   BILIRUBINUR Negative 08/29/2013 2100   KETONESUR NEGATIVE 07/12/2014 0126   PROTEINUR Negative 10/01/2017 1018   PROTEINUR 30 (A) 07/12/2014  0126   UROBILINOGEN 0.2 06/06/2017 1431   NITRITE Negative 10/01/2017 1018   NITRITE NEGATIVE 07/12/2014 0126   LEUKOCYTESUR Negative 10/01/2017 1018   LEUKOCYTESUR Negative 08/29/2013 2100    Lab Results  Component Value Date   LABMICR See below: 10/01/2017   WBCUA 0-5 10/01/2017   RBCUA 0-2 10/01/2017   LABEPIT 0-10 10/01/2017   MUCUS Present (A) 10/01/2017   BACTERIA None seen 10/01/2017    Pertinent Imaging: Results for orders placed during the hospital encounter of 07/11/17  CT HEMATURIA WORKUP   Narrative CLINICAL DATA:  Patient with history of gross hematuria 2 months prior. Recent past stones.  EXAM: CT ABDOMEN AND PELVIS WITHOUT AND WITH CONTRAST  TECHNIQUE: Multidetector CT imaging of the abdomen and pelvis was performed following the standard protocol before and following the bolus administration of intravenous contrast.  CONTRAST:  164mL ISOVUE-300 IOPAMIDOL (ISOVUE-300) INJECTION 61%  COMPARISON:  CT abdomen pelvis 07/12/2014  FINDINGS: Lower chest: Normal heart size. Lung bases are clear. Normal heart size.  Hepatobiliary: Liver is diffusely low in attenuation compatible with steatosis. Stable subcentimeter low-attenuation lesion adjacent to the falciform ligament, too small to characterize. Gallbladder is unremarkable. No intrahepatic or extrahepatic biliary ductal dilatation.  Pancreas: Unremarkable  Spleen: Unremarkable.  Adrenals/Urinary Tract: Normal adrenal glands. Kidneys enhance symmetrically with contrast. Mild heterogeneity of the renal enhancement. No hydronephrosis. Noncontrast images demonstrate a 2 mm stone inferior pole left kidney (image 52; series 7). 2 mm stone superior pole left kidney (image 66; series 7). No suspicious enhancing renal masses identified. Delayed images demonstrate excretion of contrast material the bilateral renal collecting systems and ureters. No filling defect identified.  The urinary bladder wall is  thickened. Multiple stones are demonstrated within the urinary bladder lumen. These are visualized as filling defects on delayed images in the prone position. Mild fat stranding about the urinary bladder.  Stomach/Bowel: Sigmoid colonic diverticulosis. No CT evidence for acute diverticulitis. No evidence for small bowel obstruction. No free fluid or free intraperitoneal air. Normal morphology of the stomach.  Vascular/Lymphatic: Normal caliber abdominal aorta. Peripheral calcified atherosclerotic plaque. No retroperitoneal lymphadenopathy.  Reproductive: Prostate is markedly enlarged.  Other: Bilateral fat containing inguinal hernias.  Musculoskeletal: Lumbar spine degenerative changes. No aggressive or acute appearing osseous lesions.  IMPRESSION: 1. Multiple stones demonstrated within the bladder lumen. There is wall thickening of the urinary bladder which may be secondary to outlet obstruction. Superimposed infection not excluded given surrounding fat stranding. 2. There is mild heterogeneity of the renal enhancement bilaterally which is nonspecific in etiology. The possibility of pyelonephritis is not excluded. Recommend correlation with urinalysis. 3. Tiny stones left kidney. 4. Markedly enlarged prostate. 5. Bilateral fat containing inguinal hernias. 6. Sigmoid colonic diverticulosis without evidence for acute diverticulitis. 7. Hepatic steatosis. 8. These results will be called to the ordering clinician or representative by the Radiologist Assistant, and communication documented in the PACS or zVision Dashboard.   Electronically Signed   By: Lovey Newcomer M.D.   On: 07/11/2017 15:46    Reviewed imaging, agree with evidence of prostamegaly and bladder stones.  Assessment & Plan:    1. BPH with obstruction/lower urinary tract symptoms 77 year old male with symptomatic prostamegaly and bladder stones We discussed holmium laser enucleation of the prostate including  the preop, Intra-Op and postoperative details.  Specifically related to This procedure, we discussed the risk of bleeding, infection, demonstrating structures including damage to the bladder, risk of prostatic regrowth or need for recurrent procedures, risk of increased voiding symptoms primarily irritative in nature, stress incontinence amongst others.  All questions were answered.  He would like to proceed as planned. - Urinalysis, Complete - BLADDER SCAN AMB NON-IMAGING - CULTURE, URINE COMPREHENSIVE  2. Bladder calculi We will plan to treat his bladder stones the time of surgery.  Hollice Espy, MD  [facility] [address] [place], [place] [address] [phone]  I spent 25 min with this patient of which greater than 50% was spent in counseling and coordination of care with the patient.

## 2017-10-01 NOTE — Progress Notes (Signed)
10/01/2017 11:04 AM   [name] 05/22/1940 [id number]  Referring provider: Leone Haven, MD [address] [place], [place] [address]  Chief Complaint  Patient presents with  . Benign Prostatic Hypertrophy    discuss surgery    HPI: 77 year old male with history of massive prostamegaly with bladder outlet obstruction who presents today to discuss an outlet procedure.  He was previously followed by Dr. Bernardo Heater.  He has a history of 120 g prostate along with multiple bladder stones up to 11 mm.  Most recently, he underwent cystoscopy revealing coapting lateral lobes with friable mucosa.  The bladder stones were identified.  There was moderate trabeculation of the bladder appreciated.  There is no significant median lobe.  He does have baseline urinary symptoms.    He also has a personal history of elevated PSA status post negative biopsy in 2017.  His PSA remains stably elevated, most recently 6.03-2017.  IPSS    Row Name 10/01/17 1000         International Prostate Symptom Score   How often have you had the sensation of not emptying your bladder?  Not at All     How often have you had to urinate less than every two hours?  Less than 1 in 5 times     How often have you found you stopped and started again several times when you urinated?  Less than half the time     How often have you found it difficult to postpone urination?  Not at All     How often have you had a weak urinary stream?  Less than 1 in 5 times     How often have you had to strain to start urination?  Not at All     How many times did you typically get up at night to urinate?  1 Time     Total IPSS Score  5       Quality of Life due to urinary symptoms   If you were to spend the rest of your life with your urinary condition just the way it is now how would you feel about that?  Mostly Satisfied        Score:  1-7 Mild 8-19 Moderate 20-35 Severe   PMH: Past Medical History:    Diagnosis Date  . Allergy   . Anxiety   . Arthritis   . Cataract    beginning stage bilateral  . Elevated PSA   . GERD (gastroesophageal reflux disease)   . Gout   . History of kidney stones   . Hyperlipidemia   . Hypertension   . Inguinal hernia     Surgical History: Past Surgical History:  Procedure Laterality Date  . BACK SURGERY  1980's   L4/5  . CARPAL TUNNEL RELEASE Right 08/15/2016   Procedure: CARPAL TUNNEL RELEASE ENDOSCOPIC;  Surgeon: Corky Mull, MD;  Location: [facility];  Service: Orthopedics;  Laterality: Right;  . COLONOSCOPY    . ESOPHAGOGASTRODUODENOSCOPY  11-21-06   Esophagitis, Stricture/dilated   . LYMPH NODE BIOPSY  1957   benign  lymph node removed  . Right arm fracture    . TONSILLECTOMY    . TOTAL KNEE ARTHROPLASTY  09-02-02   Bilateral  . UPPER GASTROINTESTINAL ENDOSCOPY      Home Medications:  Allergies as of 10/01/2017      Reactions   Penicillins Rash   Acetaminophen Itching   Aspirin Itching   OK  w/low dose aspirin       Medication List        Accurate as of 10/01/17 11:59 PM. Always use your most recent med list.          acyclovir ointment 5 % Commonly known as:  ZOVIRAX Apply topically every 3 (three) hours. As needed for cold sores   amLODipine 5 MG tablet Commonly known as:  NORVASC TAKE 1 TABLET (5 MG TOTAL) BY MOUTH DAILY.   aspirin 81 MG tablet Take 81 mg by mouth daily.   colchicine 0.6 MG tablet Commonly known as:  COLCRYS Take 1 tablet (0.6 mg total) by mouth daily as needed (When having a gout flare up).   [employer] SPECTRAVITE SENIOR Tabs Take by mouth. 1 by mouth every am   fluocinonide cream 0.05 % Commonly known as:  LIDEX Apply topically daily.   meloxicam 15 MG tablet Commonly known as:  MOBIC TAKE 1 TABLET (15 MG TOTAL) BY MOUTH ONCE DAILY.   metoprolol succinate 50 MG 24 hr tablet Commonly known as:  TOPROL-XL TAKE 1 TABLET BY MOUTH DAILY. APPT NEEDED FOR FURTHER REFILLS. PLEASE CALL  OFFICE SOON.   pantoprazole 40 MG tablet Commonly known as:  PROTONIX TAKE 1 TABLET BY MOUTH TWICE A DAY   TART CHERRY ADVANCED Caps Take 425 capsules by mouth daily.   THERAGRAN-M FISH OIL CONC 1200 MG Caps Take by mouth daily.   traMADol 50 MG tablet Commonly known as:  ULTRAM Take 1 tablet (50 mg total) by mouth every 6 (six) hours as needed.       Allergies:  Allergies  Allergen Reactions  . Penicillins Rash  . Acetaminophen Itching  . Aspirin Itching    OK w/low dose aspirin     Family History: Family History  Problem Relation Age of Onset  . Ulcers Father   . Diverticulitis Father   . Arthritis Father   . Hypertension Brother   . Obesity Brother   . Cancer Brother        throat  . Arthritis Mother   . Depression Mother        Anxiety  . Colon cancer Neg Hx   . Colon polyps Neg Hx   . Esophageal cancer Neg Hx   . Rectal cancer Neg Hx   . Stomach cancer Neg Hx   . Kidney disease Neg Hx   . Kidney cancer Neg Hx   . Prostate cancer Neg Hx     Social History:  reports that he has never smoked. He has never used smokeless tobacco. He reports that he does not drink alcohol or use drugs.  ROS: UROLOGY Frequent Urination?: No Hard to postpone urination?: No Burning/pain with urination?: No Get up at night to urinate?: No Leakage of urine?: No Urine stream starts and stops?: No Trouble starting stream?: No Do you have to strain to urinate?: No Blood in urine?: Yes Urinary tract infection?: No Sexually transmitted disease?: No Injury to kidneys or bladder?: No Painful intercourse?: No Weak stream?: No Erection problems?: No Penile pain?: No  Gastrointestinal Nausea?: No Vomiting?: No Indigestion/heartburn?: No Diarrhea?: No Constipation?: No  Constitutional Fever: No Night sweats?: No Weight loss?: No Fatigue?: No  Skin Skin rash/lesions?: No Itching?: No  Eyes Blurred vision?: No Double vision?: No  Ears/Nose/Throat Sore  throat?: No Sinus problems?: No  Hematologic/Lymphatic Swollen glands?: No Easy bruising?: No  Cardiovascular Leg swelling?: No Chest pain?: No  Respiratory Cough?: No Shortness of breath?: No  Endocrine  Excessive thirst?: No  Musculoskeletal Back pain?: No Joint pain?: No  Neurological Dizziness?: No  Psychologic Depression?: No Anxiety?: No  Physical Exam: BP (!) 144/76   Pulse 73   Ht 5\' 9"  (1.753 m)   Wt 241 lb (109.3 kg)   BMI 35.59 kg/m   Constitutional:  Alert and oriented, No acute distress. HEENT: [place] AT, moist mucus membranes.  Trachea midline, no masses. Cardiovascular: No clubbing, cyanosis, or edema.  RRR.   Respiratory: Normal respiratory effort, no increased work of breathing. CTAB. GI: Abdomen is soft, nontender, nondistended, no abdominal masses GU: No CVA tenderness.   Skin: No rashes, bruises or suspicious lesions. Neurologic: Grossly intact, no focal deficits, moving all 4 extremities. Psychiatric: Normal mood and affect.  Laboratory Data: Lab Results  Component Value Date   WBC 7.4 10/04/2017   HGB 14.4 10/04/2017   HCT 41.2 10/04/2017   MCV 88.5 10/04/2017   PLT 181 10/04/2017    Lab Results  Component Value Date   CREATININE 1.33 (H) 10/04/2017    Lab Results  Component Value Date   PSA 5.45 (H) 07/20/2015   PSA 6.58 (H) 07/16/2014   PSA 7.50 (H) 06/30/2013    Lab Results  Component Value Date   HGBA1C 5.6 06/26/2006    Urinalysis    Component Value Date/Time   COLORURINE YELLOW (A) 07/12/2014 0126   APPEARANCEUR Clear 10/01/2017 1018   LABSPEC 1.018 07/12/2014 0126   LABSPEC 1.020 08/29/2013 2100   PHURINE 5.0 07/12/2014 0126   GLUCOSEU Negative 10/01/2017 1018   GLUCOSEU Negative 08/29/2013 2100   HGBUR 3+ (A) 07/12/2014 0126   BILIRUBINUR Negative 10/01/2017 1018   BILIRUBINUR Negative 08/29/2013 2100   KETONESUR NEGATIVE 07/12/2014 0126   PROTEINUR Negative 10/01/2017 1018   PROTEINUR 30 (A) 07/12/2014  0126   UROBILINOGEN 0.2 06/06/2017 1431   NITRITE Negative 10/01/2017 1018   NITRITE NEGATIVE 07/12/2014 0126   LEUKOCYTESUR Negative 10/01/2017 1018   LEUKOCYTESUR Negative 08/29/2013 2100    Lab Results  Component Value Date   LABMICR See below: 10/01/2017   WBCUA 0-5 10/01/2017   RBCUA 0-2 10/01/2017   LABEPIT 0-10 10/01/2017   MUCUS Present (A) 10/01/2017   BACTERIA None seen 10/01/2017    Pertinent Imaging: Results for orders placed during the hospital encounter of 07/11/17  CT HEMATURIA WORKUP   Narrative CLINICAL DATA:  Patient with history of gross hematuria 2 months prior. Recent past stones.  EXAM: CT ABDOMEN AND PELVIS WITHOUT AND WITH CONTRAST  TECHNIQUE: Multidetector CT imaging of the abdomen and pelvis was performed following the standard protocol before and following the bolus administration of intravenous contrast.  CONTRAST:  157mL ISOVUE-300 IOPAMIDOL (ISOVUE-300) INJECTION 61%  COMPARISON:  CT abdomen pelvis 07/12/2014  FINDINGS: Lower chest: Normal heart size. Lung bases are clear. Normal heart size.  Hepatobiliary: Liver is diffusely low in attenuation compatible with steatosis. Stable subcentimeter low-attenuation lesion adjacent to the falciform ligament, too small to characterize. Gallbladder is unremarkable. No intrahepatic or extrahepatic biliary ductal dilatation.  Pancreas: Unremarkable  Spleen: Unremarkable.  Adrenals/Urinary Tract: Normal adrenal glands. Kidneys enhance symmetrically with contrast. Mild heterogeneity of the renal enhancement. No hydronephrosis. Noncontrast images demonstrate a 2 mm stone inferior pole left kidney (image 52; series 7). 2 mm stone superior pole left kidney (image 66; series 7). No suspicious enhancing renal masses identified. Delayed images demonstrate excretion of contrast material the bilateral renal collecting systems and ureters. No filling defect identified.  The urinary bladder wall is  thickened. Multiple stones are demonstrated within the urinary bladder lumen. These are visualized as filling defects on delayed images in the prone position. Mild fat stranding about the urinary bladder.  Stomach/Bowel: Sigmoid colonic diverticulosis. No CT evidence for acute diverticulitis. No evidence for small bowel obstruction. No free fluid or free intraperitoneal air. Normal morphology of the stomach.  Vascular/Lymphatic: Normal caliber abdominal aorta. Peripheral calcified atherosclerotic plaque. No retroperitoneal lymphadenopathy.  Reproductive: Prostate is markedly enlarged.  Other: Bilateral fat containing inguinal hernias.  Musculoskeletal: Lumbar spine degenerative changes. No aggressive or acute appearing osseous lesions.  IMPRESSION: 1. Multiple stones demonstrated within the bladder lumen. There is wall thickening of the urinary bladder which may be secondary to outlet obstruction. Superimposed infection not excluded given surrounding fat stranding. 2. There is mild heterogeneity of the renal enhancement bilaterally which is nonspecific in etiology. The possibility of pyelonephritis is not excluded. Recommend correlation with urinalysis. 3. Tiny stones left kidney. 4. Markedly enlarged prostate. 5. Bilateral fat containing inguinal hernias. 6. Sigmoid colonic diverticulosis without evidence for acute diverticulitis. 7. Hepatic steatosis. 8. These results will be called to the ordering clinician or representative by the Radiologist Assistant, and communication documented in the PACS or zVision Dashboard.   Electronically Signed   By: Lovey Newcomer M.D.   On: 07/11/2017 15:46    Reviewed imaging, agree with evidence of prostamegaly and bladder stones.  Assessment & Plan:    1. BPH with obstruction/lower urinary tract symptoms 77 year old male with symptomatic prostamegaly and bladder stones We discussed holmium laser enucleation of the prostate including  the preop, Intra-Op and postoperative details.  Specifically related to This procedure, we discussed the risk of bleeding, infection, demonstrating structures including damage to the bladder, risk of prostatic regrowth or need for recurrent procedures, risk of increased voiding symptoms primarily irritative in nature, stress incontinence amongst others.  All questions were answered.  He would like to proceed as planned. - Urinalysis, Complete - BLADDER SCAN AMB NON-IMAGING - CULTURE, URINE COMPREHENSIVE  2. Bladder calculi We will plan to treat his bladder stones the time of surgery.  Hollice Espy, MD  [facility] [address] [place], [place] [address] [phone]  I spent 25 min with this patient of which greater than 50% was spent in counseling and coordination of care with the patient.

## 2017-10-02 ENCOUNTER — Other Ambulatory Visit: Payer: Self-pay | Admitting: Radiology

## 2017-10-02 DIAGNOSIS — N401 Enlarged prostate with lower urinary tract symptoms: Principal | ICD-10-CM

## 2017-10-02 DIAGNOSIS — N210 Calculus in bladder: Secondary | ICD-10-CM

## 2017-10-02 DIAGNOSIS — N138 Other obstructive and reflux uropathy: Secondary | ICD-10-CM

## 2017-10-04 ENCOUNTER — Encounter
Admission: RE | Admit: 2017-10-04 | Discharge: 2017-10-04 | Disposition: A | Discharge status: Home / Self Care | Payer: Medicare Other | Source: Ambulatory Visit | Attending: Urology | Admitting: Urology

## 2017-10-04 ENCOUNTER — Other Ambulatory Visit: Payer: Self-pay

## 2017-10-04 DIAGNOSIS — I10 Essential (primary) hypertension: Secondary | ICD-10-CM | POA: Insufficient documentation

## 2017-10-04 DIAGNOSIS — Z01812 Encounter for preprocedural laboratory examination: Secondary | ICD-10-CM | POA: Insufficient documentation

## 2017-10-04 DIAGNOSIS — R001 Bradycardia, unspecified: Secondary | ICD-10-CM | POA: Diagnosis not present

## 2017-10-04 HISTORY — DX: Unilateral inguinal hernia, without obstruction or gangrene, not specified as recurrent: K40.90

## 2017-10-04 HISTORY — DX: Personal history of urinary calculi: Z87.442

## 2017-10-04 LAB — CBC
HEMATOCRIT: 41.2 % (ref 40.0–52.0)
HEMOGLOBIN: 14.4 g/dL (ref 13.0–18.0)
MCH: 31 pg (ref 26.0–34.0)
MCHC: 35 g/dL (ref 32.0–36.0)
MCV: 88.5 fL (ref 80.0–100.0)
Platelets: 181 10*3/uL (ref 150–440)
RBC: 4.65 MIL/uL (ref 4.40–5.90)
RDW: 13.8 % (ref 11.5–14.5)
WBC: 7.4 10*3/uL (ref 3.8–10.6)

## 2017-10-04 LAB — BASIC METABOLIC PANEL
ANION GAP: 8 (ref 5–15)
BUN: 20 mg/dL (ref 8–23)
CO2: 28 mmol/L (ref 22–32)
Calcium: 9 mg/dL (ref 8.9–10.3)
Chloride: 105 mmol/L (ref 98–111)
Creatinine, Ser: 1.33 mg/dL — ABNORMAL HIGH (ref 0.61–1.24)
GFR calc Af Amer: 57 mL/min — ABNORMAL LOW (ref >60)
GFR, EST NON AFRICAN AMERICAN: 50 mL/min — AB (ref >60)
Glucose, Bld: 91 mg/dL (ref 70–99)
POTASSIUM: 3.8 mmol/L (ref 3.5–5.1)
SODIUM: 141 mmol/L (ref 135–145)

## 2017-10-04 LAB — CULTURE, URINE COMPREHENSIVE

## 2017-10-04 LAB — PROTIME-INR
INR: 1.03
Prothrombin Time: 13.4 seconds (ref 11.4–15.2)

## 2017-10-04 MED ORDER — FAMOTIDINE 20 MG PO TABS
20.0000 mg | ORAL_TABLET | Freq: Once | ORAL | Status: DC
  Filled 2017-10-04: qty 1

## 2017-10-04 NOTE — Patient Instructions (Signed)
Your procedure is scheduled on: October 09, 2017 [facility] Report to Day Surgery on the 2nd floor of the [facility]. To find out your arrival time, please call [phone] between 1PM - 3PM on: October 08, 2017 Tuesday   REMEMBER: Instructions that are not followed completely may result in serious medical risk, up to and including death; or upon the discretion of your surgeon and anesthesiologist your surgery may need to be rescheduled.  Do not eat food after midnight the night before surgery.  No gum chewing, lozengers or hard candies.  You may however, drink CLEAR liquids up to 2 hours before you are scheduled to arrive for your surgery. Do not drink anything within 2 hours of the start of your surgery.  Clear liquids include: - water  - apple juice without pulp - gatorade - black coffee or tea (Do NOT add milk or creamers to the coffee or tea) Do NOT drink anything that is not on this list.  Type 1 and Type 2 diabetics should only drink water.  No Alcohol for 24 hours before or after surgery.  No Smoking including e-cigarettes for 24 hours prior to surgery.  No chewable tobacco products for at least 6 hours prior to surgery.  No nicotine patches on the day of surgery.  On the morning of surgery brush your teeth with toothpaste and water, you may rinse your mouth with mouthwash if you wish. Do not swallow any toothpaste or mouthwash.  Notify your doctor if there is any change in your medical condition (cold, fever, infection).  Do not wear jewelry, make-up, hairpins, clips or nail polish.  Do not wear lotions, powders, or perfumes. You may wear deodorant.  Do not shave 48 hours prior to surgery. Men may shave face and neck.  Contacts and dentures may not be worn into surgery.  Do not bring valuables to the hospital, including drivers license, insurance or credit cards.  [facility] is not responsible for any belongings or valuables.   TAKE THESE MEDICATIONS THE MORNING  OF SURGERY: AMLODIPINE PROTONIX  SHOWER THE MORNING OF SURGERY WITH DIAL SOAP  Follow recommendations from Cardiologist, Pulmonologist or PCP regarding stopping Aspirin  Stop Anti-inflammatories (NSAIDS) such as  Advil, Aleve, Ibuprofen, Motrin, Naproxen, Naprosyn and Aspirin based products such as Excedrin, Goodys Powder, BC Powder. (May take Tylenol or Acetaminophen if needed.)  Stop ANY OVER THE COUNTER supplements until after surgery. FISH OIL  (May continue Vitamin D, Vitamin B, and multivitamin.)  Wear comfortable clothing (specific to your surgery type) to the hospital.  Plan for stool softeners for home use.  If you are being admitted to the hospital overnight, leave your suitcase in the car. After surgery it may be brought to your room.  If you are being discharged the day of surgery, you will not be allowed to drive home. You will need a responsible adult to drive you home and stay with you that night.   If you are taking public transportation, you will need to have a responsible adult with you. Please confirm with your physician that it is acceptable to use public transportation.   Please call [phone] if you have any questions about these instructions.

## 2017-10-07 ENCOUNTER — Ambulatory Visit: Payer: Medicare Other

## 2017-10-07 VITALS — BP 140/70 | HR 65 | Temp 97.9°F | Resp 15 | Ht 69.0 in | Wt 242.9 lb

## 2017-10-07 DIAGNOSIS — Z0000 Encounter for general adult medical examination without abnormal findings: Secondary | ICD-10-CM | POA: Diagnosis not present

## 2017-10-07 NOTE — Patient Instructions (Addendum)
  [name] , Thank you for taking time to come for your Medicare Wellness Visit. I appreciate your ongoing commitment to your health goals. Please review the following plan we discussed and let me know if I can assist you in the future.   These are the goals we discussed: Goals    . Weight (lb) < 200 lb (90.7 kg)     Stay hydrated Low carb foods, portion control Stay active and continue walking for exercise       This is a list of the screening recommended for you and due dates:  Health Maintenance  Topic Date Due  . Flu Shot  10/03/2017  . Tetanus Vaccine  08/12/2024  . Pneumonia vaccines  Completed

## 2017-10-07 NOTE — Progress Notes (Signed)
Subjective:   [name] is a 77 y.o. male who presents for Medicare Annual/Subsequent preventive examination.  Review of Systems:  No ROS.  Medicare Wellness Visit. Additional risk factors are reflected in the social history. Cardiac Risk Factors include: advanced age (>57men, >101 women);male gender;hypertension;obesity (BMI >30kg/m2)     Objective:    Vitals: BP 140/70 (BP Location: Right Arm, Patient Position: Sitting, Cuff Size: Normal)   Pulse 65   Temp 97.9 F (36.6 C) (Oral)   Resp 15   Ht 5\' 9"  (1.753 m)   Wt 242 lb 14.4 oz (110.2 kg)   SpO2 96%   BMI 35.87 kg/m   Body mass index is 35.87 kg/m.  Advanced Directives 10/07/2017 10/04/2017 04/02/2017 10/15/2016 08/15/2016 10/12/2015 07/20/2015  Does Patient Have a Medical Advance Directive? No No No No No No No  Would patient like information on creating a medical advance directive? No - Patient declined No - Patient declined - No - Patient declined No - Patient declined No - patient declined information No - patient declined information    Tobacco Social History   Tobacco Use  Smoking Status Never Smoker  Smokeless Tobacco Never Used     Counseling given: Not Answered   Clinical Intake:  Pre-visit preparation completed: Yes  Pain : No/denies pain Pain Score: 0-No pain     Nutritional Status: BMI > 30  Obese Diabetes: No  How often do you need to have someone help you when you read instructions, pamphlets, or other written materials from your doctor or pharmacy?: 1 - Never  Interpreter Needed?: No     Past Medical History:  Diagnosis Date  . Allergy   . Anxiety   . Arthritis   . Cataract    beginning stage bilateral  . Elevated PSA   . GERD (gastroesophageal reflux disease)   . Gout   . History of kidney stones   . Hyperlipidemia   . Hypertension   . Inguinal hernia    Past Surgical History:  Procedure Laterality Date  . BACK SURGERY  1980's   L4/5  . CARPAL TUNNEL RELEASE Right 08/15/2016     Procedure: CARPAL TUNNEL RELEASE ENDOSCOPIC;  Surgeon: Corky Mull, MD;  Location: [facility];  Service: Orthopedics;  Laterality: Right;  . COLONOSCOPY    . ESOPHAGOGASTRODUODENOSCOPY  11-21-06   Esophagitis, Stricture/dilated   . LYMPH NODE BIOPSY  1957   benign  lymph node removed  . Right arm fracture    . TONSILLECTOMY    . TOTAL KNEE ARTHROPLASTY  09-02-02   Bilateral  . UPPER GASTROINTESTINAL ENDOSCOPY     Family History  Problem Relation Age of Onset  . Ulcers Father   . Diverticulitis Father   . Arthritis Father   . Hypertension Brother   . Obesity Brother   . Cancer Brother        throat  . Arthritis Mother   . Depression Mother        Anxiety  . Colon cancer Neg Hx   . Colon polyps Neg Hx   . Esophageal cancer Neg Hx   . Rectal cancer Neg Hx   . Stomach cancer Neg Hx   . Kidney disease Neg Hx   . Kidney cancer Neg Hx   . Prostate cancer Neg Hx    Social History   Socioeconomic History  . Marital status: Married    Spouse name: Not on file  . Number of children: 1  .  Years of education: Not on file  . Highest education level: Not on file  Occupational History  . Occupation: Retired Theatre stage manager: [employer]    Comment: Programme researcher, broadcasting/film/video   Social Needs  . Financial resource strain: Not hard at all  . Food insecurity:    Worry: Never true    Inability: Never true  . Transportation needs:    Medical: No    Non-medical: No  Tobacco Use  . Smoking status: Never Smoker  . Smokeless tobacco: Never Used  Substance and Sexual Activity  . Alcohol use: No  . Drug use: No  . Sexual activity: Yes  Lifestyle  . Physical activity:    Days per week: 5 days    Minutes per session: 60 min  . Stress: Not at all  Relationships  . Social connections:    Talks on phone: Not on file    Gets together: Not on file    Attends religious service: Not on file    Active member of club or organization: Not on file    Attends meetings of clubs or  organizations: Not on file    Relationship status: Not on file  Other Topics Concern  . Not on file  Social History Narrative   Lives in [place] with wife. From [place], [place]. 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 Daughter, [name], lives in [place]. No pets.      Exercise: regular   Diet: increased sweets    Outpatient Encounter Medications as of 10/07/2017  Medication Sig  . acyclovir ointment (ZOVIRAX) 5 % Apply topically every 3 (three) hours. As needed for cold sores  . amLODipine (NORVASC) 5 MG tablet TAKE 1 TABLET (5 MG TOTAL) BY MOUTH DAILY.  Marland Kitchen aspirin 81 MG tablet Take 81 mg by mouth daily.  . colchicine (COLCRYS) 0.6 MG tablet Take 1 tablet (0.6 mg total) by mouth daily as needed (When having a gout flare up).  . fluocinonide (LIDEX) 0.05 % cream Apply topically daily.    . metoprolol succinate (TOPROL-XL) 50 MG 24 hr tablet TAKE 1 TABLET BY MOUTH DAILY. APPT NEEDED FOR FURTHER REFILLS. PLEASE CALL OFFICE SOON.  . Misc Natural Products (TART CHERRY ADVANCED) CAPS Take 425 capsules by mouth daily.  . Multiple Vitamins-Minerals ([employer] SPECTRAVITE SENIOR) TABS Take by mouth. 1 by mouth every am   . Omega-3 Fatty Acids (THERAGRAN-M FISH OIL CONC) 1200 MG CAPS Take by mouth daily.    . pantoprazole (PROTONIX) 40 MG tablet TAKE 1 TABLET BY MOUTH TWICE A DAY  . [DISCONTINUED] meloxicam (MOBIC) 15 MG tablet TAKE 1 TABLET (15 MG TOTAL) BY MOUTH ONCE DAILY.  . [DISCONTINUED] traMADol (ULTRAM) 50 MG tablet Take 1 tablet (50 mg total) by mouth every 6 (six) hours as needed.   Facility-Administered Encounter Medications as of 10/07/2017  Medication  . 0.9 %  sodium chloride infusion    Activities of Daily Living In your present state of health, do you have any difficulty performing the following activities: 10/07/2017 10/04/2017  Hearing? N N  Vision? N N  Difficulty concentrating or making decisions? N N  Walking or climbing stairs? N N  Dressing or bathing? N N  Doing  errands, shopping? N N  Preparing Food and eating ? N -  Using the Toilet? N -  In the past six months, have you accidently leaked urine? Y -  Comment Followed by Urology and pcp.  -  Do you have problems with  loss of bowel control? N -  Managing your Medications? N -  Managing your Finances? N -  Housekeeping or managing your Housekeeping? N -  Some recent data might be hidden    Patient Care Team: Leone Haven, MD as PCP - General (Family Medicine) Dasher, Rayvon Char, MD (Dermatology) Birder Robson, MD as Referring Physician (Ophthalmology) Gaynelle Arabian, MD (Orthopedic Surgery)   Assessment:   This is a routine wellness examination for [facility]. The goal of the wellness visit is to assist the patient how to close the gaps in care and create a preventative care plan for the patient.   The roster of all physicians providing medical care to patient is listed in the Snapshot section of the chart.  Osteoporosis risk reviewed.    Safety issues reviewed; Smoke and carbon monoxide detectors in the home. No firearms in the home. Wears seatbelts when driving or riding with others. No violence in the home.  They do not have excessive sun exposure.  Discussed the need for sun protection: hats, long sleeves and the use of sunscreen if there is significant sun exposure.  Patient is alert, normal appearance, oriented to person/place/and time. Correctly identified the president of the [place] and recalls of 3/3 words.Performs simple calculations and can read correct time from watch face. Displays appropriate judgement.  No new identified risk were noted.  No failures at ADL's or IADL's.    BMI- discussed the importance of a healthy diet, water intake and the benefits of aerobic exercise. Educational material provided.   24 hour diet recall: Modified diet  Dental- every 6 months.  Sleep patterns- Sleeps without issues.    Health maintenance gaps- closed.  Patient Concerns: None at  this time. Follow up with PCP as needed.  Exercise Activities and Dietary recommendations Current Exercise Habits: Home exercise routine, Type of exercise: walking, Time (Minutes): 60, Frequency (Times/Week): 5, Weekly Exercise (Minutes/Week): 300, Intensity: Moderate  Goals    . Weight (lb) < 200 lb (90.7 kg)     Stay hydrated Low carb foods, portion control Stay active and continue walking for exercise       Fall Risk Fall Risk  10/07/2017 10/15/2016 10/15/2016 10/12/2015 07/20/2015  Falls in the past year? No No No No No   Depression Screen PHQ 2/9 Scores 10/07/2017 10/15/2016 10/12/2015 07/20/2015  PHQ - 2 Score 0 0 0 0  PHQ- 9 Score - 0 - -    Cognitive Function MMSE - Mini Mental State Exam 10/07/2017 10/15/2016 10/12/2015  Orientation to time 5 5 5   Orientation to Place 5 5 5   Registration 3 3 3   Attention/ Calculation 5 5 5   Recall 3 3 3   Language- name 2 objects 2 2 2   Language- repeat 1 1 1   Language- follow 3 step command 3 3 3   Language- read & follow direction 1 1 1   Write a sentence 1 1 1   Copy design 1 1 1   Total score 30 30 30         Immunization History  Administered Date(s) Administered  . Influenza Whole 01/03/2006, 12/24/2011  . Influenza,inj,Quad PF,6+ Mos 12/15/2012, 10/28/2014  . Influenza-Unspecified 12/25/2013, 11/08/2015, 10/20/2016  . Pneumococcal Conjugate-13 06/30/2013  . Pneumococcal Polysaccharide-23 06/21/2006  . Td 08/19/2002  . Tdap 08/13/2014  . Zoster 12/28/2009   Screening Tests Health Maintenance  Topic Date Due  . INFLUENZA VACCINE  10/03/2017  . TETANUS/TDAP  08/12/2024  . PNA vac Low Risk Adult  Completed  Plan:   End of life planning; Advanced aging; Advanced directives discussed.  No HCPOA/Living Will.  Additional information declined at this time.  I have personally reviewed and noted the following in the patient's chart:   . Medical and social history . Use of alcohol, tobacco or illicit drugs  . Current medications  and supplements . Functional ability and status . Nutritional status . Physical activity . Advanced directives . List of other physicians . Hospitalizations, surgeries, and ER visits in previous 12 months . Vitals . Screenings to include cognitive, depression, and falls . Referrals and appointments  In addition, I have reviewed and discussed with patient certain preventive protocols, quality metrics, and best practice recommendations. A written personalized care plan for preventive services as well as general preventive health recommendations were provided to patient.     Varney Biles, LPN  0/03/6427

## 2017-10-08 MED ORDER — CIPROFLOXACIN IN D5W 400 MG/200ML IV SOLN
400.0000 mg | INTRAVENOUS | Status: AC
  Administered 2017-10-09: 400 mg via INTRAVENOUS

## 2017-10-09 ENCOUNTER — Ambulatory Visit
Admission: RE | Admit: 2017-10-09 | Discharge: 2017-10-09 | Disposition: A | Discharge status: Home / Self Care | Payer: Medicare Other | Source: Ambulatory Visit | Attending: Urology | Admitting: Urology

## 2017-10-09 ENCOUNTER — Other Ambulatory Visit: Payer: Self-pay

## 2017-10-09 ENCOUNTER — Ambulatory Visit: Payer: Medicare Other | Admitting: Certified Registered Nurse Anesthetist

## 2017-10-09 ENCOUNTER — Encounter
Admission: RE | Disposition: A | Discharge status: Home / Self Care | Payer: Self-pay | Source: Ambulatory Visit | Attending: Urology

## 2017-10-09 DIAGNOSIS — Z880 Allergy status to penicillin: Secondary | ICD-10-CM | POA: Diagnosis not present

## 2017-10-09 DIAGNOSIS — N320 Bladder-neck obstruction: Secondary | ICD-10-CM | POA: Insufficient documentation

## 2017-10-09 DIAGNOSIS — I10 Essential (primary) hypertension: Secondary | ICD-10-CM | POA: Insufficient documentation

## 2017-10-09 DIAGNOSIS — M109 Gout, unspecified: Secondary | ICD-10-CM | POA: Diagnosis not present

## 2017-10-09 DIAGNOSIS — R310 Gross hematuria: Secondary | ICD-10-CM | POA: Insufficient documentation

## 2017-10-09 DIAGNOSIS — Z87442 Personal history of urinary calculi: Secondary | ICD-10-CM | POA: Insufficient documentation

## 2017-10-09 DIAGNOSIS — Z7982 Long term (current) use of aspirin: Secondary | ICD-10-CM | POA: Insufficient documentation

## 2017-10-09 DIAGNOSIS — E785 Hyperlipidemia, unspecified: Secondary | ICD-10-CM | POA: Diagnosis not present

## 2017-10-09 DIAGNOSIS — K219 Gastro-esophageal reflux disease without esophagitis: Secondary | ICD-10-CM | POA: Diagnosis not present

## 2017-10-09 DIAGNOSIS — Z886 Allergy status to analgesic agent status: Secondary | ICD-10-CM | POA: Insufficient documentation

## 2017-10-09 DIAGNOSIS — K760 Fatty (change of) liver, not elsewhere classified: Secondary | ICD-10-CM | POA: Insufficient documentation

## 2017-10-09 DIAGNOSIS — Z79899 Other long term (current) drug therapy: Secondary | ICD-10-CM | POA: Insufficient documentation

## 2017-10-09 DIAGNOSIS — N401 Enlarged prostate with lower urinary tract symptoms: Secondary | ICD-10-CM | POA: Insufficient documentation

## 2017-10-09 DIAGNOSIS — N138 Other obstructive and reflux uropathy: Secondary | ICD-10-CM | POA: Insufficient documentation

## 2017-10-09 DIAGNOSIS — N210 Calculus in bladder: Secondary | ICD-10-CM | POA: Insufficient documentation

## 2017-10-09 DIAGNOSIS — F419 Anxiety disorder, unspecified: Secondary | ICD-10-CM | POA: Diagnosis not present

## 2017-10-09 HISTORY — PX: HOLEP-LASER ENUCLEATION OF THE PROSTATE WITH MORCELLATION: SHX6641

## 2017-10-09 HISTORY — PX: CYSTOSCOPY WITH LITHOLAPAXY: SHX1425

## 2017-10-09 SURGERY — ENUCLEATION, PROSTATE, USING LASER, WITH MORCELLATION
Anesthesia: General | Wound class: Clean Contaminated

## 2017-10-09 MED ORDER — PROPOFOL 10 MG/ML IV BOLUS
INTRAVENOUS | Status: DC | PRN
  Administered 2017-10-09: 150 mg via INTRAVENOUS

## 2017-10-09 MED ORDER — FENTANYL CITRATE (PF) 100 MCG/2ML IJ SOLN
INTRAMUSCULAR | Status: AC
  Filled 2017-10-09: qty 2

## 2017-10-09 MED ORDER — SUGAMMADEX SODIUM 200 MG/2ML IV SOLN
INTRAVENOUS | Status: AC
  Filled 2017-10-09: qty 2

## 2017-10-09 MED ORDER — SEVOFLURANE IN SOLN
RESPIRATORY_TRACT | Status: AC
  Filled 2017-10-09: qty 250

## 2017-10-09 MED ORDER — SUGAMMADEX SODIUM 500 MG/5ML IV SOLN: INTRAVENOUS | Status: DC | PRN

## 2017-10-09 MED ORDER — ROCURONIUM BROMIDE 50 MG/5ML IV SOLN
INTRAVENOUS | Status: AC
  Filled 2017-10-09: qty 1

## 2017-10-09 MED ORDER — OXYBUTYNIN CHLORIDE 5 MG PO TABS: 5.0000 mg | ORAL_TABLET | Freq: Three times a day (TID) | ORAL | 0 refills | Status: DC | PRN

## 2017-10-09 MED ORDER — ONDANSETRON HCL 4 MG/2ML IJ SOLN: 4.0000 mg | Freq: Once | INTRAMUSCULAR | Status: DC | PRN

## 2017-10-09 MED ORDER — OXYCODONE HCL 5 MG PO TABS
ORAL_TABLET | ORAL | Status: AC
  Administered 2017-10-09: 5 mg
  Filled 2017-10-09: qty 1

## 2017-10-09 MED ORDER — SUGAMMADEX SODIUM 500 MG/5ML IV SOLN
INTRAVENOUS | Status: DC | PRN
  Administered 2017-10-09: 219.8 mg via INTRAVENOUS

## 2017-10-09 MED ORDER — BELLADONNA ALKALOIDS-OPIUM 16.2-60 MG RE SUPP
RECTAL | Status: AC
Start: 2017-10-09 — End: 2017-10-09
  Administered 2017-10-09: 1 via RECTAL
  Filled 2017-10-09: qty 1

## 2017-10-09 MED ORDER — LACTATED RINGERS IV SOLN
INTRAVENOUS | Status: DC
  Administered 2017-10-09 (×2): via INTRAVENOUS

## 2017-10-09 MED ORDER — FENTANYL CITRATE (PF) 100 MCG/2ML IJ SOLN
INTRAMUSCULAR | Status: DC | PRN
  Administered 2017-10-09: 100 ug via INTRAVENOUS

## 2017-10-09 MED ORDER — DOCUSATE SODIUM 100 MG PO CAPS: 100.0000 mg | ORAL_CAPSULE | Freq: Two times a day (BID) | ORAL | 0 refills | Status: DC | PRN

## 2017-10-09 MED ORDER — FENTANYL CITRATE (PF) 100 MCG/2ML IJ SOLN
INTRAMUSCULAR | Status: AC
  Administered 2017-10-09: 25 ug via INTRAVENOUS
  Filled 2017-10-09: qty 2

## 2017-10-09 MED ORDER — EPHEDRINE SULFATE 50 MG/ML IJ SOLN
INTRAMUSCULAR | Status: DC | PRN
  Administered 2017-10-09 (×2): 10 mg via INTRAVENOUS

## 2017-10-09 MED ORDER — FUROSEMIDE 10 MG/ML IJ SOLN
INTRAMUSCULAR | Status: DC | PRN
  Administered 2017-10-09: 10 mg via INTRAMUSCULAR

## 2017-10-09 MED ORDER — ROCURONIUM BROMIDE 100 MG/10ML IV SOLN
INTRAVENOUS | Status: DC | PRN
  Administered 2017-10-09: 40 mg via INTRAVENOUS
  Administered 2017-10-09 (×6): 10 mg via INTRAVENOUS

## 2017-10-09 MED ORDER — BELLADONNA ALKALOIDS-OPIUM 16.2-60 MG RE SUPP
1.0000 | Freq: Once | RECTAL | Status: AC | PRN
Start: 2017-10-09 — End: 2017-10-09
  Administered 2017-10-09: 1 via RECTAL

## 2017-10-09 MED ORDER — ONDANSETRON HCL 4 MG/2ML IJ SOLN
INTRAMUSCULAR | Status: DC | PRN
  Administered 2017-10-09: 4 mg via INTRAVENOUS

## 2017-10-09 MED ORDER — OXYCODONE HCL 5 MG PO TABS: 5.0000 mg | ORAL_TABLET | ORAL | 0 refills | Status: DC | PRN

## 2017-10-09 MED ORDER — LIDOCAINE HCL (CARDIAC) PF 100 MG/5ML IV SOSY
PREFILLED_SYRINGE | INTRAVENOUS | Status: DC | PRN
  Administered 2017-10-09: 80 mg via INTRAVENOUS

## 2017-10-09 MED ORDER — CIPROFLOXACIN IN D5W 400 MG/200ML IV SOLN
INTRAVENOUS | Status: AC
  Filled 2017-10-09: qty 200

## 2017-10-09 MED ORDER — FUROSEMIDE 10 MG/ML IJ SOLN
INTRAMUSCULAR | Status: AC
  Filled 2017-10-09: qty 4

## 2017-10-09 MED ORDER — OXYCODONE HCL 5 MG PO TABS: 5.0000 mg | ORAL_TABLET | ORAL | Status: DC | PRN

## 2017-10-09 MED ORDER — FENTANYL CITRATE (PF) 100 MCG/2ML IJ SOLN
25.0000 ug | INTRAMUSCULAR | Status: DC | PRN
  Administered 2017-10-09 (×2): 25 ug via INTRAVENOUS

## 2017-10-09 MED ORDER — PROPOFOL 10 MG/ML IV BOLUS
INTRAVENOUS | Status: AC
  Filled 2017-10-09: qty 20

## 2017-10-09 SURGICAL SUPPLY — 39 items
ADAPTER IRRIG TUBE 2 SPIKE SOL (ADAPTER) ×6 IMPLANT
ADPR TBG 2 SPK PMP STRL ASCP (ADAPTER) ×2
BAG DRAIN CYSTO-URO LG1000N (MISCELLANEOUS) ×3 IMPLANT
BAG DRN LRG CPC RND TRDRP CNTR (MISCELLANEOUS)
BAG URINE DRAINAGE (UROLOGICAL SUPPLIES) IMPLANT
BAG URO DRAIN 4000ML (MISCELLANEOUS) IMPLANT
BASKET ZERO TIP 1.9FR (BASKET) ×1 IMPLANT
BSKT STON RTRVL ZERO TP 1.9FR (BASKET)
CATH FOL 2WAY LX 20X30 (CATHETERS) ×2 IMPLANT
CATH FOL 2WAY LX 22X30 (CATHETERS) ×2 IMPLANT
CATH FOLEY 3WAY 30CC 22FR (CATHETERS) IMPLANT
CATH URETL 5X70 OPEN END (CATHETERS) ×3 IMPLANT
CONTAINER COLLECT MORCELLATR (MISCELLANEOUS) ×1 IMPLANT
DRAPE SHEET LG 3/4 BI-LAMINATE (DRAPES) ×3 IMPLANT
DRAPE UTILITY 15X26 TOWEL STRL (DRAPES) IMPLANT
FILTER OVERFLOW MORCELLATOR (FILTER) ×1 IMPLANT
GLOVE BIO SURGEON STRL SZ 6.5 (GLOVE) ×4 IMPLANT
GLOVE BIO SURGEONS STRL SZ 6.5 (GLOVE) ×2
GOWN STRL REUS W/ TWL LRG LVL3 (GOWN DISPOSABLE) ×2 IMPLANT
GOWN STRL REUS W/TWL LRG LVL3 (GOWN DISPOSABLE) ×6
HOLDER FOLEY CATH W/STRAP (MISCELLANEOUS) ×3 IMPLANT
KIT TURNOVER CYSTO (KITS) ×3 IMPLANT
LASER FIBER 550M SMARTSCOPE (Laser) ×3 IMPLANT
MORCELLATOR COLLECT CONTAINER (MISCELLANEOUS) ×3
MORCELLATOR OVERFLOW FILTER (FILTER) ×3
MORCELLATOR ROTATION 4.75 335 (MISCELLANEOUS) ×3 IMPLANT
PACK CYSTO AR (MISCELLANEOUS) ×3 IMPLANT
SENSORWIRE 0.038 NOT ANGLED (WIRE)
SET CYSTO W/LG BORE CLAMP LF (SET/KITS/TRAYS/PACK) IMPLANT
SET IRRIG Y TYPE TUR BLADDER L (SET/KITS/TRAYS/PACK) ×3 IMPLANT
SLEEVE PROTECTION STRL DISP (MISCELLANEOUS) ×6 IMPLANT
SOL .9 NS 3000ML IRR  AL (IV SOLUTION) ×30
SOL .9 NS 3000ML IRR AL (IV SOLUTION) ×15
SOL .9 NS 3000ML IRR UROMATIC (IV SOLUTION) ×4 IMPLANT
SYRINGE IRR TOOMEY STRL 70CC (SYRINGE) ×3 IMPLANT
TUBE PUMP MORCELLATOR PIRANHA (TUBING) ×3 IMPLANT
WATER STERILE IRR 1000ML POUR (IV SOLUTION) ×3 IMPLANT
WATER STERILE IRR 3000ML UROMA (IV SOLUTION) ×3 IMPLANT
WIRE SENSOR 0.038 NOT ANGLED (WIRE) IMPLANT

## 2017-10-09 NOTE — Progress Notes (Signed)
Family was given option for leg bag and instructions but did not feel they wanted to use

## 2017-10-09 NOTE — Interval H&P Note (Signed)
History and Physical Interval Note:  10/09/2017 11:08 AM  [name]  has presented today for surgery, with the diagnosis of BPH, bladder stones  The various methods of treatment have been discussed with the patient and family. After consideration of risks, benefits and other options for treatment, the patient has consented to  Procedure(s): [place] WITH MORCELLATION (N/A) CYSTOSCOPY WITH LITHOLAPAXY (N/A) as a surgical intervention .  The patient's history has been reviewed, patient examined, no change in status, stable for surgery.  I have reviewed the patient's chart and labs.  Questions were answered to the patient's satisfaction.     Hollice Espy

## 2017-10-09 NOTE — Anesthesia Preprocedure Evaluation (Signed)
Anesthesia Evaluation  Patient identified by MRN, date of birth, ID band Patient awake    Reviewed: Allergy & Precautions, H&P , NPO status , Patient's Chart, lab work & pertinent test results, reviewed documented beta blocker date and time   Airway Mallampati: II  TM Distance: >3 FB Neck ROM: full    Dental  (+) Teeth Intact   Pulmonary neg pulmonary ROS,    Pulmonary exam normal        Cardiovascular Exercise Tolerance: Good hypertension, On Medications negative cardio ROS Normal cardiovascular exam Rate:Normal     Neuro/Psych Anxiety  Neuromuscular disease negative neurological ROS  negative psych ROS   GI/Hepatic negative GI ROS, Neg liver ROS, GERD  Medicated,  Endo/Other  negative endocrine ROSMorbid obesity  Renal/GU Renal diseasenegative Renal ROS  negative genitourinary   Musculoskeletal   Abdominal   Peds  Hematology negative hematology ROS (+)   Anesthesia Other Findings   Reproductive/Obstetrics negative OB ROS                             Anesthesia Physical Anesthesia Plan  ASA: III  Anesthesia Plan: General LMA   Post-op Pain Management:    Induction:   PONV Risk Score and Plan:   Airway Management Planned:   Additional Equipment:   Intra-op Plan:   Post-operative Plan:   Informed Consent: I have reviewed the patients History and Physical, chart, labs and discussed the procedure including the risks, benefits and alternatives for the proposed anesthesia with the patient or authorized representative who has indicated his/her understanding and acceptance.     Plan Discussed with: CRNA  Anesthesia Plan Comments:         Anesthesia Quick Evaluation

## 2017-10-09 NOTE — Anesthesia Procedure Notes (Signed)
Procedure Name: Intubation Date/Time: 10/09/2017 11:35 AM Performed by: Allean Found, CRNA Pre-anesthesia Checklist: Patient identified, Patient being monitored, Timeout performed, Emergency Drugs available and Suction available Patient Re-evaluated:Patient Re-evaluated prior to induction Oxygen Delivery Method: Circle system utilized Preoxygenation: Pre-oxygenation with 100% oxygen Induction Type: IV induction Ventilation: Mask ventilation without difficulty Laryngoscope Size: Mac, 3, 4 and McGraph Grade View: Grade II Tube type: Oral Tube size: 7.5 mm Number of attempts: 1 Airway Equipment and Method: Stylet Placement Confirmation: ETT inserted through vocal cords under direct vision,  positive ETCO2 and breath sounds checked- equal and bilateral Secured at: 23 cm Tube secured with: Tape Dental Injury: Teeth and Oropharynx as per pre-operative assessment  Comments: 1st laryngoscopy was with Mac4, could only view epiglottis, switched to Kila 4 , perfect view of glottis.  BBSE

## 2017-10-09 NOTE — Discharge Instructions (Signed)
You have blood in your urine.  This is normal.  If it becomes thick or clotted or the catheter is not draining, he will need to come to our office during office hours or go to the emergency room.  These continue to hold aspirin and other anticoagulants until your urine is clear.    Be sure to drink plenty of water.   Indwelling Urinary Catheter Care, Adult Take good care of your catheter to keep it working and to prevent problems. How to wear your catheter Attach your catheter to your leg with tape (adhesive tape) or a leg strap. Make sure it is not too tight. If you use tape, remove any bits of tape that are already on the catheter. How to wear a drainage bag You should have:  A large overnight bag.  A small leg bag.  Overnight Bag You may wear the overnight bag at any time. Always keep the bag below the level of your bladder but off the floor. When you sleep, put a clean plastic bag in a wastebasket. Then hang the bag inside the wastebasket. Leg Bag Never wear the leg bag at night. Always wear the leg bag below your knee. Keep the leg bag secure with a leg strap or tape. How to care for your skin  Clean the skin around the catheter at least once every day.  Shower every day. Do not take baths.  Put creams, lotions, or ointments on your genital area only as told by your doctor.  Do not use powders, sprays, or lotions on your genital area. How to clean your catheter and your skin 1. Wash your hands with soap and water. 2. Wet a washcloth in warm water and gentle (mild) soap. 3. Use the washcloth to clean the skin where the catheter enters your body. Clean downward and wipe away from the catheter in small circles. Do not wipe toward the catheter. 4. Pat the area dry with a clean towel. Make sure to clean off all soap. How to care for your drainage bags Empty your drainage bag when it is ?- full or at least 2-3 times a day. Replace your drainage bag once a month or sooner if it  starts to smell bad or look dirty. Do not clean your drainage bag unless told by your doctor. Emptying a drainage bag  Supplies Needed  Rubbing alcohol.  Gauze pad or cotton ball.  Tape or a leg strap.  Steps 1. Wash your hands with soap and water. 2. Separate (detach) the bag from your leg. 3. Hold the bag over the toilet or a clean container. Keep the bag below your hips and bladder. This stops pee (urine) from going back into the tube. 4. Open the pour spout at the bottom of the bag. 5. Empty the pee into the toilet or container. Do not let the pour spout touch any surface. 6. Put rubbing alcohol on a gauze pad or cotton ball. 7. Use the gauze pad or cotton ball to clean the pour spout. 8. Close the pour spout. 9. Attach the bag to your leg with tape or a leg strap. 10. Wash your hands.  Changing a drainage bag Supplies Needed  Alcohol wipes.  A clean drainage bag.  Adhesive tape or a leg strap.  Steps 1. Wash your hands with soap and water. 2. Separate the dirty bag from your leg. 3. Pinch the rubber catheter with your fingers so that pee does not spill out. 4. Separate the  catheter tube from the drainage tube where these tubes connect (at the connection valve). Do not let the tubes touch any surface. 5. Clean the end of the catheter tube with an alcohol wipe. Use a different alcohol wipe to clean the end of the drainage tube. 6. Connect the catheter tube to the drainage tube of the clean bag. 7. Attach the new bag to the leg with adhesive tape or a leg strap. 8. Wash your hands.  How to prevent infection and other problems  Never pull on your catheter or try to remove it. Pulling can damage tissue in your body.  Always wash your hands before and after touching your catheter.  If a leg strap gets wet, replace it with a dry one.  Drink enough fluids to keep your pee clear or pale yellow, or as told by your doctor.  Do not let the drainage bag or tubing touch the  floor.  Wear cotton underwear.  If you are male, wipe from front to back after you poop (have a bowel movement).  Check on the catheter often to make sure it works and the tubing is not twisted. Get help if:  Your pee is cloudy.  Your pee smells unusually bad.  Your pee is not draining into the bag.  Your tube gets clogged.  Your catheter starts to leak.  Your bladder feels full. Get help right away if:  You have redness, swelling, or pain where the catheter enters your body.  You have fluid, pus, or a bad smell coming from the area where the catheter enters your body.  The area where the catheter enters your body feels warm.  You have a fever.  You have pain in your: ? Stomach (abdomen). ? Legs. ? Lower back. ? Bladder.  You see blood fill the catheter.  Your pee is pink or red.  You feel sick to your stomach (nauseous).  You throw up (vomit).  You have chills.  Your catheter gets pulled out. This information is not intended to replace advice given to you by your health care provider. Make sure you discuss any questions you have with your health care provider. Document Released: 06/16/2012 Document Revised: 01/18/2016 Document Reviewed: 08/04/2013 Elsevier Interactive Patient Education  [employer].

## 2017-10-09 NOTE — Anesthesia Post-op Follow-up Note (Signed)
Anesthesia QCDR form completed.        

## 2017-10-09 NOTE — Op Note (Signed)
Date of procedure: 10/09/17  Preoperative diagnosis:  1. Bladder stones 2. BPH with outlet obstruction  Postoperative diagnosis:  1. Same as above  Procedure: 1. Cystolitholapaxy 2. Holmium laser enucleation of the prostate with morcellation    Surgeon: Hollice Espy, MD  Anesthesia: General  Complications: None  Intraoperative findings: Multiple small bladder stones along with 2 dominant Jack-like appearing bladder stones measuring up to 2 cm.  Massive prostamegaly with intravesical protrusion, no discrete median lobe appreciated.  EBL: 100 cc  Specimens: Prostate chips  Drains: 76 French two-way Foley catheter with 30 cc balloon  Indication: [name] is a 77 y.o. patient with gross hematuria secondary to large bladder stones and evidence of outlet obstruction.  After reviewing the management options for treatment, he elected to proceed with the above surgical procedure(s). We have discussed the potential benefits and risks of the procedure, side effects of the proposed treatment, the likelihood of the patient achieving the goals of the procedure, and any potential problems that might occur during the procedure or recuperation. Informed consent has been obtained.  Description of procedure:  The patient was taken to the operating room and general anesthesia was induced.  The patient was placed in the dorsal lithotomy position, prepped and draped in the usual sterile fashion, and preoperative antibiotics were administered. A preoperative time-out was performed.   A 26 French resectoscope was advanced per urethra into the the bladder using an angled blunt obturator.  This was somewhat difficult due to his prostatic anatomy.  Once inside the bladder, innumerable tiny round bladder stones were encountered along with 2 dominant stones, one which had a jack like stone appearance measuring up to 2 cm.  The other dominant stone measuring approximately 1 cm.  The bladder was  trabeculated.  The bladder neck was elevated but the trigone was visible and each UO could be seen easily.  A 550 m laser fiber was then brought in and using the settings use during the holep, the stones were fragmented until there is small enough to be irrigated from the bladder.  Notably, there was a small mucosal disruption of the posterior bladder wall to the layer of the muscularis secondary to an injury from the laser.  There is no concern for perforation beyond the muscularis propria.  There is minimal bleeding.    Next, attention was turned to enucleation of the prostate.  The prostatic anatomy was carefully inspected.  Although there was a significantly elevated bladder neck, there was no discrete median lobe.  Using the same 550 m laser fiber in the settings of 1.9 J and 53 Hz, 2 incisions were made at the bladder neck at the 5:00 and 7:00 positions and carried down to meet in the midline just proximal to the verumontanum.  A small amount of prostate tissue representing a tiny median lobe was enucleated by separating the adenoma from the capsular tissue rolling it from a caudal to cranial direction until it was ultimately able to be cleaved from the bladder neck.  Next, a curvilinear incision was created at the apex of the left lateral lobe again separating the adenoma from the underlying capsule.  Notably, the adenoma did shell out quite nicely and there is an excellent plane between these 2 structures.  Ultimately, by enucleating again from a caudal to cranial position I was able to roll the adenoma towards the bladder neck and ultimately into the bladder.  Ultimately, the bladder neck mucosa was cleaved and I was able to  free a massive left-sided median lobe.  I did incise the mucosa on the anterior commissure as well.  There is a small amount of undermining of the left bladder neck appreciated with a slight disruption of the prostatic capsule at one location.  The trigone on the side is carefully  inspected and the UO was noted to be effluxing clear yellow urine.  Next, attention was turned to the right lateral lobe.  After enucleation of the left lobe, the right lateral lobe had a significantly less obstructing appearance.  Made the same curvy linear incision at the right apex and used the same technique as aforementioned.  This lobe was much smaller and ended up being removed in a piece wise fashion.  Less tissue was enucleated from the side which will help with urinary incontinence.  Finally, hemostasis was achieved using laser settings of 1.5 J and 30 Hz.  The UO on the right also had a reflux of clear yellow urine.  Finally, using a nephroscope, the prone morcellator was used to morcellate the prostatic specimens.  Care was taken to ensure that the bladder was significantly distended during morcellation to avoid any injury.  The final piece of tissue was grasped using graspers and brought out with the scope itself.  Hemostasis appeared to be appropriate.  10 mg of IV Lasix was administered to help with diuresis postoperatively.  A 20 French two-way Foley catheter was inserted using a catheter guide into the bladder.  The bladder was irrigated several times which irrigated well.  There is no residual tissue or clots appreciated.  The balloon was filled with 30 cc of sterile water.  Patient was then clean and dry, repositioned supine position, reversed from anesthesia, and taken to the PACU in stable condition.  Plan: Patient will come to have his catheter removed on Monday in our office.  I will see him in 6 weeks in follow-up.  All questions answered.  Hollice Espy, M.D.

## 2017-10-09 NOTE — Transfer of Care (Signed)
Immediate Anesthesia Transfer of Care Note  Patient: [name]  Procedure(s) Performed: HOLEP-LASER ENUCLEATION OF THE PROSTATE WITH MORCELLATION (N/A ) CYSTOSCOPY WITH LITHOLAPAXY (N/A )  Patient Location: PACU  Anesthesia Type:General  Level of Consciousness: awake  Airway & Oxygen Therapy: Patient Spontanous Breathing and Patient connected to face mask oxygen  Post-op Assessment: Report given to RN and Post -op Vital signs reviewed and stable  Post vital signs: Reviewed and stable  Last Vitals:  Vitals Value Taken Time  BP 146/82 10/09/2017  2:59 PM  Temp 35.9 C 10/09/2017  2:59 PM  Pulse 52 10/09/2017  3:01 PM  Resp 20 10/09/2017  3:01 PM  SpO2 100 % 10/09/2017  3:01 PM  Vitals shown include unvalidated device data.  Last Pain:  Vitals:   10/09/17 1459  TempSrc:   PainSc: 0-No pain         Complications: No apparent anesthesia complications

## 2017-10-09 NOTE — Progress Notes (Signed)
Went over catheter care with family   States understanding   Catheter emptied of 500cc bloody urine prior to discharge but no clots noted

## 2017-10-10 ENCOUNTER — Encounter: Payer: Self-pay | Admitting: Urology

## 2017-10-14 ENCOUNTER — Ambulatory Visit (INDEPENDENT_AMBULATORY_CARE_PROVIDER_SITE_OTHER): Payer: Medicare Other

## 2017-10-14 DIAGNOSIS — N138 Other obstructive and reflux uropathy: Secondary | ICD-10-CM

## 2017-10-14 DIAGNOSIS — N401 Enlarged prostate with lower urinary tract symptoms: Secondary | ICD-10-CM

## 2017-10-14 NOTE — Progress Notes (Signed)
Catheter Removal  Patient is present today for a catheter removal.  18ml of water was drained from the balloon. A 20FR foley cath was removed from the bladder no complications were noted . Patient tolerated well.  Preformed by: Fonnie Jarvis, CMA  Follow up/ Additional notes: keep 6wk post op with Dr. Erlene Quan

## 2017-10-15 ENCOUNTER — Ambulatory Visit: Payer: Medicare Other

## 2017-10-16 ENCOUNTER — Ambulatory Visit: Payer: Medicare Other

## 2017-10-16 ENCOUNTER — Telehealth: Payer: Self-pay | Admitting: Urology

## 2017-10-16 LAB — SURGICAL PATHOLOGY

## 2017-10-16 NOTE — Telephone Encounter (Signed)
Call patient to inform him of small focus of prostate cancer in his specimen.  We will discuss this further at his follow-up visit.  He also mentioned today that he is a small amount of blood in his urine.  I assured him that this is normal.  He does also have some burning and stinging with urination which is stable since surgery.  I advised him if this persist through the end of the week, come in Friday for UA/urine culture to rule out infection although not suspected.  Hollice Espy, MD

## 2017-10-18 ENCOUNTER — Telehealth: Payer: Self-pay | Admitting: Family Medicine

## 2017-10-18 ENCOUNTER — Other Ambulatory Visit: Payer: Self-pay

## 2017-10-18 ENCOUNTER — Other Ambulatory Visit: Payer: Medicare Other

## 2017-10-18 DIAGNOSIS — N138 Other obstructive and reflux uropathy: Secondary | ICD-10-CM

## 2017-10-18 DIAGNOSIS — N401 Enlarged prostate with lower urinary tract symptoms: Principal | ICD-10-CM

## 2017-10-18 LAB — URINALYSIS, COMPLETE
Bilirubin, UA: NEGATIVE
GLUCOSE, UA: NEGATIVE
Ketones, UA: NEGATIVE
Nitrite, UA: NEGATIVE
SPEC GRAV UA: 1.025 (ref 1.005–1.030)
Urobilinogen, Ur: 0.2 mg/dL (ref 0.2–1.0)
pH, UA: 5 (ref 5.0–7.5)

## 2017-10-18 LAB — MICROSCOPIC EXAMINATION
Epithelial Cells (non renal): NONE SEEN /hpf (ref 0–10)
RBC, UA: >30 /hpf — ABNORMAL HIGH (ref 0–2)

## 2017-10-18 MED ORDER — SULFAMETHOXAZOLE-TRIMETHOPRIM 800-160 MG PO TABS: 1.0000 | ORAL_TABLET | Freq: Two times a day (BID) | ORAL | 0 refills | Status: DC

## 2017-10-18 NOTE — Telephone Encounter (Signed)
Please let this patient know that I would like to go ahead and call him in a prescription for Bactrim for the weekend.  His urine is mildly suspicious but it is difficult to tell since he so fresh postop.  We will send it for culture and adjust as needed.  Prescription sent to pharmacy.  Hollice Espy, MD

## 2017-10-18 NOTE — Telephone Encounter (Signed)
Patient notified and will pick up RX today.

## 2017-10-18 NOTE — Telephone Encounter (Signed)
A urine was dropped off today for post surgery evaluation. Please review and advise. Thank you

## 2017-10-24 ENCOUNTER — Encounter: Payer: Self-pay | Admitting: Urology

## 2017-10-24 ENCOUNTER — Telehealth: Payer: Self-pay

## 2017-10-24 LAB — CULTURE, URINE COMPREHENSIVE

## 2017-10-24 NOTE — Telephone Encounter (Signed)
-----   Message from Hollice Espy, MD sent at 10/24/2017  8:26 AM EDT ----- This is likely contamination rather than true infection.    Hollice Espy, MD

## 2017-10-24 NOTE — Anesthesia Postprocedure Evaluation (Signed)
Anesthesia Post Note  Patient: LOGAN BALTIMORE  Procedure(s) Performed: HOLEP-LASER ENUCLEATION OF THE PROSTATE WITH MORCELLATION (N/A ) CYSTOSCOPY WITH LITHOLAPAXY (N/A )  Patient location during evaluation: PACU Anesthesia Type: General Level of consciousness: awake and alert Pain management: pain level controlled Vital Signs Assessment: post-procedure vital signs reviewed and stable Respiratory status: spontaneous breathing, nonlabored ventilation, respiratory function stable and patient connected to nasal cannula oxygen Cardiovascular status: blood pressure returned to baseline and stable Postop Assessment: no apparent nausea or vomiting Anesthetic complications: no     Last Vitals:  Vitals:   10/09/17 1700 10/09/17 1711  BP:  (!) 142/82  Pulse:  (!) 50  Resp:    Temp:    SpO2: 97% 97%    Last Pain:  Vitals:   10/10/17 0808  TempSrc:   PainSc: 3                  Molli Barrows

## 2017-10-26 ENCOUNTER — Encounter: Payer: Self-pay | Admitting: Urology

## 2017-10-31 ENCOUNTER — Other Ambulatory Visit: Payer: Self-pay | Admitting: Family Medicine

## 2017-11-14 ENCOUNTER — Encounter: Payer: Self-pay | Admitting: Urology

## 2017-11-20 ENCOUNTER — Ambulatory Visit: Payer: Medicare Other | Admitting: Urology

## 2017-11-20 ENCOUNTER — Encounter: Payer: Self-pay | Admitting: Urology

## 2017-11-20 VITALS — BP 161/80 | HR 71

## 2017-11-20 DIAGNOSIS — R300 Dysuria: Secondary | ICD-10-CM

## 2017-11-20 DIAGNOSIS — C61 Malignant neoplasm of prostate: Secondary | ICD-10-CM

## 2017-11-20 DIAGNOSIS — N138 Other obstructive and reflux uropathy: Secondary | ICD-10-CM

## 2017-11-20 DIAGNOSIS — N401 Enlarged prostate with lower urinary tract symptoms: Secondary | ICD-10-CM

## 2017-11-20 DIAGNOSIS — N3941 Urge incontinence: Secondary | ICD-10-CM

## 2017-11-20 LAB — URINALYSIS, COMPLETE
BILIRUBIN UA: NEGATIVE
GLUCOSE, UA: NEGATIVE
Ketones, UA: NEGATIVE
Nitrite, UA: NEGATIVE
Specific Gravity, UA: >1.03 — ABNORMAL HIGH (ref 1.005–1.030)
UUROB: 0.2 mg/dL (ref 0.2–1.0)
pH, UA: 5.5 (ref 5.0–7.5)

## 2017-11-20 LAB — MICROSCOPIC EXAMINATION: Epithelial Cells (non renal): NONE SEEN /hpf (ref 0–10)

## 2017-11-20 LAB — BLADDER SCAN AMB NON-IMAGING

## 2017-11-20 NOTE — Progress Notes (Signed)
11/20/2017 8:11 AM   [name] [birth year] [id number]  Referring provider: Leone Haven, MD [address] [place], [place] [address]  Chief Complaint  Patient presents with  . Benign Prostatic Hypertrophy    6wk post op    HPI: 77 year old male status post holmium laser enucleation of the prostate/cystolitholapaxy on 10/09/2017 he returns today for follow-up.  Surgical pathology shows 6 7 g of resected tissue, with incidental finding of Gleason 3+4, grade group 2 and less than 5% of the tissue.  There is also acute and chronic inflammation.  He also has a personal history of elevated PSA status post negative biopsy in 2017.  His PSA remains stably elevated, most recently 6.03-2017.  Preop prostate volume 120 cc.   He reports voiding symptoms postoperatively.  He initially had burning and penile pain with urination which is improving but is not completely resolved.  He did come by drop off a UA/urine culture previously which include nonclonal bacteria.  He is also had urgency and frequency as well as a few episodes of urge incontinence.  No stress incontinence.  No fevers or chills.  He is having to wear due to concern for incontinence which is bothersome to him.    No gross hematuria.     PMH: Past Medical History:  Diagnosis Date  . Allergy   . Anxiety   . Arthritis   . Cataract    beginning stage bilateral  . Elevated PSA   . GERD (gastroesophageal reflux disease)   . Gout   . History of kidney stones   . Hyperlipidemia   . Hypertension   . Inguinal hernia     Surgical History: Past Surgical History:  Procedure Laterality Date  . BACK SURGERY  1980's   L4/5  . CARPAL TUNNEL RELEASE Right 08/15/2016   Procedure: CARPAL TUNNEL RELEASE ENDOSCOPIC;  Surgeon: Corky Mull, MD;  Location: [facility];  Service: Orthopedics;  Laterality: Right;  . COLONOSCOPY    . CYSTOSCOPY WITH LITHOLAPAXY N/A 10/09/2017   Procedure: CYSTOSCOPY WITH  LITHOLAPAXY;  Surgeon: Hollice Espy, MD;  Location: [facility];  Service: Urology;  Laterality: N/A;  . ESOPHAGOGASTRODUODENOSCOPY  11-21-06   Esophagitis, Stricture/dilated   . HOLEP-LASER ENUCLEATION OF THE PROSTATE WITH MORCELLATION N/A 10/09/2017   Procedure: HOLEP-LASER ENUCLEATION OF THE PROSTATE WITH MORCELLATION;  Surgeon: Hollice Espy, MD;  Location: [facility];  Service: Urology;  Laterality: N/A;  . LYMPH NODE BIOPSY  1957   benign  lymph node removed  . Right arm fracture    . TONSILLECTOMY    . TOTAL KNEE ARTHROPLASTY  09-02-02   Bilateral  . UPPER GASTROINTESTINAL ENDOSCOPY      Home Medications:  Allergies as of 11/20/2017      Reactions   Penicillins Rash   Acetaminophen Itching   Aspirin Itching   OK w/low dose aspirin       Medication List        Accurate as of 11/20/17 11:59 PM. Always use your most recent med list.          acyclovir ointment 5 % Commonly known as:  ZOVIRAX Apply topically every 3 (three) hours. As needed for cold sores   amLODipine 5 MG tablet Commonly known as:  NORVASC TAKE 1 TABLET (5 MG TOTAL) BY MOUTH DAILY.   aspirin 81 MG tablet Take 81 mg by mouth daily.   colchicine 0.6 MG tablet Take 1 tablet (0.6 mg total) by mouth daily as  needed (When having a gout flare up).   [employer] SPECTRAVITE SENIOR Tabs Take by mouth. 1 by mouth every am   docusate sodium 100 MG capsule Commonly known as:  COLACE Take 1 capsule (100 mg total) by mouth 2 (two) times daily as needed (take to keep stool soft.).   fluocinonide cream 0.05 % Commonly known as:  LIDEX Apply topically daily.   metoprolol succinate 50 MG 24 hr tablet Commonly known as:  TOPROL-XL TAKE 1 TABLET BY MOUTH DAILY. APPT NEEDED FOR FURTHER REFILLS. PLEASE CALL OFFICE SOON.   pantoprazole 40 MG tablet Commonly known as:  PROTONIX TAKE 1 TABLET BY MOUTH TWICE A DAY   TART CHERRY ADVANCED Caps Take 425 capsules by mouth daily.   THERAGRAN-M FISH OIL CONC 1200 MG  Caps Take by mouth daily.       Allergies:  Allergies  Allergen Reactions  . Penicillins Rash  . Acetaminophen Itching  . Aspirin Itching    OK w/low dose aspirin     Family History: Family History  Problem Relation Age of Onset  . Ulcers Father   . Diverticulitis Father   . Arthritis Father   . Hypertension Brother   . Obesity Brother   . Cancer Brother        throat  . Arthritis Mother   . Depression Mother        Anxiety  . Colon cancer Neg Hx   . Colon polyps Neg Hx   . Esophageal cancer Neg Hx   . Rectal cancer Neg Hx   . Stomach cancer Neg Hx   . Kidney disease Neg Hx   . Kidney cancer Neg Hx   . Prostate cancer Neg Hx     Social History:  reports that he has never smoked. He has never used smokeless tobacco. He reports that he does not drink alcohol or use drugs.  ROS: UROLOGY Frequent Urination?: No Hard to postpone urination?: No Burning/pain with urination?: No Get up at night to urinate?: No Leakage of urine?: No Urine stream starts and stops?: No Trouble starting stream?: No Do you have to strain to urinate?: No Blood in urine?: No Urinary tract infection?: No Sexually transmitted disease?: No Injury to kidneys or bladder?: No Painful intercourse?: No Weak stream?: No Erection problems?: No Penile pain?: No  Gastrointestinal Nausea?: No Vomiting?: No Indigestion/heartburn?: No Diarrhea?: No Constipation?: No  Constitutional Fever: No Night sweats?: No Weight loss?: No Fatigue?: No  Skin Skin rash/lesions?: No Itching?: No  Eyes Blurred vision?: No Double vision?: No  Ears/Nose/Throat Sore throat?: No Sinus problems?: No  Hematologic/Lymphatic Swollen glands?: No Easy bruising?: No  Cardiovascular Leg swelling?: No Chest pain?: No  Respiratory Cough?: No Shortness of breath?: No  Endocrine Excessive thirst?: No  Musculoskeletal Back pain?: No Joint pain?: No  Neurological Headaches?: No Dizziness?:  No  Psychologic Depression?: No Anxiety?: No  Physical Exam: BP (!) 161/80   Pulse 71   Constitutional:  Alert and oriented, No acute distress.  Accompanied by wife today.   HEENT: [name], moist mucus membranes.  Trachea midline, no masses. Cardiovascular: No clubbing, cyanosis, or edema. Respiratory: Normal respiratory effort, no increased work of breathing. Skin: No rashes, bruises or suspicious lesions. Neurologic: Grossly intact, no focal deficits, moving all 4 extremities. Psychiatric: Normal mood and affect.  Laboratory Data: Lab Results  Component Value Date   WBC 7.4 10/04/2017   HGB 14.4 10/04/2017   HCT 41.2 10/04/2017   MCV 88.5 10/04/2017  PLT 181 10/04/2017    Lab Results  Component Value Date   CREATININE 1.33 (H) 10/04/2017    Lab Results  Component Value Date   PSA 5.45 (H) 07/20/2015   PSA 6.58 (H) 07/16/2014   PSA 7.50 (H) 06/30/2013   Lab Results  Component Value Date   HGBA1C 5.6 06/26/2006    Urinalysis Results for orders placed or performed in visit on 11/20/17  Microscopic Examination  Result Value Ref Range   WBC, UA 11-30 (A) 0 - 5 /hpf   RBC, UA 11-30 (A) 0 - 2 /hpf   Epithelial Cells (non renal) None seen 0 - 10 /hpf   Mucus, UA Present (A) Not Estab.   Bacteria, UA Moderate (A) None seen/Few  Urinalysis, Complete  Result Value Ref Range   Specific Gravity, UA >1.030 (H) 1.005 - 1.030   pH, UA 5.5 5.0 - 7.5   Color, UA Yellow Yellow   Appearance Ur Clear Clear   Leukocytes, UA 1+ (A) Negative   Protein, UA 2+ (A) Negative/Trace   Glucose, UA Negative Negative   Ketones, UA Negative Negative   RBC, UA 3+ (A) Negative   Bilirubin, UA Negative Negative   Urobilinogen, Ur 0.2 0.2 - 1.0 mg/dL   Nitrite, UA Negative Negative   Microscopic Examination See below:   BLADDER SCAN AMB NON-IMAGING  Result Value Ref Range   Scan Result 3ml      Pertinent Imaging: n/a  Assessment & Plan:    1. BPH with obstruction/lower  urinary tract symptoms Status post holmium laser enucleation Previously relatively asymptomatic other than for bladder stones We will continue to monitor his urinary symptoms and anticipate continued improvement Adequate bladder emptying today - BLADDER SCAN AMB NON-IMAGING  2. Prostate cancer [facility]) Incidental newly diagnosed low-volume Gleason 3+4 prostate cancer Preop PSA for volume We had extensive conversation today about the natural history of prostate cancer as well as various treatment options.  We discussed that with intermediate risk prostate cancer, especially favorable pathology, surgery, radiation, or observation could be considered if volume is low.  He is a poor surgical candidate given his large volume prostate, recent holmium laser enucleation and age. We further explored options of active surveillance versus radiation.  At this point in time, I would favor observation with very close follow-up.  We will plan to recheck his PSA in 2 months for new baseline PSA may consider prostate MRI with consideration of fusion biopsy in the future.  Would prefer to hold off on any radiation at this time given he has persistent urinary symptoms or recent HoLEP and allow for full recovery.  He is agreeable this plan.  3. Dysuria UA mildly suspicious today, could represent either infection or postoperative UA Low threshold to treat,  will hold off on treatment based on urine culture and sensitivity data - Urinalysis, Complete - CULTURE, URINE COMPREHENSIVE  4. Urge incontinence of urine Urgency frequency with urge incontinence which is improving but still bothersome He was given samples of Myrbetriq 25 mg today x1 month to see if this helps with his symptoms Despite the symptoms will be temporary   Return in about 2 months (around 01/20/2018) for PSA prior.  Hollice Espy, MD  [facility] [address] [place], [place] [address] [phone]  I spent 25 min with this patient of which greater than 50% was spent in counseling and coordination of care with the patient.

## 2017-11-23 LAB — CULTURE, URINE COMPREHENSIVE

## 2017-11-27 ENCOUNTER — Other Ambulatory Visit: Payer: Self-pay | Admitting: Family Medicine

## 2017-11-27 DIAGNOSIS — I10 Essential (primary) hypertension: Secondary | ICD-10-CM

## 2017-11-27 NOTE — Telephone Encounter (Signed)
Last refill pt will ned an OV for more refills

## 2017-11-29 ENCOUNTER — Other Ambulatory Visit: Payer: Self-pay | Admitting: Family Medicine

## 2017-12-19 ENCOUNTER — Other Ambulatory Visit: Payer: Self-pay | Admitting: Family Medicine

## 2017-12-20 NOTE — Telephone Encounter (Signed)
Last fill 11/01/17 Last OV 10/15/16  Ok to fill?

## 2017-12-21 NOTE — Telephone Encounter (Signed)
30-day refill sent to pharmacy.  Please call him and get him set up for an appointment.  He will need an appointment for further refills.  Thanks.

## 2018-01-06 ENCOUNTER — Other Ambulatory Visit: Payer: Self-pay | Admitting: Family Medicine

## 2018-01-06 DIAGNOSIS — M109 Gout, unspecified: Secondary | ICD-10-CM

## 2018-01-15 ENCOUNTER — Other Ambulatory Visit: Payer: Self-pay | Admitting: Family Medicine

## 2018-01-15 NOTE — Telephone Encounter (Signed)
It has been over a year since the patient has been seen by me in the office.  He needs to be scheduled for a follow-up visit in the next month for his blood pressure.  This could be scheduled at 430 on a Monday, Wednesday, or Friday.  This will need to be scheduled prior to be refilling this medication.

## 2018-01-16 ENCOUNTER — Other Ambulatory Visit: Payer: Self-pay

## 2018-01-16 DIAGNOSIS — C61 Malignant neoplasm of prostate: Secondary | ICD-10-CM

## 2018-01-17 ENCOUNTER — Other Ambulatory Visit: Payer: Medicare Other

## 2018-01-17 ENCOUNTER — Ambulatory Visit: Payer: Medicare Other

## 2018-01-17 DIAGNOSIS — C61 Malignant neoplasm of prostate: Secondary | ICD-10-CM

## 2018-01-17 NOTE — Telephone Encounter (Signed)
I have schedule patient 11:15 today to come in for BP check so he can have his prescription refilled.

## 2018-01-17 NOTE — Telephone Encounter (Signed)
Short-term refill sent in.  Did this patient come in for BP check?  Thanks.

## 2018-01-18 LAB — PSA: PROSTATE SPECIFIC AG, SERUM: 3.4 ng/mL (ref 0.0–4.0)

## 2018-01-21 NOTE — Progress Notes (Signed)
01/21/2018 10:35 AM   [name] 11-Jan-1941 [id number]  Referring provider: Leone Haven, MD [address] [place], [place] [address]  Chief Complaint  Patient presents with  . Follow-up    HPI: [name]  returns today for management and evaluation of BPH with obstruction/ower urinary tract symptoms and prostate cancer. The patient's last visit with [place] was on 11/20/2017.   The patient reports an increase in urination frequency and reports going to bathroom 3x per day s/p surgery as opposed to 3x per day prior to surgery. Pt reports of having a weak stream while urinating. Pt denies any changes in the volume of water he drinks per day. Pt also denies any use of medications prior to surgery.  He was offered samples of Myrbetriq several months ago which he took for a month and these did help his urinary urgency and frequency type symptoms.  He is interested in continuing this medication.  He denies any dysuria or gross hematuria.  His UA today shows 6-10 WBC, otherwise unremarkable.   He has a Garment/textile technologist. The surgical pathology showed 6 7 g of resected tissue, with incidental finding of Gleason 3+4, grade group 2 and less than 5% of the tissue. There was also acute and chronic inflammation.   He also has a personal history of elevated PSA status post negative biopsy in 2017.His PSA remains stably elevated, most recently 6.03-2017.  Preop prostate volume 120 cc.   His most recent PSA is 3.4 ng/mL from 01/17/18 (new baseline).    IPSS    Row Name 01/22/18 0900         International Prostate Symptom Score   How often have you had the sensation of not emptying your bladder?  Not at All     How often have you had to urinate less than every two hours?  Less than half the time     How often have you found you stopped and started again several times when you urinated?  More than half the time     How often have you found it difficult to  postpone urination?  More than half the time     How often have you had a weak urinary stream?  More than half the time     How often have you had to strain to start urination?  Not at All     How many times did you typically get up at night to urinate?  3 Times     Total IPSS Score  17       Quality of Life due to urinary symptoms   If you were to spend the rest of your life with your urinary condition just the way it is now how would you feel about that?  Mostly Satisfied        Score:  1-7 Mild 8-19 Moderate 20-35 Severe  PSA Trend 01/17/18, 3.4 ng/mL  06/24/17, 6.1 ng/mL  PMH: Past Medical History:  Diagnosis Date  . Allergy   . Anxiety   . Arthritis   . Cataract    beginning stage bilateral  . Elevated PSA   . GERD (gastroesophageal reflux disease)   . Gout   . History of kidney stones   . Hyperlipidemia   . Hypertension   . Inguinal hernia     Surgical History: Past Surgical History:  Procedure Laterality Date  . BACK SURGERY  1980's   L4/5  . CARPAL TUNNEL  RELEASE Right 08/15/2016   Procedure: CARPAL TUNNEL RELEASE ENDOSCOPIC;  Surgeon: Corky Mull, MD;  Location: [place];  Service: Orthopedics;  Laterality: Right;  . COLONOSCOPY    . CYSTOSCOPY WITH LITHOLAPAXY N/A 10/09/2017   Procedure: CYSTOSCOPY WITH LITHOLAPAXY;  Surgeon: Hollice Espy, MD;  Location: [facility];  Service: Urology;  Laterality: N/A;  . ESOPHAGOGASTRODUODENOSCOPY  11-21-06   Esophagitis, Stricture/dilated   . HOLEP-LASER ENUCLEATION OF THE PROSTATE WITH MORCELLATION N/A 10/09/2017   Procedure: HOLEP-LASER ENUCLEATION OF THE PROSTATE WITH MORCELLATION;  Surgeon: Hollice Espy, MD;  Location: [facility];  Service: Urology;  Laterality: N/A;  . LYMPH NODE BIOPSY  1957   benign  lymph node removed  . Right arm fracture    . TONSILLECTOMY    . TOTAL KNEE ARTHROPLASTY  09-02-02   Bilateral  . UPPER GASTROINTESTINAL ENDOSCOPY      Home Medications:  Allergies as of 01/22/2018        Reactions   Penicillins Rash   Acetaminophen Itching   Aspirin Itching   OK w/low dose aspirin       Medication List        Accurate as of 01/22/18 10:35 AM. Always use your most recent med list.          acyclovir ointment 5 % Commonly known as:  ZOVIRAX Apply topically every 3 (three) hours. As needed for cold sores   amLODipine 5 MG tablet Commonly known as:  NORVASC TAKE 1 TABLET (5 MG TOTAL) BY MOUTH DAILY.   aspirin 81 MG tablet Take 81 mg by mouth daily.   colchicine 0.6 MG tablet TAKE 1 TABLET (0.6 MG TOTAL) BY MOUTH DAILY AS NEEDED (WHEN HAVING A GOUT FLARE UP).   [employer] SPECTRAVITE SENIOR Tabs Take by mouth. 1 by mouth every am   metoprolol succinate 50 MG 24 hr tablet Commonly known as:  TOPROL-XL TAKE 1 TABLET BY MOUTH DAILY. APPT NEEDED FOR FURTHER REFILLS. PLEASE CALL OFFICE SOON.   mirabegron ER 25 MG Tb24 tablet Commonly known as:  MYRBETRIQ Take 1 tablet (25 mg total) by mouth daily.   pantoprazole 40 MG tablet Commonly known as:  PROTONIX TAKE 1 TABLET BY MOUTH TWICE A DAY   TART CHERRY ADVANCED Caps Take 425 capsules by mouth daily.   THERAGRAN-M FISH OIL CONC 1200 MG Caps Take by mouth daily.       Allergies:  Allergies  Allergen Reactions  . Penicillins Rash  . Acetaminophen Itching  . Aspirin Itching    OK w/low dose aspirin     Family History: Family History  Problem Relation Age of Onset  . Ulcers Father   . Diverticulitis Father   . Arthritis Father   . Hypertension Brother   . Obesity Brother   . Cancer Brother        throat  . Arthritis Mother   . Depression Mother        Anxiety  . Colon cancer Neg Hx   . Colon polyps Neg Hx   . Esophageal cancer Neg Hx   . Rectal cancer Neg Hx   . Stomach cancer Neg Hx   . Kidney disease Neg Hx   . Kidney cancer Neg Hx   . Prostate cancer Neg Hx     Social History:  reports that he has never smoked. He has never used smokeless tobacco. He reports that he does not drink  alcohol or use drugs.  ROS: UROLOGY Frequent Urination?: No Hard to postpone  urination?: No Burning/pain with urination?: No Get up at night to urinate?: No Leakage of urine?: No Urine stream starts and stops?: No Trouble starting stream?: No Do you have to strain to urinate?: No Blood in urine?: No Urinary tract infection?: No Sexually transmitted disease?: No Injury to kidneys or bladder?: No Painful intercourse?: No Weak stream?: No Erection problems?: No Penile pain?: No  Gastrointestinal Nausea?: No Vomiting?: No Indigestion/heartburn?: No Diarrhea?: No Constipation?: No  Constitutional Fever: No Night sweats?: No Weight loss?: No Fatigue?: No  Skin Skin rash/lesions?: No Itching?: No  Eyes Blurred vision?: No Double vision?: No  Ears/Nose/Throat Sore throat?: No Sinus problems?: No  Hematologic/Lymphatic Swollen glands?: No Easy bruising?: No  Cardiovascular Leg swelling?: No Chest pain?: No  Respiratory Cough?: No Shortness of breath?: No  Endocrine Excessive thirst?: No  Musculoskeletal Back pain?: No Joint pain?: No  Neurological Headaches?: No Dizziness?: No  Psychologic Depression?: No Anxiety?: No  Physical Exam: BP (!) 163/75   Pulse 67   Wt 242 lb (109.8 kg)   BMI 35.74 kg/m   Constitutional:  Alert and oriented, No acute distress. HEENT: [place] AT, moist mucus membranes.  Trachea midline, no masses. Cardiovascular: No clubbing, cyanosis, or edema. Respiratory: Normal respiratory effort, no increased work of breathing. Rectal: Prostate greater than 50 cc, non-tender, no nodules   Skin: No rashes, bruises or suspicious lesions. Neurologic: Grossly intact, no focal deficits, moving all 4 extremities. Psychiatric: Normal mood and affect.  Laboratory Data: Lab Results  Component Value Date   WBC 7.4 10/04/2017   HGB 14.4 10/04/2017   HCT 41.2 10/04/2017   MCV 88.5 10/04/2017   PLT 181 10/04/2017    Lab Results    Component Value Date   CREATININE 1.33 (H) 10/04/2017    Lab Results  Component Value Date   PSA 5.45 (H) 07/20/2015   PSA 6.58 (H) 07/16/2014   PSA 7.50 (H) 06/30/2013    Urinalysis UA as above  Pertinent Imaging: Results for orders placed or performed in visit on 01/22/18  Bladder Scan (Post Void Residual) in office  Result Value Ref Range   Scan Result 56ml    Assessment & Plan:    1. BPH with obstruction/lower urinary tract symptoms Status post holmium laser enucleation Previously relatively asymptomatic other than for bladder stones Adequate bladder emptying today - BLADDER SCAN AMB NON-IMAGING  2. Prostate cancer [facility]) Incidental newly diagnosed low-volume Gleason 3+4 prostate cancer Per previous discussions, options including radiation and surveillance were offered He has elected to pursue surveillance Rectal exam today reassuring, new PSA baseline ~3 Plan for repeat PSA in 6 months, consider prostate MRI versus biopsy at 1 year interval  3. Urinary frequency Resume Myrbetriq 25 mg, recheck urinary symptoms next visit  Return in about 6 months (around 07/23/2018) for IPSS / PVR / DRE / PSA.  [facility] [address] [place], [place] [address] [phone]  I, Lucas Mallow, am acting as a scribe for Dr. Hollice Espy,  I have reviewed the above documentation for accuracy and completeness, and I agree with the above.   Hollice Espy, MD

## 2018-01-22 ENCOUNTER — Encounter: Payer: Self-pay | Admitting: Urology

## 2018-01-22 ENCOUNTER — Ambulatory Visit: Payer: Medicare Other | Admitting: Urology

## 2018-01-22 ENCOUNTER — Encounter: Payer: Self-pay | Admitting: Family Medicine

## 2018-01-22 ENCOUNTER — Ambulatory Visit: Payer: Medicare Other | Admitting: Family Medicine

## 2018-01-22 VITALS — BP 163/75 | HR 67 | Wt 242.0 lb

## 2018-01-22 DIAGNOSIS — C61 Malignant neoplasm of prostate: Secondary | ICD-10-CM | POA: Diagnosis not present

## 2018-01-22 DIAGNOSIS — E785 Hyperlipidemia, unspecified: Secondary | ICD-10-CM

## 2018-01-22 DIAGNOSIS — I10 Essential (primary) hypertension: Secondary | ICD-10-CM

## 2018-01-22 DIAGNOSIS — N138 Other obstructive and reflux uropathy: Secondary | ICD-10-CM | POA: Diagnosis not present

## 2018-01-22 DIAGNOSIS — R350 Frequency of micturition: Secondary | ICD-10-CM | POA: Diagnosis not present

## 2018-01-22 DIAGNOSIS — N401 Enlarged prostate with lower urinary tract symptoms: Secondary | ICD-10-CM

## 2018-01-22 DIAGNOSIS — M25562 Pain in left knee: Secondary | ICD-10-CM

## 2018-01-22 LAB — URINALYSIS, COMPLETE
Bilirubin, UA: NEGATIVE
Glucose, UA: NEGATIVE
KETONES UA: NEGATIVE
NITRITE UA: NEGATIVE
PH UA: 5 (ref 5.0–7.5)
SPEC GRAV UA: 1.025 (ref 1.005–1.030)
Urobilinogen, Ur: 0.2 mg/dL (ref 0.2–1.0)

## 2018-01-22 LAB — MICROSCOPIC EXAMINATION: Epithelial Cells (non renal): NONE SEEN /hpf (ref 0–10)

## 2018-01-22 LAB — BLADDER SCAN AMB NON-IMAGING

## 2018-01-22 MED ORDER — METOPROLOL SUCCINATE ER 50 MG PO TB24: ORAL_TABLET | ORAL | 1 refills | Status: DC

## 2018-01-22 MED ORDER — MIRABEGRON ER 25 MG PO TB24: 25.0000 mg | ORAL_TABLET | Freq: Every day | ORAL | 11 refills | Status: DC

## 2018-01-22 MED ORDER — AMLODIPINE BESYLATE 5 MG PO TABS: 5.0000 mg | ORAL_TABLET | Freq: Every day | ORAL | 1 refills | Status: DC

## 2018-01-22 NOTE — Patient Instructions (Signed)
Nice to see you. Please continue your blood pressure medication. Please see the orthopedist as planned.

## 2018-01-22 NOTE — Assessment & Plan Note (Signed)
Undetermined cause.  Offered x-ray though we decided to defer it to orthopedics.  He will keep his appointment with orthopedics.

## 2018-01-22 NOTE — Assessment & Plan Note (Signed)
Well-controlled.  Continue current regimen.  Refills given.  Most recent left right panel reviewed.

## 2018-01-22 NOTE — Progress Notes (Signed)
  Tommi Rumps, MD Phone: [phone]  [name] is a 77 y.o. male who presents today for follow-up.  CC: Hypertension, prostate cancer, left knee pain  Hypertension: Not checking at home.  Taking amlodipine and metoprolol.  No chest pain or shortness of breath.  He is able to see a sock line at times.  Goes down overnight.  No orthopnea or PND.  He is on his feet all day at work.  Prostate cancer: Incidentally noted on [facility]P pathology.  He has followed up with urology and they are doing watchful waiting.  He notes no blood in his urine.  Notes his PSA was around 3.  Left knee pain: Patient is status post bilateral knee replacements about 15 years ago.  Notes his left knee will occasionally give out and be quite painful when it does that.  Otherwise not much of an issue with it.  He does have an appointment with orthopedics.  No injury.  Social History   Tobacco Use  Smoking Status Never Smoker  Smokeless Tobacco Never Used     ROS see history of present illness  Objective  Physical Exam Vitals:   01/22/18 1631  BP: 128/70  Pulse: 62  Temp: 98.3 F (36.8 C)  SpO2: 97%    BP Readings from Last 3 Encounters:  01/22/18 128/70  01/22/18 (!) 163/75  11/20/17 (!) 161/80   Wt Readings from Last 3 Encounters:  01/22/18 243 lb 3.2 oz (110.3 kg)  01/22/18 242 lb (109.8 kg)  10/09/17 242 lb 3.2 oz (109.9 kg)    Physical Exam  Constitutional: No distress.  Cardiovascular: Normal rate, regular rhythm and normal heart sounds.  Pulmonary/Chest: Effort normal and breath sounds normal.  Musculoskeletal: He exhibits no edema.  Bilateral knees with no warmth, swelling, or tenderness  Neurological: He is alert.  Skin: Skin is warm and dry. He is not diaphoretic.     Assessment/Plan: Please see individual problem list.  Essential hypertension Well-controlled.  Continue current regimen.  Refills given.  Most recent left right panel reviewed.  Prostate cancer  ([place]) Incidentally found.  He will continue to follow with urology.  Left knee pain Undetermined cause.  Offered x-ray though we decided to defer it to orthopedics.  He will keep his appointment with orthopedics.  Hyperlipidemia The patient is greater than age 32.  He has had no cardiovascular events.  Discussed at this time there is no role for medication.  Consider periodically checking a lipid panel.  No orders of the defined types were placed in this encounter.   Meds ordered this encounter  Medications  . metoprolol succinate (TOPROL-XL) 50 MG 24 hr tablet    Sig: TAKE 1 TABLET BY MOUTH DAILY.    Dispense:  90 tablet    Refill:  1  . amLODipine (NORVASC) 5 MG tablet    Sig: Take 1 tablet (5 mg total) by mouth daily.    Dispense:  90 tablet    Refill:  1     Tommi Rumps, MD [place]

## 2018-01-22 NOTE — Assessment & Plan Note (Signed)
The patient is greater than age 77.  He has had no cardiovascular events.  Discussed at this time there is no role for medication.  Consider periodically checking a lipid panel.

## 2018-01-22 NOTE — Assessment & Plan Note (Signed)
Incidentally found.  He will continue to follow with urology.

## 2018-03-20 ENCOUNTER — Other Ambulatory Visit: Payer: Self-pay | Admitting: Orthopedic Surgery

## 2018-03-20 ENCOUNTER — Other Ambulatory Visit (HOSPITAL_COMMUNITY): Payer: Self-pay | Admitting: Orthopedic Surgery

## 2018-03-20 DIAGNOSIS — Z96653 Presence of artificial knee joint, bilateral: Secondary | ICD-10-CM

## 2018-03-26 ENCOUNTER — Encounter
Admission: RE | Admit: 2018-03-26 | Discharge: 2018-03-26 | Disposition: A | Discharge status: Home / Self Care | Payer: Medicare Other | Source: Ambulatory Visit | Attending: Orthopedic Surgery | Admitting: Orthopedic Surgery

## 2018-03-26 DIAGNOSIS — Z96653 Presence of artificial knee joint, bilateral: Secondary | ICD-10-CM | POA: Diagnosis present

## 2018-03-26 MED ORDER — TECHNETIUM TC 99M MEDRONATE IV KIT
23.6400 | PACK | Freq: Once | INTRAVENOUS | Status: AC | PRN
  Administered 2018-03-26: 23.64 via INTRAVENOUS

## 2018-04-02 ENCOUNTER — Other Ambulatory Visit: Payer: Self-pay | Admitting: Radiology

## 2018-04-02 DIAGNOSIS — C61 Malignant neoplasm of prostate: Secondary | ICD-10-CM

## 2018-05-19 ENCOUNTER — Encounter: Payer: Self-pay | Admitting: Family Medicine

## 2018-06-26 ENCOUNTER — Encounter: Payer: Self-pay | Admitting: Family Medicine

## 2018-06-27 ENCOUNTER — Other Ambulatory Visit: Payer: Self-pay

## 2018-06-27 ENCOUNTER — Ambulatory Visit (INDEPENDENT_AMBULATORY_CARE_PROVIDER_SITE_OTHER): Payer: Medicare Other | Admitting: Family Medicine

## 2018-06-27 DIAGNOSIS — L03114 Cellulitis of left upper limb: Secondary | ICD-10-CM

## 2018-06-27 MED ORDER — DOXYCYCLINE HYCLATE 100 MG PO TABS: 100.0000 mg | ORAL_TABLET | Freq: Two times a day (BID) | ORAL | 0 refills | Status: DC

## 2018-06-27 NOTE — Progress Notes (Signed)
Patient ID: [name], male   DOB: 01-Oct-1940, 78 y.o.   MRN: [id number]  Virtual Visit via video Note  This visit type was conducted due to national recommendations for restrictions regarding the COVID-19 pandemic (e.g. social distancing).  This format is felt to be most appropriate for this patient at this time.  All issues noted in this document were discussed and addressed.  No physical exam was performed (except for noted visual exam findings with Video Visits).   I connected with [name] on 06/27/18 at  2:00 PM EDT by a video enabled telemedicine application and verified that I am speaking with the correct person using two identifiers. Location patient: home Location provider: [place] Persons participating in the virtual visit: patient, provider  I discussed the limitations, risks, security and privacy concerns of performing an evaluation and management service by telephone and the availability of in person appointments. I also discussed with the patient that there may be a patient responsible charge related to this service. The patient expressed understanding and agreed to proceed.   HPI:  Patient and I connected via video due to swelling and redness of left elbow.  Denies any known injury to the elbow, but states it is possible he did bump elbow.  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  He does wear a mask while at work.  States he has pain in the left elbow when he straightens it for pain [place].  States the left elbow is warm to touch.  Will describe the swelling about as small -- does not stick out like a gumball or egg.   No issues with range of motion of fingers of hand or wrist.  No issues with range of motion of shoulder.  Patient does report a history of gout, but that is only occurred in a toe.  Denies fever or chills.  Denies cough, shortness of breath or wheezing.  Denies chest  pain.  Denies GI or GU issues.  ROS: See pertinent positives and negatives per HPI.  Past Medical History:  Diagnosis Date  . Allergy   . Anxiety   . Arthritis   . Cataract    beginning stage bilateral  . Elevated PSA   . GERD (gastroesophageal reflux disease)   . Gout   . History of kidney stones   . Hyperlipidemia   . Hypertension   . Inguinal hernia     Past Surgical History:  Procedure Laterality Date  . BACK SURGERY  1980's   L4/5  . CARPAL TUNNEL RELEASE Right 08/15/2016   Procedure: CARPAL TUNNEL RELEASE ENDOSCOPIC;  Surgeon: Corky Mull, MD;  Location: [place];  Service: Orthopedics;  Laterality: Right;  . COLONOSCOPY    . CYSTOSCOPY WITH LITHOLAPAXY N/A 10/09/2017   Procedure: CYSTOSCOPY WITH LITHOLAPAXY;  Surgeon: Hollice Espy, MD;  Location: [facility];  Service: Urology;  Laterality: N/A;  . ESOPHAGOGASTRODUODENOSCOPY  11-21-06   Esophagitis, Stricture/dilated   . HOLEP-LASER ENUCLEATION OF THE PROSTATE WITH MORCELLATION N/A 10/09/2017   Procedure: HOLEP-LASER ENUCLEATION OF THE PROSTATE WITH MORCELLATION;  Surgeon: Hollice Espy, MD;  Location: [facility];  Service: Urology;  Laterality: N/A;  . LYMPH NODE BIOPSY  1957   benign  lymph node removed  . Right arm fracture    . TONSILLECTOMY    . TOTAL KNEE ARTHROPLASTY  09-02-02   Bilateral  . UPPER GASTROINTESTINAL ENDOSCOPY  Family History  Problem Relation Age of Onset  . Ulcers Father   . Diverticulitis Father   . Arthritis Father   . Hypertension Brother   . Obesity Brother   . Cancer Brother        throat  . Arthritis Mother   . Depression Mother        Anxiety  . Colon cancer Neg Hx   . Colon polyps Neg Hx   . Esophageal cancer Neg Hx   . Rectal cancer Neg Hx   . Stomach cancer Neg Hx   . Kidney disease Neg Hx   . Kidney cancer Neg Hx   . Prostate cancer Neg Hx     Social History   Tobacco Use  . Smoking status: Never Smoker  . Smokeless tobacco: Never Used  Substance  Use Topics  . Alcohol use: No     Current Outpatient Medications:  .  acyclovir ointment (ZOVIRAX) 5 %, Apply topically every 3 (three) hours. As needed for cold sores, Disp: 30 g, Rfl: 3 .  amLODipine (NORVASC) 5 MG tablet, Take 1 tablet (5 mg total) by mouth daily., Disp: 90 tablet, Rfl: 1 .  aspirin 81 MG tablet, Take 81 mg by mouth daily., Disp: , Rfl:  .  colchicine 0.6 MG tablet, TAKE 1 TABLET (0.6 MG TOTAL) BY MOUTH DAILY AS NEEDED (WHEN HAVING A GOUT FLARE UP)., Disp: 30 tablet, Rfl: 3 .  metoprolol succinate (TOPROL-XL) 50 MG 24 hr tablet, TAKE 1 TABLET BY MOUTH DAILY., Disp: 90 tablet, Rfl: 1 .  mirabegron ER (MYRBETRIQ) 25 MG TB24 tablet, Take 1 tablet (25 mg total) by mouth daily., Disp: 30 tablet, Rfl: 11 .  Misc Natural Products (TART CHERRY ADVANCED) CAPS, Take 425 capsules by mouth daily., Disp: , Rfl:  .  Multiple Vitamins-Minerals (CVS SPECTRAVITE SENIOR) TABS, Take by mouth. 1 by mouth every am , Disp: , Rfl:  .  Omega-3 Fatty Acids (THERAGRAN-M FISH OIL CONC) 1200 MG CAPS, Take by mouth daily.  , Disp: , Rfl:  .  pantoprazole (PROTONIX) 40 MG tablet, TAKE 1 TABLET BY MOUTH TWICE A DAY, Disp: 180 tablet, Rfl: 3  Current Facility-Administered Medications:  .  0.9 %  sodium chloride infusion, 500 mL, Intravenous, Once, Armbruster, Carlota Raspberry, MD  EXAM:  GENERAL: alert, oriented, appears well and in no acute distress  HEENT: atraumatic, conjunttiva clear, no obvious abnormalities on inspection of external nose and ears  NECK: normal movements of the head and neck  LUNGS: on inspection no signs of respiratory distress, breathing rate appears normal, no obvious gross SOB, gasping or wheezing  CV: no obvious cyanosis  MS: moves all visible extremities without noticeable abnormality. Has no issue bending left elbow, raising left arm above head or moving wrist/fingers.  SKIN: Redness of left elbow seen via video feed.  Left elbow does not appear obviously swollen.    PSYCH/NEURO: pleasant and cooperative, no obvious depression or anxiety, speech and thought processing grossly intact  ASSESSMENT AND PLAN:  Discussed the following assessment and plan:  Cellulitis of left elbow - Plan: doxycycline (VIBRA-TABS) 100 MG tablet  I do suspect cellulitis of left elbow.  He will take doxycycline twice daily for 10 days and also will wrap elbow with either an Ace wrap or a sleeve to give some compression to elbow joint to help reduce any pain and/or swelling.  Possibility of olecranon bursitis/gouty arthritis of the elbow.  If doxycycline does not treat the redness and swelling,  then patient will need to come into office for further evaluation and further testing.    I discussed the assessment and treatment plan with the patient. The patient was provided an opportunity to ask questions and all were answered. The patient agreed with the plan and demonstrated an understanding of the instructions.   The patient was advised to call back or seek an in-person evaluation if the symptoms worsen or if the condition fails to improve as anticipated.  Advised patient that if elbow is not improving in the next 1 to 2 weeks to call office and let us know we will proceed with in person visit and/or referral to orthopedics at that time.  Jodelle Green, FNP

## 2018-06-30 ENCOUNTER — Encounter: Payer: Self-pay | Admitting: Family Medicine

## 2018-07-09 ENCOUNTER — Encounter: Payer: Self-pay | Admitting: Urology

## 2018-07-17 ENCOUNTER — Other Ambulatory Visit: Payer: Medicare Other

## 2018-07-17 ENCOUNTER — Other Ambulatory Visit: Payer: Self-pay

## 2018-07-17 DIAGNOSIS — C61 Malignant neoplasm of prostate: Secondary | ICD-10-CM

## 2018-07-18 LAB — PSA: Prostate Specific Ag, Serum: 4.6 ng/mL — ABNORMAL HIGH (ref 0.0–4.0)

## 2018-07-21 ENCOUNTER — Encounter: Payer: Self-pay | Admitting: Family Medicine

## 2018-07-23 ENCOUNTER — Other Ambulatory Visit: Payer: Self-pay

## 2018-07-23 ENCOUNTER — Ambulatory Visit: Payer: Medicare Other | Admitting: Family Medicine

## 2018-07-23 ENCOUNTER — Ambulatory Visit: Payer: Medicare Other | Admitting: Urology

## 2018-07-23 ENCOUNTER — Encounter: Payer: Self-pay | Admitting: Urology

## 2018-07-23 VITALS — BP 164/75 | HR 71 | Ht 69.0 in | Wt 247.0 lb

## 2018-07-23 DIAGNOSIS — N401 Enlarged prostate with lower urinary tract symptoms: Secondary | ICD-10-CM

## 2018-07-23 DIAGNOSIS — C61 Malignant neoplasm of prostate: Secondary | ICD-10-CM | POA: Diagnosis not present

## 2018-07-23 DIAGNOSIS — R350 Frequency of micturition: Secondary | ICD-10-CM

## 2018-07-23 DIAGNOSIS — R310 Gross hematuria: Secondary | ICD-10-CM

## 2018-07-23 DIAGNOSIS — N138 Other obstructive and reflux uropathy: Secondary | ICD-10-CM

## 2018-07-23 LAB — BLADDER SCAN AMB NON-IMAGING

## 2018-07-23 NOTE — Progress Notes (Signed)
07/23/2018 10:40 AM   [name] 1940-03-13 [id number]  Referring provider: Leone Haven, MD [address] [place], [place] [address]  Chief Complaint  Patient presents with  . Benign Prostatic Hypertrophy    HPI: 89 male with a personal history of BPH status post holep an incidental prostate cancer who returns today for routine follow-up.  He was last seen 6 months ago.  In terms of his urinary symptoms, these are dramatically improved now status post holep/cystolitholapaxy on 10/2017.  He is very satisfied with his voiding, good stream, able to empty well.  He is not on any BPH medications at this time.  He does have some daytime frequency voiding every 2 hours but has significantly less urgency.  He has nocturia x2 but attributes this to restlessness.  He is still taking Myrbetriq 25 mg for his daytime frequency which he has found effective without side effects.  No leakage not wearing a pad.    He does also have a personal history of elevated PSA status post previous negative biopsy but found to have incidental prostate cancer at the time of outlet surgery.The surgical pathology showed 6 7 g of resected tissue, with incidental finding of Gleason 3+4, grade group 2 and less than 5% of the tissue. There was also acute and chronic inflammation.   He also has a personal history of elevated PSA status post negative biopsy in 2017.His PSA remains stably elevated, most recently 6.03-2017.Preop prostate volume 120 cc.  New baseline PSA is 3.4 ng/mL from 01/17/18 following surgery.  Most recent  PSA 5/14 4.6, slightly up from new baseline.     He does describe an episodes of painless self limited gross hematuria a few months ago after doing an extreme heavy lifting.  This has not recurred.  It was not associated with any dysuria fevers or chills.   IPSS    Row Name 07/23/18 1000         International Prostate Symptom Score   How often have you had the sensation of  not emptying your bladder?  Less than 1 in 5     How often have you had to urinate less than every two hours?  Almost always     How often have you found you stopped and started again several times when you urinated?  About half the time     How often have you found it difficult to postpone urination?  About half the time     How often have you had a weak urinary stream?  Not at All     How often have you had to strain to start urination?  Not at All     How many times did you typically get up at night to urinate?  2 Times     Total IPSS Score  14       Quality of Life due to urinary symptoms   If you were to spend the rest of your life with your urinary condition just the way it is now how would you feel about that?  Pleased        Score:  1-7 Mild 8-19 Moderate 20-35 Severe   PMH: Past Medical History:  Diagnosis Date  . Allergy   . Anxiety   . Arthritis   . Cataract    beginning stage bilateral  . Elevated PSA   . GERD (gastroesophageal reflux disease)   . Gout   . History of kidney stones   .  Hyperlipidemia   . Hypertension   . Inguinal hernia     Surgical History: Past Surgical History:  Procedure Laterality Date  . BACK SURGERY  1980's   L4/5  . CARPAL TUNNEL RELEASE Right 08/15/2016   Procedure: CARPAL TUNNEL RELEASE ENDOSCOPIC;  Surgeon: Corky Mull, MD;  Location: [facility];  Service: Orthopedics;  Laterality: Right;  . COLONOSCOPY    . CYSTOSCOPY WITH LITHOLAPAXY N/A 10/09/2017   Procedure: CYSTOSCOPY WITH LITHOLAPAXY;  Surgeon: Hollice Espy, MD;  Location: [facility];  Service: Urology;  Laterality: N/A;  . ESOPHAGOGASTRODUODENOSCOPY  11-21-06   Esophagitis, Stricture/dilated   . HOLEP-LASER ENUCLEATION OF THE PROSTATE WITH MORCELLATION N/A 10/09/2017   Procedure: HOLEP-LASER ENUCLEATION OF THE PROSTATE WITH MORCELLATION;  Surgeon: Hollice Espy, MD;  Location: [facility];  Service: Urology;  Laterality: N/A;  . LYMPH NODE BIOPSY  1957   benign   lymph node removed  . Right arm fracture    . TONSILLECTOMY    . TOTAL KNEE ARTHROPLASTY  09-02-02   Bilateral  . UPPER GASTROINTESTINAL ENDOSCOPY      Home Medications:  Allergies as of 07/23/2018      Reactions   Penicillins Rash   Acetaminophen Itching   Aspirin Itching   OK w/low dose aspirin       Medication List       Accurate as of Jul 23, 2018 11:59 PM. If you have any questions, ask your nurse or doctor.        STOP taking these medications   doxycycline 100 MG tablet Commonly known as:  VIBRA-TABS Stopped by:  Hollice Espy, MD   Theragran-M Fish Oil Conc 1200 MG Caps Stopped by:  Hollice Espy, MD     TAKE these medications   acyclovir ointment 5 % Commonly known as:  ZOVIRAX Apply topically every 3 (three) hours. As needed for cold sores   amLODipine 5 MG tablet Commonly known as:  NORVASC Take 1 tablet (5 mg total) by mouth daily.   aspirin 81 MG tablet Take 81 mg by mouth daily.   colchicine 0.6 MG tablet TAKE 1 TABLET (0.6 MG TOTAL) BY MOUTH DAILY AS NEEDED (WHEN HAVING A GOUT FLARE UP).   [employer] Senior Tabs Take by mouth. 1 by mouth every am   metoprolol succinate 50 MG 24 hr tablet Commonly known as:  TOPROL-XL TAKE 1 TABLET BY MOUTH DAILY.   mirabegron ER 25 MG Tb24 tablet Commonly known as:  MYRBETRIQ Take 1 tablet (25 mg total) by mouth daily.   pantoprazole 40 MG tablet Commonly known as:  PROTONIX TAKE 1 TABLET BY MOUTH TWICE A DAY   Tart Cherry Advanced Caps Take 425 capsules by mouth daily.       Allergies:  Allergies  Allergen Reactions  . Penicillins Rash  . Acetaminophen Itching  . Aspirin Itching    OK w/low dose aspirin     Family History: Family History  Problem Relation Age of Onset  . Ulcers Father   . Diverticulitis Father   . Arthritis Father   . Hypertension Brother   . Obesity Brother   . Cancer Brother        throat  . Arthritis Mother   . Depression Mother        Anxiety  . Colon  cancer Neg Hx   . Colon polyps Neg Hx   . Esophageal cancer Neg Hx   . Rectal cancer Neg Hx   . Stomach cancer Neg Hx   .  Kidney disease Neg Hx   . Kidney cancer Neg Hx   . Prostate cancer Neg Hx     Social History:  reports that he has never smoked. He has never used smokeless tobacco. He reports that he does not drink alcohol or use drugs.  ROS: UROLOGY Frequent Urination?: Yes Hard to postpone urination?: No Burning/pain with urination?: No Get up at night to urinate?: Yes Leakage of urine?: No Urine stream starts and stops?: No Trouble starting stream?: No Do you have to strain to urinate?: No Blood in urine?: No Urinary tract infection?: No Sexually transmitted disease?: No Injury to kidneys or bladder?: No Painful intercourse?: No Weak stream?: No Erection problems?: No Penile pain?: No  Gastrointestinal Nausea?: No Vomiting?: No Indigestion/heartburn?: No Constipation?: No  Constitutional Fever: No Night sweats?: No Weight loss?: No Fatigue?: No  Skin Skin rash/lesions?: No Itching?: No  Eyes Blurred vision?: No Double vision?: No  Ears/Nose/Throat Sore throat?: No Sinus problems?: No  Hematologic/Lymphatic Swollen glands?: No Easy bruising?: No  Cardiovascular Leg swelling?: No Chest pain?: No  Respiratory Cough?: No Shortness of breath?: No  Endocrine Excessive thirst?: No  Musculoskeletal Back pain?: No Joint pain?: No  Neurological Headaches?: No Dizziness?: No  Psychologic Depression?: No Anxiety?: No  Physical Exam: BP (!) 164/75   Pulse 71   Ht 5\' 9"  (1.753 m)   Wt 247 lb (112 kg)   BMI 36.48 kg/m   Constitutional:  Alert and oriented, No acute distress. HEENT: [place] AT, moist mucus membranes.  Trachea midline, no masses. Cardiovascular: No clubbing, cyanosis, or edema. Respiratory: Normal respiratory effort, no increased work of breathing. GI: Abdomen is soft, nontender, nondistended, no abdominal masses GU: No  CVA tenderness Skin: No rashes, bruises or suspicious lesions. Neurologic: Grossly intact, no focal deficits, moving all 4 extremities. Psychiatric: Normal mood and affect.  Laboratory Data: Lab Results  Component Value Date   WBC 7.4 10/04/2017   HGB 14.4 10/04/2017   HCT 41.2 10/04/2017   MCV 88.5 10/04/2017   PLT 181 10/04/2017    Lab Results  Component Value Date   CREATININE 1.33 (H) 10/04/2017    Lab Results  Component Value Date   PSA 5.45 (H) 07/20/2015   PSA 6.58 (H) 07/16/2014   PSA 7.50 (H) 06/30/2013     Lab Results  Component Value Date   HGBA1C 5.6 06/26/2006    Pertinent Imaging: Results for orders placed or performed in visit on 07/23/18  Bladder Scan (Post Void Residual) in office  Result Value Ref Range   Scan Result 21ml     Assessment & Plan:    1. BPH with obstruction/lower urinary tract symptoms Minimal obstructive symptoms PVR minimal - Bladder Scan (Post Void Residual) in office - [place]; Future  2. Frequent urination Reviewed behavioral modification Continue mybetriq 25 mg  3. Gross hematuria In light of recent prostate surgery and episode exacerbation with extreme physical activity, suspect this is related to friable prostate Cystoscopy at the time of holep unremarkable for any bladder tumor additional underlying bladder pathology Advised to let us know if he has recurrent episode for consideration of cystoscopy  4. Prostate cancer ([place]) Low volume intermediate prostate cancer on active surveillance In light of slightly rising PSA, recommend either prostate biopsy, prostate MRI with consideration of fusion biopsy if his lesion is identified versus consideration of treatment of his prostate cancer  He is most interested in prostate MRI.  He has no contraindications for this.  We will call him with these results and arrange for subsequent follow-up if needed, otherwise follow-up in 6 months with PSA/DRE  He is  agreeable this plan   Return in about 6 months (around 01/23/2019) for IPSS, PSA (will call with MRI results).  Hollice Espy, MD  [facility] [address], [place], [place] [address] [phone]

## 2018-07-31 ENCOUNTER — Encounter: Payer: Self-pay | Admitting: Urology

## 2018-08-16 ENCOUNTER — Encounter: Payer: Self-pay | Admitting: Family Medicine

## 2018-08-18 ENCOUNTER — Other Ambulatory Visit: Payer: Self-pay

## 2018-08-18 ENCOUNTER — Ambulatory Visit
Admission: RE | Admit: 2018-08-18 | Discharge: 2018-08-18 | Disposition: A | Discharge status: Home / Self Care | Payer: Medicare Other | Source: Ambulatory Visit | Attending: Urology | Admitting: Urology

## 2018-08-18 DIAGNOSIS — N401 Enlarged prostate with lower urinary tract symptoms: Secondary | ICD-10-CM | POA: Diagnosis present

## 2018-08-18 DIAGNOSIS — N138 Other obstructive and reflux uropathy: Secondary | ICD-10-CM

## 2018-08-18 LAB — POCT I-STAT CREATININE: Creatinine, Ser: 1.2 mg/dL (ref 0.61–1.24)

## 2018-08-18 MED ORDER — METOPROLOL SUCCINATE ER 50 MG PO TB24: ORAL_TABLET | ORAL | 1 refills | Status: DC

## 2018-08-18 MED ORDER — GADOBUTROL 1 MMOL/ML IV SOLN
10.0000 mL | Freq: Once | INTRAVENOUS | Status: AC | PRN
  Administered 2018-08-18: 10 mL via INTRAVENOUS

## 2018-09-01 ENCOUNTER — Telehealth: Payer: Self-pay | Admitting: *Deleted

## 2018-09-01 NOTE — Telephone Encounter (Addendum)
Patient informed-verbalized understanding. Aware of future appointment.   ----- Message from Hollice Espy, MD sent at 09/01/2018  3:41 PM EDT ----- MRI reviewed.  There are no concerning lesions on this study..  We will continue to trend your PSA.  Hollice Espy, MD

## 2018-09-21 ENCOUNTER — Other Ambulatory Visit: Payer: Self-pay | Admitting: Family Medicine

## 2018-10-10 ENCOUNTER — Ambulatory Visit (INDEPENDENT_AMBULATORY_CARE_PROVIDER_SITE_OTHER): Payer: Medicare Other | Admitting: Family Medicine

## 2018-10-10 ENCOUNTER — Other Ambulatory Visit: Payer: Self-pay

## 2018-10-10 ENCOUNTER — Telehealth: Payer: Self-pay

## 2018-10-10 ENCOUNTER — Encounter: Payer: Self-pay | Admitting: Family Medicine

## 2018-10-10 ENCOUNTER — Ambulatory Visit (INDEPENDENT_AMBULATORY_CARE_PROVIDER_SITE_OTHER): Payer: Medicare Other

## 2018-10-10 DIAGNOSIS — K219 Gastro-esophageal reflux disease without esophagitis: Secondary | ICD-10-CM

## 2018-10-10 DIAGNOSIS — Z0000 Encounter for general adult medical examination without abnormal findings: Secondary | ICD-10-CM | POA: Diagnosis not present

## 2018-10-10 DIAGNOSIS — I10 Essential (primary) hypertension: Secondary | ICD-10-CM

## 2018-10-10 DIAGNOSIS — C61 Malignant neoplasm of prostate: Secondary | ICD-10-CM | POA: Diagnosis not present

## 2018-10-10 DIAGNOSIS — R310 Gross hematuria: Secondary | ICD-10-CM

## 2018-10-10 NOTE — Patient Instructions (Addendum)
  [name] , Thank you for taking time to come for your Medicare Wellness Visit. I appreciate your ongoing commitment to your health goals. Please review the following plan we discussed and let me know if I can assist you in the future.   These are the goals we discussed: Goals    . Follow up with Provider as scheduled       This is a list of the screening recommended for you and due dates:  Health Maintenance  Topic Date Due  . Flu Shot  10/04/2018  . Tetanus Vaccine  08/12/2024  . Pneumonia vaccines  Completed

## 2018-10-10 NOTE — Telephone Encounter (Signed)
LM TO CB TO CONFIRM APP  SENT [place]

## 2018-10-10 NOTE — Progress Notes (Signed)
Subjective:   [name] is a 78 y.o. male who presents for Medicare Annual/Subsequent preventive examination.  Review of Systems:  No ROS.  Medicare Wellness Virtual Visit.  Visual/audio telehealth visit, UTA vital signs.   See social history for additional risk factors.   Cardiac Risk Factors include: advanced age (>98men, >37 women);male gender;hypertension     Objective:    Vitals: There were no vitals taken for this visit.  There is no height or weight on file to calculate BMI.  Advanced Directives 10/10/2018 10/09/2017 10/07/2017 10/04/2017 04/02/2017 10/15/2016 08/15/2016  Does Patient Have a Medical Advance Directive? No No No No No No No  Would patient like information on creating a medical advance directive? No - Patient declined No - Patient declined No - Patient declined No - Patient declined - No - Patient declined No - Patient declined    Tobacco Social History   Tobacco Use  Smoking Status Never Smoker  Smokeless Tobacco Never Used     Counseling given: Not Answered   Clinical Intake:  Pre-visit preparation completed: Yes        Diabetes: No  How often do you need to have someone help you when you read instructions, pamphlets, or other written materials from your doctor or pharmacy?: 1 - Never  Interpreter Needed?: No     Past Medical History:  Diagnosis Date   Allergy    Anxiety    Arthritis    Cataract    beginning stage bilateral   Elevated PSA    GERD (gastroesophageal reflux disease)    Gout    History of kidney stones    Hyperlipidemia    Hypertension    Inguinal hernia    Past Surgical History:  Procedure Laterality Date   BACK SURGERY  1980's   L4/5   CARPAL TUNNEL RELEASE Right 08/15/2016   Procedure: CARPAL TUNNEL RELEASE ENDOSCOPIC;  Surgeon: Corky Mull, MD;  Location: [facility];  Service: Orthopedics;  Laterality: Right;   COLONOSCOPY     CYSTOSCOPY WITH LITHOLAPAXY N/A 10/09/2017   Procedure:  CYSTOSCOPY WITH LITHOLAPAXY;  Surgeon: Hollice Espy, MD;  Location: [facility];  Service: Urology;  Laterality: N/A;   ESOPHAGOGASTRODUODENOSCOPY  11-21-06   Esophagitis, Stricture/dilated    HOLEP-LASER ENUCLEATION OF THE PROSTATE WITH MORCELLATION N/A 10/09/2017   Procedure: HOLEP-LASER ENUCLEATION OF THE PROSTATE WITH MORCELLATION;  Surgeon: Hollice Espy, MD;  Location: [facility];  Service: Urology;  Laterality: N/A;   LYMPH NODE BIOPSY  1957   benign  lymph node removed   Right arm fracture     TONSILLECTOMY     TOTAL KNEE ARTHROPLASTY  09-02-02   Bilateral   UPPER GASTROINTESTINAL ENDOSCOPY     Family History  Problem Relation Age of Onset   Ulcers Father    Diverticulitis Father    Arthritis Father    Hypertension Brother    Obesity Brother    Cancer Brother        throat   Arthritis Mother    Depression Mother        Anxiety   Colon cancer Neg Hx    Colon polyps Neg Hx    Esophageal cancer Neg Hx    Rectal cancer Neg Hx    Stomach cancer Neg Hx    Kidney disease Neg Hx    Kidney cancer Neg Hx    Prostate cancer Neg Hx    Social History   Socioeconomic History   Marital status: Married  Spouse name: Not on file   Number of children: 1   Years of education: Not on file   Highest education level: Not on file  Occupational History   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    Employer: [employer]: Proofreader strain: Not hard at all   Food insecurity    Worry: Never true    Inability: Never true   Transportation needs    Medical: No    Non-medical: No  Tobacco Use   Smoking status: Never Smoker   Smokeless tobacco: Never Used  Substance and Sexual Activity   Alcohol use: No   Drug use: No   Sexual activity: Yes  Lifestyle   Physical activity    Days per week: 5 days    Minutes per session: 60 min   Stress: Not at all  Relationships   Social connections    Talks on phone:  Not on file    Gets together: Not on file    Attends religious service: Not on file    Active member of club or organization: Not on file    Attends meetings of clubs or organizations: Not on file    Relationship status: Not on file  Other Topics Concern   Not on file  Social History Narrative   Lives in [place] with wife. From [place], [place]. 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 Daughter, [name], lives in [place]. No pets.      Exercise: regular   Diet: increased sweets    Outpatient Encounter Medications as of 10/10/2018  Medication Sig   acyclovir ointment (ZOVIRAX) 5 % Apply topically every 3 (three) hours. As needed for cold sores   amLODipine (NORVASC) 5 MG tablet Take 1 tablet (5 mg total) by mouth daily.   aspirin 81 MG tablet Take 81 mg by mouth daily.   colchicine 0.6 MG tablet TAKE 1 TABLET (0.6 MG TOTAL) BY MOUTH DAILY AS NEEDED (WHEN HAVING A GOUT FLARE UP).   metoprolol succinate (TOPROL-XL) 50 MG 24 hr tablet TAKE 1 TABLET BY MOUTH DAILY.   mirabegron ER (MYRBETRIQ) 25 MG TB24 tablet Take 1 tablet (25 mg total) by mouth daily.   Misc Natural Products (TART CHERRY ADVANCED) CAPS Take 425 capsules by mouth daily.   Multiple Vitamins-Minerals (CVS SPECTRAVITE SENIOR) TABS Take by mouth. 1 by mouth every am    pantoprazole (PROTONIX) 40 MG tablet TAKE 1 TABLET BY MOUTH TWICE A DAY   Facility-Administered Encounter Medications as of 10/10/2018  Medication   0.9 %  sodium chloride infusion    Activities of Daily Living In your present state of health, do you have any difficulty performing the following activities: 10/10/2018  Hearing? N  Vision? N  Difficulty concentrating or making decisions? N  Walking or climbing stairs? N  Dressing or bathing? N  Doing errands, shopping? N  Preparing Food and eating ? N  Using the Toilet? N  In the past six months, have you accidently leaked urine? N  Do you have problems with loss of bowel control? N    Managing your Medications? N  Managing your Finances? N  Housekeeping or managing your Housekeeping? N  Some recent data might be hidden    Patient Care Team: Leone Haven, MD as PCP - General (Family Medicine) Dasher, Rayvon Char, MD (Dermatology) Birder Robson, MD as Referring Physician (Ophthalmology) Gaynelle Arabian, MD (Orthopedic Surgery)   Assessment:  This is a routine wellness examination for [facility].  I connected with patient 10/10/18 at 10:30 AM EDT by an audio enabled telemedicine application and verified that I am speaking with the correct person using two identifiers. Patient stated full name and DOB. Patient gave permission to continue with virtual visit. Patient's location was at home and Nurse's location was at [place] office.   Health Screenings  Colonoscopy - 03/2017 NM Bone Scan 3 - 03/2018  Glaucoma -none PSA- 07/2018 (4.6) Hearing -demonstrates normal hearing during visit. Labs followed by pcp Dental- visits every 6 months Vision- visits within the last 12 months. Wears glasses.  Social  Alcohol intake - no        Smoking history- never    Smokers in home? none Illicit drug use? none Physical activity- works at [employer] 38 hours per week (lifting, bending, climbing) Diet - regular Sexually Active -not currently BMI- discussed the importance of a healthy diet, water intake and the benefits of aerobic exercise.  Educational material provided.   Safety  Patient feels safe at home- yes Patient does have smoke detectors at home- yes Patient does wear sunscreen or protective clothing when in direct sunlight -yes Patient does wear seat belt when in a moving vehicle -yes Patient drives- yes  EFEOF-12 precautions and sickness symptoms discussed.   Activities of Daily Living Patient denies needing assistance with: driving, household chores, feeding themselves, getting from bed to chair, getting to the toilet, bathing/showering, dressing, managing money, or  preparing meals.  No new identified risk were noted.    Depression Screen Patient denies losing interest in daily life, feeling hopeless, or crying easily over simple problems.   Medication-taking as directed and without issues.   Fall Screen Patient denies being afraid of falling or falling in the last year.   Memory Screen Patient is alert.  Patient denies difficulty focusing, concentrating or misplacing items. Correctly identified the president of the [place], season and recall. Patient likes to read 5-10 books per week, completes jigsaw and crossword puzzles for brain stimulation.  Immunizations The following Immunizations were discussed: Influenza, shingles, pneumonia, and tetanus.   Other Providers Patient Care Team: Leone Haven, MD as PCP - General (Family Medicine) Dasher, Rayvon Char, MD (Dermatology) Birder Robson, MD as Referring Physician (Ophthalmology) Gaynelle Arabian, MD (Orthopedic Surgery)   Exercise Activities and Dietary recommendations Current Exercise Habits: The patient has a physically strenuous job, but has no regular exercise apart from work., Type of exercise: walking;stretching, Frequency (Times/Week): 5, Intensity: Mild  Goals     Follow up with Provider as scheduled       Fall Risk Fall Risk  10/10/2018 10/07/2017 10/15/2016 10/15/2016 10/12/2015  Falls in the past year? 0 No No No No   Is the patient's home free of loose throw rugs in walkways, pet beds, electrical cords, etc?  yes      Grab bars in the bathroom? yes      Handrails on the stairs?  yes      Adequate lighting?  yes  Depression Screen PHQ 2/9 Scores 10/10/2018 10/07/2017 10/15/2016 10/12/2015  PHQ - 2 Score 0 0 0 0  PHQ- 9 Score - - 0 -    Cognitive Function MMSE - Mini Mental State Exam 10/07/2017 10/15/2016 10/12/2015  Orientation to time 5 5 5   Orientation to Place 5 5 5   Registration 3 3 3   Attention/ Calculation 5 5 5   Recall 3 3 3   Language- name 2 objects 2 2 2  Language-  repeat 1 1 1   Language- follow 3 step command 3 3 3   Language- read & follow direction 1 1 1   Write a sentence 1 1 1   Copy design 1 1 1   Total score 30 30 30      6CIT Screen 10/10/2018  What Year? 0 points  What month? 0 points  What time? 0 points  Count back from 20 0 points  Months in reverse 0 points  Repeat phrase 0 points  Total Score 0    Immunization History  Administered Date(s) Administered   Influenza Whole 01/03/2006, 12/24/2011   Influenza, High Dose Seasonal PF 12/03/2017   Influenza,inj,Quad PF,6+ Mos 12/15/2012, 10/28/2014   Influenza-Unspecified 12/25/2013, 11/08/2015, 10/20/2016   Pneumococcal Conjugate-13 06/30/2013   Pneumococcal Polysaccharide-23 06/21/2006   Td 08/19/2002   Tdap 08/13/2014   Zoster 12/28/2009   Screening Tests Health Maintenance  Topic Date Due   INFLUENZA VACCINE  10/04/2018   TETANUS/TDAP  08/12/2024   PNA vac Low Risk Adult  Completed       Plan:   End of life planning; Advanced aging; Advanced directives discussed.  No HCPOA/Living Will.  Additional information declined at this time.  I have personally reviewed and noted the following in the patients chart:    Medical and social history  Use of alcohol, tobacco or illicit drugs   Current medications and supplements  Functional ability and status  Nutritional status  Physical activity  Advanced directives  List of other physicians  Hospitalizations, surgeries, and ER visits in previous 12 months  Vitals  Screenings to include cognitive, depression, and falls  Referrals and appointments  In addition, I have reviewed and discussed with patient certain preventive protocols, quality metrics, and best practice recommendations. A written personalized care plan for preventive services as well as general preventive health recommendations were provided to patient.     Varney Biles, LPN  04/13/9240

## 2018-10-10 NOTE — Assessment & Plan Note (Signed)
He continues to follow with urology.

## 2018-10-10 NOTE — Assessment & Plan Note (Signed)
Adequately controlled for age.  Continue current medications.  Will come in for lab work.

## 2018-10-10 NOTE — Telephone Encounter (Signed)
-----   Message from Hollice Espy, MD sent at 10/10/2018  1:52 PM EDT ----- Thank you,  I will have my staff reach out to him.  To my staff: can you please call and schedule cysto?  Hollice Espy, MD ----- Message ----- From: Duane Haven, MD Sent: 10/10/2018   1:17 PM EDT To: Hollice Espy, MD  Hi Dr Erlene Quan,   I saw [name] today for follow-up. He noted having gross hematuria about once a week since her saw you in May. It occurs after he has lifted something heavy. It looks like you discussed this with him at that visit and noted that he may need a cystoscopy if it was recurrent. I advised him to contact your office regarding this though wanted to make you aware that he continues to have issues with this. Please let me know if you have any questions.   Tommi Rumps

## 2018-10-10 NOTE — Assessment & Plan Note (Signed)
He has discussed this previously with urology though given the recurrent episodes I encouraged him to contact his urologist to let them know to see if they would like to do a cystoscopy.  I will send my note to his urologist as well.

## 2018-10-10 NOTE — Progress Notes (Signed)
Virtual Visit via telephone Note  This visit type was conducted due to national recommendations for restrictions regarding the COVID-19 pandemic (e.g. social distancing).  This format is felt to be most appropriate for this patient at this time.  All issues noted in this document were discussed and addressed.  No physical exam was performed (except for noted visual exam findings with Video Visits).   I connected with [name] today at 11:00 AM EDT by telephone and verified that I am speaking with the correct person using two identifiers. Location patient: home Location provider: work Persons participating in the virtual visit: patient, provider, wife  I discussed the limitations, risks, security and privacy concerns of performing an evaluation and management service by telephone and the availability of in person appointments. I also discussed with the patient that there may be a patient responsible charge related to this service. The patient expressed understanding and agreed to proceed.  Interactive audio and video telecommunications were attempted between this provider and patient, however failed, due to patient having technical difficulties OR patient did not have access to video capability.  We continued and completed visit with audio only.  Reason for visit: follow-up  HPI: Prostate cancer: Patient underwent procedure revealing low-volume prostate cancer.  His PSA trended up and he had an MRI which was overall reassuring.  He does note about once a week he will have some blood in his urine when he does heavy lifting though that will resolve after he sits down.  He did discuss that with urology back in May though has not discussed the recurrent episodes with them.  Hypertension: Typically running around 140/86.  Taking amlodipine and metoprolol.  No chest pain, shortness breath, or edema.  GERD: Taking Protonix twice daily.  He will occasionally miss a dose and not have any symptoms.   No reflux symptoms.  No abdominal pain, blood in stool, or dysphagia.   ROS: See pertinent positives and negatives per HPI.  Past Medical History:  Diagnosis Date  . Allergy   . Anxiety   . Arthritis   . Cataract    beginning stage bilateral  . Elevated PSA   . GERD (gastroesophageal reflux disease)   . Gout   . History of kidney stones   . Hyperlipidemia   . Hypertension   . Inguinal hernia     Past Surgical History:  Procedure Laterality Date  . BACK SURGERY  1980's   L4/5  . CARPAL TUNNEL RELEASE Right 08/15/2016   Procedure: CARPAL TUNNEL RELEASE ENDOSCOPIC;  Surgeon: Corky Mull, MD;  Location: [facility];  Service: Orthopedics;  Laterality: Right;  . COLONOSCOPY    . CYSTOSCOPY WITH LITHOLAPAXY N/A 10/09/2017   Procedure: CYSTOSCOPY WITH LITHOLAPAXY;  Surgeon: Hollice Espy, MD;  Location: [facility];  Service: Urology;  Laterality: N/A;  . ESOPHAGOGASTRODUODENOSCOPY  11-21-06   Esophagitis, Stricture/dilated   . HOLEP-LASER ENUCLEATION OF THE PROSTATE WITH MORCELLATION N/A 10/09/2017   Procedure: HOLEP-LASER ENUCLEATION OF THE PROSTATE WITH MORCELLATION;  Surgeon: Hollice Espy, MD;  Location: [facility];  Service: Urology;  Laterality: N/A;  . LYMPH NODE BIOPSY  1957   benign  lymph node removed  . Right arm fracture    . TONSILLECTOMY    . TOTAL KNEE ARTHROPLASTY  09-02-02   Bilateral  . UPPER GASTROINTESTINAL ENDOSCOPY      Family History  Problem Relation Age of Onset  . Ulcers Father   . Diverticulitis Father   . Arthritis Father   .  Hypertension Brother   . Obesity Brother   . Cancer Brother        throat  . Arthritis Mother   . Depression Mother        Anxiety  . Colon cancer Neg Hx   . Colon polyps Neg Hx   . Esophageal cancer Neg Hx   . Rectal cancer Neg Hx   . Stomach cancer Neg Hx   . Kidney disease Neg Hx   . Kidney cancer Neg Hx   . Prostate cancer Neg Hx     SOCIAL HX: Non-smoker   Current Outpatient Medications:  .   acyclovir ointment (ZOVIRAX) 5 %, Apply topically every 3 (three) hours. As needed for cold sores, Disp: 30 g, Rfl: 3 .  amLODipine (NORVASC) 5 MG tablet, Take 1 tablet (5 mg total) by mouth daily., Disp: 90 tablet, Rfl: 1 .  aspirin 81 MG tablet, Take 81 mg by mouth daily., Disp: , Rfl:  .  colchicine 0.6 MG tablet, TAKE 1 TABLET (0.6 MG TOTAL) BY MOUTH DAILY AS NEEDED (WHEN HAVING A GOUT FLARE UP)., Disp: 30 tablet, Rfl: 3 .  metoprolol succinate (TOPROL-XL) 50 MG 24 hr tablet, TAKE 1 TABLET BY MOUTH DAILY., Disp: 90 tablet, Rfl: 1 .  mirabegron ER (MYRBETRIQ) 25 MG TB24 tablet, Take 1 tablet (25 mg total) by mouth daily., Disp: 30 tablet, Rfl: 11 .  Misc Natural Products (TART CHERRY ADVANCED) CAPS, Take 425 capsules by mouth daily., Disp: , Rfl:  .  Multiple Vitamins-Minerals ([employer] SPECTRAVITE SENIOR) TABS, Take by mouth. 1 by mouth every am , Disp: , Rfl:  .  pantoprazole (PROTONIX) 40 MG tablet, TAKE 1 TABLET BY MOUTH TWICE A DAY, Disp: 180 tablet, Rfl: 3  Current Facility-Administered Medications:  .  0.9 %  sodium chloride infusion, 500 mL, Intravenous, Once, Armbruster, Carlota Raspberry, MD  EXAM: This is a telehealth telephone visit notes no physical exam was completed.  ASSESSMENT AND PLAN:  Discussed the following assessment and plan:  Essential hypertension Adequately controlled for age.  Continue current medications.  Will come in for lab work.  Prostate cancer [facility]) He continues to follow with urology.  Gross hematuria He has discussed this previously with urology though given the recurrent episodes I encouraged him to contact his urologist to let them know to see if they would like to do a cystoscopy.  I will send my note to his urologist as well.  GERD (gastroesophageal reflux disease) Well-controlled.  Discussed decreasing the Protonix to once daily to see if that adequately controls his symptoms.  Discussed risks of renal impairment, impaired absorption, and risk of fracture  with long-term use of PPIs.    Social distancing precautions and sick precautions given regarding COVID-19.  I discussed the assessment and treatment plan with the patient. The patient was provided an opportunity to ask questions and all were answered. The patient agreed with the plan and demonstrated an understanding of the instructions.   The patient was advised to call back or seek an in-person evaluation if the symptoms worsen or if the condition fails to improve as anticipated.  I provided 13 minutes of non-face-to-face time during this encounter.   Tommi Rumps, MD

## 2018-10-10 NOTE — Assessment & Plan Note (Signed)
Well-controlled.  Discussed decreasing the Protonix to once daily to see if that adequately controls his symptoms.  Discussed risks of renal impairment, impaired absorption, and risk of fracture with long-term use of PPIs.

## 2018-10-11 NOTE — Progress Notes (Signed)
I have reviewed the above note and agree.  Shayda Kalka, M.D.  

## 2018-10-13 ENCOUNTER — Encounter: Payer: Self-pay | Admitting: Urology

## 2018-10-17 ENCOUNTER — Other Ambulatory Visit: Payer: Self-pay | Admitting: Family Medicine

## 2018-10-17 DIAGNOSIS — I10 Essential (primary) hypertension: Secondary | ICD-10-CM

## 2018-10-19 ENCOUNTER — Encounter: Payer: Self-pay | Admitting: Urology

## 2018-10-20 ENCOUNTER — Encounter: Payer: Self-pay | Admitting: Family Medicine

## 2018-10-22 ENCOUNTER — Telehealth: Payer: Self-pay

## 2018-10-22 NOTE — Telephone Encounter (Signed)
I called and spoke with the wife of the patient to schedule a fasting lab appointment and she did not have the patients schedule, he was at work, she will call back and schedule.  Ashawna Hanback,cma

## 2018-11-04 ENCOUNTER — Other Ambulatory Visit (INDEPENDENT_AMBULATORY_CARE_PROVIDER_SITE_OTHER): Payer: Medicare Other

## 2018-11-04 ENCOUNTER — Other Ambulatory Visit: Payer: Self-pay

## 2018-11-04 ENCOUNTER — Other Ambulatory Visit: Payer: Self-pay | Admitting: Radiology

## 2018-11-04 ENCOUNTER — Telehealth: Payer: Self-pay | Admitting: Radiology

## 2018-11-04 ENCOUNTER — Ambulatory Visit (INDEPENDENT_AMBULATORY_CARE_PROVIDER_SITE_OTHER): Payer: Medicare Other | Admitting: Urology

## 2018-11-04 ENCOUNTER — Encounter: Payer: Self-pay | Admitting: Urology

## 2018-11-04 VITALS — BP 170/82 | HR 69 | Ht 72.0 in | Wt 250.0 lb

## 2018-11-04 DIAGNOSIS — N210 Calculus in bladder: Secondary | ICD-10-CM

## 2018-11-04 DIAGNOSIS — R310 Gross hematuria: Secondary | ICD-10-CM

## 2018-11-04 DIAGNOSIS — I10 Essential (primary) hypertension: Secondary | ICD-10-CM | POA: Diagnosis not present

## 2018-11-04 DIAGNOSIS — N35912 Unspecified bulbous urethral stricture, male: Secondary | ICD-10-CM

## 2018-11-04 LAB — COMPREHENSIVE METABOLIC PANEL
ALT: 21 U/L (ref 0–53)
AST: 25 U/L (ref 0–37)
Albumin: 4.1 g/dL (ref 3.5–5.2)
Alkaline Phosphatase: 75 U/L (ref 39–117)
BUN: 19 mg/dL (ref 6–23)
CO2: 30 mEq/L (ref 19–32)
Calcium: 9.3 mg/dL (ref 8.4–10.5)
Chloride: 102 mEq/L (ref 96–112)
Creatinine, Ser: 1.27 mg/dL (ref 0.40–1.50)
GFR: 54.69 mL/min — ABNORMAL LOW (ref >60.00)
Glucose, Bld: 97 mg/dL (ref 70–99)
Potassium: 4.3 mEq/L (ref 3.5–5.1)
Sodium: 137 mEq/L (ref 135–145)
Total Bilirubin: 0.7 mg/dL (ref 0.2–1.2)
Total Protein: 7.5 g/dL (ref 6.0–8.3)

## 2018-11-04 LAB — URINALYSIS, COMPLETE
Bilirubin, UA: NEGATIVE
Glucose, UA: NEGATIVE
Ketones, UA: NEGATIVE
Leukocytes,UA: NEGATIVE
Nitrite, UA: NEGATIVE
Specific Gravity, UA: 1.025 (ref 1.005–1.030)
Urobilinogen, Ur: 0.2 mg/dL (ref 0.2–1.0)
pH, UA: 5 (ref 5.0–7.5)

## 2018-11-04 LAB — LIPID PANEL
Cholesterol: 147 mg/dL (ref 0–200)
HDL: 34.1 mg/dL — ABNORMAL LOW (ref >39.00)
LDL Cholesterol: 92 mg/dL (ref 0–99)
NonHDL: 113.21
Total CHOL/HDL Ratio: 4
Triglycerides: 107 mg/dL (ref 0.0–149.0)
VLDL: 21.4 mg/dL (ref 0.0–40.0)

## 2018-11-04 LAB — MICROSCOPIC EXAMINATION

## 2018-11-04 NOTE — Telephone Encounter (Signed)
Discussed the [facility] Information form below over the phone with patient.    [address], [place], [place] [address] Telephone: [phone] Fax: [phone]   Thank you for choosing [facility] for your upcoming surgery!  We are always here to assist in your urological needs.  Please read the following information with specific details for your upcoming appointments related to your surgery. Please contact Judd Mccubbin at [phone] Option 3 with any questions.  The Name of Your Surgery: Urethral balloon dilation, cystolitholapaxy Your Surgery Date: 11/17/2018 Your Surgeon: Hollice Espy  Please call Same Day Surgery at [phone] between the hours of 1pm-3pm one day prior to your surgery. They will inform you of the time to arrive at Same Day Surgery which is located on the second floor of the [facility].   Please refer to the attached letter regarding instructions for Pre-Admission Testing. You will receive a call from the [place] office regarding your appointment with them.  The Pre-Admission Testing office is located at [place], on the first floor of the [facility] [facility] in [place] (office is to the right as you enter through the [employer] of the [facility]). Please have all medications you are currently taking and your insurance card available.  A COVID-19 test will be required prior to surgery and once test is performed you will need to remain in quarantine until the day of surgery. Patient was advised to have nothing to eat or drink after midnight the night prior to surgery except that he may have only water until 2 hours before surgery with nothing to drink within 2 hours of surgery.  The patient currently takes aspirin 81mg  & was informed to continue medication per Dr Erlene Quan. Patient's  questions were answered and he expressed understanding of these instructions.

## 2018-11-04 NOTE — H&P (View-Only) (Signed)
   11/04/18  CC:  Chief Complaint  Patient presents with  . Cysto    HPI: 78 year old male with a personal history of BPH, prostate cancer, history of bladder stones with recurrent episodes of painless gross hematuria exacerbated by physical activity.  He presents today for cystoscopic evaluation.  Notably today, he also mentions that he is had some burning at the tip of his penis with voiding for the past 2 weeks.  His UA is otherwise bland.  Please see previous note for past urologic history.  Blood pressure (!) 170/82, pulse 69, height 6' (1.829 m), weight 250 lb (113.4 kg). NED. A&Ox3.   No respiratory distress   Abd soft, NT, ND Normal phallus with bilateral descended testicles  Cystoscopy Procedure Note  Patient identification was confirmed, informed consent was obtained, and patient was prepped using Betadine solution.  Lidocaine jelly was administered per urethral meatus.     Pre-Procedure: - Inspection reveals a normal caliber ureteral meatus.  Procedure: The flexible cystoscope was introduced without difficulty -Approximately 14 French bulbar urethral stricture, wispy, non-dense able to be passed bluntly with cystoscope - Irregular prostate with large holep defect, adherent stone material to anterior surface of prostate and free-floating calcifications within the fossa - Bilateral ureteral orifices identified - Bladder mucosa  reveals no ulcers, tumors, or lesions - No bladder stones - No trabeculation  Retroflexion shows large holep defect with adherent stone material and free-floating calcifications within the bladder, all relatively small less than 1 cm   Post-Procedure: - Patient tolerated the procedure well  Assessment/ Plan:  1. Gross hematuria Likely related to stone material/dystrophic calcifications on the surface of irregular enucleated prostate bed  Dysuria and urinary symptoms likely related to these calcifications which do not appear to be  passable with the presence of a stricture.  Additionally, this may be contributing to his episodes of hematuria as above.  I recommended proceeding to the operating room for balloon dilation of his bulbar urethral stricture to open it more patently.  We discussed that as of a 50% chance of recurrence.  He will require a Foley catheter postoperatively for about 3 days.  Additionally, will be able to irrigate out all of these calcifications and dislodge the adherent dystrophic calcifications from the anterior surface of the prostate surgically in hopes to limit the recurrence.  This may or may not require laser.  Risk of the procedure were discussed in detail occluding risk of bleeding, infection, damage surrounding structures, recurrence of a stricture, need for further procedures, amongst others..  Need for Covid testing was also discussed.  All questions were answered.  He is willing to proceed as planned. - Urinalysis, Complete  2. Bladder calculi As above - CULTURE, URINE COMPREHENSIVE  3. Bulbous urethral stricture As above, likely secondary to previous instrumentation    Hollice Espy, MD

## 2018-11-04 NOTE — Progress Notes (Signed)
   11/04/18  CC:  Chief Complaint  Patient presents with  . Cysto    HPI: 78 year old male with a personal history of BPH, prostate cancer, history of bladder stones with recurrent episodes of painless gross hematuria exacerbated by physical activity.  He presents today for cystoscopic evaluation.  Notably today, he also mentions that he is had some burning at the tip of his penis with voiding for the past 2 weeks.  His UA is otherwise bland.  Please see previous note for past urologic history.  Blood pressure (!) 170/82, pulse 69, height 6' (1.829 m), weight 250 lb (113.4 kg). NED. A&Ox3.   No respiratory distress   Abd soft, NT, ND Normal phallus with bilateral descended testicles  Cystoscopy Procedure Note  Patient identification was confirmed, informed consent was obtained, and patient was prepped using Betadine solution.  Lidocaine jelly was administered per urethral meatus.     Pre-Procedure: - Inspection reveals a normal caliber ureteral meatus.  Procedure: The flexible cystoscope was introduced without difficulty -Approximately 14 French bulbar urethral stricture, wispy, non-dense able to be passed bluntly with cystoscope - Irregular prostate with large holep defect, adherent stone material to anterior surface of prostate and free-floating calcifications within the fossa - Bilateral ureteral orifices identified - Bladder mucosa  reveals no ulcers, tumors, or lesions - No bladder stones - No trabeculation  Retroflexion shows large holep defect with adherent stone material and free-floating calcifications within the bladder, all relatively small less than 1 cm   Post-Procedure: - Patient tolerated the procedure well  Assessment/ Plan:  1. Gross hematuria Likely related to stone material/dystrophic calcifications on the surface of irregular enucleated prostate bed  Dysuria and urinary symptoms likely related to these calcifications which do not appear to be  passable with the presence of a stricture.  Additionally, this may be contributing to his episodes of hematuria as above.  I recommended proceeding to the operating room for balloon dilation of his bulbar urethral stricture to open it more patently.  We discussed that as of a 50% chance of recurrence.  He will require a Foley catheter postoperatively for about 3 days.  Additionally, will be able to irrigate out all of these calcifications and dislodge the adherent dystrophic calcifications from the anterior surface of the prostate surgically in hopes to limit the recurrence.  This may or may not require laser.  Risk of the procedure were discussed in detail occluding risk of bleeding, infection, damage surrounding structures, recurrence of a stricture, need for further procedures, amongst others..  Need for Covid testing was also discussed.  All questions were answered.  He is willing to proceed as planned. - Urinalysis, Complete  2. Bladder calculi As above - CULTURE, URINE COMPREHENSIVE  3. Bulbous urethral stricture As above, likely secondary to previous instrumentation    Hollice Espy, MD

## 2018-11-06 LAB — CULTURE, URINE COMPREHENSIVE

## 2018-11-13 ENCOUNTER — Other Ambulatory Visit: Payer: Self-pay

## 2018-11-13 ENCOUNTER — Encounter
Admission: RE | Admit: 2018-11-13 | Discharge: 2018-11-13 | Disposition: A | Discharge status: Home / Self Care | Payer: Medicare Other | Source: Ambulatory Visit | Attending: Urology | Admitting: Urology

## 2018-11-13 DIAGNOSIS — Z01818 Encounter for other preprocedural examination: Secondary | ICD-10-CM | POA: Diagnosis not present

## 2018-11-13 DIAGNOSIS — Z20828 Contact with and (suspected) exposure to other viral communicable diseases: Secondary | ICD-10-CM | POA: Insufficient documentation

## 2018-11-13 LAB — CBC
HCT: 42.8 % (ref 39.0–52.0)
Hemoglobin: 14.1 g/dL (ref 13.0–17.0)
MCH: 29.6 pg (ref 26.0–34.0)
MCHC: 32.9 g/dL (ref 30.0–36.0)
MCV: 89.9 fL (ref 80.0–100.0)
Platelets: 212 10*3/uL (ref 150–400)
RBC: 4.76 MIL/uL (ref 4.22–5.81)
RDW: 13.4 % (ref 11.5–15.5)
WBC: 8.5 10*3/uL (ref 4.0–10.5)
nRBC: 0 % (ref 0.0–0.2)

## 2018-11-13 LAB — SARS CORONAVIRUS 2 (TAT 6-24 HRS): SARS Coronavirus 2: NEGATIVE

## 2018-11-13 NOTE — Patient Instructions (Signed)
Your procedure is scheduled on: 11/17/2018 Mon Report to Same Day Surgery 2nd floor medical mall [facility] Entrance-take elevator on left to 2nd floor.  Check in with surgery information desk.) To find out your arrival time please call [phone] between 1PM - 3PM on 11/14/2018 Fri  Remember: Instructions that are not followed completely may result in serious medical risk, up to and including death, or upon the discretion of your surgeon and anesthesiologist your surgery may need to be rescheduled.    _x___ 1. Do not eat food after midnight the night before your procedure. You may drink clear liquids up to 2 hours before you are scheduled to arrive at the hospital for your procedure.  Do not drink clear liquids within 2 hours of your scheduled arrival to the hospital.  Clear liquids include  --Water or Apple juice without pulp  --Clear carbohydrate beverage such as ClearFast or Gatorade  --Black Coffee or Clear Tea (No milk, no creamers, do not add anything to                  the coffee or Tea Type 1 and type 2 diabetics should only drink water.   ____Ensure clear carbohydrate drink on the way to the hospital for bariatric patients  ____Ensure clear carbohydrate drink 3 hours before surgery.   No gum chewing or hard candies.     __x__ 2. No Alcohol for 24 hours before or after surgery.   __x__3. No Smoking or e-cigarettes for 24 prior to surgery.  Do not use any chewable tobacco products for at least 6 hour prior to surgery   ____  4. Bring all medications with you on the day of surgery if instructed.    __x__ 5. Notify your doctor if there is any change in your medical condition     (cold, fever, infections).    x___6. On the morning of surgery brush your teeth with toothpaste and water.  You may rinse your mouth with mouth wash if you wish.  Do not swallow any toothpaste or mouthwash.   Do not wear jewelry, make-up, hairpins, clips or nail polish.  Do not wear lotions,  powders, or perfumes. You may wear deodorant.  Do not shave 48 hours prior to surgery. Men may shave face and neck.  Do not bring valuables to the hospital.    [facility] is not responsible for any belongings or valuables.               Contacts, dentures or bridgework may not be worn into surgery.  Leave your suitcase in the car. After surgery it may be brought to your room.  For patients admitted to the hospital, discharge time is determined by your                       treatment team.  _  Patients discharged the day of surgery will not be allowed to drive home.  You will need someone to drive you home and stay with you the night of your procedure.    Please read over the following fact sheets that you were given:   [facility] Preparing for Surgery and or MRSA Information   _x___ Take anti-hypertensive listed below, cardiac, seizure, asthma,     anti-reflux and psychiatric medicines. These include:  1. amLODipine (NORVASC) 5 MG tablet  2.metoprolol succinate (TOPROL-XL) 50 MG 24 hr tablet  3.pantoprazole (PROTONIX) 40 MG tablet  4.  5.  6.  ____Fleets enema or Magnesium Citrate as directed.   ____ Use CHG Soap or sage wipes as directed on instruction sheet   ____ Use inhalers on the day of surgery and bring to hospital day of surgery  ____ Stop Metformin and Janumet 2 days prior to surgery.    ____ Take 1/2 of usual insulin dose the night before surgery and none on the morning     surgery.   _x___ Follow recommendations from Cardiologist, Pulmonologist or PCP regarding          stopping Aspirin, Coumadin, Plavix ,Eliquis, Effient, or Pradaxa, and Pletal.  X____Stop Anti-inflammatories such as Advil, Aleve, Ibuprofen, Motrin, Naproxen, Naprosyn, Goodies powders or aspirin products. OK to take Tylenol and                          Celebrex.   _x___ Stop supplements until after surgery.  But may continue Vitamin D, Vitamin B,       and multivitamin.   ____ Bring C-Pap to the  hospital.

## 2018-11-16 MED ORDER — CIPROFLOXACIN IN D5W 400 MG/200ML IV SOLN
400.0000 mg | INTRAVENOUS | Status: AC
  Administered 2018-11-17: 13:00:00 400 mg via INTRAVENOUS

## 2018-11-17 ENCOUNTER — Ambulatory Visit: Payer: Medicare Other | Admitting: Anesthesiology

## 2018-11-17 ENCOUNTER — Ambulatory Visit
Admission: RE | Admit: 2018-11-17 | Discharge: 2018-11-17 | Disposition: A | Discharge status: Home / Self Care | Payer: Medicare Other | Attending: Urology | Admitting: Urology

## 2018-11-17 ENCOUNTER — Encounter
Admission: RE | Disposition: A | Discharge status: Home / Self Care | Payer: Self-pay | Source: Home / Self Care | Attending: Urology

## 2018-11-17 ENCOUNTER — Encounter: Payer: Self-pay | Admitting: Anesthesiology

## 2018-11-17 DIAGNOSIS — E669 Obesity, unspecified: Secondary | ICD-10-CM | POA: Diagnosis not present

## 2018-11-17 DIAGNOSIS — I10 Essential (primary) hypertension: Secondary | ICD-10-CM | POA: Diagnosis not present

## 2018-11-17 DIAGNOSIS — N35912 Unspecified bulbous urethral stricture, male: Secondary | ICD-10-CM | POA: Insufficient documentation

## 2018-11-17 DIAGNOSIS — Z6836 Body mass index (BMI) 36.0-36.9, adult: Secondary | ICD-10-CM | POA: Insufficient documentation

## 2018-11-17 DIAGNOSIS — N210 Calculus in bladder: Secondary | ICD-10-CM

## 2018-11-17 DIAGNOSIS — R310 Gross hematuria: Secondary | ICD-10-CM | POA: Diagnosis not present

## 2018-11-17 DIAGNOSIS — M1990 Unspecified osteoarthritis, unspecified site: Secondary | ICD-10-CM | POA: Insufficient documentation

## 2018-11-17 DIAGNOSIS — N35812 Other urethral bulbous stricture, male: Secondary | ICD-10-CM | POA: Diagnosis not present

## 2018-11-17 DIAGNOSIS — Z8546 Personal history of malignant neoplasm of prostate: Secondary | ICD-10-CM | POA: Diagnosis not present

## 2018-11-17 DIAGNOSIS — Z87442 Personal history of urinary calculi: Secondary | ICD-10-CM | POA: Insufficient documentation

## 2018-11-17 HISTORY — PX: BALLOON DILATION: SHX5330

## 2018-11-17 HISTORY — PX: CYSTOSCOPY: SHX5120

## 2018-11-17 SURGERY — BALLOON DILATION
Anesthesia: General | Site: Urethra

## 2018-11-17 MED ORDER — CIPROFLOXACIN IN D5W 400 MG/200ML IV SOLN
INTRAVENOUS | Status: AC
  Filled 2018-11-17: qty 200

## 2018-11-17 MED ORDER — FENTANYL CITRATE (PF) 100 MCG/2ML IJ SOLN
INTRAMUSCULAR | Status: AC
  Filled 2018-11-17: qty 2

## 2018-11-17 MED ORDER — GLYCOPYRROLATE 0.2 MG/ML IJ SOLN
INTRAMUSCULAR | Status: AC
  Filled 2018-11-17: qty 1

## 2018-11-17 MED ORDER — LACTATED RINGERS IV SOLN: INTRAVENOUS | Status: DC

## 2018-11-17 MED ORDER — PROPOFOL 10 MG/ML IV BOLUS
INTRAVENOUS | Status: DC | PRN
  Administered 2018-11-17: 30 mg via INTRAVENOUS
  Administered 2018-11-17: 140 mg via INTRAVENOUS

## 2018-11-17 MED ORDER — ONDANSETRON HCL 4 MG/2ML IJ SOLN
INTRAMUSCULAR | Status: AC
  Filled 2018-11-17: qty 2

## 2018-11-17 MED ORDER — LIDOCAINE HCL (PF) 2 % IJ SOLN
INTRAMUSCULAR | Status: AC
  Filled 2018-11-17: qty 10

## 2018-11-17 MED ORDER — ONDANSETRON HCL 4 MG/2ML IJ SOLN
INTRAMUSCULAR | Status: DC | PRN
  Administered 2018-11-17: 4 mg via INTRAVENOUS

## 2018-11-17 MED ORDER — GLYCOPYRROLATE 0.2 MG/ML IJ SOLN
INTRAMUSCULAR | Status: DC | PRN
  Administered 2018-11-17: 0.2 mg via INTRAVENOUS

## 2018-11-17 MED ORDER — LIDOCAINE HCL (CARDIAC) PF 100 MG/5ML IV SOSY
PREFILLED_SYRINGE | INTRAVENOUS | Status: DC | PRN
  Administered 2018-11-17: 100 mg via INTRAVENOUS

## 2018-11-17 MED ORDER — FENTANYL CITRATE (PF) 100 MCG/2ML IJ SOLN: 25.0000 ug | INTRAMUSCULAR | Status: DC | PRN

## 2018-11-17 MED ORDER — FENTANYL CITRATE (PF) 100 MCG/2ML IJ SOLN
INTRAMUSCULAR | Status: DC | PRN
  Administered 2018-11-17 (×2): 25 ug via INTRAVENOUS

## 2018-11-17 MED ORDER — ONDANSETRON HCL 4 MG/2ML IJ SOLN: 4.0000 mg | Freq: Once | INTRAMUSCULAR | Status: DC | PRN

## 2018-11-17 SURGICAL SUPPLY — 22 items
BAG DRAIN CYSTO-URO LG1000N (MISCELLANEOUS) ×4 IMPLANT
BAG URINE DRAINAGE (UROLOGICAL SUPPLIES) ×3 IMPLANT
BASKET ZERO TIP 1.9FR (BASKET) ×1 IMPLANT
BSKT STON RTRVL ZERO TP 1.9FR (BASKET)
CATH FOL 2WAY LX 16X5 (CATHETERS) ×3 IMPLANT
CATH FOL 2WAY LX 18X30 (CATHETERS) ×3 IMPLANT
CATH URETHRAL DIL 7.0X29 (CATHETERS) ×3 IMPLANT
ELECT REM PT RETURN 9FT ADLT (ELECTROSURGICAL)
ELECTRODE REM PT RTRN 9FT ADLT (ELECTROSURGICAL) IMPLANT
GLOVE BIO SURGEON STRL SZ 6.5 (GLOVE) ×3 IMPLANT
GLOVE BIO SURGEONS STRL SZ 6.5 (GLOVE) ×1
GOWN STRL REUS W/ TWL LRG LVL3 (GOWN DISPOSABLE) ×4 IMPLANT
GOWN STRL REUS W/TWL LRG LVL3 (GOWN DISPOSABLE) ×8
KIT TURNOVER CYSTO (KITS) ×4 IMPLANT
PACK CYSTO AR (MISCELLANEOUS) ×4 IMPLANT
SET CYSTO W/LG BORE CLAMP LF (SET/KITS/TRAYS/PACK) ×4 IMPLANT
SET IRRIG Y TYPE TUR BLADDER L (SET/KITS/TRAYS/PACK) ×1 IMPLANT
SYR 10ML LL (SYRINGE) ×4 IMPLANT
SYR 30ML LL (SYRINGE) ×4 IMPLANT
SYRINGE IRR TOOMEY STRL 70CC (SYRINGE) ×4 IMPLANT
WATER STERILE IRR 1000ML POUR (IV SOLUTION) ×4 IMPLANT
WATER STERILE IRR 3000ML UROMA (IV SOLUTION) ×4 IMPLANT

## 2018-11-17 NOTE — Discharge Instructions (Addendum)
Indwelling Urinary Catheter Care, Adult °An indwelling urinary catheter is a thin tube that is put into your bladder. The tube helps to drain pee (urine) out of your body. The tube goes in through your urethra. Your urethra is where pee comes out of your body. Your pee will come out through the catheter, then it will go into a bag (drainage bag). °Take good care of your catheter so it will work well. °How to wear your catheter and bag °Supplies needed °· Sticky tape (adhesive tape) or a leg strap. °· Alcohol wipe or soap and water (if you use tape). °· A clean towel (if you use tape). °· Large overnight bag. °· Smaller bag (leg bag). °Wearing your catheter °Attach your catheter to your leg with tape or a leg strap. °· Make sure the catheter is not pulled tight. °· If a leg strap gets wet, take it off and put on a dry strap. °· If you use tape to hold the bag on your leg: °1. Use an alcohol wipe or soap and water to wash your skin where the tape made it sticky before. °2. Use a clean towel to pat-dry that skin. °3. Use new tape to make the bag stay on your leg. °Wearing your bags °You should have been given a large overnight bag. °· You may wear the overnight bag in the day or night. °· Always have the overnight bag lower than your bladder.  Do not let the bag touch the floor. °· Before you go to sleep, put a clean plastic bag in a wastebasket. Then hang the overnight bag inside the wastebasket. °You should also have a smaller leg bag that fits under your clothes. °· Always wear the leg bag below your knee. °· Do not wear your leg bag at night. °How to care for your skin and catheter °Supplies needed °· A clean washcloth. °· Water and mild soap. °· A clean towel. °Caring for your skin and catheter ° °  ° °· Clean the skin around your catheter every day: °? Wash your hands with soap and water. °? Wet a clean washcloth in warm water and mild soap. °? Clean the skin around your urethra. °? If you are male: °? Gently  spread the folds of skin around your vagina (labia). °? With the washcloth in your other hand, wipe the inner side of your labia on each side. Wipe from front to back. °? If you are male: °? Pull back any skin that covers the end of your penis (foreskin). °? With the washcloth in your other hand, wipe your penis in small circles. Start wiping at the tip of your penis, then move away from the catheter. °? Move the foreskin back in place, if needed. °? With your free hand, hold the catheter close to where it goes into your body. °? Keep holding the catheter during cleaning so it does not get pulled out. °? With the washcloth in your other hand, clean the catheter. °? Only wipe downward on the catheter. °? Do not wipe upward toward your body. Doing this may push germs into your urethra and cause infection. °? Use a clean towel to pat-dry the catheter and the skin around it. Make sure to wipe off all soap. °? Wash your hands with soap and water. °· Shower every day. Do not take baths. °· Do not use cream, ointment, or lotion on the area where the catheter goes into your body, unless your doctor tells you   to. °· Do not use powders, sprays, or lotions on your genital area. °· Check your skin around the catheter every day for signs of infection. Check for: °? Redness, swelling, or pain. °? Fluid or blood. °? Warmth. °? Pus or a bad smell. °How to empty the bag °Supplies needed °· Rubbing alcohol. °· Gauze pad or cotton ball. °· Tape or a leg strap. °Emptying the bag °Pour the pee out of your bag when it is ?-½ full, or at least 2-3 times a day. Do this for your overnight bag and your leg bag. °1. Wash your hands with soap and water. °2. Separate (detach) the bag from your leg. °3. Hold the bag over the toilet or a clean pail. Keep the bag lower than your hips and bladder. This is so the pee (urine) does not go back into the tube. °4. Open the pour spout. It is at the bottom of the bag. °5. Empty the pee into the toilet or  pail. Do not let the pour spout touch any surface. °6. Put rubbing alcohol on a gauze pad or cotton ball. °7. Use the gauze pad or cotton ball to clean the pour spout. °8. Close the pour spout. °9. Attach the bag to your leg with tape or a leg strap. °10. Wash your hands with soap and water. °Follow instructions for cleaning the drainage bag: °· From the product maker. °· As told by your doctor. °How to change the bag °Supplies needed °· Alcohol wipes. °· A clean bag. °· Tape or a leg strap. °Changing the bag °Replace your bag when it starts to leak, smell bad, or look dirty. °1. Wash your hands with soap and water. °2. Separate the dirty bag from your leg. °3. Pinch the catheter with your fingers so that pee does not spill out. °4. Separate the catheter tube from the bag tube where these tubes connect (at the connection valve). Do not let the tubes touch any surface. °5. Clean the end of the catheter tube with an alcohol wipe. Use a different alcohol wipe to clean the end of the bag tube. °6. Connect the catheter tube to the tube of the clean bag. °7. Attach the clean bag to your leg with tape or a leg strap. Do not make the bag tight on your leg. °8. Wash your hands with soap and water. °General rules ° °· Never pull on your catheter. Never try to take it out. Doing that can hurt you. °· Always wash your hands before and after you touch your catheter or bag. Use a mild, fragrance-free soap. If you do not have soap and water, use hand sanitizer. °· Always make sure there are no twists or bends (kinks) in the catheter tube. °· Always make sure there are no leaks in the catheter or bag. °· Drink enough fluid to keep your pee pale yellow. °· Do not take baths, swim, or use a hot tub. °· If you are male, wipe from front to back after you poop (have a bowel movement). °Contact a doctor if: °· Your pee is cloudy. °· Your pee smells worse than usual. °· Your catheter gets clogged. °· Your catheter leaks. °· Your bladder  feels full. °Get help right away if: °· You have redness, swelling, or pain where the catheter goes into your body. °· You have fluid, blood, pus, or a bad smell coming from the area where the catheter goes into your body. °· Your skin feels warm where   the catheter goes into your body. °· You have a fever. °· You have pain in your: °? Belly (abdomen). °? Legs. °? Lower back. °? Bladder. °· You see blood in the catheter. °· Your pee is pink or red. °· You feel sick to your stomach (nauseous). °· You throw up (vomit). °· You have chills. °· Your pee is not draining into the bag. °· Your catheter gets pulled out. °Summary °· An indwelling urinary catheter is a thin tube that is placed into the bladder to help drain pee (urine) out of the body. °· The catheter is placed into the part of the body that drains pee from the bladder (urethra). °· Taking good care of your catheter will keep it working properly and help prevent problems. °· Always wash your hands before and after touching your catheter or bag. °· Never pull on your catheter or try to take it out. °This information is not intended to replace advice given to you by your health care provider. Make sure you discuss any questions you have with your health care provider. °Document Released: 06/16/2012 Document Revised: 06/13/2018 Document Reviewed: 10/05/2016 °[employer]vier Patient Education © 2020 [employer] ° °AMBULATORY SURGERY  °DISCHARGE INSTRUCTIONS ° ° °1) The drugs that you were given will stay in your system until tomorrow so for the next 24 hours you should not: ° °A) Drive an automobile °B) Make any legal decisions °C) Drink any alcoholic beverage ° ° °2) You may resume regular meals tomorrow.  Today it is better to start with liquids and gradually work up to solid foods. ° °You may eat anything you prefer, but it is better to start with liquids, then soup and crackers, and gradually work up to solid foods. ° ° °3) Please notify your doctor immediately if  you have any unusual bleeding, trouble breathing, redness and pain at the surgery site, drainage, fever, or pain not relieved by medication. ° ° ° °4) Additional Instructions: ° ° ° ° ° ° ° °Please contact your physician with any problems or Same Day Surgery at [phone], Monday through Friday 6 am to 4 pm, or [facility] number at [phone]. ° °

## 2018-11-17 NOTE — Anesthesia Preprocedure Evaluation (Signed)
Anesthesia Evaluation  Patient identified by MRN, date of birth, ID band Patient awake    Reviewed: Allergy & Precautions, NPO status , Patient's Chart, lab work & pertinent test results, reviewed documented beta blocker date and time   Airway Mallampati: III  TM Distance: >3 FB     Dental  (+) Chipped   Pulmonary           Cardiovascular hypertension, Pt. on medications and Pt. on home beta blockers      Neuro/Psych Anxiety  Neuromuscular disease    GI/Hepatic GERD  Controlled,  Endo/Other    Renal/GU Renal disease     Musculoskeletal  (+) Arthritis ,   Abdominal   Peds  Hematology   Anesthesia Other Findings Obese. Gout. EKG reviewed and ok.  Reproductive/Obstetrics                             Anesthesia Physical Anesthesia Plan  ASA: III  Anesthesia Plan: General   Post-op Pain Management:    Induction: Intravenous  PONV Risk Score and Plan:   Airway Management Planned: Oral ETT and LMA  Additional Equipment:   Intra-op Plan:   Post-operative Plan:   Informed Consent: I have reviewed the patients History and Physical, chart, labs and discussed the procedure including the risks, benefits and alternatives for the proposed anesthesia with the patient or authorized representative who has indicated his/her understanding and acceptance.       Plan Discussed with: CRNA  Anesthesia Plan Comments:         Anesthesia Quick Evaluation

## 2018-11-17 NOTE — Transfer of Care (Signed)
Immediate Anesthesia Transfer of Care Note  Patient: [name]  Procedure(s) Performed: URETHRAL BALLOON DILATION (N/A Urethra) CYSTOSCOPY (N/A Urethra)  Patient Location: PACU  Anesthesia Type:General  Level of Consciousness: awake, alert  and oriented  Airway & Oxygen Therapy: Patient Spontanous Breathing and Patient connected to face mask oxygen  Post-op Assessment: Report given to RN and Post -op Vital signs reviewed and stable  Post vital signs: Reviewed and stable  Last Vitals:  Vitals Value Taken Time  BP 110/61 11/17/18 1352  Temp    Pulse 64 11/17/18 1357  Resp 10 11/17/18 1357  SpO2 99 % 11/17/18 1357  Vitals shown include unvalidated device data.  Last Pain:  Vitals:   11/17/18 1131  TempSrc: Tympanic  PainSc: 0-No pain         Complications: No apparent anesthesia complications

## 2018-11-17 NOTE — Op Note (Signed)
Date of procedure: 11/17/18  Preoperative diagnosis:  1. Bulbous urethral stricture 2. Bladder stone  Postoperative diagnosis:  1. Bulbous urethral stricture  Procedure: 1. Balloon dilation of urethra 2. Cystoscopy  Surgeon: Hollice Espy, MD  Anesthesia: General  Complications: None  Intraoperative findings: 19 French bulbar urethral stricture, 9/10.  Holep defect within the prostatic fossa with a small amount of right-sided lateral lobe regrowth, widely patent bladder neck.  Bladder stone not identified.  EBL: Minimal  Specimens: None  Drains: 18 French Foley catheter with 25 cc in the balloon  Indication: [name] is a 78 y.o. patient with BPH status post holep found to have a small bladder stone and bulbar urethral stricture.  After reviewing the management options for treatment, he elected to proceed with the above surgical procedure(s). We have discussed the potential benefits and risks of the procedure, side effects of the proposed treatment, the likelihood of the patient achieving the goals of the procedure, and any potential problems that might occur during the procedure or recuperation. Informed consent has been obtained.  Description of procedure:  The patient was taken to the operating room and general anesthesia was induced.  The patient was placed in the dorsal lithotomy position, prepped and draped in the usual sterile fashion, and preoperative antibiotics were administered. A preoperative time-out was performed.   27 French resectoscope was advanced per urethra to the level of the bulbar urethral stricture.  A wire was placed under direct visualization through the small opening into the bladder.  I was then able to run the scope through this area as the stricture was not particularly dense opening it widely.  The prostatic fossa had a large widely open defect down to the level of the capsule on the left side without regrowth a small amount of regrowth on the  right.  The bladder neck remained widely patent.  The bladder was inspected there is no stone identified.  In the next Angelyn's for a 70 degree lens to inspect the bladder neck and again no bladder stone was identified.  Presumably, upon scoping him in the office I was able to dislodge and open his urethra pain enough to pass a stone.  Postoperatively, his wife indicates that he has been passing small amount of stone material since the office procedure.  The scope was then backed out of the bladder.  A Cook balloon was then placed over the wire to traverse the bulbar urethral stricture the balloon was filled 10 cc of water and allowed to stay for several minutes and time up to 24 French in diameter to further dilate the bulbar urethral stricture.  And then rescoped appreciating a widely patent strictured area through which a scope was easily able to traverse.  The scope and the wire were then removed and an 65 French Foley catheter was placed easily into the bladder.  The balloon was filled with 25 cc of water given his wide open bladder neck in order to avoid the catheter is slipping into his prostatic fossa.  The urine was clear.  The patient was then repositioned supine incision, reversed myesthesia, taken the PACU in stable condition.  Plan: I have him return in 3 days for Foley catheter removal.  Findings were discussed with the patient's wife.  We will likely follow him clinically following this procedure to ensure the symptoms do not recur.  Hollice Espy, M.D.

## 2018-11-17 NOTE — Anesthesia Post-op Follow-up Note (Signed)
Anesthesia QCDR form completed.        

## 2018-11-17 NOTE — Anesthesia Procedure Notes (Signed)
Procedure Name: LMA Insertion Date/Time: 11/17/2018 1:25 PM Performed by: Rona Ravens, CRNA Pre-anesthesia Checklist: Patient identified, Emergency Drugs available, Patient being monitored, Timeout performed and Suction available Patient Re-evaluated:Patient Re-evaluated prior to induction Oxygen Delivery Method: Circle system utilized Preoxygenation: Pre-oxygenation with 100% oxygen Induction Type: IV induction LMA: LMA inserted LMA Size: 5.0 Number of attempts: 1 Tube secured with: Tape Dental Injury: Teeth and Oropharynx as per pre-operative assessment

## 2018-11-17 NOTE — Interval H&P Note (Signed)
History and Physical Interval Note:  11/17/2018 12:57 PM  [name]  has presented today for surgery, with the diagnosis of urethral stricture, bladder stone.  The various methods of treatment have been discussed with the patient and family. After consideration of risks, benefits and other options for treatment, the patient has consented to  Procedure(s): URETHRAL BALLOON DILATION (N/A) CYSTOSCOPY WITH LITHOLAPAXY (N/A) as a surgical intervention.  The patient's history has been reviewed, patient examined, no change in status, stable for surgery.  I have reviewed the patient's chart and labs.  Questions were answered to the patient's satisfaction.    RRR CTAB   Hollice Espy

## 2018-11-19 ENCOUNTER — Encounter: Payer: Self-pay | Admitting: Urology

## 2018-11-19 NOTE — Anesthesia Postprocedure Evaluation (Signed)
Anesthesia Post Note  Patient: [name]  Procedure(s) Performed: URETHRAL BALLOON DILATION (N/A Urethra) CYSTOSCOPY (N/A Urethra)  Patient location during evaluation: PACU Anesthesia Type: General Level of consciousness: awake and alert Pain management: pain level controlled Vital Signs Assessment: post-procedure vital signs reviewed and stable Respiratory status: spontaneous breathing, nonlabored ventilation, respiratory function stable and patient connected to nasal cannula oxygen Cardiovascular status: blood pressure returned to baseline and stable Postop Assessment: no apparent nausea or vomiting Anesthetic complications: no     Last Vitals:  Vitals:   11/17/18 1422 11/17/18 1445  BP: 125/65 (!) 142/70  Pulse: 63 (!) 57  Resp: 11 14  Temp: (!) 36.2 C   SpO2: 100% 98%    Last Pain:  Vitals:   11/17/18 1445  TempSrc:   PainSc: 0-No pain                 Davison Ohms S

## 2018-11-20 ENCOUNTER — Other Ambulatory Visit: Payer: Self-pay

## 2018-11-20 ENCOUNTER — Ambulatory Visit: Payer: Medicare Other

## 2018-11-20 ENCOUNTER — Ambulatory Visit: Payer: Medicare Other | Admitting: *Deleted

## 2018-11-20 DIAGNOSIS — N35912 Unspecified bulbous urethral stricture, male: Secondary | ICD-10-CM

## 2018-11-20 NOTE — Progress Notes (Signed)
Catheter Removal  Patient is present today for a catheter removal.  31ml of water was drained from the balloon. A 16FR foley cath was removed from the bladder no complications were notedPatient tolerated well.  Performed by: Verlene Mayer, CMA  Follow up/ Additional notes: Instructed to push fluids and return this afternoon if unable to urinate for PVR. Verbalized understanding.

## 2018-11-26 ENCOUNTER — Encounter: Payer: Self-pay | Admitting: Family Medicine

## 2019-01-27 ENCOUNTER — Ambulatory Visit: Payer: Medicare Other | Admitting: Urology

## 2019-01-27 ENCOUNTER — Encounter: Payer: Self-pay | Admitting: Urology

## 2019-01-27 ENCOUNTER — Other Ambulatory Visit: Payer: Self-pay

## 2019-01-27 VITALS — BP 159/70 | HR 62 | Ht 69.0 in | Wt 254.0 lb

## 2019-01-27 DIAGNOSIS — R310 Gross hematuria: Secondary | ICD-10-CM

## 2019-01-27 DIAGNOSIS — C61 Malignant neoplasm of prostate: Secondary | ICD-10-CM | POA: Diagnosis not present

## 2019-01-27 DIAGNOSIS — R350 Frequency of micturition: Secondary | ICD-10-CM

## 2019-01-27 DIAGNOSIS — N35912 Unspecified bulbous urethral stricture, male: Secondary | ICD-10-CM

## 2019-01-27 NOTE — Progress Notes (Signed)
01/27/2019 10:18 AM   [name] 26-Nov-1940 [id number]  Referring provider: Leone Haven, MD [address] [place],  [place] [address]  Chief Complaint  Patient presents with  . Benign Prostatic Hypertrophy    HPI: 78 year old male who returns today for follow-up.  He has a personal history of incidental prostate cancer, BPH status post holep and more recently gross hematuria likely related to dystrophic calcifications with incidental urethral stricture.  He has a personal history of BPH status post holep/cystolitholapaxy in 10/2017.  He was doing well but ultimately developed recurrent gross hematuria secondary to dystrophic calcifications and was also found to have a bulbar urethral stricture.  He returned to the operating room on 11/17/2018 for dilation of bulbar urethral stricture.  Foley was left in place for 3 days.  He reports today that he seems to be voiding much better.  His stream has improved.  He did have an episode of gross hematuria about 2 weeks ago less than a day and resolved spontaneously.  He was not associated with any pain.  Overall, he is doing well.  He is taking Myrbetriq for urinary urgency frequency and is wondering if he can stop this medication since his symptoms have improved.  IPSS as below.  He does also have a personal history of elevated PSA status post previous negative biopsy but found to have incidental prostate cancer at the time of outlet surgery.The surgical pathology showed 6 7 g of resected tissue, with incidental finding of Gleason 3+4, grade group 2 and less than 5% of the tissue. There was also acute and chronic inflammation.  He also has a personal history of elevated PSA status post negative biopsy in 2017.His PSA remains stably elevated, most recently 6.03-2017.Preop prostate volume 120 cc.New baseline PSA is 3.4ng/mL from 01/17/18 following surgery.  Most recent  PSA 5/14 4.6, slightly up from new baseline.  He  underwent prostate MRI on 08/19/2018 which showed a nodular transition zone consistent with BPH, PI-RADS 1 and some incidental asymmetry of the seminal vesicle, otherwise no evidence of high-grade lesions.  Newly calculated prostate static volume 56 cc.  PSA today is pending.    IPSS    Row Name 01/27/19 0800         International Prostate Symptom Score   How often have you had the sensation of not emptying your bladder?  Less than 1 in 5     How often have you had to urinate less than every two hours?  About half the time     How often have you found you stopped and started again several times when you urinated?  Less than half the time     How often have you found it difficult to postpone urination?  Less than half the time     How often have you had a weak urinary stream?  Not at All     How often have you had to strain to start urination?  About half the time     How many times did you typically get up at night to urinate?  3 Times     Total IPSS Score  14       Quality of Life due to urinary symptoms   If you were to spend the rest of your life with your urinary condition just the way it is now how would you feel about that?  Mostly Satisfied        Score:  1-7 Mild  8-19 Moderate 20-35 Severe   PMH: Past Medical History:  Diagnosis Date  . Allergy   . Anxiety   . Arthritis   . Cataract    beginning stage bilateral  . Elevated PSA   . GERD (gastroesophageal reflux disease)   . Gout   . History of kidney stones   . Hyperlipidemia   . Hypertension   . Inguinal hernia     Surgical History: Past Surgical History:  Procedure Laterality Date  . BACK SURGERY  1980's   L4/5  . BALLOON DILATION N/A 11/17/2018   Procedure: URETHRAL BALLOON DILATION;  Surgeon: Hollice Espy, MD;  Location: [facility];  Service: Urology;  Laterality: N/A;  . CARPAL TUNNEL RELEASE Right 08/15/2016   Procedure: CARPAL TUNNEL RELEASE ENDOSCOPIC;  Surgeon: Corky Mull, MD;  Location:  [place];  Service: Orthopedics;  Laterality: Right;  . COLONOSCOPY    . CYSTOSCOPY N/A 11/17/2018   Procedure: CYSTOSCOPY;  Surgeon: Hollice Espy, MD;  Location: [facility];  Service: Urology;  Laterality: N/A;  . CYSTOSCOPY WITH LITHOLAPAXY N/A 10/09/2017   Procedure: CYSTOSCOPY WITH LITHOLAPAXY;  Surgeon: Hollice Espy, MD;  Location: [facility];  Service: Urology;  Laterality: N/A;  . ESOPHAGOGASTRODUODENOSCOPY  11-21-06   Esophagitis, Stricture/dilated   . HOLEP-LASER ENUCLEATION OF THE PROSTATE WITH MORCELLATION N/A 10/09/2017   Procedure: HOLEP-LASER ENUCLEATION OF THE PROSTATE WITH MORCELLATION;  Surgeon: Hollice Espy, MD;  Location: [facility];  Service: Urology;  Laterality: N/A;  . LYMPH NODE BIOPSY  1957   benign  lymph node removed  . Right arm fracture    . TONSILLECTOMY    . TOTAL KNEE ARTHROPLASTY  09-02-02   Bilateral  . UPPER GASTROINTESTINAL ENDOSCOPY      Home Medications:  Allergies as of 01/27/2019      Reactions   Penicillins Rash   Did it involve swelling of the face/tongue/throat, SOB, or low BP? No Did it involve sudden or severe rash/hives, skin peeling, or any reaction on the inside of your mouth or nose? No Did you need to seek medical attention at a hospital or doctor's office? No When did it last happen?50 Years If all above answers are "NO", may proceed with cephalosporin use.   Acetaminophen Itching   Aspirin Itching   OK w/low dose aspirin       Medication List       Accurate as of January 27, 2019 10:18 AM. If you have any questions, ask your nurse or doctor.        STOP taking these medications   naftifine 1 % cream Commonly known as: NAFTIN Stopped by: Hollice Espy, MD     TAKE these medications   acyclovir ointment 5 % Commonly known as: ZOVIRAX Apply topically every 3 (three) hours. As needed for cold sores   amLODipine 5 MG tablet Commonly known as: NORVASC TAKE 1 TABLET BY MOUTH EVERY DAY What changed: when  to take this   aspirin 81 MG tablet Take 81 mg by mouth daily.   colchicine 0.6 MG tablet TAKE 1 TABLET (0.6 MG TOTAL) BY MOUTH DAILY AS NEEDED (WHEN HAVING A GOUT FLARE UP).   [employer] Spectravite Senior Tabs Take 1 tablet by mouth daily.   fluocinonide ointment 0.05 % Commonly known as: LIDEX Apply 1 application topically daily. Apply to right knee   LAMISIL AT ATHLETES FOOT EX   metoprolol succinate 50 MG 24 hr tablet Commonly known as: TOPROL-XL TAKE 1 TABLET BY MOUTH DAILY. What changed:  how much to take  how to take this  when to take this  additional instructions   mirabegron ER 25 MG Tb24 tablet Commonly known as: MYRBETRIQ Take 1 tablet (25 mg total) by mouth daily.   naproxen sodium 220 MG tablet Commonly known as: ALEVE Take 220 mg by mouth daily as needed (pain).   pantoprazole 40 MG tablet Commonly known as: PROTONIX TAKE 1 TABLET BY MOUTH TWICE A DAY What changed: when to take this   TART CHERRY ADVANCED PO Take 425 mg by mouth daily.       Allergies:  Allergies  Allergen Reactions  . Penicillins Rash    Did it involve swelling of the face/tongue/throat, SOB, or low BP? No Did it involve sudden or severe rash/hives, skin peeling, or any reaction on the inside of your mouth or nose? No Did you need to seek medical attention at a hospital or doctor's office? No When did it last happen?50 Years If all above answers are "NO", may proceed with cephalosporin use.   . Acetaminophen Itching  . Aspirin Itching    OK w/low dose aspirin     Family History: Family History  Problem Relation Age of Onset  . Ulcers Father   . Diverticulitis Father   . Arthritis Father   . Hypertension Brother   . Obesity Brother   . Cancer Brother        throat  . Arthritis Mother   . Depression Mother        Anxiety  . Colon cancer Neg Hx   . Colon polyps Neg Hx   . Esophageal cancer Neg Hx   . Rectal cancer Neg Hx   . Stomach cancer Neg Hx   .  Kidney disease Neg Hx   . Kidney cancer Neg Hx   . Prostate cancer Neg Hx     Social History:  reports that he has never smoked. He has never used smokeless tobacco. He reports that he does not drink alcohol or use drugs.  ROS: UROLOGY Frequent Urination?: No Hard to postpone urination?: No Burning/pain with urination?: No Get up at night to urinate?: No Leakage of urine?: No Urine stream starts and stops?: No Trouble starting stream?: No Do you have to strain to urinate?: No Blood in urine?: No Urinary tract infection?: No Sexually transmitted disease?: No Injury to kidneys or bladder?: No Painful intercourse?: No Weak stream?: No Erection problems?: No Penile pain?: No  Gastrointestinal Nausea?: No Vomiting?: No Indigestion/heartburn?: No Diarrhea?: No Constipation?: No  Constitutional Fever: No Night sweats?: No Weight loss?: No Fatigue?: No  Skin Skin rash/lesions?: No Itching?: No  Eyes Blurred vision?: No Double vision?: No  Ears/Nose/Throat Sore throat?: No Sinus problems?: No  Hematologic/Lymphatic Swollen glands?: No Easy bruising?: No  Cardiovascular Leg swelling?: No Chest pain?: No  Respiratory Cough?: No Shortness of breath?: No  Endocrine Excessive thirst?: No  Musculoskeletal Back pain?: No Joint pain?: No  Neurological Headaches?: No Dizziness?: No  Psychologic Depression?: No Anxiety?: No  Physical Exam: BP (!) 159/70   Pulse 62   Ht 5\' 9"  (1.753 m)   Wt 254 lb (115.2 kg)   BMI 37.51 kg/m   Constitutional:  Alert and oriented, No acute distress. HEENT: [place] AT, moist mucus membranes.  Trachea midline, no masses. Cardiovascular: No clubbing, cyanosis, or edema. Respiratory: Normal respiratory effort, no increased work of breathing. Skin: No rashes, bruises or suspicious lesions. Neurologic: Grossly intact, no focal deficits, moving all 4 extremities. Psychiatric:  Normal mood and affect.  Laboratory Data: Lab  Results  Component Value Date   WBC 8.5 11/13/2018   HGB 14.1 11/13/2018   HCT 42.8 11/13/2018   MCV 89.9 11/13/2018   PLT 212 11/13/2018    Lab Results  Component Value Date   CREATININE 1.27 11/04/2018    Pertinent Imaging:  Assessment & Plan:    1. Prostate cancer [facility]) Incidental Gleason 3+4 prostate cancer on holep Given the very minute amount, he is elected active surveillance given his age and comorbidities Most recent MRI 08/2018 reassuring We will continue to check PSA every 6 month, today's pending He is agreeable this plan - PSA  2. Bulbous urethral stricture Status post urethral dilation Symptomatically improved We will continue to follow clinically  3. Frequent urination Overall improvement, questions whether he needs to continue Myrbetriq I have recommended a "holiday" to see if he needs this medication.  I advised him to stop this medication at least a week and see if his symptoms worsen, resume as needed He is agreeable this plan  4. Gross hematuria Status post recent reevaluation, likely secondary to dystrophic calcifications in irregular prostatic mucosa We will repeat hematuria evaluation in a year if his symptoms recur   Return in about 6 months (around 07/27/2019) for PSA/ DRE, IPSS/ PVR/ UA.  Hollice Espy, MD  [facility] [address], [place], [place] [address] [phone]

## 2019-01-28 LAB — PSA: Prostate Specific Ag, Serum: 6.4 ng/mL — ABNORMAL HIGH (ref 0.0–4.0)

## 2019-02-03 ENCOUNTER — Telehealth: Payer: Self-pay

## 2019-02-03 NOTE — Telephone Encounter (Signed)
Called patient and left vmail to call, mychart also sent

## 2019-02-03 NOTE — Telephone Encounter (Signed)
-----   Message from Hollice Espy, MD sent at 02/03/2019 10:40 AM EST ----- PSA is up again, this time to 6.4.  This is around her was about a year ago.  In light of recent negative prostate MRI, I think we should just stay the course for the time being.  We will recheck a PSA in 6 months.  If it continues to elevate at that point in time, may consider an office biopsy or some other intervention.  Hollice Espy, MD

## 2019-02-11 ENCOUNTER — Encounter: Payer: Self-pay | Admitting: Family Medicine

## 2019-02-11 ENCOUNTER — Ambulatory Visit (INDEPENDENT_AMBULATORY_CARE_PROVIDER_SITE_OTHER): Payer: Medicare Other | Admitting: Family Medicine

## 2019-02-11 ENCOUNTER — Other Ambulatory Visit: Payer: Self-pay

## 2019-02-11 DIAGNOSIS — R350 Frequency of micturition: Secondary | ICD-10-CM | POA: Diagnosis not present

## 2019-02-11 DIAGNOSIS — C61 Malignant neoplasm of prostate: Secondary | ICD-10-CM | POA: Diagnosis not present

## 2019-02-11 DIAGNOSIS — R310 Gross hematuria: Secondary | ICD-10-CM

## 2019-02-11 DIAGNOSIS — I10 Essential (primary) hypertension: Secondary | ICD-10-CM | POA: Diagnosis not present

## 2019-02-11 DIAGNOSIS — J309 Allergic rhinitis, unspecified: Secondary | ICD-10-CM | POA: Insufficient documentation

## 2019-02-11 DIAGNOSIS — M1000 Idiopathic gout, unspecified site: Secondary | ICD-10-CM

## 2019-02-11 NOTE — Patient Instructions (Signed)
Follow up in 6 months 

## 2019-02-11 NOTE — Assessment & Plan Note (Signed)
Typically occurs this time a year.  Response to antihistamine use.  He will monitor.  Discussed COVID-19 and social distancing precautions and safety precautions.  He will contact us if he develops any COVID-19 symptoms.

## 2019-02-11 NOTE — Assessment & Plan Note (Signed)
Likely related to stone issues.  He will continue to follow with urology.

## 2019-02-11 NOTE — Assessment & Plan Note (Signed)
Reports Myrbetriq has been beneficial.  He will continue this through urology.

## 2019-02-11 NOTE — Assessment & Plan Note (Signed)
No recent issues.  He will continue as needed colchicine when he has flares.

## 2019-02-11 NOTE — Assessment & Plan Note (Signed)
Adequately controlled for age.  Discussed that if it starts to go above 150/90 at home he needs to let us know.  We will continue amlodipine and metoprolol.

## 2019-02-11 NOTE — Assessment & Plan Note (Signed)
Stable.  He will continue to follow with urology.

## 2019-02-11 NOTE — Progress Notes (Signed)
Virtual Visit via telephone Note  This visit type was conducted due to national recommendations for restrictions regarding the COVID-19 pandemic (e.g. social distancing).  This format is felt to be most appropriate for this patient at this time.  All issues noted in this document were discussed and addressed.  No physical exam was performed (except for noted visual exam findings with Video Visits).   I connected with [name] today at 10:00 AM EST by telephone and verified that I am speaking with the correct person using two identifiers. Location patient: work Location provider: home office Persons participating in the virtual visit: patient, provider  I discussed the limitations, risks, security and privacy concerns of performing an evaluation and management service by telephone and the availability of in person appointments. I also discussed with the patient that there may be a patient responsible charge related to this service. The patient expressed understanding and agreed to proceed.  Interactive audio and video telecommunications were attempted between this provider and patient, however failed, due to patient having technical difficulties OR patient did not have access to video capability.  We continued and completed visit with audio only.   Reason for visit: follow-up  HPI: HYPERTENSION  Disease Monitoring  Home BP Monitoring none none none none 140-150/90 chest pain-no    dyspnea-no Medications  Compliance-taking amlodipine metoprolol.   Edema-no  Prostate cancer/urinary frequency/hematuria: He continues to follow with urology.  He does note some hematuria on occasion and reports urology thinks he is passing small stones.  He did have a hematuria evaluation recently.  He continues on Myrbetriq which is helpful to control his urination.  Gout: Has not had an issue in the last 5 to 6 months.  He only takes colchicine with flares.  Allergies: Patient notes occasional congestion  this time of year.  This has been going on for 25 years.  No sneezing.  No postnasal drip.  No fevers.  No shortness of breath.  He does note someone he was speaking with had there husband test positive for Covid though he has had no direct Covid exposure.  He wears a mask and washes his hands frequently.    ROS: See pertinent positives and negatives per HPI.  Past Medical History:  Diagnosis Date  . Allergy   . Anxiety   . Arthritis   . Cataract    beginning stage bilateral  . Elevated PSA   . GERD (gastroesophageal reflux disease)   . Gout   . History of kidney stones   . Hyperlipidemia   . Hypertension   . Inguinal hernia     Past Surgical History:  Procedure Laterality Date  . BACK SURGERY  1980's   L4/5  . BALLOON DILATION N/A 11/17/2018   Procedure: URETHRAL BALLOON DILATION;  Surgeon: Hollice Espy, MD;  Location: [facility];  Service: Urology;  Laterality: N/A;  . CARPAL TUNNEL RELEASE Right 08/15/2016   Procedure: CARPAL TUNNEL RELEASE ENDOSCOPIC;  Surgeon: Corky Mull, MD;  Location: [place];  Service: Orthopedics;  Laterality: Right;  . COLONOSCOPY    . CYSTOSCOPY N/A 11/17/2018   Procedure: CYSTOSCOPY;  Surgeon: Hollice Espy, MD;  Location: [facility];  Service: Urology;  Laterality: N/A;  . CYSTOSCOPY WITH LITHOLAPAXY N/A 10/09/2017   Procedure: CYSTOSCOPY WITH LITHOLAPAXY;  Surgeon: Hollice Espy, MD;  Location: [facility];  Service: Urology;  Laterality: N/A;  . ESOPHAGOGASTRODUODENOSCOPY  11-21-06   Esophagitis, Stricture/dilated   . HOLEP-LASER ENUCLEATION OF THE PROSTATE WITH MORCELLATION N/A  10/09/2017   Procedure: HOLEP-LASER ENUCLEATION OF THE PROSTATE WITH MORCELLATION;  Surgeon: Hollice Espy, MD;  Location: [facility];  Service: Urology;  Laterality: N/A;  . LYMPH NODE BIOPSY  1957   benign  lymph node removed  . Right arm fracture    . TONSILLECTOMY    . TOTAL KNEE ARTHROPLASTY  09-02-02   Bilateral  . UPPER GASTROINTESTINAL ENDOSCOPY       Family History  Problem Relation Age of Onset  . Ulcers Father   . Diverticulitis Father   . Arthritis Father   . Hypertension Brother   . Obesity Brother   . Cancer Brother        throat  . Arthritis Mother   . Depression Mother        Anxiety  . Colon cancer Neg Hx   . Colon polyps Neg Hx   . Esophageal cancer Neg Hx   . Rectal cancer Neg Hx   . Stomach cancer Neg Hx   . Kidney disease Neg Hx   . Kidney cancer Neg Hx   . Prostate cancer Neg Hx     SOCIAL HX: Non-smoker   Current Outpatient Medications:  .  acyclovir ointment (ZOVIRAX) 5 %, Apply topically every 3 (three) hours. As needed for cold sores, Disp: 30 g, Rfl: 3 .  amLODipine (NORVASC) 5 MG tablet, TAKE 1 TABLET BY MOUTH EVERY DAY (Patient taking differently: Take 5 mg by mouth at bedtime. ), Disp: 90 tablet, Rfl: 1 .  aspirin 81 MG tablet, Take 81 mg by mouth daily., Disp: , Rfl:  .  colchicine 0.6 MG tablet, TAKE 1 TABLET (0.6 MG TOTAL) BY MOUTH DAILY AS NEEDED (WHEN HAVING A GOUT FLARE UP)., Disp: 30 tablet, Rfl: 3 .  fluocinonide ointment (LIDEX) [id number] %, Apply 1 application topically daily. Apply to right knee, Disp: , Rfl:  .  metoprolol succinate (TOPROL-XL) 50 MG 24 hr tablet, TAKE 1 TABLET BY MOUTH DAILY. (Patient taking differently: Take 50 mg by mouth daily. ), Disp: 90 tablet, Rfl: 1 .  mirabegron ER (MYRBETRIQ) 25 MG TB24 tablet, Take 1 tablet (25 mg total) by mouth daily., Disp: 30 tablet, Rfl: 11 .  Misc Natural Products (TART CHERRY ADVANCED PO), Take 425 mg by mouth daily., Disp: , Rfl:  .  Multiple Vitamins-Minerals ([employer] SPECTRAVITE SENIOR) TABS, Take 1 tablet by mouth daily. , Disp: , Rfl:  .  naproxen sodium (ALEVE) 220 MG tablet, Take 220 mg by mouth daily as needed (pain)., Disp: , Rfl:  .  pantoprazole (PROTONIX) 40 MG tablet, TAKE 1 TABLET BY MOUTH TWICE A DAY (Patient taking differently: Take 40 mg by mouth daily. ), Disp: 180 tablet, Rfl: 3 .  Terbinafine HCl (LAMISIL AT ATHLETES FOOT EX), ,  Disp: , Rfl:   Current Facility-Administered Medications:  .  0.9 %  sodium chloride infusion, 500 mL, Intravenous, Once, Armbruster, Carlota Raspberry, MD  EXAM: This was a telehealth telephone visit and thus no physical exam was completed.  ASSESSMENT AND PLAN:  Discussed the following assessment and plan:  Essential hypertension Adequately controlled for age.  Discussed that if it starts to go above 150/90 at home he needs to let us know.  We will continue amlodipine and metoprolol.  Prostate cancer ([place]) Stable.  He will continue to follow with urology.  Gross hematuria Likely related to stone issues.  He will continue to follow with urology.  Frequent urination Reports Myrbetriq has been beneficial.  He will continue this through  urology.  Allergic rhinitis Typically occurs this time a year.  Response to antihistamine use.  He will monitor.  Discussed COVID-19 and social distancing precautions and safety precautions.  He will contact us if he develops any COVID-19 symptoms.  Gout No recent issues.  He will continue as needed colchicine when he has flares.    I discussed the assessment and treatment plan with the patient. The patient was provided an opportunity to ask questions and all were answered. The patient agreed with the plan and demonstrated an understanding of the instructions.   The patient was advised to call back or seek an in-person evaluation if the symptoms worsen or if the condition fails to improve as anticipated.  I provided 11 minutes of non-face-to-face time during this encounter.   Tommi Rumps, MD

## 2019-02-19 ENCOUNTER — Other Ambulatory Visit: Payer: Self-pay | Admitting: Family Medicine

## 2019-02-20 ENCOUNTER — Other Ambulatory Visit: Payer: Self-pay | Admitting: Urology

## 2019-04-07 DIAGNOSIS — M5033 Other cervical disc degeneration, cervicothoracic region: Secondary | ICD-10-CM | POA: Diagnosis not present

## 2019-04-07 DIAGNOSIS — M5136 Other intervertebral disc degeneration, lumbar region: Secondary | ICD-10-CM | POA: Diagnosis not present

## 2019-04-07 DIAGNOSIS — M9901 Segmental and somatic dysfunction of cervical region: Secondary | ICD-10-CM | POA: Diagnosis not present

## 2019-04-07 DIAGNOSIS — M9903 Segmental and somatic dysfunction of lumbar region: Secondary | ICD-10-CM | POA: Diagnosis not present

## 2019-04-15 ENCOUNTER — Ambulatory Visit: Payer: Medicare Other | Admitting: Family Medicine

## 2019-05-06 DIAGNOSIS — M5033 Other cervical disc degeneration, cervicothoracic region: Secondary | ICD-10-CM | POA: Diagnosis not present

## 2019-05-06 DIAGNOSIS — M5136 Other intervertebral disc degeneration, lumbar region: Secondary | ICD-10-CM | POA: Diagnosis not present

## 2019-05-06 DIAGNOSIS — M9901 Segmental and somatic dysfunction of cervical region: Secondary | ICD-10-CM | POA: Diagnosis not present

## 2019-05-06 DIAGNOSIS — M9903 Segmental and somatic dysfunction of lumbar region: Secondary | ICD-10-CM | POA: Diagnosis not present

## 2019-05-11 ENCOUNTER — Other Ambulatory Visit: Payer: Self-pay | Admitting: Family Medicine

## 2019-05-11 DIAGNOSIS — I10 Essential (primary) hypertension: Secondary | ICD-10-CM

## 2019-06-03 DIAGNOSIS — M5136 Other intervertebral disc degeneration, lumbar region: Secondary | ICD-10-CM | POA: Diagnosis not present

## 2019-06-03 DIAGNOSIS — M9903 Segmental and somatic dysfunction of lumbar region: Secondary | ICD-10-CM | POA: Diagnosis not present

## 2019-06-03 DIAGNOSIS — M5033 Other cervical disc degeneration, cervicothoracic region: Secondary | ICD-10-CM | POA: Diagnosis not present

## 2019-06-03 DIAGNOSIS — M9901 Segmental and somatic dysfunction of cervical region: Secondary | ICD-10-CM | POA: Diagnosis not present

## 2019-06-16 DIAGNOSIS — M25562 Pain in left knee: Secondary | ICD-10-CM | POA: Diagnosis not present

## 2019-06-16 DIAGNOSIS — Z96653 Presence of artificial knee joint, bilateral: Secondary | ICD-10-CM | POA: Diagnosis not present

## 2019-06-17 ENCOUNTER — Other Ambulatory Visit (HOSPITAL_COMMUNITY): Payer: Self-pay | Admitting: Physician Assistant

## 2019-06-17 ENCOUNTER — Other Ambulatory Visit: Payer: Self-pay | Admitting: Physician Assistant

## 2019-06-17 DIAGNOSIS — Z96653 Presence of artificial knee joint, bilateral: Secondary | ICD-10-CM

## 2019-07-01 DIAGNOSIS — M5136 Other intervertebral disc degeneration, lumbar region: Secondary | ICD-10-CM | POA: Diagnosis not present

## 2019-07-01 DIAGNOSIS — M5033 Other cervical disc degeneration, cervicothoracic region: Secondary | ICD-10-CM | POA: Diagnosis not present

## 2019-07-01 DIAGNOSIS — M9903 Segmental and somatic dysfunction of lumbar region: Secondary | ICD-10-CM | POA: Diagnosis not present

## 2019-07-01 DIAGNOSIS — M9901 Segmental and somatic dysfunction of cervical region: Secondary | ICD-10-CM | POA: Diagnosis not present

## 2019-07-02 ENCOUNTER — Encounter
Admission: RE | Admit: 2019-07-02 | Discharge: 2019-07-02 | Disposition: A | Discharge status: Home / Self Care | Payer: Medicare PPO | Source: Ambulatory Visit | Attending: Physician Assistant | Admitting: Physician Assistant

## 2019-07-02 ENCOUNTER — Other Ambulatory Visit: Payer: Self-pay

## 2019-07-02 ENCOUNTER — Other Ambulatory Visit: Payer: Medicare Other

## 2019-07-02 ENCOUNTER — Ambulatory Visit
Admission: RE | Admit: 2019-07-02 | Discharge: 2019-07-02 | Disposition: A | Discharge status: Home / Self Care | Payer: Medicare PPO | Source: Ambulatory Visit | Attending: Physician Assistant | Admitting: Physician Assistant

## 2019-07-02 DIAGNOSIS — Z96653 Presence of artificial knee joint, bilateral: Secondary | ICD-10-CM | POA: Insufficient documentation

## 2019-07-02 MED ORDER — TECHNETIUM TC 99M MEDRONATE IV KIT
20.1500 | PACK | Freq: Once | INTRAVENOUS | Status: AC | PRN
  Administered 2019-07-02: 20.15 via INTRAVENOUS

## 2019-07-20 ENCOUNTER — Other Ambulatory Visit: Payer: Self-pay | Admitting: *Deleted

## 2019-07-20 DIAGNOSIS — C61 Malignant neoplasm of prostate: Secondary | ICD-10-CM

## 2019-07-21 ENCOUNTER — Other Ambulatory Visit: Payer: Medicare PPO

## 2019-07-21 ENCOUNTER — Other Ambulatory Visit: Payer: Medicare Other

## 2019-07-21 ENCOUNTER — Other Ambulatory Visit: Payer: Self-pay

## 2019-07-21 DIAGNOSIS — C61 Malignant neoplasm of prostate: Secondary | ICD-10-CM | POA: Diagnosis not present

## 2019-07-22 LAB — PSA: Prostate Specific Ag, Serum: 9 ng/mL — ABNORMAL HIGH (ref 0.0–4.0)

## 2019-07-24 DIAGNOSIS — Z96653 Presence of artificial knee joint, bilateral: Secondary | ICD-10-CM | POA: Diagnosis not present

## 2019-07-24 DIAGNOSIS — M25562 Pain in left knee: Secondary | ICD-10-CM | POA: Diagnosis not present

## 2019-07-27 NOTE — Progress Notes (Addendum)
07/28/19 9:35 AM   [name] [birth year] TL:[id number]  Referring provider: Leone Haven, MD [address] [place],  [place] [address] Chief Complaint  Patient presents with  . Prostate Cancer    HPI: [name] is a 79 y.o. M who returns today for a 6 month f/u.   He has a personal history of incidental prostate cancer, BPH status post holep and gross hematuria likely related to dystrophic calcifications with incidental urethral stricture.  He has a personal history of BPH status post holep/cystolitholapaxy in 10/2017.  He was doing well but ultimately developed recurrent gross hematuria secondary to dystrophic calcifications and was also found to have a bulbar urethral stricture.  He returned to the operating room on 11/17/2018 for dilation of bulbar urethral stricture.  Foley was left in place for 3 days.  He does also have a personal history of elevated PSA status post previous negative biopsy but found to have incidental prostate cancer at the time of outlet surgery.The surgical pathology showed 6 7 g of resected tissue, with incidental finding of Gleason 3+4, grade group 2 and less than 5% of the tissue. There was also acute and chronic inflammation.  He also has a personal history of elevated PSA status post negative biopsy in 2017.His PSA remains stably elevated, most recently 6.03-2017.Preop prostate volume 120 cc.New baselinePSA is 3.4ng/mL from 11/15/19following surgery. Most recent PSA 5/14 4.6, slightly up from new baseline.  He underwent prostate MRI on 08/19/2018 which showed a nodular transition zone consistent with BPH, PI-RADS 1 and some incidental asymmetry of the seminal vesicle, otherwise no evidence of high-grade lesions.  Newly calculated prostate static volume 56 cc.  He reports of gross hematuria q 2-3 weeks.   He states of taking aspirin daily.    Most recent PSA elevated to 9.0 as of 07/21/19.   IPSS    Row Name 07/28/19 1100         International Prostate Symptom Score   How often have you had the sensation of not emptying your bladder?  Less than 1 in 5     How often have you had to urinate less than every two hours?  Less than half the time     How often have you found you stopped and started again several times when you urinated?  Less than half the time     How often have you found it difficult to postpone urination?  Less than half the time     How often have you had a weak urinary stream?  About half the time     How often have you had to strain to start urination?  Not at All     How many times did you typically get up at night to urinate?  3 Times     Total IPSS Score  13       Quality of Life due to urinary symptoms   If you were to spend the rest of your life with your urinary condition just the way it is now how would you feel about that?  Mostly Satisfied        Score:  1-7 Mild 8-19 Moderate 20-35 Severe   PSA Trend:  Component     Latest Ref Rng & Units 06/24/2017 01/17/2018 07/17/2018 01/27/2019  Prostate Specific Ag, Serum     0.0 - 4.0 ng/mL 6.1 (H) 3.4 4.6 (H) 6.4 (H)   Component     Latest Ref Rng &  Units 07/21/2019  Prostate Specific Ag, Serum     0.0 - 4.0 ng/mL 9.0 (H)    PMH: Past Medical History:  Diagnosis Date  . Allergy   . Anxiety   . Arthritis   . Cataract    beginning stage bilateral  . Elevated PSA   . GERD (gastroesophageal reflux disease)   . Gout   . History of kidney stones   . Hyperlipidemia   . Hypertension   . Inguinal hernia     Surgical History: Past Surgical History:  Procedure Laterality Date  . BACK SURGERY  1980's   L4/5  . BALLOON DILATION N/A 11/17/2018   Procedure: URETHRAL BALLOON DILATION;  Surgeon: Hollice Espy, MD;  Location: [facility];  Service: Urology;  Laterality: N/A;  . CARPAL TUNNEL RELEASE Right 08/15/2016   Procedure: CARPAL TUNNEL RELEASE ENDOSCOPIC;  Surgeon: Corky Mull, MD;  Location: [facility];  Service:  Orthopedics;  Laterality: Right;  . COLONOSCOPY    . CYSTOSCOPY N/A 11/17/2018   Procedure: CYSTOSCOPY;  Surgeon: Hollice Espy, MD;  Location: [facility];  Service: Urology;  Laterality: N/A;  . CYSTOSCOPY WITH LITHOLAPAXY N/A 10/09/2017   Procedure: CYSTOSCOPY WITH LITHOLAPAXY;  Surgeon: Hollice Espy, MD;  Location: [facility];  Service: Urology;  Laterality: N/A;  . ESOPHAGOGASTRODUODENOSCOPY  11-21-06   Esophagitis, Stricture/dilated   . HOLEP-LASER ENUCLEATION OF THE PROSTATE WITH MORCELLATION N/A 10/09/2017   Procedure: HOLEP-LASER ENUCLEATION OF THE PROSTATE WITH MORCELLATION;  Surgeon: Hollice Espy, MD;  Location: [facility];  Service: Urology;  Laterality: N/A;  . LYMPH NODE BIOPSY  1957   benign  lymph node removed  . Right arm fracture    . TONSILLECTOMY    . TOTAL KNEE ARTHROPLASTY  09-02-02   Bilateral  . UPPER GASTROINTESTINAL ENDOSCOPY      Home Medications:  Allergies as of 07/28/2019      Reactions   Penicillins Rash   Did it involve swelling of the face/tongue/throat, SOB, or low BP? No Did it involve sudden or severe rash/hives, skin peeling, or any reaction on the inside of your mouth or nose? No Did you need to seek medical attention at a hospital or doctor's office? No When did it last happen?50 Years If all above answers are "NO", may proceed with cephalosporin use.   Acetaminophen Itching   Aspirin Itching   OK w/low dose aspirin       Medication List       Accurate as of Jul 28, 2019 11:59 PM. If you have any questions, ask your nurse or doctor.        STOP taking these medications   fluocinonide ointment 0.05 % Commonly known as: LIDEX Stopped by: Hollice Espy, MD     TAKE these medications   acyclovir ointment 5 % Commonly known as: ZOVIRAX Apply topically every 3 (three) hours. As needed for cold sores   amLODipine 5 MG tablet Commonly known as: NORVASC Take 1 tablet (5 mg total) by mouth at bedtime.   aspirin 81 MG tablet Take 81  mg by mouth daily.   colchicine 0.6 MG tablet TAKE 1 TABLET (0.6 MG TOTAL) BY MOUTH DAILY AS NEEDED (WHEN HAVING A GOUT FLARE UP).   [employer] Spectravite Senior Tabs Take 1 tablet by mouth daily.   LAMISIL AT ATHLETES FOOT EX   metoprolol succinate 50 MG 24 hr tablet Commonly known as: TOPROL-XL TAKE 1 TABLET BY MOUTH EVERY DAY   mirabegron ER 25 MG Tb24 tablet Commonly  known as: Myrbetriq Take 1 tablet (25 mg total) by mouth daily.   naproxen sodium 220 MG tablet Commonly known as: ALEVE Take 220 mg by mouth daily as needed (pain).   pantoprazole 40 MG tablet Commonly known as: PROTONIX TAKE 1 TABLET BY MOUTH TWICE A DAY What changed: when to take this   TART CHERRY ADVANCED PO Take 425 mg by mouth daily.       Allergies:  Allergies  Allergen Reactions  . Penicillins Rash    Did it involve swelling of the face/tongue/throat, SOB, or low BP? No Did it involve sudden or severe rash/hives, skin peeling, or any reaction on the inside of your mouth or nose? No Did you need to seek medical attention at a hospital or doctor's office? No When did it last happen?50 Years If all above answers are "NO", may proceed with cephalosporin use.   . Acetaminophen Itching  . Aspirin Itching    OK w/low dose aspirin     Family History: Family History  Problem Relation Age of Onset  . Ulcers Father   . Diverticulitis Father   . Arthritis Father   . Hypertension Brother   . Obesity Brother   . Cancer Brother        throat  . Arthritis Mother   . Depression Mother        Anxiety  . Colon cancer Neg Hx   . Colon polyps Neg Hx   . Esophageal cancer Neg Hx   . Rectal cancer Neg Hx   . Stomach cancer Neg Hx   . Kidney disease Neg Hx   . Kidney cancer Neg Hx   . Prostate cancer Neg Hx     Social History:  reports that he has never smoked. He has never used smokeless tobacco. He reports that he does not drink alcohol or use drugs.   Physical Exam: BP (!) 172/82    Pulse 63   Ht 5\' 9"  (1.753 m)   Wt 255 lb (115.7 kg)   BMI 37.66 kg/m   Constitutional:  Alert and oriented, No acute distress. HEENT: [place] AT, moist mucus membranes.  Trachea midline, no masses. Cardiovascular: No clubbing, cyanosis, or edema. Respiratory: Normal respiratory effort, no increased work of breathing. Skin: No rashes, bruises or suspicious lesions. Neurologic: Grossly intact, no focal deficits, moving all 4 extremities. Psychiatric: Normal mood and affect.  Laboratory Data:  Urinalysis UA revealed microscopic blood.   Pertinent Imaging: Results for orders placed or performed in visit on 07/28/19  Microscopic Examination   URINE  Result Value Ref Range   WBC, UA 0-5 0 - 5 /hpf   RBC >30 (A) 0 - 2 /hpf   Epithelial Cells (non renal) 0-10 0 - 10 /hpf   Casts Present (A) None seen /lpf   Cast Type Hyaline casts N/A   Bacteria, UA Few None seen/Few  Urinalysis, Complete  Result Value Ref Range   Specific Gravity, UA 1.020 1.005 - 1.030   pH, UA 5.0 5.0 - 7.5   Color, UA Yellow Yellow   Appearance Ur Hazy (A) Clear   Leukocytes,UA Negative Negative   Protein,UA 1+ (A) Negative/Trace   Glucose, UA Negative Negative   Ketones, UA Trace (A) Negative   RBC, UA 2+ (A) Negative   Bilirubin, UA Negative Negative   Urobilinogen, Ur 0.2 0.2 - 1.0 mg/dL   Nitrite, UA Negative Negative   Microscopic Examination See below:   BLADDER SCAN AMB NON-IMAGING  Result Value Ref  Range   Scan Result 0 ML    Assessment & Plan:    1. Prostate cancer  Incidental Gleason 3+4 prostate cancer on holep Has been managed with active surveillance given very low volume of incidental malignancy however in the setting of rising PSA, some concern that there may be some progression of disease Most recent PSA elevated to 9.0 as of 07/21/19  Recommended prostate bx  We discussed prostate biopsy in detail including the procedure itself, the risks of blood in the urine, stool, and ejaculate,  serious infection, and discomfort. He is willing to proceed with this as discussed.  2. Gross hematuria Chronic issue w/ no recent hematuria workup We discussed the differential diagnosis for microscopic hematuria including nephrolithiasis, renal or upper tract tumors, bladder stones, UTIs, or bladder tumors as well as undetermined etiologies. Per AUA guidelines, I did recommend complete microscopic hematuria evaluation including RUS, possible urine cytology, and office cystoscopy. Will undergo cysto at time of prostate bx.   Return for cysto/prostate bx w/ [facility] [address] [place], [place] [address] [phone]  I, Lucas Mallow, am acting as a scribe for Dr. Hollice Espy,

## 2019-07-28 ENCOUNTER — Ambulatory Visit: Payer: Medicare PPO | Admitting: Urology

## 2019-07-28 ENCOUNTER — Other Ambulatory Visit: Payer: Self-pay

## 2019-07-28 VITALS — BP 172/82 | HR 63 | Ht 69.0 in | Wt 255.0 lb

## 2019-07-28 DIAGNOSIS — R310 Gross hematuria: Secondary | ICD-10-CM

## 2019-07-28 DIAGNOSIS — R972 Elevated prostate specific antigen [PSA]: Secondary | ICD-10-CM | POA: Diagnosis not present

## 2019-07-28 DIAGNOSIS — C61 Malignant neoplasm of prostate: Secondary | ICD-10-CM | POA: Diagnosis not present

## 2019-07-28 LAB — BLADDER SCAN AMB NON-IMAGING: Scan Result: 0

## 2019-07-28 NOTE — Patient Instructions (Signed)
Prostate Biopsy Instructions  Stop all aspirin or blood thinners (aspirin, plavix, coumadin, warfarin, motrin, ibuprofen, advil, aleve, naproxen, naprosyn) for 7 days prior to the procedure.  If you have any questions about stopping these medications, please contact your primary care physician or cardiologist.  Having a light meal prior to the procedure is recommended.  If you are diabetic or have low blood sugar please bring a small snack or glucose tablet.  A Fleets enema is needed to be purchased over the counter at a local pharmacy and used 2 hours before you scheduled appointment.  This can be purchased over the counter at any pharmacy.  Antibiotics will be administered in the clinic at the time of the procedure unless otherwise specified.    Please bring someone with you to the procedure to drive you home.  A follow up appointment has been scheduled for you to receive the results of the biopsy.  If you have any questions or concerns, please feel free to call the office at [phone] or send a Mychart message.    Thank you, Staff at [facility]    Cystoscopy Cystoscopy is a procedure that is used to help diagnose and sometimes treat conditions that affect the lower urinary tract. The lower urinary tract includes the bladder and the urethra. The urethra is the tube that drains urine from the bladder. Cystoscopy is done using a thin, tube-shaped instrument with a light and camera at the end (cystoscope). The cystoscope may be hard or flexible, depending on the goal of the procedure. The cystoscope is inserted through the urethra, into the bladder. Cystoscopy may be recommended if you have:  Urinary tract infections that keep coming back.  Blood in the urine (hematuria).  An inability to control when you urinate (urinary incontinence) or an overactive bladder.  Unusual cells found in a urine sample.  A blockage in the urethra, such as a urinary stone.  Painful  urination.  An abnormality in the bladder found during an intravenous pyelogram (IVP) or CT scan. Cystoscopy may also be done to remove a sample of tissue to be examined under a microscope (biopsy). What are the risks? Generally, this is a safe procedure. However, problems may occur, including:  Infection.  Bleeding.  What happens during the procedure?  1. You will be given one or more of the following: ? A medicine to numb the area (local anesthetic). 2. The area around the opening of your urethra will be cleaned. 3. The cystoscope will be passed through your urethra into your bladder. 4. Germ-free (sterile) fluid will flow through the cystoscope to fill your bladder. The fluid will stretch your bladder so that your health care provider can clearly examine your bladder walls. 5. Your doctor will look at the urethra and bladder. 6. The cystoscope will be removed The procedure may vary among health care providers  What can I expect after the procedure? After the procedure, it is common to have: 1. Some soreness or pain in your abdomen and urethra. 2. Urinary symptoms. These include: ? Mild pain or burning when you urinate. Pain should stop within a few minutes after you urinate. This may last for up to 1 week. ? A small amount of blood in your urine for several days. ? Feeling like you need to urinate but producing only a small amount of urine. Follow these instructions at home: General instructions  Return to your normal activities as told by your health care provider.   Do not  drive for 24 hours if you were given a sedative during your procedure.  Watch for any blood in your urine. If the amount of blood in your urine increases, call your health care provider.  If a tissue sample was removed for testing (biopsy) during your procedure, it is up to you to get your test results. Ask your health care provider, or the department that is doing the test, when your results will be  ready.  Drink enough fluid to keep your urine pale yellow.  Keep all follow-up visits as told by your health care provider. This is important. Contact a health care provider if you:  Have pain that gets worse or does not get better with medicine, especially pain when you urinate.  Have trouble urinating.  Have more blood in your urine. Get help right away if you:  Have blood clots in your urine.  Have abdominal pain.  Have a fever or chills.  Are unable to urinate. Summary  Cystoscopy is a procedure that is used to help diagnose and sometimes treat conditions that affect the lower urinary tract.  Cystoscopy is done using a thin, tube-shaped instrument with a light and camera at the end.  After the procedure, it is common to have some soreness or pain in your abdomen and urethra.  Watch for any blood in your urine. If the amount of blood in your urine increases, call your health care provider.  If you were prescribed an antibiotic medicine, take it as told by your health care provider. Do not stop taking the antibiotic even if you start to feel better. This information is not intended to replace advice given to you by your health care provider. Make sure you discuss any questions you have with your health care provider. Document Revised: 02/11/2018 Document Reviewed: 02/11/2018 Elsevier Patient Education  [employer].

## 2019-07-29 LAB — URINALYSIS, COMPLETE
Bilirubin, UA: NEGATIVE
Glucose, UA: NEGATIVE
Leukocytes,UA: NEGATIVE
Nitrite, UA: NEGATIVE
Specific Gravity, UA: 1.02 (ref 1.005–1.030)
Urobilinogen, Ur: 0.2 mg/dL (ref 0.2–1.0)
pH, UA: 5 (ref 5.0–7.5)

## 2019-07-29 LAB — MICROSCOPIC EXAMINATION: RBC, Urine: >30 /hpf — AB (ref 0–2)

## 2019-07-31 ENCOUNTER — Other Ambulatory Visit: Payer: Self-pay | Admitting: Family Medicine

## 2019-07-31 DIAGNOSIS — M109 Gout, unspecified: Secondary | ICD-10-CM

## 2019-07-31 NOTE — Telephone Encounter (Signed)
Pharmacy is requesting a change in medication and dosage. Please advise.

## 2019-07-31 NOTE — Telephone Encounter (Signed)
Sent to pharmacy 

## 2019-08-08 ENCOUNTER — Encounter: Payer: Self-pay | Admitting: Family Medicine

## 2019-08-08 ENCOUNTER — Encounter: Payer: Self-pay | Admitting: Urology

## 2019-08-10 DIAGNOSIS — M9901 Segmental and somatic dysfunction of cervical region: Secondary | ICD-10-CM | POA: Diagnosis not present

## 2019-08-10 DIAGNOSIS — M9903 Segmental and somatic dysfunction of lumbar region: Secondary | ICD-10-CM | POA: Diagnosis not present

## 2019-08-10 DIAGNOSIS — M5136 Other intervertebral disc degeneration, lumbar region: Secondary | ICD-10-CM | POA: Diagnosis not present

## 2019-08-10 DIAGNOSIS — M5033 Other cervical disc degeneration, cervicothoracic region: Secondary | ICD-10-CM | POA: Diagnosis not present

## 2019-08-10 NOTE — Telephone Encounter (Signed)
Called patient, gave phone number for scheduling to schedule RUS. Patient voiced understanding.

## 2019-08-11 NOTE — Progress Notes (Signed)
08/12/19  Chief Complaint  Patient presents with  . Prostate Biopsy  . Cysto     HPI: 79 year old male with hx of prostate cancer and gross hematuria returnstoday to the office for cystoscopy/prostate biopsy.    Please see previous notes for details.     Blood pressure (!) 148/82. NED. A&Ox3.   No respiratory distress   Abd soft, NT, ND Normal phallus with bilateral descended testicles    Cystoscopy Procedure Note  Patient identification was confirmed, informed consent was obtained, and patient was prepped using Betadine solution.  Lidocaine jelly was administered per urethral meatus.    Preoperative abx where received prior to procedure.     Pre-Procedure: - Inspection reveals a normal caliber ureteral meatus.  Procedure: The flexible cystoscope was introduced without difficulty - No urethral strictures/lesions are present. - Enlarged prostate w/ regrowth on right lateral wall of prostatic fossa, some deviation related to hold-up and bleeding with manipulation appreciated. - Elevated bladder neck - Bilateral ureteral orifices identified - Bladder mucosa  reveals no ulcers, tumors, or lesions - No bladder stones - Mildly trabeculation  Retroflexion unremarkable.   Post-Procedure: - Patient tolerated the procedure well   Prostate Biopsy Procedure   Informed consent was obtained after discussing risks/benefits of the procedure.  A time out was performed to ensure correct patient identity.  Pre-Procedure: - Last PSA Level: 9.0 Lab Results  Component Value Date   PSA 5.45 (H) 07/20/2015   PSA 6.58 (H) 07/16/2014   PSA 7.50 (H) 06/30/2013   - Gentamicin given prophylactically - Levaquin 500 mg administered PO -Transrectal Ultrasound performed revealing a 70 gm prostate (likely overrepresented)  -No significant hypoechoic or median lobe noted  Procedure: - Prostate block performed using 10 cc 1% lidocaine and biopsies taken from sextant areas, a total of 12  under ultrasound guidance.  Post-Procedure: - Patient tolerated the procedure well - He was counseled to seek immediate medical attention if experiences any severe pain, significant bleeding, or fevers - Return in one week to discuss biopsy results  Assessment/ Plan:  1. Hx of Prostate cancer  IncidentalGleason 3+4 prostate cancer on HoLEP Most recent PSA elevated to 9.0 as of 07/21/19  Prostate bx today  Return in 1 week for bx results   2. Gross hematuria RUS today - pending  Cystoscopy today with significant right lateral prostatic regrowth of friable mucosa, likely source of intermittent bleeding Urinary symptoms, will discuss further at follow-up visit  I, Nethusan Sivanesan, am acting as a scribe for Dr. Hollice Espy,  I have reviewed the above documentation for accuracy and completeness, and I agree with the above.   Hollice Espy, MD

## 2019-08-12 ENCOUNTER — Ambulatory Visit: Payer: Medicare PPO | Admitting: Urology

## 2019-08-12 ENCOUNTER — Other Ambulatory Visit: Payer: Self-pay | Admitting: Urology

## 2019-08-12 ENCOUNTER — Encounter: Payer: Self-pay | Admitting: Urology

## 2019-08-12 ENCOUNTER — Other Ambulatory Visit: Payer: Self-pay

## 2019-08-12 VITALS — BP 148/82

## 2019-08-12 DIAGNOSIS — R631 Polydipsia: Secondary | ICD-10-CM | POA: Diagnosis not present

## 2019-08-12 DIAGNOSIS — C61 Malignant neoplasm of prostate: Secondary | ICD-10-CM | POA: Diagnosis not present

## 2019-08-12 DIAGNOSIS — R310 Gross hematuria: Secondary | ICD-10-CM

## 2019-08-12 MED ORDER — LEVOFLOXACIN 500 MG PO TABS
500.0000 mg | ORAL_TABLET | Freq: Once | ORAL | Status: AC
  Administered 2019-08-12: 500 mg via ORAL

## 2019-08-12 MED ORDER — GENTAMICIN SULFATE 40 MG/ML IJ SOLN
80.0000 mg | Freq: Once | INTRAMUSCULAR | Status: AC
  Administered 2019-08-12: 80 mg via INTRAMUSCULAR

## 2019-08-13 LAB — URINALYSIS, COMPLETE
Bilirubin, UA: NEGATIVE
Glucose, UA: NEGATIVE
Ketones, UA: NEGATIVE
Leukocytes,UA: NEGATIVE
Nitrite, UA: NEGATIVE
Specific Gravity, UA: 1.025 (ref 1.005–1.030)
Urobilinogen, Ur: 0.2 mg/dL (ref 0.2–1.0)
pH, UA: 5 (ref 5.0–7.5)

## 2019-08-13 LAB — MICROSCOPIC EXAMINATION
Bacteria, UA: NONE SEEN
RBC, Urine: >30 /hpf — AB (ref 0–2)

## 2019-08-14 ENCOUNTER — Encounter: Payer: Self-pay | Admitting: Family Medicine

## 2019-08-14 ENCOUNTER — Ambulatory Visit: Payer: Medicare PPO | Admitting: Family Medicine

## 2019-08-14 ENCOUNTER — Other Ambulatory Visit: Payer: Self-pay

## 2019-08-14 DIAGNOSIS — I10 Essential (primary) hypertension: Secondary | ICD-10-CM

## 2019-08-14 DIAGNOSIS — C61 Malignant neoplasm of prostate: Secondary | ICD-10-CM

## 2019-08-14 DIAGNOSIS — K219 Gastro-esophageal reflux disease without esophagitis: Secondary | ICD-10-CM

## 2019-08-14 NOTE — Assessment & Plan Note (Signed)
PSA has trended up.  He is awaiting biopsy results.  He will continue to see urology.

## 2019-08-14 NOTE — Assessment & Plan Note (Signed)
Well-controlled.  Continue Protonix.

## 2019-08-14 NOTE — Assessment & Plan Note (Signed)
Well-controlled today.  Borderline at home.  He will check his blood pressure daily for the next week and let us know what his readings are.  We will continue current medication.  If his blood pressure is above goal at home we could increase his amlodipine.

## 2019-08-14 NOTE — Progress Notes (Signed)
  Tommi Rumps, MD Phone: [phone]  [name] is a 79 y.o. male who presents today for f/u.  HYPERTENSION  Disease Monitoring  Home BP Monitoring 140-148/80 Chest pain- no    Dyspnea- no Medications  Compliance-  Taking amlodipine, metoprolol  Edema- no  GERD:   Reflux symptoms: no   Abd pain: no   Blood in stool: no  Dysphagia: no   EGD: in the past  Medication: taking protonix  History of prostate cancer: Recently had a prostate biopsy.  His PSA is gone up to 9.  He is been having blood in his urine several times a week.  He notes the urologist felt as though he may be passing small kidney stones.  He also had a reassuring cystoscopy.     Social History   Tobacco Use  Smoking Status Never Smoker  Smokeless Tobacco Never Used     ROS see history of present illness  Objective  Physical Exam Vitals:   08/14/19 1012  BP: 110/80  Pulse: (!) 57  Temp: (!) 96.8 F (36 C)  SpO2: 96%    BP Readings from Last 3 Encounters:  08/14/19 110/80  08/12/19 (!) 148/82  07/28/19 (!) 172/82   Wt Readings from Last 3 Encounters:  08/14/19 255 lb 3.2 oz (115.8 kg)  07/28/19 255 lb (115.7 kg)  02/11/19 254 lb (115.2 kg)    Physical Exam Constitutional:      General: He is not in acute distress.    Appearance: He is not diaphoretic.  Cardiovascular:     Rate and Rhythm: Normal rate and regular rhythm.     Heart sounds: Normal heart sounds.  Pulmonary:     Effort: Pulmonary effort is normal.     Breath sounds: Normal breath sounds.  Musculoskeletal:     Right lower leg: No edema.     Left lower leg: No edema.  Skin:    General: Skin is warm and dry.  Neurological:     Mental Status: He is alert.      Assessment/Plan: Please see individual problem list.  Essential hypertension Well-controlled today.  Borderline at home.  He will check his blood pressure daily for the next week and let us know what his readings are.  We will continue current  medication.  If his blood pressure is above goal at home we could increase his amlodipine.  GERD (gastroesophageal reflux disease) Well-controlled.  Continue Protonix.  Prostate cancer ([place]) PSA has trended up.  He is awaiting biopsy results.  He will continue to see urology.   No orders of the defined types were placed in this encounter.   No orders of the defined types were placed in this encounter.   This visit occurred during the SARS-CoV-2 public health emergency.  Safety protocols were in place, including screening questions prior to the visit, additional usage of staff PPE, and extensive cleaning of exam room while observing appropriate contact time as indicated for disinfecting solutions.    Tommi Rumps, MD [place]

## 2019-08-14 NOTE — Patient Instructions (Signed)
Nice to see you. Please check your blood pressure daily for the next week and send [place] your readings.

## 2019-08-17 ENCOUNTER — Ambulatory Visit
Admission: RE | Admit: 2019-08-17 | Discharge: 2019-08-17 | Disposition: A | Discharge status: Home / Self Care | Payer: Medicare PPO | Source: Ambulatory Visit | Attending: Urology | Admitting: Urology

## 2019-08-17 ENCOUNTER — Other Ambulatory Visit: Payer: Self-pay

## 2019-08-17 DIAGNOSIS — R310 Gross hematuria: Secondary | ICD-10-CM | POA: Diagnosis not present

## 2019-08-17 DIAGNOSIS — C61 Malignant neoplasm of prostate: Secondary | ICD-10-CM | POA: Diagnosis not present

## 2019-08-17 DIAGNOSIS — R972 Elevated prostate specific antigen [PSA]: Secondary | ICD-10-CM

## 2019-08-17 LAB — SURGICAL PATHOLOGY

## 2019-08-17 NOTE — Progress Notes (Addendum)
08/18/19 11:49 AM   [name] 1940/11/10 [id number]  Referring provider: Leone Haven, MD [address] [place],  [place] [address] No chief complaint on file.   HPI: [name] is a 79 y.o. M who returns today for f/u prostate bx results.   He has a personal history of incidental prostate cancer, BPH status post holep and gross hematuria likely related to dystrophic calcifications with incidental urethral stricture.  He has a personal history of BPH status post holep/cystolitholapaxy in 10/2017. He was doing well but ultimately developed recurrent gross hematuria secondary to dystrophic calcifications and was also found to have a bulbar urethral stricture. He returned to the operating room on 11/17/2018 for dilation of bulbar urethral stricture. Foley was left in place for 3 days.  He does also have a personal history of elevated PSA status post previous negative biopsy but found to have incidental prostate cancer at the time of outlet surgery.The surgical pathology showed 6 7 g of resected tissue, with incidental finding of Gleason 3+4, grade group 2 and less than 5% of the tissue. There was also acute and chronic inflammation.  He also has a personal history of elevated PSA status post negative biopsy in 2017.His PSA remains stably elevated, most recently 6.03-2017.Preop prostate volume 120 cc.New baselinePSA is 3.4ng/mL from 11/15/19following surgery. Most recent PSA 5/14 4.6, slightly up from new baseline.  He underwent prostate MRI on 08/19/2018 which showed a nodular transition zone consistent with BPH, PI-RADS 1 and some incidental asymmetry of the seminal vesicle, otherwise no evidence of high-grade lesions. Newly calculated prostatic volume 56 cc.  He states of taking aspirin daily.   Most recent PSA elevated to 9.0 as of 07/21/19.   He had a prostate bx on 08/12/19. His path report indicates 2 of 12 cores involved low volume Gleason 3+3 up to 6% of  tissue primarily at right mid and right apex. Previous cysto revealed regrowth of right lobe only which was also appreciated on TRUS. TRUS 70 g.  He reports of hematuria post prostate bx.    PMH: Past Medical History:  Diagnosis Date  . Allergy   . Anxiety   . Arthritis   . Cataract    beginning stage bilateral  . Elevated PSA   . GERD (gastroesophageal reflux disease)   . Gout   . History of kidney stones   . Hyperlipidemia   . Hypertension   . Inguinal hernia     Surgical History: Past Surgical History:  Procedure Laterality Date  . BACK SURGERY  1980's   L4/5  . BALLOON DILATION N/A 11/17/2018   Procedure: URETHRAL BALLOON DILATION;  Surgeon: Hollice Espy, MD;  Location: [facility];  Service: Urology;  Laterality: N/A;  . CARPAL TUNNEL RELEASE Right 08/15/2016   Procedure: CARPAL TUNNEL RELEASE ENDOSCOPIC;  Surgeon: Corky Mull, MD;  Location: [facility];  Service: Orthopedics;  Laterality: Right;  . COLONOSCOPY    . CYSTOSCOPY N/A 11/17/2018   Procedure: CYSTOSCOPY;  Surgeon: Hollice Espy, MD;  Location: [facility];  Service: Urology;  Laterality: N/A;  . CYSTOSCOPY WITH LITHOLAPAXY N/A 10/09/2017   Procedure: CYSTOSCOPY WITH LITHOLAPAXY;  Surgeon: Hollice Espy, MD;  Location: [facility];  Service: Urology;  Laterality: N/A;  . ESOPHAGOGASTRODUODENOSCOPY  11-21-06   Esophagitis, Stricture/dilated   . HOLEP-LASER ENUCLEATION OF THE PROSTATE WITH MORCELLATION N/A 10/09/2017   Procedure: HOLEP-LASER ENUCLEATION OF THE PROSTATE WITH MORCELLATION;  Surgeon: Hollice Espy, MD;  Location: [facility];  Service: Urology;  Laterality: N/A;  . LYMPH NODE BIOPSY  1957   benign  lymph node removed  . Right arm fracture    . TONSILLECTOMY    . TOTAL KNEE ARTHROPLASTY  09-02-02   Bilateral  . UPPER GASTROINTESTINAL ENDOSCOPY      Home Medications:  Allergies as of 08/18/2019      Reactions   Penicillins Rash   Did it involve swelling of the face/tongue/throat, SOB, or low  BP? No Did it involve sudden or severe rash/hives, skin peeling, or any reaction on the inside of your mouth or nose? No Did you need to seek medical attention at a hospital or doctor's office? No When did it last happen?50 Years If all above answers are "NO", may proceed with cephalosporin use.   Acetaminophen Itching   Aspirin Itching   OK w/low dose aspirin       Medication List       Accurate as of August 17, 2019 11:49 AM. If you have any questions, ask your nurse or doctor.        acyclovir ointment 5 % Commonly known as: ZOVIRAX Apply topically every 3 (three) hours. As needed for cold sores   amLODipine 5 MG tablet Commonly known as: NORVASC Take 1 tablet (5 mg total) by mouth at bedtime.   aspirin 81 MG tablet Take 81 mg by mouth daily.   [employer] Spectravite Senior Tabs Take 1 tablet by mouth daily.   metoprolol succinate 50 MG 24 hr tablet Commonly known as: TOPROL-XL TAKE 1 TABLET BY MOUTH EVERY DAY   naproxen sodium 220 MG tablet Commonly known as: ALEVE Take 220 mg by mouth daily as needed (pain).   pantoprazole 40 MG tablet Commonly known as: PROTONIX TAKE 1 TABLET BY MOUTH TWICE A DAY What changed: when to take this   TART CHERRY ADVANCED PO Take 425 mg by mouth daily.       Allergies:  Allergies  Allergen Reactions  . Penicillins Rash    Did it involve swelling of the face/tongue/throat, SOB, or low BP? No Did it involve sudden or severe rash/hives, skin peeling, or any reaction on the inside of your mouth or nose? No Did you need to seek medical attention at a hospital or doctor's office? No When did it last happen?50 Years If all above answers are "NO", may proceed with cephalosporin use.   . Acetaminophen Itching  . Aspirin Itching    OK w/low dose aspirin     Family History: Family History  Problem Relation Age of Onset  . Ulcers Father   . Diverticulitis Father   . Arthritis Father   . Hypertension Brother   . Obesity  Brother   . Cancer Brother        throat  . Arthritis Mother   . Depression Mother        Anxiety  . Colon cancer Neg Hx   . Colon polyps Neg Hx   . Esophageal cancer Neg Hx   . Rectal cancer Neg Hx   . Stomach cancer Neg Hx   . Kidney disease Neg Hx   . Kidney cancer Neg Hx   . Prostate cancer Neg Hx     Social History:  reports that he has never smoked. He has never used smokeless tobacco. He reports that he does not drink alcohol and does not use drugs.   Physical Exam: There were no vitals taken for this visit.  Constitutional:  Alert and oriented,  No acute distress. HEENT: [place] AT, moist mucus membranes.  Trachea midline, no masses. Cardiovascular: No clubbing, cyanosis, or edema. Respiratory: Normal respiratory effort, no increased work of breathing. Skin: No rashes, bruises or suspicious lesions. Neurologic: Grossly intact, no focal deficits, moving all 4 extremities. Psychiatric: Normal mood and affect.  Laboratory Data: SURGICAL PATHOLOGY  CASE: [id number]  PATIENT: Guinevere Scarlet  Surgical Pathology Report      Specimen Submitted:  A. Prostate, left base  B. Prostate, left mid  C. Prostate, left apex  D. Prostate, right base  E. Prostate, right mid  F. Prostate, right apex  G. Prostate, left lateral base  H. Prostate, left lateral mid  I. Prostate, left lateral apex  J. Prostate, right lateral base  K. Prostate, right lateral mid  L. Prostate, right lateral apex   Clinical History: History of incidental Gleason 3+4 prostate cancer on  HoLEP; most recent PSA elevated to 9.0 as of 07/21/19       DIAGNOSIS:  A. PROSTATE, LEFT BASE; CORE BIOPSY:  - BENIGN FOCALLY ATROPHIC PROSTATIC GLANDS AND REACTIVE FIBROMUSCULAR  STROMA.  - NEGATIVE FOR HIGH-GRADE PIN AND MALIGNANCY.   B. PROSTATE, LEFT MID; CORE BIOPSY:  - BENIGN FOCALLY ATROPHIC PROSTATIC GLANDS AND REACTIVE FIBROMUSCULAR  STROMA.  - NEGATIVE FOR HIGH-GRADE PIN AND MALIGNANCY.   C.  PROSTATE, LEFT APEX; CORE BIOPSY:  - BENIGN FOCALLY ATROPHIC PROSTATIC GLANDS AND REACTIVE FIBROMUSCULAR  STROMA.  - CHANGES CONSISTENT WITH PRIOR SURGICAL PROCEDURE.  - NEGATIVE FOR HIGH-GRADE PIN AND MALIGNANCY.   D. PROSTATE, RIGHT BASE; CORE BIOPSY:  - BENIGN FOCALLY ATROPHIC PROSTATIC GLANDS AND REACTIVE FIBROMUSCULAR  STROMA.  - NEGATIVE FOR HIGH-GRADE PIN AND MALIGNANCY.   E. PROSTATE, RIGHT MID; CORE BIOPSY:  - MINUTE FOCUS OF ACINAR ADENOCARCINOMA OF THE PROSTATE.  - GLEASON 3+3=6 (GRADE GROUP 1).  - TUMOR INVOLVES 1 OF 2 CORES, MEASURING 0.5 MM OF 9.9 MM CORE LENGTH  (5%).  - PIN3 STAIN CONFIRMS.  - BACKGROUND ATROPHIC PROSTATIC GLANDS AND REACTIVE FIBROMUSCULAR  STROMA.   F. PROSTATE, RIGHT APEX; CORE BIOPSY:  - TWO MINUTE FOCI OF ACINAR ADENOCARCINOMA OF THE PROSTATE.  - GLEASON 3+3=6 (GRADE GROUP 1).  - DISCONTINUOUS FOCI MEASURE 0.9 MM IN AGGREGATE, INVOLVING 6% OF THE  CORE LENGTH, AND SPANNING 40% OF THE LENGTH OF THE CORE.  - PIN3 STAIN CONFIRMS.  - BACKGROUND ATROPHIC PROSTATIC GLANDS AND REACTIVE FIBROMUSCULAR  STROMA.   G. PROSTATE, LEFT LATERAL BASE; CORE BIOPSY:  - BENIGN FOCALLY ATROPHIC PROSTATIC GLANDS AND STROMA.  - NEGATIVE FOR HIGH-GRADE PIN AND MALIGNANCY.   H. PROSTATE, LEFT LATERAL MID; CORE BIOPSY:  - BENIGN FOCALLY ATROPHIC PROSTATIC GLANDS AND STROMA.  - NEGATIVE FOR HIGH-GRADE PIN AND MALIGNANCY.   I. PROSTATE, LEFT LATERAL APEX; CORE BIOPSY:  - FIBROMUSCULAR STROMA WITH CHANGES CONSISTENT WITH PRIOR SURGICAL  PROCEDURE.  - PROSTATIC GLANDULAR TISSUE IS NOT PRESENT FOR EVALUATION.   J. PROSTATE, RIGHT LATERAL BASE; CORE BIOPSY:  - BENIGN FOCALLY ATROPHIC PROSTATIC GLANDS AND REACTIVE FIBROMUSCULAR  STROMA.  - NEGATIVE FOR HIGH-GRADE PIN AND MALIGNANCY.   K. PROSTATE, RIGHT LATERAL MID; CORE BIOPSY:  - BENIGN FOCALLY ATROPHIC PROSTATIC GLANDS AND REACTIVE FIBROMUSCULAR  STROMA.  - NEGATIVE FOR HIGH-GRADE PIN AND MALIGNANCY.     L. PROSTATE, RIGHT LATERAL APEX; CORE BIOPSY:  - BENIGN FOCALLY ATROPHIC PROSTATIC GLANDS AND REACTIVE FIBROMUSCULAR  STROMA.  - NEGATIVE FOR HIGH-GRADE PIN AND MALIGNANCY.   Comment:  The patient's prior TURP specimen ([id number]) was reviewed  in  conjunction with this case. The foci of prostatic adenocarcinoma  identified in parts E and F above are morphologically similar to the  carcinoma identified in the prior TURP specimen.   IHC slides were prepared by [facility], [place], [place]. All controls stained appropriately.   This test was developed and its performance characteristics determined  by [facility]. It has not been cleared or approved by the [place] Food and Drug  Administration. The FDA does not require this test to go through  premarket FDA review. This test is used for clinical purposes. It should  not be regarded as investigational or for research. This laboratory is  certified under the Clinical Laboratory Improvement Amendments (CLIA) as  qualified to perform high complexity clinical laboratory testing.     GROSS DESCRIPTION:  A. Labeled: Left base  Received: Formalin  Number of needle core biopsy(s): 1  Length: 1.0 cm  Diameter: 0.1 cm  Description: Needle core biopsy fragment of tan soft tissue  Ink: Black  Entirely submitted in 1 cassette.   B. Labeled: Left mid  Received: Formalin  Number of needle core biopsy(s): 1  Length: 1.4 cm  Diameter: 0.1 cm  Description: Needle core biopsy fragment of tan soft tissue  Ink: Black  Entirely submitted in 1 cassette.   C. Labeled: Left apex  Received: Formalin  Number of needle core biopsy(s): 1  Length: 1.1 cm  Diameter: 0.1 cm  Description: Needle core biopsy fragment of tan soft tissue  Ink: Black  Entirely submitted in 1 cassette.   D. Labeled: Right base  Received: Formalin  Number of needle core biopsy(s): 2  Length: 0.6-0.9 cm  Diameter: 0.1 cm  Description: Needle  core biopsy fragment of tan soft tissue  Ink: Black  Entirely submitted in 1 cassette.   E. Labeled: Right mid  Received: Formalin  Number of needle core biopsy(s): 2  Length: 1.0-1.5 cm  Diameter: 0.1 cm  Description: Needle core biopsy fragmentof tan soft tissue  Ink: Black  Entirely submitted in 1 cassette.   F. Labeled: Right apex  Received: Formalin  Number of needle core biopsy(s): 1  Length: 1.5 cm  Diameter: 0.1 cm  Description: Needle core biopsy fragment of tan soft tissue  Ink: Black  Entirely submitted in 1 cassette.   G. Labeled: Left lateral base  Received: Formalin  Number of needle core biopsy(s): 1  Length: 1.2 cm  Diameter: 0.1 cm  Description: Needle core biopsy fragment of tan soft tissue  Ink: Black  Entirely submitted in 1 cassette.   H. Labeled: Left lateral mid  Received: Formalin  Number of needle core biopsy(s): 1  Length: 1.0 cm  Diameter: 0.1 cm  Description: Needle core biopsy fragment of tan soft tissue  Ink: Black  Entirely submitted in 1 cassette.   I. Labeled: Left lateral apex  Received: Formalin  Number of needle core biopsy(s): 1  Length: 0.5 cm  Diameter: 0.1 cm  Description: Needle core biopsy fragment of tan soft tissue  Ink: Black  Entirely submitted in 1 cassette.   J. Labeled: Right lateral base  Received: Formalin  Number of needle core biopsy(s): 1  Length: 2.0 cm  Diameter: 0.1 cm  Description: Needle core biopsy fragment of tan soft tissue  Ink: Black  Entirely submitted in 1 cassette.   K. Labeled: Right lateral mid  Received: Formalin  Number of needle core biopsy(s): 2  Length: 0.5-0.7 cm  Diameter: 0.1 cm  Description: Needle core biopsy fragment of tan soft tissue  Ink: Black  Entirely submitted in 1 cassette.   L. Labeled: Right lateral apex  Received: Formalin  Number of needle core biopsy(s): 1  Length: 1.5 cm  Diameter: 0.1 cm  Description: Needle core biopsy fragment of tan soft tissue    Ink: Black  Entirely submitted in 1 cassette.    Final Diagnosis performed by Quay Burow, MD.  Electronically signed  08/17/2019 12:49:34PM  The electronic signature indicates that the named Attending Pathologist  has evaluated the specimen  Technical component performed at [facility], [address] [place],  [place] [address] Lab: [phone] Dir: Rush Farmer, MD, MMM  Professional component performed at [facility], [facility], [place], [place], [place] [address] Lab: [phone]  Dir: Dellia Nims. Reuel Derby, MD   Assessment & Plan:    1. Prostate cancer Most recent prostate bx consistent w/ low volume low risk prostate cancer  Recommend continued surveillance w/ closer interval followup in setting of rising PSA Previous prostate MRI reassuring 2020 urinary symptoms are well controlled thus no plan for additional intervention based on right prostatic regrowth unless he becomes symptomatic  2. Gross hematuria likely secondary to prostatic regrowth cystoscopy with right prostatic hypertrophy, friable mucosa renal ultrasound reassuring  3.  BPH status post holep with right prostatic regrowth remains fairly asymptomatic, will not treat unless he becomes symptomatic as above  Return in 6 months w/ PSA/IPSS   [facility] [address] [place], [place] [address] [phone]  I, Lucas Mallow, am acting as a scribe for Dr. Hollice Espy,  I have reviewed the above documentation for accuracy and completeness, and I agree with the above.   Hollice Espy, MD  I spent 30 total minutes on the day of the encounter including pre-visit review of the medical record, face-to-face time with the patient, and post visit ordering of labs/imaging/tests.

## 2019-08-18 ENCOUNTER — Ambulatory Visit: Payer: Medicare PPO | Admitting: Urology

## 2019-08-18 ENCOUNTER — Encounter: Payer: Self-pay | Admitting: Urology

## 2019-08-18 VITALS — BP 186/74 | HR 94

## 2019-08-18 DIAGNOSIS — C61 Malignant neoplasm of prostate: Secondary | ICD-10-CM | POA: Diagnosis not present

## 2019-08-20 LAB — PATHOLOGY

## 2019-08-27 ENCOUNTER — Other Ambulatory Visit: Payer: Self-pay | Admitting: Family Medicine

## 2019-08-28 ENCOUNTER — Encounter: Payer: Self-pay | Admitting: Family Medicine

## 2019-08-28 DIAGNOSIS — I10 Essential (primary) hypertension: Secondary | ICD-10-CM

## 2019-09-04 MED ORDER — AMLODIPINE BESYLATE 10 MG PO TABS: 10.0000 mg | ORAL_TABLET | Freq: Every day | ORAL | 1 refills | Status: DC

## 2019-09-08 DIAGNOSIS — M5136 Other intervertebral disc degeneration, lumbar region: Secondary | ICD-10-CM | POA: Diagnosis not present

## 2019-09-08 DIAGNOSIS — M5033 Other cervical disc degeneration, cervicothoracic region: Secondary | ICD-10-CM | POA: Diagnosis not present

## 2019-09-08 DIAGNOSIS — M9903 Segmental and somatic dysfunction of lumbar region: Secondary | ICD-10-CM | POA: Diagnosis not present

## 2019-09-08 DIAGNOSIS — M9901 Segmental and somatic dysfunction of cervical region: Secondary | ICD-10-CM | POA: Diagnosis not present

## 2019-09-28 ENCOUNTER — Encounter: Payer: Self-pay | Admitting: Family Medicine

## 2019-10-06 DIAGNOSIS — M9903 Segmental and somatic dysfunction of lumbar region: Secondary | ICD-10-CM | POA: Diagnosis not present

## 2019-10-06 DIAGNOSIS — M5136 Other intervertebral disc degeneration, lumbar region: Secondary | ICD-10-CM | POA: Diagnosis not present

## 2019-10-06 DIAGNOSIS — M5033 Other cervical disc degeneration, cervicothoracic region: Secondary | ICD-10-CM | POA: Diagnosis not present

## 2019-10-06 DIAGNOSIS — M9901 Segmental and somatic dysfunction of cervical region: Secondary | ICD-10-CM | POA: Diagnosis not present

## 2019-10-13 ENCOUNTER — Ambulatory Visit (INDEPENDENT_AMBULATORY_CARE_PROVIDER_SITE_OTHER): Payer: Medicare PPO

## 2019-10-13 VITALS — BP 139/65 | HR 64 | Ht 69.0 in | Wt 255.0 lb

## 2019-10-13 DIAGNOSIS — Z0000 Encounter for general adult medical examination without abnormal findings: Secondary | ICD-10-CM | POA: Diagnosis not present

## 2019-10-13 NOTE — Progress Notes (Signed)
Subjective:   [name] is a 79 y.o. male who presents for Medicare Annual/Subsequent preventive examination.  Review of Systems    No ROS.  Medicare Wellness Virtual Visit.   Cardiac Risk Factors include: advanced age (>54men, >30 women);hypertension;male gender     Objective:    Today's Vitals   10/13/19 1031  BP: 139/65  Pulse: 64  Weight: 255 lb (115.7 kg)  Height: 5\' 9"  (1.753 m)   Body mass index is 37.66 kg/m.  Advanced Directives 10/13/2019 11/13/2018 10/10/2018 10/09/2017 10/07/2017 10/04/2017 04/02/2017  Does Patient Have a Medical Advance Directive? No No No No No No No  Would patient like information on creating a medical advance directive? No - Patient declined - No - Patient declined No - Patient declined No - Patient declined No - Patient declined -    Current Medications (verified) Outpatient Encounter Medications as of 10/13/2019  Medication Sig  . amLODipine (NORVASC) 10 MG tablet Take 1 tablet (10 mg total) by mouth at bedtime.  Marland Kitchen aspirin 81 MG tablet Take 81 mg by mouth daily.  . metoprolol succinate (TOPROL-XL) 50 MG 24 hr tablet TAKE 1 TABLET BY MOUTH EVERY DAY  . Misc Natural Products (TART CHERRY ADVANCED PO) Take 425 mg by mouth daily.  . Multiple Vitamins-Minerals ([employer] SPECTRAVITE SENIOR) TABS Take 1 tablet by mouth daily.   . naproxen sodium (ALEVE) 220 MG tablet Take 220 mg by mouth daily as needed (pain).  . pantoprazole (PROTONIX) 40 MG tablet TAKE 1 TABLET BY MOUTH TWICE A DAY (Patient taking differently: Take 40 mg by mouth daily. )   Facility-Administered Encounter Medications as of 10/13/2019  Medication  . 0.9 %  sodium chloride infusion    Allergies (verified) Penicillins, Acetaminophen, and Aspirin   History: Past Medical History:  Diagnosis Date  . Allergy   . Anxiety   . Arthritis   . Cataract    beginning stage bilateral  . Elevated PSA   . GERD (gastroesophageal reflux disease)   . Gout   . History of kidney stones   .  Hyperlipidemia   . Hypertension   . Inguinal hernia    Past Surgical History:  Procedure Laterality Date  . BACK SURGERY  1980's   L4/5  . BALLOON DILATION N/A 11/17/2018   Procedure: URETHRAL BALLOON DILATION;  Surgeon: Hollice Espy, MD;  Location: [facility];  Service: Urology;  Laterality: N/A;  . CARPAL TUNNEL RELEASE Right 08/15/2016   Procedure: CARPAL TUNNEL RELEASE ENDOSCOPIC;  Surgeon: Corky Mull, MD;  Location: [place];  Service: Orthopedics;  Laterality: Right;  . COLONOSCOPY    . CYSTOSCOPY N/A 11/17/2018   Procedure: CYSTOSCOPY;  Surgeon: Hollice Espy, MD;  Location: [facility];  Service: Urology;  Laterality: N/A;  . CYSTOSCOPY WITH LITHOLAPAXY N/A 10/09/2017   Procedure: CYSTOSCOPY WITH LITHOLAPAXY;  Surgeon: Hollice Espy, MD;  Location: [facility];  Service: Urology;  Laterality: N/A;  . ESOPHAGOGASTRODUODENOSCOPY  11-21-06   Esophagitis, Stricture/dilated   . HOLEP-LASER ENUCLEATION OF THE PROSTATE WITH MORCELLATION N/A 10/09/2017   Procedure: HOLEP-LASER ENUCLEATION OF THE PROSTATE WITH MORCELLATION;  Surgeon: Hollice Espy, MD;  Location: [facility];  Service: Urology;  Laterality: N/A;  . LYMPH NODE BIOPSY  1957   benign  lymph node removed  . Right arm fracture    . TONSILLECTOMY    . TOTAL KNEE ARTHROPLASTY  09-02-02   Bilateral  . UPPER GASTROINTESTINAL ENDOSCOPY     Family History  Problem Relation Age  of Onset  . Ulcers Father   . Diverticulitis Father   . Arthritis Father   . Hypertension Brother   . Obesity Brother   . Cancer Brother        throat  . Arthritis Mother   . Depression Mother        Anxiety  . Colon cancer Neg Hx   . Colon polyps Neg Hx   . Esophageal cancer Neg Hx   . Rectal cancer Neg Hx   . Stomach cancer Neg Hx   . Kidney disease Neg Hx   . Kidney cancer Neg Hx   . Prostate cancer Neg Hx    Social History   Socioeconomic History  . Marital status: Married    Spouse name: Not on file  . Number of children: 1    . Years of education: Not on file  . Highest education level: Not on file  Occupational History  . Occupation: Retired Theatre stage manager: [employer]    Comment: Programme researcher, broadcasting/film/video   Tobacco Use  . Smoking status: Never Smoker  . Smokeless tobacco: Never Used  Vaping Use  . Vaping Use: Never used  Substance and Sexual Activity  . Alcohol use: No  . Drug use: No  . Sexual activity: Yes  Other Topics Concern  . Not on file  Social History Narrative   Lives in [place] with wife. From [place], [place]. 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 Daughter, [name], lives in [place]. No pets.      Exercise: regular   Diet: increased sweets   Social Determinants of Health   Financial Resource Strain: Low Risk   . Difficulty of Paying Living Expenses: Not hard at all  Food Insecurity: No Food Insecurity  . Worried About Charity fundraiser in the Last Year: Never true  . Ran Out of Food in the Last Year: Never true  Transportation Needs: No Transportation Needs  . Lack of Transportation (Medical): No  . Lack of Transportation (Non-Medical): No  Physical Activity:   . Days of Exercise per Week:   . Minutes of Exercise per Session:   Stress:   . Feeling of Stress :   Social Connections: Unknown  . Frequency of Communication with Friends and Family: Not on file  . Frequency of Social Gatherings with Friends and Family: Not on file  . Attends Religious Services: Not on file  . Active Member of Clubs or Organizations: Not on file  . Attends Archivist Meetings: Not on file  . Marital Status: Married    Tobacco Counseling Counseling given: Not Answered   Clinical Intake:  Pre-visit preparation completed: Yes        Diabetes: No  How often do you need to have someone help you when you read instructions, pamphlets, or other written materials from your doctor or pharmacy?: 1 - Never  Interpreter Needed?: No      Activities of Daily Living In your  present state of health, do you have any difficulty performing the following activities: 10/13/2019 11/13/2018  Hearing? N N  Vision? N N  Difficulty concentrating or making decisions? N N  Walking or climbing stairs? N N  Dressing or bathing? N N  Doing errands, shopping? N N  Preparing Food and eating ? N -  Using the Toilet? N -  In the past six months, have you accidently leaked urine? N -  Do you have problems with loss of bowel  control? N -  Managing your Medications? N -  Managing your Finances? N -  Housekeeping or managing your Housekeeping? N -  Some recent data might be hidden    Patient Care Team: Leone Haven, MD as PCP - General (Family Medicine) Dasher, Rayvon Char, MD (Dermatology) Birder Robson, MD as Referring Physician (Ophthalmology) Gaynelle Arabian, MD (Orthopedic Surgery)  Indicate any recent Medical Services you may have received from other than [facility] providers in the past year (date may be approximate).     Assessment:   This is a routine wellness examination for [facility].  I connected with Rafeal today by telephone and verified that I am speaking with the correct person using two identifiers. Location patient: home Location provider: work Persons participating in the virtual visit: patient, Marine scientist.    I discussed the limitations, risks, security and privacy concerns of performing an evaluation and management service by telephone and the availability of in person appointments. The patient expressed understanding and verbally consented to this telephonic visit.    Interactive audio and video telecommunications were attempted between this provider and patient, however failed, due to patient having technical difficulties OR patient did not have access to video capability.  We continued and completed visit with audio only.  Some vital signs may be absent or patient reported.   Hearing/Vision screen  Hearing Screening   125Hz  250Hz  500Hz  1000Hz  2000Hz  3000Hz   4000Hz  6000Hz  8000Hz   Right ear:           Left ear:           Comments: Patient is able to hear conversational tones without difficulty.  No issues reported.  Vision Screening Comments: Followed [facility], Dr. George Ina Wears corrective lenses Visual acuity not assessed, virtual visit.  They have seen their ophthalmologist in the last 12 months.    Dietary issues and exercise activities discussed: Current Exercise Habits: Home exercise routine, Type of exercise: walking;calisthenics (waist line exercises), Intensity: Mild  Healthy diet Fair water intake Good overall fluid intake  Goals    . Follow up with Provider as scheduled      Depression Screen PHQ 2/9 Scores 10/13/2019 08/14/2019 02/11/2019 10/10/2018 10/07/2017 10/15/2016 10/12/2015  PHQ - 2 Score 0 0 0 0 0 0 0  PHQ- 9 Score - - - - - 0 -    Fall Risk Fall Risk  10/13/2019 08/14/2019 02/11/2019 10/10/2018 10/07/2017  Falls in the past year? 0 0 0 0 No  Number falls in past yr: 0 0 0 - -  Follow up Falls evaluation completed Falls evaluation completed Falls evaluation completed - -   Handrails in use when climbing stairs? Yes  Home free of loose throw rugs in walkways, pet beds, electrical cords, etc? Yes  Adequate lighting in your home to reduce risk of falls? Yes   ASSISTIVE DEVICES UTILIZED TO PREVENT FALLS:  Life alert? No  Use of a cane, [place] or w/c? No  Grab bars in the bathroom? Yes  Shower chair or bench in shower? No  Elevated toilet seat or a handicapped toilet? No   TIMED UP AND GO:  Was the test performed? No . Virtual visit.   Cognitive Function: Patient is alert and oriented x3. 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  MMSE - Mini Mental State Exam 10/13/2019 10/07/2017 10/15/2016 10/12/2015  Not completed: Unable to complete - - -  Orientation to time - 5 5 5   Orientation to Place -  5 5 5   Registration - 3 3 3   Attention/ Calculation - 5  5 5   Recall - 3 3 3   Language- name 2 objects - 2 2 2   Language- repeat - 1 1 1   Language- follow 3 step command - 3 3 3   Language- read & follow direction - 1 1 1   Write a sentence - 1 1 1   Copy design - 1 1 1   Total score - 30 30 30      6CIT Screen 10/10/2018  What Year? 0 points  What month? 0 points  What time? 0 points  Count back from 20 0 points  Months in reverse 0 points  Repeat phrase 0 points  Total Score 0    Immunizations Immunization History  Administered Date(s) Administered  . Fluad Quad(high Dose 65+) 11/26/2018, 01/09/2019  . Influenza Whole 01/03/2006, 12/24/2011  . Influenza, High Dose Seasonal PF 10/20/2016, 11/03/2017, 12/03/2017  . Influenza,inj,Quad PF,6+ Mos 12/15/2012, 10/28/2014  . Influenza-Unspecified 12/25/2013, 11/08/2015, 10/20/2016  . PFIZER SARS-COV-2 Vaccination 03/26/2019, 04/16/2019  . Pneumococcal Conjugate-13 06/30/2013  . Pneumococcal Polysaccharide-23 06/21/2006  . Td 08/19/2002  . Tdap 08/13/2014  . Zoster 12/28/2009  . Zoster Recombinat (Shingrix) 10/20/2018, 01/09/2019    Health Maintenance Health Maintenance  Topic Date Due  . INFLUENZA VACCINE  10/04/2019  . TETANUS/TDAP  08/12/2024  . COVID-19 Vaccine  Completed  . PNA vac Low Risk Adult  Completed   Dental Screening: Recommended annual dental exams for proper oral hygiene. Visits every 6 months.  Community Resource Referral / Chronic Care Management: CRR required this visit?  No   CCM required this visit?  No      Plan:   Keep all routine maintenance appointments.   Follow up 02/15/20 @ 9:30  I have personally reviewed and noted the following in the patient's chart:   . Medical and social history . Use of alcohol, tobacco or illicit drugs  . Current medications and supplements . Functional ability and status . Nutritional status . Physical activity . Advanced directives . List of other physicians . Hospitalizations, surgeries, and ER visits in previous  12 months . Vitals . Screenings to include cognitive, depression, and falls . Referrals and appointments  In addition, I have reviewed and discussed with patient certain preventive protocols, quality metrics, and best practice recommendations. A written personalized care plan for preventive services as well as general preventive health recommendations were provided to patient via mychart.     Varney Biles, LPN   9/31/1216

## 2019-10-13 NOTE — Patient Instructions (Addendum)
[name] , Thank you for taking time to come for your Medicare Wellness Visit. I appreciate your ongoing commitment to your health goals. Please review the following plan we discussed and let me know if I can assist you in the future.   These are the goals we discussed: Goals    . Follow up with Provider as scheduled       This is a list of the screening recommended for you and due dates:  Health Maintenance  Topic Date Due  . Flu Shot  10/04/2019  . Tetanus Vaccine  08/12/2024  . COVID-19 Vaccine  Completed  . Pneumonia vaccines  Completed    Immunizations Immunization History  Administered Date(s) Administered  . Fluad Quad(high Dose 65+) 11/26/2018, 01/09/2019  . Influenza Whole 01/03/2006, 12/24/2011  . Influenza, High Dose Seasonal PF 10/20/2016, 11/03/2017, 12/03/2017  . Influenza,inj,Quad PF,6+ Mos 12/15/2012, 10/28/2014  . Influenza-Unspecified 12/25/2013, 11/08/2015, 10/20/2016  . PFIZER SARS-COV-2 Vaccination 03/26/2019, 04/16/2019  . Pneumococcal Conjugate-13 06/30/2013  . Pneumococcal Polysaccharide-23 06/21/2006  . Td 08/19/2002  . Tdap 08/13/2014  . Zoster 12/28/2009  . Zoster Recombinat (Shingrix) 10/20/2018, 01/09/2019   Keep all routine maintenance appointments.   Follow up 02/15/20 @ 9:30  Advanced directives: declined  Conditions/risks identified: none new  Follow up in one year for your annual wellness visit.   Preventive Care 25 Years and Older, Male Preventive care refers to lifestyle choices and visits with your health care provider that can promote health and wellness. What does preventive care include?  A yearly physical exam. This is also called an annual well check.  Dental exams once or twice a year.  Routine eye exams. Ask your health care provider how often you should have your eyes checked.  Personal lifestyle choices, including:  Daily care of your teeth and gums.  Regular physical activity.  Eating a healthy  diet.  Avoiding tobacco and drug use.  Limiting alcohol use.  Practicing safe sex.  Taking low doses of aspirin every day.  Taking vitamin and mineral supplements as recommended by your health care provider. What happens during an annual well check? The services and screenings done by your health care provider during your annual well check will depend on your age, overall health, lifestyle risk factors, and family history of disease. Counseling  Your health care provider may ask you questions about your:  Alcohol use.  Tobacco use.  Drug use.  Emotional well-being.  Home and relationship well-being.  Sexual activity.  Eating habits.  History of falls.  Memory and ability to understand (cognition).  Work and work Statistician. Screening  You may have the following tests or measurements:  Height, weight, and BMI.  Blood pressure.  Lipid and cholesterol levels. These may be checked every 5 years, or more frequently if you are over 54 years old.  Skin check.  Lung cancer screening. You may have this screening every year starting at age 20 if you have a 30-pack-year history of smoking and currently smoke or have quit within the past 15 years.  Fecal occult blood test (FOBT) of the stool. You may have this test every year starting at age 22.  Flexible sigmoidoscopy or colonoscopy. You may have a sigmoidoscopy every 5 years or a colonoscopy every 10 years starting at age 92.  Prostate cancer screening. Recommendations will vary depending on your family history and other risks.  Hepatitis C blood test.  Hepatitis B blood test.  Sexually transmitted disease (STD) testing.  Diabetes screening.  This is done by checking your blood sugar (glucose) after you have not eaten for a while (fasting). You may have this done every 1-3 years.  Abdominal aortic aneurysm (AAA) screening. You may need this if you are a current or former smoker.  Osteoporosis. You may be screened  starting at age 33 if you are at high risk. Talk with your health care provider about your test results, treatment options, and if necessary, the need for more tests. Vaccines  Your health care provider may recommend certain vaccines, such as:  Influenza vaccine. This is recommended every year.  Tetanus, diphtheria, and acellular pertussis (Tdap, Td) vaccine. You may need a Td booster every 10 years.  Zoster vaccine. You may need this after age 32.  Pneumococcal 13-valent conjugate (PCV13) vaccine. One dose is recommended after age 80.  Pneumococcal polysaccharide (PPSV23) vaccine. One dose is recommended after age 91. Talk to your health care provider about which screenings and vaccines you need and how often you need them. This information is not intended to replace advice given to you by your health care provider. Make sure you discuss any questions you have with your health care provider. Document Released: 03/18/2015 Document Revised: 11/09/2015 Document Reviewed: 12/21/2014 Elsevier Interactive Patient Education  2017 [place] Prevention in the Home Falls can cause injuries. They can happen to people of all ages. There are many things you can do to make your home safe and to help prevent falls. What can I do on the outside of my home?  Regularly fix the edges of walkways and driveways and fix any cracks.  Remove anything that might make you trip as you walk through a door, such as a raised step or threshold.  Trim any bushes or trees on the path to your home.  Use bright outdoor lighting.  Clear any walking paths of anything that might make someone trip, such as rocks or tools.  Regularly check to see if handrails are loose or broken. Make sure that both sides of any steps have handrails.  Any raised decks and porches should have guardrails on the edges.  Have any leaves, snow, or ice cleared regularly.  Use sand or salt on walking paths during  winter.  Clean up any spills in your garage right away. This includes oil or grease spills. What can I do in the bathroom?  Use night lights.  Install grab bars by the toilet and in the tub and shower. Do not use towel bars as grab bars.  Use non-skid mats or decals in the tub or shower.  If you need to sit down in the shower, use a plastic, non-slip stool.  Keep the floor dry. Clean up any water that spills on the floor as soon as it happens.  Remove soap buildup in the tub or shower regularly.  Attach bath mats securely with double-sided non-slip rug tape.  Do not have throw rugs and other things on the floor that can make you trip. What can I do in the bedroom?  Use night lights.  Make sure that you have a light by your bed that is easy to reach.  Do not use any sheets or blankets that are too big for your bed. They should not hang down onto the floor.  Have a firm chair that has side arms. You can use this for support while you get dressed.  Do not have throw rugs and other things on the floor that can make you  trip. What can I do in the kitchen?  Clean up any spills right away.  Avoid walking on wet floors.  Keep items that you use a lot in easy-to-reach places.  If you need to reach something above you, use a strong step stool that has a grab bar.  Keep electrical cords out of the way.  Do not use floor polish or wax that makes floors slippery. If you must use wax, use non-skid floor wax.  Do not have throw rugs and other things on the floor that can make you trip. What can I do with my stairs?  Do not leave any items on the stairs.  Make sure that there are handrails on both sides of the stairs and use them. Fix handrails that are broken or loose. Make sure that handrails are as long as the stairways.  Check any carpeting to make sure that it is firmly attached to the stairs. Fix any carpet that is loose or worn.  Avoid having throw rugs at the top or  bottom of the stairs. If you do have throw rugs, attach them to the floor with carpet tape.  Make sure that you have a light switch at the top of the stairs and the bottom of the stairs. If you do not have them, ask someone to add them for you. What else can I do to help prevent falls?  Wear shoes that:  Do not have high heels.  Have rubber bottoms.  Are comfortable and fit you well.  Are closed at the toe. Do not wear sandals.  If you use a stepladder:  Make sure that it is fully opened. Do not climb a closed stepladder.  Make sure that both sides of the stepladder are locked into place.  Ask someone to hold it for you, if possible.  Clearly mark and make sure that you can see:  Any grab bars or handrails.  First and last steps.  Where the edge of each step is.  Use tools that help you move around (mobility aids) if they are needed. These include:  Canes.  Walkers.  Scooters.  Crutches.  Turn on the lights when you go into a dark area. Replace any light bulbs as soon as they burn out.  Set up your furniture so you have a clear path. Avoid moving your furniture around.  If any of your floors are uneven, fix them.  If there are any pets around you, be aware of where they are.  Review your medicines with your doctor. Some medicines can make you feel dizzy. This can increase your chance of falling. Ask your doctor what other things that you can do to help prevent falls. This information is not intended to replace advice given to you by your health care provider. Make sure you discuss any questions you have with your health care provider. Document Released: 12/16/2008 Document Revised: 07/28/2015 Document Reviewed: 03/26/2014 Elsevier Interactive Patient Education  2017 [employer].

## 2019-10-19 ENCOUNTER — Encounter: Payer: Self-pay | Admitting: Family Medicine

## 2019-10-19 DIAGNOSIS — I10 Essential (primary) hypertension: Secondary | ICD-10-CM

## 2019-10-22 ENCOUNTER — Other Ambulatory Visit: Payer: Self-pay

## 2019-10-22 ENCOUNTER — Telehealth: Payer: Self-pay

## 2019-10-23 ENCOUNTER — Other Ambulatory Visit: Payer: Self-pay

## 2019-10-23 ENCOUNTER — Other Ambulatory Visit (INDEPENDENT_AMBULATORY_CARE_PROVIDER_SITE_OTHER): Payer: Medicare PPO

## 2019-10-23 DIAGNOSIS — I10 Essential (primary) hypertension: Secondary | ICD-10-CM

## 2019-10-23 LAB — BASIC METABOLIC PANEL
BUN: 18 mg/dL (ref 6–23)
CO2: 25 mEq/L (ref 19–32)
Calcium: 9.1 mg/dL (ref 8.4–10.5)
Chloride: 104 mEq/L (ref 96–112)
Creatinine, Ser: 1.38 mg/dL (ref 0.40–1.50)
GFR: 49.57 mL/min — ABNORMAL LOW (ref >60.00)
Glucose, Bld: 143 mg/dL — ABNORMAL HIGH (ref 70–99)
Potassium: 4.2 mEq/L (ref 3.5–5.1)
Sodium: 139 mEq/L (ref 135–145)

## 2019-10-31 ENCOUNTER — Other Ambulatory Visit: Payer: Self-pay | Admitting: Family Medicine

## 2019-11-03 DIAGNOSIS — M5136 Other intervertebral disc degeneration, lumbar region: Secondary | ICD-10-CM | POA: Diagnosis not present

## 2019-11-03 DIAGNOSIS — M5033 Other cervical disc degeneration, cervicothoracic region: Secondary | ICD-10-CM | POA: Diagnosis not present

## 2019-11-03 DIAGNOSIS — M9901 Segmental and somatic dysfunction of cervical region: Secondary | ICD-10-CM | POA: Diagnosis not present

## 2019-11-03 DIAGNOSIS — M9903 Segmental and somatic dysfunction of lumbar region: Secondary | ICD-10-CM | POA: Diagnosis not present

## 2019-11-05 ENCOUNTER — Other Ambulatory Visit: Payer: Self-pay | Admitting: Family Medicine

## 2019-11-05 DIAGNOSIS — N1831 Chronic kidney disease, stage 3a: Secondary | ICD-10-CM

## 2019-11-05 DIAGNOSIS — R809 Proteinuria, unspecified: Secondary | ICD-10-CM

## 2019-11-10 ENCOUNTER — Telehealth: Payer: Self-pay

## 2019-11-10 ENCOUNTER — Encounter: Payer: Self-pay | Admitting: Family Medicine

## 2019-11-10 NOTE — Telephone Encounter (Signed)
Close  

## 2019-11-10 NOTE — Telephone Encounter (Signed)
I called patient to let him know that non-urgent referrals take 7-10 business days. I also explained to him that it appeared that he had some decreased kidney function & was spilling protein, which indicates some damage done to his kidneys. I told him that he needed to see a nephrologist for surveillance & further work up if needed.

## 2019-12-01 DIAGNOSIS — M9903 Segmental and somatic dysfunction of lumbar region: Secondary | ICD-10-CM | POA: Diagnosis not present

## 2019-12-01 DIAGNOSIS — M5033 Other cervical disc degeneration, cervicothoracic region: Secondary | ICD-10-CM | POA: Diagnosis not present

## 2019-12-01 DIAGNOSIS — M5136 Other intervertebral disc degeneration, lumbar region: Secondary | ICD-10-CM | POA: Diagnosis not present

## 2019-12-01 DIAGNOSIS — M9901 Segmental and somatic dysfunction of cervical region: Secondary | ICD-10-CM | POA: Diagnosis not present

## 2019-12-04 NOTE — Telephone Encounter (Signed)
Patient had labs done.  Worth Kober,cma  

## 2019-12-14 ENCOUNTER — Telehealth: Payer: Self-pay | Admitting: Family Medicine

## 2019-12-14 NOTE — Telephone Encounter (Signed)
Please call the patient and let him know that the nephrologist attempted to call to schedule an appointment. If he is willing to go see them then he needs to call them to schedule an appointment.

## 2019-12-14 NOTE — Telephone Encounter (Signed)
Rejection Reason - Patient did not respond - MB full no call back " [place] said on Dec 14, 2019 3:23 PM

## 2019-12-15 NOTE — Telephone Encounter (Signed)
I called and spoke with the patient and informed him that the nephrologist was trying to reach him to schedule, I gave him the phone number and address and he stated he would call and schedule.  Shevelle Smither,cma

## 2019-12-27 ENCOUNTER — Encounter: Payer: Self-pay | Admitting: Family Medicine

## 2019-12-28 MED ORDER — PANTOPRAZOLE SODIUM 40 MG PO TBEC
40.0000 mg | DELAYED_RELEASE_TABLET | Freq: Two times a day (BID) | ORAL | 3 refills | Status: DC
Start: 2019-12-28 — End: 2020-08-02

## 2019-12-30 DIAGNOSIS — D2261 Melanocytic nevi of right upper limb, including shoulder: Secondary | ICD-10-CM | POA: Diagnosis not present

## 2019-12-30 DIAGNOSIS — L570 Actinic keratosis: Secondary | ICD-10-CM | POA: Diagnosis not present

## 2019-12-30 DIAGNOSIS — L821 Other seborrheic keratosis: Secondary | ICD-10-CM | POA: Diagnosis not present

## 2019-12-30 DIAGNOSIS — D225 Melanocytic nevi of trunk: Secondary | ICD-10-CM | POA: Diagnosis not present

## 2019-12-30 DIAGNOSIS — D2271 Melanocytic nevi of right lower limb, including hip: Secondary | ICD-10-CM | POA: Diagnosis not present

## 2019-12-30 DIAGNOSIS — Z85828 Personal history of other malignant neoplasm of skin: Secondary | ICD-10-CM | POA: Diagnosis not present

## 2019-12-30 DIAGNOSIS — X32XXXA Exposure to sunlight, initial encounter: Secondary | ICD-10-CM | POA: Diagnosis not present

## 2019-12-30 DIAGNOSIS — D2262 Melanocytic nevi of left upper limb, including shoulder: Secondary | ICD-10-CM | POA: Diagnosis not present

## 2020-01-05 DIAGNOSIS — M9901 Segmental and somatic dysfunction of cervical region: Secondary | ICD-10-CM | POA: Diagnosis not present

## 2020-01-05 DIAGNOSIS — M5136 Other intervertebral disc degeneration, lumbar region: Secondary | ICD-10-CM | POA: Diagnosis not present

## 2020-01-05 DIAGNOSIS — M5033 Other cervical disc degeneration, cervicothoracic region: Secondary | ICD-10-CM | POA: Diagnosis not present

## 2020-01-05 DIAGNOSIS — M9903 Segmental and somatic dysfunction of lumbar region: Secondary | ICD-10-CM | POA: Diagnosis not present

## 2020-01-14 DIAGNOSIS — I10 Essential (primary) hypertension: Secondary | ICD-10-CM | POA: Diagnosis not present

## 2020-01-14 DIAGNOSIS — N1831 Chronic kidney disease, stage 3a: Secondary | ICD-10-CM | POA: Diagnosis not present

## 2020-01-14 DIAGNOSIS — R809 Proteinuria, unspecified: Secondary | ICD-10-CM | POA: Diagnosis not present

## 2020-01-14 DIAGNOSIS — R310 Gross hematuria: Secondary | ICD-10-CM | POA: Diagnosis not present

## 2020-01-14 DIAGNOSIS — R8290 Unspecified abnormal findings in urine: Secondary | ICD-10-CM | POA: Diagnosis not present

## 2020-01-15 ENCOUNTER — Other Ambulatory Visit: Payer: Self-pay | Admitting: Nephrology

## 2020-01-15 DIAGNOSIS — I10 Essential (primary) hypertension: Secondary | ICD-10-CM

## 2020-01-15 DIAGNOSIS — R310 Gross hematuria: Secondary | ICD-10-CM

## 2020-01-15 DIAGNOSIS — R809 Proteinuria, unspecified: Secondary | ICD-10-CM

## 2020-01-15 DIAGNOSIS — R82998 Other abnormal findings in urine: Secondary | ICD-10-CM

## 2020-01-15 DIAGNOSIS — N1831 Chronic kidney disease, stage 3a: Secondary | ICD-10-CM

## 2020-01-21 ENCOUNTER — Ambulatory Visit
Admission: RE | Admit: 2020-01-21 | Discharge: 2020-01-21 | Disposition: A | Discharge status: Home / Self Care | Payer: Medicare PPO | Source: Ambulatory Visit | Attending: Nephrology | Admitting: Nephrology

## 2020-01-21 ENCOUNTER — Other Ambulatory Visit: Payer: Self-pay

## 2020-01-21 DIAGNOSIS — N183 Chronic kidney disease, stage 3 unspecified: Secondary | ICD-10-CM | POA: Diagnosis not present

## 2020-01-21 DIAGNOSIS — N1831 Chronic kidney disease, stage 3a: Secondary | ICD-10-CM | POA: Insufficient documentation

## 2020-01-21 DIAGNOSIS — R310 Gross hematuria: Secondary | ICD-10-CM

## 2020-01-21 DIAGNOSIS — I10 Essential (primary) hypertension: Secondary | ICD-10-CM

## 2020-01-21 DIAGNOSIS — R809 Proteinuria, unspecified: Secondary | ICD-10-CM

## 2020-01-21 DIAGNOSIS — R82998 Other abnormal findings in urine: Secondary | ICD-10-CM | POA: Diagnosis not present

## 2020-01-22 DIAGNOSIS — H2513 Age-related nuclear cataract, bilateral: Secondary | ICD-10-CM | POA: Diagnosis not present

## 2020-01-29 ENCOUNTER — Encounter: Payer: Self-pay | Admitting: Family Medicine

## 2020-01-30 ENCOUNTER — Encounter: Payer: Self-pay | Admitting: Family Medicine

## 2020-02-01 NOTE — Telephone Encounter (Signed)
Called and spoke to [place]. Joyce verbalized understanding and agreed to See Mable Paris on 02/03/20 at 3:30pm.

## 2020-02-02 NOTE — Telephone Encounter (Signed)
Margaret can't see patient on 02/03/20 due to her being out of office. Will you ask Dr. Caryl Bis if it would be ok if he saw patient on 02/04/20 @ 12pm? I went ahead and cancelled appointment with Joycelyn Schmid and no other provider in the office has availability. I let pt know that we will call him back.

## 2020-02-03 ENCOUNTER — Ambulatory Visit: Payer: Medicare PPO | Admitting: Family

## 2020-02-04 ENCOUNTER — Encounter: Payer: Self-pay | Admitting: Family Medicine

## 2020-02-04 ENCOUNTER — Other Ambulatory Visit: Payer: Self-pay

## 2020-02-04 ENCOUNTER — Ambulatory Visit: Payer: Medicare PPO | Admitting: Family Medicine

## 2020-02-04 DIAGNOSIS — C61 Malignant neoplasm of prostate: Secondary | ICD-10-CM | POA: Diagnosis not present

## 2020-02-04 DIAGNOSIS — K625 Hemorrhage of anus and rectum: Secondary | ICD-10-CM | POA: Insufficient documentation

## 2020-02-04 LAB — CBC
HCT: 42.4 % (ref 39.0–52.0)
Hemoglobin: 14.2 g/dL (ref 13.0–17.0)
MCHC: 33.5 g/dL (ref 30.0–36.0)
MCV: 90.3 fl (ref 78.0–100.0)
Platelets: 205 10*3/uL (ref 150.0–400.0)
RBC: 4.7 Mil/uL (ref 4.22–5.81)
RDW: 13.7 % (ref 11.5–15.5)
WBC: 7.6 10*3/uL (ref 4.0–10.5)

## 2020-02-04 LAB — BASIC METABOLIC PANEL
BUN: 20 mg/dL (ref 6–23)
CO2: 30 mEq/L (ref 19–32)
Calcium: 9.2 mg/dL (ref 8.4–10.5)
Chloride: 102 mEq/L (ref 96–112)
Creatinine, Ser: 1.42 mg/dL (ref 0.40–1.50)
GFR: 46.72 mL/min — ABNORMAL LOW (ref >60.00)
Glucose, Bld: 92 mg/dL (ref 70–99)
Potassium: 4.5 mEq/L (ref 3.5–5.1)
Sodium: 138 mEq/L (ref 135–145)

## 2020-02-04 LAB — PSA: PSA: 6.41 ng/mL — ABNORMAL HIGH (ref 0.10–4.00)

## 2020-02-04 NOTE — Assessment & Plan Note (Signed)
Discussed that I felt the nodule on his exam.  We will get a PSA.  He has an upcoming appointment with urology.  Note forwarded to his urologist.

## 2020-02-04 NOTE — Progress Notes (Signed)
Tommi Rumps, MD Phone: [phone]  [name] is a 79 y.o. male who presents today for same-day visit.  Blood in stool: Patient notes 2 episodes of bright red blood with clots in his stool about a week ago.  He has been quite constipated since retiring and notes he was straining significantly those 2 days.  He wiped several times both of those days and had just blood and small blood clots on the toilet paper.  He has increased his fruits and desserts in his stool has softened up.  He has not had any bleeding in the last 3 days.  He notes no history of hemorrhoids.  No lightheadedness.  No abdominal pain.  No chest pain.  Some mild fatigue has been going on since he retired.  Some minimal dyspnea at times depending on what he is doing.  Prostate cancer: Patient with a history of this.  Follows with urology.  Right-sided prostate nodule noted on exam today.  The patient sees urology on 02/16/2020 for follow-up.  Social History   Tobacco Use  Smoking Status Never Smoker  Smokeless Tobacco Never Used     ROS see history of present illness  Objective  Physical Exam Vitals:   02/04/20 1209  BP: 121/80  Pulse: 68  Temp: 98.7 F (37.1 C)  SpO2: 98%    BP Readings from Last 3 Encounters:  02/04/20 121/80  10/13/19 139/65  08/18/19 (!) 186/74   Wt Readings from Last 3 Encounters:  02/04/20 256 lb 6.4 oz (116.3 kg)  10/13/19 255 lb (115.7 kg)  08/14/19 255 lb 3.2 oz (115.8 kg)    Physical Exam Constitutional:      General: He is not in acute distress.    Appearance: He is not diaphoretic.  Cardiovascular:     Rate and Rhythm: Normal rate and regular rhythm.     Heart sounds: Normal heart sounds.  Pulmonary:     Effort: Pulmonary effort is normal.     Breath sounds: Normal breath sounds.  Abdominal:     General: Bowel sounds are normal. There is no distension.     Palpations: Abdomen is soft.     Tenderness: There is no abdominal tenderness. There is no guarding  or rebound.  Genitourinary:    Comments: Normal external rectum, right-sided prostate nodule noted, negative FOBT Skin:    General: Skin is warm and dry.  Neurological:     Mental Status: He is alert.      Assessment/Plan: Please see individual problem list.  Problem List Items Addressed This Visit    Prostate cancer [facility])    Discussed that I felt the nodule on his exam.  We will get a PSA.  He has an upcoming appointment with urology.  Note forwarded to his urologist.      Relevant Orders   PSA   Rectal bleeding    Bleeding has apparently stopped at this time.  We will check a CBC given his fatigue and minimal dyspnea on occasion.  We will also check a BMP.  Will refer to GI.  Advised to seek medical attention if he has recurrent bleeding or if he develops worsening breathing issues or chest pain.      Relevant Orders   Ambulatory referral to Gastroenterology   CBC   Basic Metabolic Panel (BMET)       This visit occurred during the SARS-CoV-2 public health emergency.  Safety protocols were in place, including screening questions prior to the visit, additional  usage of staff PPE, and extensive cleaning of exam room while observing appropriate contact time as indicated for disinfecting solutions.    Tommi Rumps, MD [place]

## 2020-02-04 NOTE — Patient Instructions (Signed)
Nice to see you. We will get labs today and contact you with the results. Placed a referral to GI. If you have recurrent bleeding please seek medical attention.  If you develop worsening breathing issues or chest pain please seek medical attention as well.

## 2020-02-04 NOTE — Assessment & Plan Note (Signed)
Bleeding has apparently stopped at this time.  We will check a CBC given his fatigue and minimal dyspnea on occasion.  We will also check a BMP.  Will refer to GI.  Advised to seek medical attention if he has recurrent bleeding or if he develops worsening breathing issues or chest pain.

## 2020-02-09 DIAGNOSIS — M9903 Segmental and somatic dysfunction of lumbar region: Secondary | ICD-10-CM | POA: Diagnosis not present

## 2020-02-09 DIAGNOSIS — M9901 Segmental and somatic dysfunction of cervical region: Secondary | ICD-10-CM | POA: Diagnosis not present

## 2020-02-09 DIAGNOSIS — M5033 Other cervical disc degeneration, cervicothoracic region: Secondary | ICD-10-CM | POA: Diagnosis not present

## 2020-02-09 DIAGNOSIS — M5136 Other intervertebral disc degeneration, lumbar region: Secondary | ICD-10-CM | POA: Diagnosis not present

## 2020-02-11 DIAGNOSIS — N1832 Chronic kidney disease, stage 3b: Secondary | ICD-10-CM | POA: Diagnosis not present

## 2020-02-11 DIAGNOSIS — R800 Isolated proteinuria: Secondary | ICD-10-CM | POA: Diagnosis not present

## 2020-02-11 DIAGNOSIS — I10 Essential (primary) hypertension: Secondary | ICD-10-CM | POA: Diagnosis not present

## 2020-02-15 ENCOUNTER — Other Ambulatory Visit: Payer: Medicare PPO

## 2020-02-15 ENCOUNTER — Encounter: Payer: Self-pay | Admitting: Family Medicine

## 2020-02-15 ENCOUNTER — Other Ambulatory Visit: Payer: Self-pay

## 2020-02-15 ENCOUNTER — Ambulatory Visit: Payer: Medicare PPO | Admitting: Family Medicine

## 2020-02-15 DIAGNOSIS — I10 Essential (primary) hypertension: Secondary | ICD-10-CM | POA: Diagnosis not present

## 2020-02-15 DIAGNOSIS — Z6838 Body mass index (BMI) 38.0-38.9, adult: Secondary | ICD-10-CM | POA: Diagnosis not present

## 2020-02-15 DIAGNOSIS — E6609 Other obesity due to excess calories: Secondary | ICD-10-CM

## 2020-02-15 DIAGNOSIS — K219 Gastro-esophageal reflux disease without esophagitis: Secondary | ICD-10-CM | POA: Diagnosis not present

## 2020-02-15 DIAGNOSIS — K625 Hemorrhage of anus and rectum: Secondary | ICD-10-CM

## 2020-02-15 NOTE — Assessment & Plan Note (Signed)
Well-controlled.  Continue Protonix 40 mg twice daily.

## 2020-02-15 NOTE — Assessment & Plan Note (Signed)
Dr Vena Rua name was added to the referral.  They will contact him to schedule.

## 2020-02-15 NOTE — Assessment & Plan Note (Signed)
Encouraged continued dietary changes and exercise.

## 2020-02-15 NOTE — Assessment & Plan Note (Signed)
Adequate control.  Continue metoprolol 50 mg once daily and amlodipine 10 mg once daily.

## 2020-02-15 NOTE — Progress Notes (Signed)
  Duane Rumps, MD Phone: [phone]  [name] is a 79 y.o. male who presents today for f/u.  HYPERTENSION  Disease Monitoring  Home BP Monitoring not checking consistently Chest pain- no    Dyspnea- no Medications  Compliance-  Taking metoprolol, amlodipine.  Edema- no  GERD:   Reflux symptoms: no   Abd pain: no   Blood in stool: see below  Dysphagia: no   EGD: 2008 with benign stenosis and esohpagitis Medication: protonix BID, has had recurrent symptoms with decreasing the dose  Rectal bleeding: Patient notes no more episodes.  He is eating a lot more fruit which has helped soften his stools.  He requests to see Dr. Hilarie Sanchez.  Obesity: Patient notes he is cut out sodas.  He is drinking 2-4 sodas per day previously.  He is also started walking about half a mile a day.  He is eating more fruit.  He notes his nephrologist encouraged him to start exercising.     Social History   Tobacco Use  Smoking Status Never Smoker  Smokeless Tobacco Never Used     ROS see history of present illness  Objective  Physical Exam Vitals:   02/15/20 0921  BP: 123/79  Pulse: 67  Temp: 98.2 F (36.8 C)  SpO2: 99%    BP Readings from Last 3 Encounters:  02/15/20 123/79  02/04/20 121/80  10/13/19 139/65   Wt Readings from Last 3 Encounters:  02/15/20 257 lb 12.8 oz (116.9 kg)  02/04/20 256 lb 6.4 oz (116.3 kg)  10/13/19 255 lb (115.7 kg)    Physical Exam Constitutional:      General: He is not in acute distress.    Appearance: He is not diaphoretic.  Cardiovascular:     Rate and Rhythm: Normal rate and regular rhythm.     Heart sounds: Normal heart sounds.  Pulmonary:     Effort: Pulmonary effort is normal.     Breath sounds: Normal breath sounds.  Musculoskeletal:        General: No edema.     Right lower leg: No edema.     Left lower leg: No edema.  Skin:    General: Skin is warm and dry.  Neurological:     Mental Status: He is alert.       Assessment/Plan: Please see individual problem list.  Problem List Items Addressed This Visit    Essential hypertension    Adequate control.  Continue metoprolol 50 mg once daily and amlodipine 10 mg once daily.      GERD (gastroesophageal reflux disease)    Well-controlled.  Continue Protonix 40 mg twice daily.      Obesity    Encouraged continued dietary changes and exercise.      Rectal bleeding    Dr [name] name was added to the referral.  They will contact him to schedule.          This visit occurred during the SARS-CoV-2 public health emergency.  Safety protocols were in place, including screening questions prior to the visit, additional usage of staff PPE, and extensive cleaning of exam room while observing appropriate contact time as indicated for disinfecting solutions.    Duane Rumps, MD [place]

## 2020-02-15 NOTE — Patient Instructions (Signed)
Nice to see you. Please continue with diet and exercise. We will have you see GI.

## 2020-02-16 ENCOUNTER — Encounter: Payer: Self-pay | Admitting: Urology

## 2020-02-16 ENCOUNTER — Ambulatory Visit: Payer: Medicare PPO | Admitting: Urology

## 2020-02-16 VITALS — BP 195/83 | HR 83 | Wt 257.0 lb

## 2020-02-16 DIAGNOSIS — N183 Chronic kidney disease, stage 3 unspecified: Secondary | ICD-10-CM | POA: Diagnosis not present

## 2020-02-16 DIAGNOSIS — N401 Enlarged prostate with lower urinary tract symptoms: Secondary | ICD-10-CM | POA: Diagnosis not present

## 2020-02-16 DIAGNOSIS — N138 Other obstructive and reflux uropathy: Secondary | ICD-10-CM | POA: Diagnosis not present

## 2020-02-16 DIAGNOSIS — C61 Malignant neoplasm of prostate: Secondary | ICD-10-CM | POA: Diagnosis not present

## 2020-02-16 NOTE — Progress Notes (Signed)
02/16/2020 11:03 AM   [name] Jan 31, 1941 [id number]  Referring provider: Leone Haven, MD [address] [place],  [place] [address]  Chief Complaint  Patient presents with  . Prostate Cancer    HPI: [name] is a 79 year old male with BPH/ history of urethral stricture/ prostate cancer returns today for 53-month follow-up.   He has a personal history of BPH status post holep/cystolitholapaxy in 10/2017. He was doing well but ultimately developed recurrent gross hematuria secondary to dystrophic calcifications and was also found to have a bulbar urethral stricture. He returned to the operating room on 11/17/2018 for dilation of bulbar urethral stricture. Foley was left in place for 3 days.  He does also have a personal history of elevated PSA status post previous negative biopsy but found to have incidental prostate cancer at the time of outlet surgery.The surgical pathology showed 6 7 g of resected tissue, with incidental finding of Gleason 3+4, grade group 2 and less than 5% of the tissue. There was also acute and chronic inflammation.  He also has a personal history of elevated/fluctuating PSA.  He had a negative prostate biopsy in 2017.Preop prostate volume 120 cc.New baselinePSA is 3.4ng/mL from 11/15/19following surgery.   Since then, his PSA has been fluctuating.  His most recent PSA is 6.41 on 02/04/2020.  This is down from 6 months ago at 9.0 07/2019.  He underwent prostate MRI on 08/19/2018 which showed a nodular transition zone consistent with BPH, PI-RADS 1 and some incidental asymmetry of the seminal vesicle, otherwise no evidence of high-grade lesions. Newly calculated prostatic volume 56 cc.  He had a prostate bx on 08/12/19. His path report indicates2 of 12 coresinvolved low volume Gleason 3+3 up to 6% of tissue primarily at right mid and right apex. Previous cysto revealed regrowth of right lobe only which was also appreciated on TRUS.  TRUS 70 g.  Today, symptoms are fairly well controlled.  IPSS as below.  He also underwent cystoscopy in 08/2019 for gross hematuria.  He had right lateral prostatic regrowth of friable mucosa which is felt to be likely source of intermittent bleeding.  No recurrent gross hematuria episodes.  He reports today that he is being referred to gastroenterology for an episode of rectal bleeding x2.  This was associated with straining.  He believes he will likely need a colonoscopy.  On further evaluation of his rectal bleeding, he did have a rectal exam by his primary care physician.  He did notice a nodule on exam.  He also notes today that his creatinine was slightly worse and is now seeing Dr. Juleen China.  He had a renal ultrasound in November which showed normal kidneys bilaterally without hydronephrosis.  Known prostamegaly appreciated.   IPSS    Row Name 02/16/20 1000         International Prostate Symptom Score   How often have you had the sensation of not emptying your bladder? Not at All     How often have you had to urinate less than every two hours? Less than 1 in 5 times     How often have you found you stopped and started again several times when you urinated? Less than half the time     How often have you found it difficult to postpone urination? Almost always     How often have you had a weak urinary stream? Not at All     How often have you had to strain to  start urination? Not at All     How many times did you typically get up at night to urinate? 3 Times     Total IPSS Score 11           Quality of Life due to urinary symptoms   If you were to spend the rest of your life with your urinary condition just the way it is now how would you feel about that? Mostly Satisfied            Score:  1-7 Mild 8-19 Moderate 20-35 Severe   PMH: Past Medical History:  Diagnosis Date  . Allergy   . Anxiety   . Arthritis   . Cataract    beginning stage bilateral  . Elevated PSA    . GERD (gastroesophageal reflux disease)   . Gout   . History of kidney stones   . Hyperlipidemia   . Hypertension   . Inguinal hernia     Surgical History: Past Surgical History:  Procedure Laterality Date  . BACK SURGERY  1980's   L4/5  . BALLOON DILATION N/A 11/17/2018   Procedure: URETHRAL BALLOON DILATION;  Surgeon: Hollice Espy, MD;  Location: [facility];  Service: Urology;  Laterality: N/A;  . CARPAL TUNNEL RELEASE Right 08/15/2016   Procedure: CARPAL TUNNEL RELEASE ENDOSCOPIC;  Surgeon: Corky Mull, MD;  Location: [place];  Service: Orthopedics;  Laterality: Right;  . COLONOSCOPY    . CYSTOSCOPY N/A 11/17/2018   Procedure: CYSTOSCOPY;  Surgeon: Hollice Espy, MD;  Location: [facility];  Service: Urology;  Laterality: N/A;  . CYSTOSCOPY WITH LITHOLAPAXY N/A 10/09/2017   Procedure: CYSTOSCOPY WITH LITHOLAPAXY;  Surgeon: Hollice Espy, MD;  Location: [facility];  Service: Urology;  Laterality: N/A;  . ESOPHAGOGASTRODUODENOSCOPY  11-21-06   Esophagitis, Stricture/dilated   . HOLEP-LASER ENUCLEATION OF THE PROSTATE WITH MORCELLATION N/A 10/09/2017   Procedure: HOLEP-LASER ENUCLEATION OF THE PROSTATE WITH MORCELLATION;  Surgeon: Hollice Espy, MD;  Location: [facility];  Service: Urology;  Laterality: N/A;  . LYMPH NODE BIOPSY  1957   benign  lymph node removed  . Right arm fracture    . TONSILLECTOMY    . TOTAL KNEE ARTHROPLASTY  09-02-02   Bilateral  . UPPER GASTROINTESTINAL ENDOSCOPY      Home Medications:  Allergies as of 02/16/2020      Reactions   Penicillins Rash   Did it involve swelling of the face/tongue/throat, SOB, or low BP? No Did it involve sudden or severe rash/hives, skin peeling, or any reaction on the inside of your mouth or nose? No Did you need to seek medical attention at a hospital or doctor's office? No When did it last happen?50 Years If all above answers are "NO", may proceed with cephalosporin use.   Acetaminophen Itching    Aspirin Itching   OK w/low dose aspirin       Medication List       Accurate as of February 16, 2020 11:03 AM. If you have any questions, ask your nurse or doctor.        amLODipine 10 MG tablet Commonly known as: NORVASC Take 1 tablet (10 mg total) by mouth at bedtime.   aspirin 81 MG tablet Take 81 mg by mouth daily.   [employer] Spectravite Senior Tabs Take 1 tablet by mouth daily.   metoprolol succinate 50 MG 24 hr tablet Commonly known as: TOPROL-XL TAKE 1 TABLET BY MOUTH EVERY DAY   pantoprazole 40 MG tablet Commonly known as:  PROTONIX Take 1 tablet (40 mg total) by mouth 2 (two) times daily.   TART CHERRY ADVANCED PO Take 425 mg by mouth daily.       Allergies:  Allergies  Allergen Reactions  . Penicillins Rash    Did it involve swelling of the face/tongue/throat, SOB, or low BP? No Did it involve sudden or severe rash/hives, skin peeling, or any reaction on the inside of your mouth or nose? No Did you need to seek medical attention at a hospital or doctor's office? No When did it last happen?50 Years If all above answers are "NO", may proceed with cephalosporin use.   . Acetaminophen Itching  . Aspirin Itching    OK w/low dose aspirin     Family History: Family History  Problem Relation Age of Onset  . Ulcers Father   . Diverticulitis Father   . Arthritis Father   . Hypertension Brother   . Obesity Brother   . Cancer Brother        throat  . Arthritis Mother   . Depression Mother        Anxiety  . Colon cancer Neg Hx   . Colon polyps Neg Hx   . Esophageal cancer Neg Hx   . Rectal cancer Neg Hx   . Stomach cancer Neg Hx   . Kidney disease Neg Hx   . Kidney cancer Neg Hx   . Prostate cancer Neg Hx     Social History:  reports that he has never smoked. He has never used smokeless tobacco. He reports that he does not drink alcohol and does not use drugs.   Physical Exam: BP (!) 195/83   Pulse 83   Wt 257 lb (116.6 kg)   BMI 37.95  kg/m   Constitutional:  Alert and oriented, No acute distress. HEENT:  AT, moist mucus membranes.  Trachea midline, no masses. Cardiovascular: No clubbing, cyanosis, or edema. Respiratory: Normal respiratory effort, no increased work of breathing. Skin: No rashes, bruises or suspicious lesions. Neurologic: Grossly intact, no focal deficits, moving all 4 extremities. Psychiatric: Normal mood and affect.  Laboratory Data: Lab Results  Component Value Date   CREATININE 1.42 02/04/2020    Lab Results  Component Value Date   PSA 6.41 (H) 02/04/2020   PSA 5.45 (H) 07/20/2015   PSA 6.58 (H) 07/16/2014     Pertinent Imaging: Results for orders placed during the hospital encounter of 01/21/20  US RENAL  Narrative CLINICAL DATA:  Proteinuria and gross hematuria. Stage 3 kidney disease.  EXAM: RENAL / URINARY TRACT ULTRASOUND COMPLETE  COMPARISON:  None.  FINDINGS: Right Kidney:  Renal measurements: 12.0 x 5.4 x 4.2 cm = volume: 142.3 mL. Echogenicity within normal limits. No mass or hydronephrosis visualized.  Left Kidney:  Renal measurements: 12.1 x 6.8 x 5.9 cm = volume: 254.9 mL. Echogenicity within normal limits. No mass or hydronephrosis visualized.  Bladder:  Appears normal for degree of bladder distention.  Other:  Enlarged prostate measuring 5 x 4.6 x 5.1 cm, volume 60 mL. 1.1 cm cystic area within the superior aspect of the prostate, nonspecific.  IMPRESSION: 1. Normal sonographic appearance of the kidneys. 2. Enlarged prostate. Small cystic area in the upper aspect of the prostate gland, which is nonspecific. 3. Bladder mildly distended with no evidence of a mass or stone   Electronically Signed By: Lajean Manes M.D. On: 01/22/2020 10:16  Renal ultrasound images personally reviewed.  Agree with radiology interpretation.  Assessment & Plan:  1. Prostate cancer ([place]) Incidental prostate cancer with history of PSA fluctuation, small focus  of Gleason 3+4 Subsequent evaluation earlier this year with very benign appearing prostate MRI and follow-up biopsy with low risk prostate cancer  PSA is trending downwards which are reassuring  Given his age, comorbidities, and other factors, would recommend continued surveillance  We will plan for PSA DRE in 6 months  2. BPH with obstruction/lower urinary tract symptoms S/p Holep Symptoms are reasonably well controlled and stable after outlet procedure, will continue to monitor On no BPH meds  3. Stage 3 chronic kidney disease, unspecified whether stage 3a or 3b CKD ([place]) Based on renal ultrasound, does not appear that urinary obstruction is an underlying contributing factor   F/u 6 months with PSA / DRE  Hollice Espy, MD  [place] [address] [place], [place] [address] [phone]

## 2020-02-17 ENCOUNTER — Other Ambulatory Visit: Payer: Self-pay

## 2020-02-23 ENCOUNTER — Other Ambulatory Visit: Payer: Self-pay | Admitting: Family Medicine

## 2020-03-08 ENCOUNTER — Encounter: Payer: Self-pay | Admitting: Family Medicine

## 2020-03-23 ENCOUNTER — Telehealth: Payer: Self-pay | Admitting: Gastroenterology

## 2020-03-23 NOTE — Telephone Encounter (Signed)
Fine by me if okay with Dr. Hilarie Fredrickson, I only saw him for colonoscopy in the past. Thanks for asking.

## 2020-03-23 NOTE — Telephone Encounter (Signed)
Ok with me 

## 2020-03-23 NOTE — Telephone Encounter (Signed)
Good morning Dr. Havery Moros, we received a referral for this patient.  He was seen by you in 2019, but they are now requesting to see Dr. Hilarie Fredrickson, states he is a family friend of theirs and would feel more comfortable with him.  Would it be ok with you both?

## 2020-03-25 ENCOUNTER — Other Ambulatory Visit: Payer: Self-pay | Admitting: Family Medicine

## 2020-03-25 DIAGNOSIS — M109 Gout, unspecified: Secondary | ICD-10-CM

## 2020-03-28 ENCOUNTER — Other Ambulatory Visit: Payer: Self-pay | Admitting: Family Medicine

## 2020-03-28 DIAGNOSIS — I10 Essential (primary) hypertension: Secondary | ICD-10-CM

## 2020-03-30 ENCOUNTER — Encounter: Payer: Self-pay | Admitting: Internal Medicine

## 2020-03-31 NOTE — Telephone Encounter (Signed)
Thank you both, patient scheduled for 05/26/2020.

## 2020-04-05 DIAGNOSIS — M5136 Other intervertebral disc degeneration, lumbar region: Secondary | ICD-10-CM | POA: Diagnosis not present

## 2020-04-05 DIAGNOSIS — M9903 Segmental and somatic dysfunction of lumbar region: Secondary | ICD-10-CM | POA: Diagnosis not present

## 2020-04-05 DIAGNOSIS — M5033 Other cervical disc degeneration, cervicothoracic region: Secondary | ICD-10-CM | POA: Diagnosis not present

## 2020-04-05 DIAGNOSIS — M9901 Segmental and somatic dysfunction of cervical region: Secondary | ICD-10-CM | POA: Diagnosis not present

## 2020-04-06 ENCOUNTER — Encounter: Payer: Self-pay | Admitting: Family Medicine

## 2020-04-06 MED ORDER — COLCHICINE 0.6 MG PO CAPS: 0.6000 mg | ORAL_CAPSULE | Freq: Every day | ORAL | 0 refills | Status: DC | PRN

## 2020-04-06 NOTE — Telephone Encounter (Signed)
See other mychart message.

## 2020-04-11 ENCOUNTER — Other Ambulatory Visit: Payer: Self-pay | Admitting: Family Medicine

## 2020-05-18 ENCOUNTER — Encounter: Payer: Self-pay | Admitting: *Deleted

## 2020-05-20 DIAGNOSIS — Z96653 Presence of artificial knee joint, bilateral: Secondary | ICD-10-CM | POA: Diagnosis not present

## 2020-05-26 ENCOUNTER — Ambulatory Visit: Payer: Medicare PPO | Admitting: Internal Medicine

## 2020-05-26 ENCOUNTER — Encounter: Payer: Self-pay | Admitting: Internal Medicine

## 2020-05-26 VITALS — BP 128/70 | HR 64 | Ht 68.0 in | Wt 256.2 lb

## 2020-05-26 DIAGNOSIS — R195 Other fecal abnormalities: Secondary | ICD-10-CM

## 2020-05-26 DIAGNOSIS — K648 Other hemorrhoids: Secondary | ICD-10-CM | POA: Diagnosis not present

## 2020-05-26 NOTE — Progress Notes (Signed)
Patient ID: [name], male   DOB: [birth year], 80 y.o.   MRN: [id number] HPI: [name] is an 80 year old male with PMH of GERD, colon polyps, internal hemorrhoids, prostate cancer, HTN, HL who is seen to discuss intermittent red blood per rectum.  He is here alone today.  He was previously followed by Dr. Havery Moros for screening colonoscopy.  Colonoscopy was performed on 04/02/2017.  This was a complete exam with a good prep.  There was a 4 mm sigmoid polyp that was removed and found to be an adenoma.  There was diverticulosis in the entire colon with severe diverticulosis in the sigmoid.  There were internal hemorrhoids on retroflexion.  This issue with intermittent red blood per rectum has been noticed intermittently over the last several months.  He saw primary care for this in early December.  He was then referred back here.  Rectal exam at that time revealed normal external exam, right-sided prostate nodule and negative FOBT.  The patient reports that was seeing bright red blood previously with nearly every bowel movement which was hard or required any straining.  He noted that since retiring he was having less activity as well as less fresh fruit and vegetable in his diet.  His stools became harder.  He has subsequently added back fruit to his diet and his stools have softened somewhat.  Now he is only occasionally seeing red blood with wiping.  He reports that occasionally he will feel some stinging with red blood after bowel movement but for the most part this is painless.  He did feel a perianal bulge present for a few weeks after he was seen by primary care in December.  This has completely resolved.  There is no melena.  No abdominal pain.  No weight loss.  Good appetite.  No hepatobiliary or upper GI complaints.  Past Medical History:  Diagnosis Date  . Allergy   . Anxiety   . Arthritis   . Cataract    beginning stage bilateral  . Diverticulosis   . Elevated PSA   . Esophageal  stricture   . Esophagitis   . GERD (gastroesophageal reflux disease)   . Gout   . History of kidney stones   . Hyperlipidemia   . Hypertension   . Inguinal hernia   . Internal hemorrhoids   . Prostate cancer ([place])   . Tubular adenoma of colon     Past Surgical History:  Procedure Laterality Date  . BACK SURGERY  1980's   L4/5  . BALLOON DILATION N/A 11/17/2018   Procedure: URETHRAL BALLOON DILATION;  Surgeon: Hollice Espy, MD;  Location: [facility];  Service: Urology;  Laterality: N/A;  . CARPAL TUNNEL RELEASE Right 08/15/2016   Procedure: CARPAL TUNNEL RELEASE ENDOSCOPIC;  Surgeon: Corky Mull, MD;  Location: [facility] [place];  Service: Orthopedics;  Laterality: Right;  . COLONOSCOPY    . CYSTOSCOPY N/A 11/17/2018   Procedure: CYSTOSCOPY;  Surgeon: Hollice Espy, MD;  Location: [facility];  Service: Urology;  Laterality: N/A;  . CYSTOSCOPY WITH LITHOLAPAXY N/A 10/09/2017   Procedure: CYSTOSCOPY WITH LITHOLAPAXY;  Surgeon: Hollice Espy, MD;  Location: [facility];  Service: Urology;  Laterality: N/A;  . ESOPHAGOGASTRODUODENOSCOPY  11-21-06   Esophagitis, Stricture/dilated   . HOLEP-LASER ENUCLEATION OF THE PROSTATE WITH MORCELLATION N/A 10/09/2017   Procedure: HOLEP-LASER ENUCLEATION OF THE PROSTATE WITH MORCELLATION;  Surgeon: Hollice Espy, MD;  Location: [facility];  Service: Urology;  Laterality: N/A;  . LYMPH NODE BIOPSY  1957   benign  lymph node removed  . Right arm fracture    . TONSILLECTOMY    . TOTAL KNEE ARTHROPLASTY  09-02-02   Bilateral  . UPPER GASTROINTESTINAL ENDOSCOPY      Outpatient Medications Prior to Visit  Medication Sig Dispense Refill  . amLODipine (NORVASC) 10 MG tablet TAKE 1 TABLET BY MOUTH EVERYDAY AT BEDTIME 90 tablet 1  . aspirin 81 MG tablet Take 81 mg by mouth daily.    . metoprolol succinate (TOPROL-XL) 50 MG 24 hr tablet TAKE 1 TABLET BY MOUTH EVERY DAY 90 tablet 1  . Misc Natural Products (TART CHERRY ADVANCED PO) Take 425 mg by mouth  daily.    Marland Kitchen MITIGARE 0.6 MG CAPS TAKE 0.6 MG BY MOUTH DAILY AS NEEDED (GOUT FLARE). 90 capsule 1  . Multiple Vitamins-Minerals (CVS SPECTRAVITE SENIOR) TABS Take 1 tablet by mouth daily.     . pantoprazole (PROTONIX) 40 MG tablet Take 1 tablet (40 mg total) by mouth 2 (two) times daily. 180 tablet 3   Facility-Administered Medications Prior to Visit  Medication Dose Route Frequency Provider Last Rate Last Admin  . 0.9 %  sodium chloride infusion  500 mL Intravenous Once Armbruster, Carlota Raspberry, MD        Allergies  Allergen Reactions  . Penicillins Rash    Did it involve swelling of the face/tongue/throat, SOB, or low BP? No Did it involve sudden or severe rash/hives, skin peeling, or any reaction on the inside of your mouth or nose? No Did you need to seek medical attention at a hospital or doctor's office? No When did it last happen?50 Years If all above answers are "NO", may proceed with cephalosporin use.   . Acetaminophen Itching  . Aspirin Itching    OK w/low dose aspirin     Family History  Problem Relation Age of Onset  . Ulcers Father   . Diverticulitis Father   . Arthritis Father   . Hypertension Brother   . Obesity Brother   . Cancer Brother        throat  . Arthritis Mother   . Depression Mother        Anxiety  . Colon cancer Neg Hx   . Colon polyps Neg Hx   . Esophageal cancer Neg Hx   . Rectal cancer Neg Hx   . Stomach cancer Neg Hx   . Kidney disease Neg Hx   . Kidney cancer Neg Hx   . Prostate cancer Neg Hx     Social History   Tobacco Use  . Smoking status: Never Smoker  . Smokeless tobacco: Never Used  Vaping Use  . Vaping Use: Never used  Substance Use Topics  . Alcohol use: No  . Drug use: No    ROS: As per history of present illness, otherwise negative  BP 128/70   Pulse 64   Ht 5\' 8"  (1.727 m)   Wt 256 lb 3.2 oz (116.2 kg)   SpO2 100%   BMI 38.96 kg/m  Gen: awake, alert, NAD HEENT: anicteric Neuro: nonfocal   RELEVANT  LABS AND IMAGING: CBC    Component Value Date/Time   WBC 7.6 02/04/2020 1238   RBC 4.70 02/04/2020 1238   HGB 14.2 02/04/2020 1238   HGB 13.7 08/29/2013 2100   HCT 42.4 02/04/2020 1238   HCT 41.6 08/29/2013 2100   PLT 205.0 02/04/2020 1238   PLT 199 08/29/2013 2100   MCV 90.3 02/04/2020 1238   MCV  91 08/29/2013 2100   MCH 29.6 11/13/2018 1133   MCHC 33.5 02/04/2020 1238   RDW 13.7 02/04/2020 1238   RDW 13.7 08/29/2013 2100   LYMPHSABS 2.4 07/20/2015 1015   LYMPHSABS 1.4 08/29/2013 2100   MONOABS 0.6 07/20/2015 1015   MONOABS 1.0 08/29/2013 2100   EOSABS 0.6 07/20/2015 1015   EOSABS 0.1 08/29/2013 2100   BASOSABS 0.0 07/20/2015 1015   BASOSABS 0.1 08/29/2013 2100    CMP     Component Value Date/Time   NA 138 02/04/2020 1238   NA 137 09/01/2013 0410   K 4.5 02/04/2020 1238   K 3.6 09/01/2013 0410   CL 102 02/04/2020 1238   CL 106 09/01/2013 0410   CO2 30 02/04/2020 1238   CO2 23 09/01/2013 0410   GLUCOSE 92 02/04/2020 1238   GLUCOSE 107 (H) 09/01/2013 0410   BUN 20 02/04/2020 1238   BUN 13 06/24/2017 1039   BUN 14 09/01/2013 0410   CREATININE 1.42 02/04/2020 1238   CREATININE 1.40 (H) 09/01/2013 0410   CALCIUM 9.2 02/04/2020 1238   CALCIUM 8.0 (L) 09/01/2013 0410   PROT 7.5 11/04/2018 1030   PROT 8.1 08/29/2013 2100   ALBUMIN 4.1 11/04/2018 1030   ALBUMIN 3.7 08/29/2013 2100   AST 25 11/04/2018 1030   AST 35 08/29/2013 2100   ALT 21 11/04/2018 1030   ALT 34 08/29/2013 2100   ALKPHOS 75 11/04/2018 1030   ALKPHOS 81 08/29/2013 2100   BILITOT 0.7 11/04/2018 1030   BILITOT 1.1 (H) 08/29/2013 2100   GFRNONAA 50 (L) 10/04/2017 1306   GFRNONAA 49 (L) 09/01/2013 0410   GFRAA 57 (L) 10/04/2017 1306   GFRAA 57 (L) 09/01/2013 0410    ASSESSMENT/PLAN: 80 year old male with PMH of GERD, colon polyps, internal hemorrhoids, prostate cancer, HTN, HL who is seen to discuss intermittent red blood per rectum.   1.  Hemorrhoidal bleeding/hard stool --his intermittent  red blood per rectum is consistent with internal hemorrhoidal bleeding.  We discussed this today.  He does have known internal hemorrhoids and is up-to-date with his colonoscopy.  Given the nature and description of his bleeding I do not feel that we need to repeat direct visualization at this time.  I do think that he should begin fiber supplement to soften stool.  If hemorrhoidal bleeding continues and I would recommend that we consider hemorrhoidal banding --Begin Metamucil or Benefiber 1-2 heaping tablespoons daily --Patient asked to notify my office if rectal bleeding continues or becomes more frequent, he voices understanding      Cc:Leone Haven, Md [address] [place],  [place] [address]

## 2020-05-26 NOTE — Patient Instructions (Signed)
We have given you a fiber handout to look over and follow.  Please take one of the fiber supplements mentioned on your fiber handout- 1 heaping tablespoon daily.  Call our office should your bleeding persist or increase.  If you are age 81 or older, your body mass index should be between 23-30. Your Body mass index is 38.96 kg/m. If this is out of the aforementioned range listed, please consider follow up with your Primary Care Provider.  Due to recent changes in healthcare laws, you may see the results of your imaging and laboratory studies on MyChart before your provider has had a chance to review them.  We understand that in some cases there may be results that are confusing or concerning to you. Not all laboratory results come back in the same time frame and the provider may be waiting for multiple results in order to interpret others.  Please give [place] 48 hours in order for your provider to thoroughly review all the results before contacting the office for clarification of your results.

## 2020-06-09 DIAGNOSIS — N1832 Chronic kidney disease, stage 3b: Secondary | ICD-10-CM | POA: Diagnosis not present

## 2020-06-09 DIAGNOSIS — R800 Isolated proteinuria: Secondary | ICD-10-CM | POA: Diagnosis not present

## 2020-06-09 DIAGNOSIS — I10 Essential (primary) hypertension: Secondary | ICD-10-CM | POA: Diagnosis not present

## 2020-06-14 DIAGNOSIS — M5136 Other intervertebral disc degeneration, lumbar region: Secondary | ICD-10-CM | POA: Diagnosis not present

## 2020-06-14 DIAGNOSIS — I10 Essential (primary) hypertension: Secondary | ICD-10-CM | POA: Diagnosis not present

## 2020-06-14 DIAGNOSIS — N1832 Chronic kidney disease, stage 3b: Secondary | ICD-10-CM | POA: Diagnosis not present

## 2020-06-14 DIAGNOSIS — R809 Proteinuria, unspecified: Secondary | ICD-10-CM | POA: Diagnosis not present

## 2020-06-14 DIAGNOSIS — M9903 Segmental and somatic dysfunction of lumbar region: Secondary | ICD-10-CM | POA: Diagnosis not present

## 2020-06-14 DIAGNOSIS — M9901 Segmental and somatic dysfunction of cervical region: Secondary | ICD-10-CM | POA: Diagnosis not present

## 2020-06-14 DIAGNOSIS — M5033 Other cervical disc degeneration, cervicothoracic region: Secondary | ICD-10-CM | POA: Diagnosis not present

## 2020-07-12 DIAGNOSIS — M9903 Segmental and somatic dysfunction of lumbar region: Secondary | ICD-10-CM | POA: Diagnosis not present

## 2020-07-12 DIAGNOSIS — M5136 Other intervertebral disc degeneration, lumbar region: Secondary | ICD-10-CM | POA: Diagnosis not present

## 2020-07-12 DIAGNOSIS — M5033 Other cervical disc degeneration, cervicothoracic region: Secondary | ICD-10-CM | POA: Diagnosis not present

## 2020-07-12 DIAGNOSIS — M9901 Segmental and somatic dysfunction of cervical region: Secondary | ICD-10-CM | POA: Diagnosis not present

## 2020-07-30 ENCOUNTER — Other Ambulatory Visit: Payer: Self-pay | Admitting: Family Medicine

## 2020-08-09 DIAGNOSIS — M5033 Other cervical disc degeneration, cervicothoracic region: Secondary | ICD-10-CM | POA: Diagnosis not present

## 2020-08-09 DIAGNOSIS — M9903 Segmental and somatic dysfunction of lumbar region: Secondary | ICD-10-CM | POA: Diagnosis not present

## 2020-08-09 DIAGNOSIS — M9901 Segmental and somatic dysfunction of cervical region: Secondary | ICD-10-CM | POA: Diagnosis not present

## 2020-08-09 DIAGNOSIS — M5136 Other intervertebral disc degeneration, lumbar region: Secondary | ICD-10-CM | POA: Diagnosis not present

## 2020-08-16 ENCOUNTER — Other Ambulatory Visit: Payer: Self-pay

## 2020-08-16 ENCOUNTER — Encounter: Payer: Self-pay | Admitting: Family Medicine

## 2020-08-16 ENCOUNTER — Ambulatory Visit: Payer: Medicare PPO | Admitting: Family Medicine

## 2020-08-16 DIAGNOSIS — M1000 Idiopathic gout, unspecified site: Secondary | ICD-10-CM

## 2020-08-16 DIAGNOSIS — R5383 Other fatigue: Secondary | ICD-10-CM | POA: Diagnosis not present

## 2020-08-16 DIAGNOSIS — I10 Essential (primary) hypertension: Secondary | ICD-10-CM | POA: Diagnosis not present

## 2020-08-16 DIAGNOSIS — C61 Malignant neoplasm of prostate: Secondary | ICD-10-CM | POA: Diagnosis not present

## 2020-08-16 NOTE — Assessment & Plan Note (Signed)
This seems to be related to slight deconditioning.  He is able to do a fair amount of physical activity before having to rest.  Discussed this is not unexpected given that he is doing less physical work than he had previously since he retired.  Advised to monitor and if it worsens to where he is becoming fatigued quickly or if he develops chest pain or shortness of breath he needs to let us know immediately.

## 2020-08-16 NOTE — Assessment & Plan Note (Signed)
No recent issues.  I discussed that he could take the [place] on an as-needed basis for gout flares.  Discussed if he starts to have more than 1 flare per year we could consider starting on medication to reduce his uric acid.

## 2020-08-16 NOTE — Progress Notes (Signed)
Duane Rumps, MD Phone: [phone]  [name] is a 80 y.o. male who presents today for f/u.  HYPERTENSION Disease Monitoring Home BP Monitoring 135-145 at home though he checked his cuff against his sister-in-laws cuff and his cuff ran 15 points higher Chest pain- no    Dyspnea- no Medications Compliance-  taking amlodipine, metoprolol.   Edema- no  Decreased energy: Patient notes since he retired he has had a little less energy for physical activity.  He has remained active with doing yard work 4 to 5 days a week and he can go for 30 to 45 minutes at a time and then has to take a rest.  He notes that is fairly similar to when he was working.  He notes no exertional chest pain or shortness of breath.  Gout: Patient notes no recent flares.  He has been taking Mitigare every other day.   Social History   Tobacco Use  Smoking Status Never  Smokeless Tobacco Never    Current Outpatient Medications on File Prior to Visit  Medication Sig Dispense Refill   amLODipine (NORVASC) 10 MG tablet TAKE 1 TABLET BY MOUTH EVERYDAY AT BEDTIME 90 tablet 1   aspirin 81 MG tablet Take 81 mg by mouth daily.     metoprolol succinate (TOPROL-XL) 50 MG 24 hr tablet TAKE 1 TABLET BY MOUTH EVERY DAY 90 tablet 1   Misc Natural Products (TART CHERRY ADVANCED PO) Take 425 mg by mouth daily.     MITIGARE 0.6 MG CAPS TAKE 0.6 MG BY MOUTH DAILY AS NEEDED (GOUT FLARE). 90 capsule 1   Multiple Vitamins-Minerals (CVS SPECTRAVITE SENIOR) TABS Take 1 tablet by mouth daily.      pantoprazole (PROTONIX) 40 MG tablet TAKE 1 TABLET BY MOUTH TWICE A DAY 180 tablet 3   Current Facility-Administered Medications on File Prior to Visit  Medication Dose Route Frequency Provider Last Rate Last Admin   0.9 %  sodium chloride infusion  500 mL Intravenous Once Armbruster, Carlota Raspberry, MD         ROS see history of present illness  Objective  Physical Exam Vitals:   08/16/20 0830  BP: 122/70  Pulse: 68  Temp:  97.7 F (36.5 C)  SpO2: 97%    BP Readings from Last 3 Encounters:  08/16/20 122/70  05/26/20 128/70  02/16/20 (!) 195/83   Wt Readings from Last 3 Encounters:  08/16/20 264 lb (119.7 kg)  05/26/20 256 lb 3.2 oz (116.2 kg)  02/16/20 257 lb (116.6 kg)    Physical Exam Constitutional:      General: He is not in acute distress.    Appearance: He is not diaphoretic.  Cardiovascular:     Rate and Rhythm: Normal rate and regular rhythm.     Heart sounds: Normal heart sounds.  Pulmonary:     Effort: Pulmonary effort is normal.     Breath sounds: Normal breath sounds.  Skin:    General: Skin is warm and dry.  Neurological:     Mental Status: He is alert.     Assessment/Plan: Please see individual problem list.  Problem List Items Addressed This Visit     Essential hypertension    Well-controlled.  He will continue amlodipine 10 mg once daily and metoprolol 50 mg daily.       Prostate cancer [facility])    Patient reports he has follow-up with urology later this month.       Gout    No recent issues.  I discussed that he could take the [place] on an as-needed basis for gout flares.  Discussed if he starts to have more than 1 flare per year we could consider starting on medication to reduce his uric acid.       Decreased energy    This seems to be related to slight deconditioning.  He is able to do a fair amount of physical activity before having to rest.  Discussed this is not unexpected given that he is doing less physical work than he had previously since he retired.  Advised to monitor and if it worsens to where he is becoming fatigued quickly or if he develops chest pain or shortness of breath he needs to let us know immediately.        Return in about 6 months (around 02/15/2021) for Follow-up with labs.  This visit occurred during the SARS-CoV-2 public health emergency.  Safety protocols were in place, including screening questions prior to the visit, additional  usage of staff PPE, and extensive cleaning of exam room while observing appropriate contact time as indicated for disinfecting solutions.    Duane Rumps, MD [place]

## 2020-08-16 NOTE — Assessment & Plan Note (Signed)
Well-controlled.  He will continue amlodipine 10 mg once daily and metoprolol 50 mg daily.

## 2020-08-16 NOTE — Patient Instructions (Addendum)
Nice to see you. Please remain active. If you develop increased fatigue, chest pain, or shortness of breath please let us know. Please only use your Mitigare as needed.

## 2020-08-16 NOTE — Assessment & Plan Note (Signed)
Patient reports he has follow-up with urology later this month.

## 2020-08-18 ENCOUNTER — Ambulatory Visit (INDEPENDENT_AMBULATORY_CARE_PROVIDER_SITE_OTHER): Payer: Medicare PPO | Admitting: Urology

## 2020-08-18 ENCOUNTER — Encounter: Payer: Self-pay | Admitting: Urology

## 2020-08-18 ENCOUNTER — Other Ambulatory Visit: Payer: Self-pay

## 2020-08-18 VITALS — BP 164/75 | HR 80 | Ht 69.0 in | Wt 264.0 lb

## 2020-08-18 DIAGNOSIS — C61 Malignant neoplasm of prostate: Secondary | ICD-10-CM

## 2020-08-18 DIAGNOSIS — R350 Frequency of micturition: Secondary | ICD-10-CM

## 2020-08-18 DIAGNOSIS — R3129 Other microscopic hematuria: Secondary | ICD-10-CM | POA: Diagnosis not present

## 2020-08-18 NOTE — Progress Notes (Signed)
08/18/2020 10:05 AM   [name] [birth year] [id number]  Referring provider: Leone Haven, MD [place],  [place] [address]  Chief Complaint  Patient presents with   Prostate Cancer    23mo follow up w/PSA    HPI: 80 year old male with a personal history of BPH/history of urethral strictures/prostate cancer returns for 71-month follow-up.  He has a personal history of BPH status post holep/cystolitholapaxy in 10/2017.  He was doing well but ultimately developed recurrent gross hematuria secondary to dystrophic calcifications and was also found to have a bulbar urethral stricture.  He returned to the operating room on 11/17/2018 for dilation of bulbar urethral stricture.  Foley was left in place for 3 days.   He does also have a personal history of elevated PSA status post previous negative biopsy but found to have incidental prostate cancer at the time of outlet surgery.The surgical pathology showed 6 7 g of resected tissue, with incidental finding of Gleason 3+4, grade group 2 and less than 5% of the tissue. There was also acute and chronic inflammation.    He also has a personal history of elevated/fluctuating PSA.  He had a negative prostate biopsy in 2017.  Preop prostate volume 120 cc.   New baseline PSA is 3.4 ng/mL from 01/17/18 following surgery.   Since then, his PSA has been fluctuating.  His most recent PSA today is pending.     He underwent prostate MRI on 08/19/2018 which showed a nodular transition zone consistent with BPH, PI-RADS 1 and some incidental asymmetry of the seminal vesicle, otherwise no evidence of high-grade lesions.  Newly calculated prostatic volume 56 cc.   He had a prostate bx on 08/12/19. His path report indicates 2 of 12 cores involved low volume Gleason 3+3 up to 6% of tissue primarily at right mid and right apex. Previous cysto revealed regrowth of right lobe only which was also appreciated on TRUS. TRUS 70 g.  Today, he thinks  he may have a UTI.  He is having urinary frequency and dysuria over the past 2 days.  He has been trying to drink plenty of water.  Today, his dysuria has resolved but he still has some frequency.  He did not have his PSA drawn today.  He also has a personal history of gross hematuria.  He last underwent cystoscopy 12 months ago with friable regrowth of his right prostatic lobe.  He also had a renal ultrasound on 01/2020 which showed no upper tract issues.     PMH: Past Medical History:  Diagnosis Date   Allergy    Anxiety    Arthritis    Cataract    beginning stage bilateral   Diverticulosis    Elevated PSA    Esophageal stricture    Esophagitis    GERD (gastroesophageal reflux disease)    Gout    History of kidney stones    Hyperlipidemia    Hypertension    Inguinal hernia    Internal hemorrhoids    Prostate cancer ([place])    Tubular adenoma of colon     Surgical History: Past Surgical History:  Procedure Laterality Date   BACK SURGERY  1980's   L4/5   BALLOON DILATION N/A 11/17/2018   Procedure: URETHRAL BALLOON DILATION;  Surgeon: Hollice Espy, MD;  Location: [facility];  Service: Urology;  Laterality: N/A;   CARPAL TUNNEL RELEASE Right 08/15/2016   Procedure: CARPAL TUNNEL RELEASE ENDOSCOPIC;  Surgeon: Corky Mull, MD;  Location: [place];  Service: Orthopedics;  Laterality: Right;   COLONOSCOPY     CYSTOSCOPY N/A 11/17/2018   Procedure: CYSTOSCOPY;  Surgeon: Hollice Espy, MD;  Location: [facility];  Service: Urology;  Laterality: N/A;   CYSTOSCOPY WITH LITHOLAPAXY N/A 10/09/2017   Procedure: CYSTOSCOPY WITH LITHOLAPAXY;  Surgeon: Hollice Espy, MD;  Location: [facility];  Service: Urology;  Laterality: N/A;   ESOPHAGOGASTRODUODENOSCOPY  11-21-06   Esophagitis, Stricture/dilated    HOLEP-LASER ENUCLEATION OF THE PROSTATE WITH MORCELLATION N/A 10/09/2017   Procedure: HOLEP-LASER ENUCLEATION OF THE PROSTATE WITH MORCELLATION;  Surgeon: Hollice Espy, MD;   Location: [facility];  Service: Urology;  Laterality: N/A;   LYMPH NODE BIOPSY  1957   benign  lymph node removed   Right arm fracture     TONSILLECTOMY     TOTAL KNEE ARTHROPLASTY  09-02-02   Bilateral   UPPER GASTROINTESTINAL ENDOSCOPY      Home Medications:  Allergies as of 08/18/2020       Reactions   Penicillins Rash   Did it involve swelling of the face/tongue/throat, SOB, or low BP? No Did it involve sudden or severe rash/hives, skin peeling, or any reaction on the inside of your mouth or nose? No Did you need to seek medical attention at a hospital or doctor's office? No When did it last happen?      50 Years If all above answers are "NO", may proceed with cephalosporin use.   Acetaminophen Itching   Aspirin Itching   OK w/low dose aspirin         Medication List        Accurate as of August 18, 2020 11:59 PM. If you have any questions, ask your nurse or doctor.          amLODipine 10 MG tablet Commonly known as: NORVASC TAKE 1 TABLET BY MOUTH EVERYDAY AT BEDTIME   aspirin 81 MG tablet Take 81 mg by mouth daily.   CVS Spectravite Senior Tabs Take 1 tablet by mouth daily.   metoprolol succinate 50 MG 24 hr tablet Commonly known as: TOPROL-XL TAKE 1 TABLET BY MOUTH EVERY DAY   Mitigare 0.6 MG Caps Generic drug: Colchicine TAKE 0.6 MG BY MOUTH DAILY AS NEEDED (GOUT FLARE).   pantoprazole 40 MG tablet Commonly known as: PROTONIX TAKE 1 TABLET BY MOUTH TWICE A DAY   TART CHERRY ADVANCED PO Take 425 mg by mouth daily.        Allergies:  Allergies  Allergen Reactions   Penicillins Rash    Did it involve swelling of the face/tongue/throat, SOB, or low BP? No Did it involve sudden or severe rash/hives, skin peeling, or any reaction on the inside of your mouth or nose? No Did you need to seek medical attention at a hospital or doctor's office? No When did it last happen?      50 Years If all above answers are "NO", may proceed with cephalosporin  use.    Acetaminophen Itching   Aspirin Itching    OK w/low dose aspirin     Family History: Family History  Problem Relation Age of Onset   Ulcers Father    Diverticulitis Father    Arthritis Father    Hypertension Brother    Obesity Brother    Cancer Brother        throat   Arthritis Mother    Depression Mother        Anxiety   Colon cancer Neg Hx  Colon polyps Neg Hx    Esophageal cancer Neg Hx    Rectal cancer Neg Hx    Stomach cancer Neg Hx    Kidney disease Neg Hx    Kidney cancer Neg Hx    Prostate cancer Neg Hx     Social History:  reports that he has never smoked. He has never used smokeless tobacco. He reports that he does not drink alcohol and does not use drugs.   Physical Exam: BP (!) 164/75   Pulse 80   Ht 5\' 9"  (1.753 m)   Wt 264 lb (119.7 kg)   BMI 38.99 kg/m   Constitutional:  Alert and oriented, No acute distress. HEENT: [place] AT, moist mucus membranes.  Trachea midline, no masses. Cardiovascular: No clubbing, cyanosis, or edema. Respiratory: Normal respiratory effort, no increased work of breathing. DRE: Normal sphincter tone.  Enlarged 50+ cc prostate without any firm nodularity. Skin: No rashes, bruises or suspicious lesions. Neurologic: Grossly intact, no focal deficits, moving all 4 extremities. Psychiatric: Normal mood and affect.  Laboratory Data: Lab Results  Component Value Date   WBC 7.6 02/04/2020   HGB 14.2 02/04/2020   HCT 42.4 02/04/2020   MCV 90.3 02/04/2020   PLT 205.0 02/04/2020    Lab Results  Component Value Date   CREATININE 1.42 02/04/2020   PSA pending  Urinalysis today is unremarkable other than for 30 RBCs per high-powered field  Assessment & Plan:    1. Prostate cancer [facility]) Rectal exam today is stable  Did not have his PSA drawn beforehand, will call him tomorrow with his results.  In light of biopsy last year as well as MRI and other fairly aggressive surveillance along with his age, will likely not  intervene unless there is a dramatic rise in his PSA today.  We will continue to follow him on a every 6 month basis.  Consider repeat MRI in 6 months if his PSA continues to rise steadily.  Will continue active surveillance for the time being. - PSA - PSA; Future  2. Microscopic hematuria Greater than 30 RBCs per high-power field but otherwise unremarkable  He said fairly extensive hematuria evaluations with cystoscopy as recently as last year and recent upper tract imaging which is unremarkable.  He has a known right friable prostatic mucosa and as such, this is likely the source.  We will recheck his urine in 6 months, consider repeat cystoscopy at that point in time if he has persistent hematuria or develops gross hematuria in the interim.  3. Frequent urination Likely behavioral, drinking more water in the setting of recent dysuria which has now resolved  No evidence of infection is contributing factor  Advised to let us know if his symptoms fail to resolve - Urinalysis, Complete    Follow-up in 6 months with PSA/IPSS/PVR/UA   Hollice Espy, MD  [facility] [address], [place], [place] [address] [phone]

## 2020-08-19 ENCOUNTER — Telehealth: Payer: Self-pay

## 2020-08-19 ENCOUNTER — Encounter: Payer: Self-pay | Admitting: Family Medicine

## 2020-08-19 LAB — URINALYSIS, COMPLETE
Bilirubin, UA: NEGATIVE
Glucose, UA: NEGATIVE
Leukocytes,UA: NEGATIVE
Nitrite, UA: NEGATIVE
Specific Gravity, UA: 1.025 (ref 1.005–1.030)
Urobilinogen, Ur: 0.2 mg/dL (ref 0.2–1.0)
pH, UA: 5 (ref 5.0–7.5)

## 2020-08-19 LAB — MICROSCOPIC EXAMINATION
Bacteria, UA: NONE SEEN
RBC, Urine: >30 /hpf — AB (ref 0–2)

## 2020-08-19 LAB — PSA: Prostate Specific Ag, Serum: 9.6 ng/mL — ABNORMAL HIGH (ref 0.0–4.0)

## 2020-08-19 NOTE — Telephone Encounter (Signed)
Added medication, patient request on mychart.  [name],cma

## 2020-08-29 ENCOUNTER — Other Ambulatory Visit: Payer: Self-pay | Admitting: Family Medicine

## 2020-08-30 DIAGNOSIS — M5033 Other cervical disc degeneration, cervicothoracic region: Secondary | ICD-10-CM | POA: Diagnosis not present

## 2020-08-30 DIAGNOSIS — M9901 Segmental and somatic dysfunction of cervical region: Secondary | ICD-10-CM | POA: Diagnosis not present

## 2020-08-30 DIAGNOSIS — M5136 Other intervertebral disc degeneration, lumbar region: Secondary | ICD-10-CM | POA: Diagnosis not present

## 2020-08-30 DIAGNOSIS — M9903 Segmental and somatic dysfunction of lumbar region: Secondary | ICD-10-CM | POA: Diagnosis not present

## 2020-09-06 DIAGNOSIS — M5033 Other cervical disc degeneration, cervicothoracic region: Secondary | ICD-10-CM | POA: Diagnosis not present

## 2020-09-06 DIAGNOSIS — M9901 Segmental and somatic dysfunction of cervical region: Secondary | ICD-10-CM | POA: Diagnosis not present

## 2020-09-06 DIAGNOSIS — M5136 Other intervertebral disc degeneration, lumbar region: Secondary | ICD-10-CM | POA: Diagnosis not present

## 2020-09-06 DIAGNOSIS — M9903 Segmental and somatic dysfunction of lumbar region: Secondary | ICD-10-CM | POA: Diagnosis not present

## 2020-09-08 DIAGNOSIS — R809 Proteinuria, unspecified: Secondary | ICD-10-CM | POA: Diagnosis not present

## 2020-09-08 DIAGNOSIS — I10 Essential (primary) hypertension: Secondary | ICD-10-CM | POA: Diagnosis not present

## 2020-09-08 DIAGNOSIS — N1832 Chronic kidney disease, stage 3b: Secondary | ICD-10-CM | POA: Diagnosis not present

## 2020-09-15 ENCOUNTER — Encounter: Payer: Self-pay | Admitting: Urology

## 2020-09-15 DIAGNOSIS — R809 Proteinuria, unspecified: Secondary | ICD-10-CM | POA: Diagnosis not present

## 2020-09-15 DIAGNOSIS — R3129 Other microscopic hematuria: Secondary | ICD-10-CM | POA: Diagnosis not present

## 2020-09-15 DIAGNOSIS — I10 Essential (primary) hypertension: Secondary | ICD-10-CM | POA: Diagnosis not present

## 2020-09-15 DIAGNOSIS — N1831 Chronic kidney disease, stage 3a: Secondary | ICD-10-CM | POA: Diagnosis not present

## 2020-09-15 DIAGNOSIS — N200 Calculus of kidney: Secondary | ICD-10-CM | POA: Diagnosis not present

## 2020-09-15 DIAGNOSIS — M1000 Idiopathic gout, unspecified site: Secondary | ICD-10-CM | POA: Diagnosis not present

## 2020-09-15 DIAGNOSIS — N400 Enlarged prostate without lower urinary tract symptoms: Secondary | ICD-10-CM | POA: Insufficient documentation

## 2020-09-16 ENCOUNTER — Other Ambulatory Visit: Payer: Self-pay

## 2020-09-16 ENCOUNTER — Ambulatory Visit: Payer: Medicare PPO | Admitting: Physician Assistant

## 2020-09-16 VITALS — BP 150/83 | HR 73 | Ht 68.0 in | Wt 263.0 lb

## 2020-09-16 DIAGNOSIS — C61 Malignant neoplasm of prostate: Secondary | ICD-10-CM | POA: Diagnosis not present

## 2020-09-16 DIAGNOSIS — R310 Gross hematuria: Secondary | ICD-10-CM

## 2020-09-16 LAB — URINALYSIS, COMPLETE
Bilirubin, UA: NEGATIVE
Glucose, UA: NEGATIVE
Leukocytes,UA: NEGATIVE
Nitrite, UA: NEGATIVE
Specific Gravity, UA: 1.025 (ref 1.005–1.030)
Urobilinogen, Ur: 1 mg/dL (ref 0.2–1.0)
pH, UA: 5 (ref 5.0–7.5)

## 2020-09-16 LAB — MICROSCOPIC EXAMINATION: RBC, Urine: >30 /hpf — AB (ref 0–2)

## 2020-09-16 LAB — BLADDER SCAN AMB NON-IMAGING

## 2020-09-16 MED ORDER — TAMSULOSIN HCL 0.4 MG PO CAPS: 0.4000 mg | ORAL_CAPSULE | Freq: Every day | ORAL | 0 refills | Status: DC

## 2020-09-16 NOTE — Progress Notes (Signed)
09/16/2020 11:19 AM   [name] November 10, 1940 [id number]  CC: Chief Complaint  Patient presents with   Prostate Cancer   HPI: [name] is a 80 y.o. male with PMH prostate cancer, recurrent gross hematuria, urethral stricture, and BPH s/p HOLEP and cystolitholapaxy in 2019 who presents today for evaluation of gross hematuria.   Today he reports return of gross hematuria 2 weeks ago accompanied by blood dripping from the meatus requiring that he wear depends.  The gross hematuria has waxed and waned, but has been better over the past 4 to 5 days.  While he initially saw some small clots the size of his pinky fingernail, he has not seen any clots recently.  He reports feeling rather weak over this time and denies dysuria.  He additionally reports increased urinary frequency when his gross hematuria initially resumed that is progressively improving but not yet at baseline.  He states his urine flow is slow but he does feel he is emptying appropriately.  In-office UA today positive for trace ketones, 3+ blood, and 2+ leukocyte esterase; urine microscopy with >30 RBCs/HPF. PVR 54mL.  PMH: Past Medical History:  Diagnosis Date   Allergy    Anxiety    Arthritis    Cataract    beginning stage bilateral   Diverticulosis    Elevated PSA    Esophageal stricture    Esophagitis    GERD (gastroesophageal reflux disease)    Gout    History of kidney stones    Hyperlipidemia    Hypertension    Inguinal hernia    Internal hemorrhoids    Prostate cancer ([place])    Tubular adenoma of colon     Surgical History: Past Surgical History:  Procedure Laterality Date   BACK SURGERY  1980's   L4/5   BALLOON DILATION N/A 11/17/2018   Procedure: URETHRAL BALLOON DILATION;  Surgeon: Hollice Espy, MD;  Location: [facility];  Service: Urology;  Laterality: N/A;   CARPAL TUNNEL RELEASE Right 08/15/2016   Procedure: CARPAL TUNNEL RELEASE ENDOSCOPIC;  Surgeon: Corky Mull, MD;  Location: [facility];  Service: Orthopedics;  Laterality: Right;   COLONOSCOPY     CYSTOSCOPY N/A 11/17/2018   Procedure: CYSTOSCOPY;  Surgeon: Hollice Espy, MD;  Location: [facility];  Service: Urology;  Laterality: N/A;   CYSTOSCOPY WITH LITHOLAPAXY N/A 10/09/2017   Procedure: CYSTOSCOPY WITH LITHOLAPAXY;  Surgeon: Hollice Espy, MD;  Location: [facility];  Service: Urology;  Laterality: N/A;   ESOPHAGOGASTRODUODENOSCOPY  11-21-06   Esophagitis, Stricture/dilated    HOLEP-LASER ENUCLEATION OF THE PROSTATE WITH MORCELLATION N/A 10/09/2017   Procedure: HOLEP-LASER ENUCLEATION OF THE PROSTATE WITH MORCELLATION;  Surgeon: Hollice Espy, MD;  Location: [facility];  Service: Urology;  Laterality: N/A;   LYMPH NODE BIOPSY  1957   benign  lymph node removed   Right arm fracture     TONSILLECTOMY     TOTAL KNEE ARTHROPLASTY  09-02-02   Bilateral   UPPER GASTROINTESTINAL ENDOSCOPY      Home Medications:  Allergies as of 09/16/2020       Reactions   Penicillins Rash   Did it involve swelling of the face/tongue/throat, SOB, or low BP? No Did it involve sudden or severe rash/hives, skin peeling, or any reaction on the inside of your mouth or nose? No Did you need to seek medical attention at a hospital or doctor's office? No When did it last happen?      50 Years If all  above answers are "NO", may proceed with cephalosporin use.   Acetaminophen Itching   Aspirin Itching   OK w/low dose aspirin         Medication List        Accurate as of September 16, 2020 11:19 AM. If you have any questions, ask your nurse or doctor.          amLODipine 10 MG tablet Commonly known as: NORVASC TAKE 1 TABLET BY MOUTH EVERYDAY AT BEDTIME   aspirin 81 MG tablet Take 81 mg by mouth daily.   BENEFIBER DRINK MIX PO Take by mouth.   clindamycin 150 MG capsule Commonly known as: CLEOCIN Take by mouth 3 (three) times daily. Take 4 on the hour prior to dental work.   [employer] Spectravite Senior Tabs Take 1 tablet by  mouth daily.   metoprolol succinate 50 MG 24 hr tablet Commonly known as: TOPROL-XL TAKE 1 TABLET BY MOUTH EVERY DAY   Mitigare 0.6 MG Caps Generic drug: Colchicine TAKE 0.6 MG BY MOUTH DAILY AS NEEDED (GOUT FLARE).   pantoprazole 40 MG tablet Commonly known as: PROTONIX TAKE 1 TABLET BY MOUTH TWICE A DAY   tamsulosin 0.4 MG Caps capsule Commonly known as: FLOMAX Take 1 capsule (0.4 mg total) by mouth daily. Started by: Debroah Loop, PA-C   TART CHERRY ADVANCED PO Take 425 mg by mouth daily.        Allergies:  Allergies  Allergen Reactions   Penicillins Rash    Did it involve swelling of the face/tongue/throat, SOB, or low BP? No Did it involve sudden or severe rash/hives, skin peeling, or any reaction on the inside of your mouth or nose? No Did you need to seek medical attention at a hospital or doctor's office? No When did it last happen?      50 Years If all above answers are "NO", may proceed with cephalosporin use.    Acetaminophen Itching   Aspirin Itching    OK w/low dose aspirin     Family History: Family History  Problem Relation Age of Onset   Ulcers Father    Diverticulitis Father    Arthritis Father    Hypertension Brother    Obesity Brother    Cancer Brother        throat   Arthritis Mother    Depression Mother        Anxiety   Colon cancer Neg Hx    Colon polyps Neg Hx    Esophageal cancer Neg Hx    Rectal cancer Neg Hx    Stomach cancer Neg Hx    Kidney disease Neg Hx    Kidney cancer Neg Hx    Prostate cancer Neg Hx     Social History:   reports that he has never smoked. He has never used smokeless tobacco. He reports that he does not drink alcohol and does not use drugs.  Physical Exam: BP (!) 150/83   Pulse 73   Ht 5\' 8"  (1.727 m)   Wt 263 lb (119.3 kg)   BMI 39.99 kg/m   Constitutional:  Alert and oriented, no acute distress, nontoxic appearing HEENT: [place], AT Cardiovascular: No clubbing, cyanosis, or  edema Respiratory: Normal respiratory effort, no increased work of breathing Skin: No rashes, bruises or suspicious lesions Neurologic: Grossly intact, no focal deficits, moving all 4 extremities Psychiatric: Normal mood and affect  Laboratory Data: Results for orders placed or performed in visit on 09/16/20  Microscopic Examination   Urine  Result Value Ref Range   WBC, UA 0-5 0 - 5 /hpf   RBC >30 (A) 0 - 2 /hpf   Epithelial Cells (non renal) 0-10 0 - 10 /hpf   Mucus, UA Present (A) Not Estab.   Bacteria, UA Few None seen/Few  Urinalysis, Complete  Result Value Ref Range   Specific Gravity, UA 1.025 1.005 - 1.030   pH, UA 5.0 5.0 - 7.5   Color, UA Yellow Yellow   Appearance Ur Cloudy (A) Clear   Leukocytes,UA Negative Negative   Protein,UA 2+ (A) Negative/Trace   Glucose, UA Negative Negative   Ketones, UA Trace (A) Negative   RBC, UA 3+ (A) Negative   Bilirubin, UA Negative Negative   Urobilinogen, Ur 1.0 0.2 - 1.0 mg/dL   Nitrite, UA Negative Negative   Microscopic Examination See below:   Bladder Scan (Post Void Residual) in office  Result Value Ref Range   Scan Result 38mL    Assessment & Plan:   1. Gross hematuria UA today notable for microscopic hematuria but otherwise reassuring for infection.  Will send for culture to confirm.  Given his reports of weakness, will check H&H today, though he is hemodynamically stable in clinic today so I have low concern for blood loss anemia.  Additionally, we will start Flomax given reports of slow urine stream but counseled him not to start this until I get the results of his blood counts due to concerns for possible orthostasis.  In the absence of gross hematuria today and with an empty bladder on bladder scan, I am not concerned for clot retention.  We will plan for cystoscopy with Dr. Erlene Quan for further evaluation as per prior note. - Urinalysis, Complete - Bladder Scan (Post Void Residual) in office - CULTURE, URINE  COMPREHENSIVE - Hemoglobin and Hematocrit, Blood - tamsulosin (FLOMAX) 0.4 MG CAPS capsule; Take 1 capsule (0.4 mg total) by mouth daily.  Dispense: 30 capsule; Refill: 0  Return in about 2 weeks (around 09/30/2020) for Cystoscopy with Dr. Erlene Quan.  Debroah Loop, PA-C  [facility] [address] [place], [place] [address] [phone]

## 2020-09-16 NOTE — Patient Instructions (Signed)
I will call you with the results of your blood test today. Please do not pick up the Flomax I prescribed until you hear from me.  If you develop any of the following over the weekend, please go to the Emergency Department immediately: -passage of thick, ketchup-like bloody urine -passage of large blood clots (like the size of your palm) -blood dripping from the penis and the need to urinate, but the inability to do so

## 2020-09-17 LAB — HEMOGLOBIN AND HEMATOCRIT, BLOOD
Hematocrit: 43.4 % (ref 37.5–51.0)
Hemoglobin: 13.7 g/dL (ref 13.0–17.7)

## 2020-09-19 ENCOUNTER — Telehealth: Payer: Self-pay

## 2020-09-19 DIAGNOSIS — R310 Gross hematuria: Secondary | ICD-10-CM

## 2020-09-19 LAB — CULTURE, URINE COMPREHENSIVE

## 2020-09-19 MED ORDER — SULFAMETHOXAZOLE-TRIMETHOPRIM 800-160 MG PO TABS: 1.0000 | ORAL_TABLET | Freq: Two times a day (BID) | ORAL | 0 refills | Status: AC

## 2020-09-19 NOTE — Telephone Encounter (Signed)
Notified patient as advised. Confirmed pharmacy, ABX sent, patient verbalized understanding.

## 2020-09-19 NOTE — Telephone Encounter (Signed)
-----   Message from Debroah Loop, [place] sent at 09/19/2020  8:40 AM EDT ----- Blood counts stable, ok to start Flomax. He grew a tiny amount of bacteria, typically more likely to represent sample contamination than true infection, however given his gross hematuria and plans for upcoming cysto, recommend treating with Bactrim DS BID x3 days. ----- Message ----- From: Lavone Neri Lab Results In Sent: 09/16/2020   1:37 PM EDT To: Debroah Loop, PA-C

## 2020-09-28 ENCOUNTER — Other Ambulatory Visit: Payer: Self-pay

## 2020-09-28 ENCOUNTER — Ambulatory Visit: Payer: Medicare PPO | Admitting: Urology

## 2020-09-28 VITALS — BP 158/80 | HR 71 | Ht 69.0 in | Wt 260.0 lb

## 2020-09-28 DIAGNOSIS — N401 Enlarged prostate with lower urinary tract symptoms: Secondary | ICD-10-CM

## 2020-09-28 DIAGNOSIS — N138 Other obstructive and reflux uropathy: Secondary | ICD-10-CM

## 2020-09-28 DIAGNOSIS — R3129 Other microscopic hematuria: Secondary | ICD-10-CM | POA: Diagnosis not present

## 2020-09-28 DIAGNOSIS — R310 Gross hematuria: Secondary | ICD-10-CM | POA: Diagnosis not present

## 2020-09-28 DIAGNOSIS — C61 Malignant neoplasm of prostate: Secondary | ICD-10-CM

## 2020-09-28 LAB — URINALYSIS, COMPLETE
Bilirubin, UA: NEGATIVE
Glucose, UA: NEGATIVE
Leukocytes,UA: NEGATIVE
Nitrite, UA: NEGATIVE
Specific Gravity, UA: >1.03 — ABNORMAL HIGH (ref 1.005–1.030)
Urobilinogen, Ur: 0.2 mg/dL (ref 0.2–1.0)
pH, UA: 5 (ref 5.0–7.5)

## 2020-09-28 LAB — MICROSCOPIC EXAMINATION
RBC, Urine: >30 /hpf — ABNORMAL HIGH (ref 0–2)
WBC, UA: NONE SEEN /hpf (ref 0–5)

## 2020-09-28 MED ORDER — SULFAMETHOXAZOLE-TRIMETHOPRIM 800-160 MG PO TABS: 1.0000 | ORAL_TABLET | Freq: Once | ORAL | 0 refills | Status: AC

## 2020-09-28 NOTE — Progress Notes (Signed)
   09/28/20  CC:  Chief Complaint  Patient presents with   Hematuria   Cysto    HPI: 80 year old male with recurrent episodes of gross hematuria, history of BPH who returns today for office cystoscopy.  See previous notes for details.  Reports today that he is asymptomatic.  He did take 3 days of Bactrim which seem to cleared up his urinary symptoms.  He is still taking Flomax prescribed by Debroah Loop which seem to have helped his hesitancy and is not having any side effects.  There were no vitals taken for this visit. NED. A&Ox3.   No respiratory distress   Abd soft, NT, ND Normal phallus with bilateral descended testicles  Cystoscopy Procedure Note  Patient identification was confirmed, informed consent was obtained, and patient was prepped using Betadine solution.  Lidocaine jelly was administered per urethral meatus.     Pre-Procedure: - Inspection reveals a normal caliber ureteral meatus.  Procedure: The flexible cystoscope was introduced without difficulty - No urethral strictures/lesions are present. - Enlarged prostate smooth contour on the left with HoLEP defect, some prostatic regrowth primarily on the right with mild bullous edema and friable vascularity -  Open  bladder neck - Bilateral ureteral orifices identified - Bladder mucosa  reveals no ulcers, tumors, or lesions-some mild debris but visualization good - No bladder stones -Moderate trabeculation  Retroflexion shows HoLEP defect with some mild regrowth of the right lateral lobe   Post-Procedure: - Patient tolerated the procedure well  Assessment/ Plan:  1. Gross hematuria Cystoscopy today as outlined above, some mild prostatic regrowth on the right with mildly friable vessels on the side and some bullous edema otherwise completely unremarkable  Upper tract imaging less than a year ago also unremarkable - Urinalysis, Complete  2. Prostate cancer [facility]) Continue PSA/rectal exam  3.  BPH with obstruction/lower urinary tract symptoms Hesitancy improved on Flomax, discussed whether not to continue this medication ultimately he would like to continue   F/u as scheduled for IPSS/ PVR / DRE  Hollice Espy, MD

## 2020-10-04 DIAGNOSIS — M9901 Segmental and somatic dysfunction of cervical region: Secondary | ICD-10-CM | POA: Diagnosis not present

## 2020-10-04 DIAGNOSIS — M5033 Other cervical disc degeneration, cervicothoracic region: Secondary | ICD-10-CM | POA: Diagnosis not present

## 2020-10-04 DIAGNOSIS — M5136 Other intervertebral disc degeneration, lumbar region: Secondary | ICD-10-CM | POA: Diagnosis not present

## 2020-10-04 DIAGNOSIS — M9903 Segmental and somatic dysfunction of lumbar region: Secondary | ICD-10-CM | POA: Diagnosis not present

## 2020-10-08 ENCOUNTER — Other Ambulatory Visit: Payer: Self-pay | Admitting: Family Medicine

## 2020-10-08 DIAGNOSIS — I10 Essential (primary) hypertension: Secondary | ICD-10-CM

## 2020-10-12 ENCOUNTER — Other Ambulatory Visit: Payer: Self-pay | Admitting: Physician Assistant

## 2020-10-12 DIAGNOSIS — R310 Gross hematuria: Secondary | ICD-10-CM

## 2020-10-13 ENCOUNTER — Ambulatory Visit (INDEPENDENT_AMBULATORY_CARE_PROVIDER_SITE_OTHER): Payer: Medicare PPO

## 2020-10-13 VITALS — Ht 69.0 in | Wt 260.0 lb

## 2020-10-13 DIAGNOSIS — Z0000 Encounter for general adult medical examination without abnormal findings: Secondary | ICD-10-CM

## 2020-10-13 NOTE — Patient Instructions (Addendum)
[name] , Thank you for taking time to come for your Medicare Wellness Visit. I appreciate your ongoing commitment to your health goals. Please review the following plan we discussed and let me know if I can assist you in the future.   These are the goals we discussed:  Goals      Increase physical activity        This is a list of the screening recommended for you and due dates:  Health Maintenance  Topic Date Due   Flu Shot  11/29/2020*   COVID-19 Vaccine (5 - Booster for Pfizer series) 10/31/2020   Tetanus Vaccine  08/12/2024   Pneumonia vaccines  Completed   Zoster (Shingles) Vaccine  Completed   HPV Vaccine  Aged Out  *Topic was postponed. The date shown is not the original due date.    Advanced directives: not yet completed  Conditions/risks identified: none new  Follow up in one year for your annual wellness visit.   Preventive Care 8 Years and Older, Male Preventive care refers to lifestyle choices and visits with your health care provider that can promote health and wellness. What does preventive care include? A yearly physical exam. This is also called an annual well check. Dental exams once or twice a year. Routine eye exams. Ask your health care provider how often you should have your eyes checked. Personal lifestyle choices, including: Daily care of your teeth and gums. Regular physical activity. Eating a healthy diet. Avoiding tobacco and drug use. Limiting alcohol use. Practicing safe sex. Taking low doses of aspirin every day. Taking vitamin and mineral supplements as recommended by your health care provider. What happens during an annual well check? The services and screenings done by your health care provider during your annual well check will depend on your age, overall health, lifestyle risk factors, and family history of disease. Counseling  Your health care provider may ask you questions about your: Alcohol use. Tobacco use. Drug  use. Emotional well-being. Home and relationship well-being. Sexual activity. Eating habits. History of falls. Memory and ability to understand (cognition). Work and work Statistician. Screening  You may have the following tests or measurements: Height, weight, and BMI. Blood pressure. Lipid and cholesterol levels. These may be checked every 5 years, or more frequently if you are over 19 years old. Skin check. Lung cancer screening. You may have this screening every year starting at age 50 if you have a 30-pack-year history of smoking and currently smoke or have quit within the past 15 years. Fecal occult blood test (FOBT) of the stool. You may have this test every year starting at age 26. Flexible sigmoidoscopy or colonoscopy. You may have a sigmoidoscopy every 5 years or a colonoscopy every 10 years starting at age 24. Prostate cancer screening. Recommendations will vary depending on your family history and other risks. Hepatitis C blood test. Hepatitis B blood test. Sexually transmitted disease (STD) testing. Diabetes screening. This is done by checking your blood sugar (glucose) after you have not eaten for a while (fasting). You may have this done every 1-3 years. Abdominal aortic aneurysm (AAA) screening. You may need this if you are a current or former smoker. Osteoporosis. You may be screened starting at age 69 if you are at high risk. Talk with your health care provider about your test results, treatment options, and if necessary, the need for more tests. Vaccines  Your health care provider may recommend certain vaccines, such as: Influenza vaccine. This is recommended  every year. Tetanus, diphtheria, and acellular pertussis (Tdap, Td) vaccine. You may need a Td booster every 10 years. Zoster vaccine. You may need this after age 75. Pneumococcal 13-valent conjugate (PCV13) vaccine. One dose is recommended after age 9. Pneumococcal polysaccharide (PPSV23) vaccine. One dose is  recommended after age 61. Talk to your health care provider about which screenings and vaccines you need and how often you need them. This information is not intended to replace advice given to you by your health care provider. Make sure you discuss any questions you have with your health care provider. Document Released: 03/18/2015 Document Revised: 11/09/2015 Document Reviewed: 12/21/2014 Elsevier Interactive Patient Education  2017 [place] Prevention in the Home Falls can cause injuries. They can happen to people of all ages. There are many things you can do to make your home safe and to help prevent falls. What can I do on the outside of my home? Regularly fix the edges of walkways and driveways and fix any cracks. Remove anything that might make you trip as you walk through a door, such as a raised step or threshold. Trim any bushes or trees on the path to your home. Use bright outdoor lighting. Clear any walking paths of anything that might make someone trip, such as rocks or tools. Regularly check to see if handrails are loose or broken. Make sure that both sides of any steps have handrails. Any raised decks and porches should have guardrails on the edges. Have any leaves, snow, or ice cleared regularly. Use sand or salt on walking paths during winter. Clean up any spills in your garage right away. This includes oil or grease spills. What can I do in the bathroom? Use night lights. Install grab bars by the toilet and in the tub and shower. Do not use towel bars as grab bars. Use non-skid mats or decals in the tub or shower. If you need to sit down in the shower, use a plastic, non-slip stool. Keep the floor dry. Clean up any water that spills on the floor as soon as it happens. Remove soap buildup in the tub or shower regularly. Attach bath mats securely with double-sided non-slip rug tape. Do not have throw rugs and other things on the floor that can make you  trip. What can I do in the bedroom? Use night lights. Make sure that you have a light by your bed that is easy to reach. Do not use any sheets or blankets that are too big for your bed. They should not hang down onto the floor. Have a firm chair that has side arms. You can use this for support while you get dressed. Do not have throw rugs and other things on the floor that can make you trip. What can I do in the kitchen? Clean up any spills right away. Avoid walking on wet floors. Keep items that you use a lot in easy-to-reach places. If you need to reach something above you, use a strong step stool that has a grab bar. Keep electrical cords out of the way. Do not use floor polish or wax that makes floors slippery. If you must use wax, use non-skid floor wax. Do not have throw rugs and other things on the floor that can make you trip. What can I do with my stairs? Do not leave any items on the stairs. Make sure that there are handrails on both sides of the stairs and use them. Fix handrails that are broken  or loose. Make sure that handrails are as long as the stairways. Check any carpeting to make sure that it is firmly attached to the stairs. Fix any carpet that is loose or worn. Avoid having throw rugs at the top or bottom of the stairs. If you do have throw rugs, attach them to the floor with carpet tape. Make sure that you have a light switch at the top of the stairs and the bottom of the stairs. If you do not have them, ask someone to add them for you. What else can I do to help prevent falls? Wear shoes that: Do not have high heels. Have rubber bottoms. Are comfortable and fit you well. Are closed at the toe. Do not wear sandals. If you use a stepladder: Make sure that it is fully opened. Do not climb a closed stepladder. Make sure that both sides of the stepladder are locked into place. Ask someone to hold it for you, if possible. Clearly mark and make sure that you can  see: Any grab bars or handrails. First and last steps. Where the edge of each step is. Use tools that help you move around (mobility aids) if they are needed. These include: Canes. Walkers. Scooters. Crutches. Turn on the lights when you go into a dark area. Replace any light bulbs as soon as they burn out. Set up your furniture so you have a clear path. Avoid moving your furniture around. If any of your floors are uneven, fix them. If there are any pets around you, be aware of where they are. Review your medicines with your doctor. Some medicines can make you feel dizzy. This can increase your chance of falling. Ask your doctor what other things that you can do to help prevent falls. This information is not intended to replace advice given to you by your health care provider. Make sure you discuss any questions you have with your health care provider. Document Released: 12/16/2008 Document Revised: 07/28/2015 Document Reviewed: 03/26/2014 Elsevier Interactive Patient Education  2017 [employer].

## 2020-10-13 NOTE — Progress Notes (Signed)
Subjective:   [name] is a 80 y.o. male who presents for Medicare Annual/Subsequent preventive examination.  Review of Systems    No ROS.  Medicare Wellness Virtual Visit.  Visual/audio telehealth visit, UTA vital signs.   See social history for additional risk factors.   Cardiac Risk Factors include: male gender;advanced age (>42mn, >>18women)     Objective:    Today's Vitals   10/13/20 1033  Weight: 260 lb (117.9 kg)  Height: '5\' 9"'$  (1.753 m)   Body mass index is 38.4 kg/m.  Advanced Directives 10/13/2020 10/13/2019 11/13/2018 10/10/2018 10/09/2017 10/07/2017 10/04/2017  Does Patient Have a Medical Advance Directive? No No No No No No No  Would patient like information on creating a medical advance directive? No - Patient declined No - Patient declined - No - Patient declined No - Patient declined No - Patient declined No - Patient declined    Current Medications (verified) Outpatient Encounter Medications as of 10/13/2020  Medication Sig   amLODipine (NORVASC) 10 MG tablet TAKE 1 TABLET BY MOUTH EVERYDAY AT BEDTIME   aspirin 81 MG tablet Take 81 mg by mouth daily.   clindamycin (CLEOCIN) 150 MG capsule Take by mouth 3 (three) times daily. Take 4 on the hour prior to dental work.   metoprolol succinate (TOPROL-XL) 50 MG 24 hr tablet TAKE 1 TABLET BY MOUTH EVERY DAY   Misc Natural Products (TART CHERRY ADVANCED PO) Take 425 mg by mouth daily.   MITIGARE 0.6 MG CAPS TAKE 0.6 MG BY MOUTH DAILY AS NEEDED (GOUT FLARE).   Multiple Vitamins-Minerals (CVS SPECTRAVITE SENIOR) TABS Take 1 tablet by mouth daily.    pantoprazole (PROTONIX) 40 MG tablet TAKE 1 TABLET BY MOUTH TWICE A DAY   tamsulosin (FLOMAX) 0.4 MG CAPS capsule TAKE 1 CAPSULE BY MOUTH EVERY DAY   Wheat Dextrin (BENEFIBER DRINK MIX PO) Take by mouth.   Facility-Administered Encounter Medications as of 10/13/2020  Medication   0.9 %  sodium chloride infusion    Allergies (verified) Penicillins, Acetaminophen, and  Aspirin   History: Past Medical History:  Diagnosis Date   Allergy    Anxiety    Arthritis    Cataract    beginning stage bilateral   Diverticulosis    Elevated PSA    Esophageal stricture    Esophagitis    GERD (gastroesophageal reflux disease)    Gout    History of kidney stones    Hyperlipidemia    Hypertension    Inguinal hernia    Internal hemorrhoids    [place]    Tubular adenoma of colon    Past Surgical History:  Procedure Laterality Date   BACK SURGERY  1980's   L4/5   BALLOON DILATION N/A 11/17/2018   Procedure: URETHRAL BALLOON DILATION;  Surgeon: BHollice Espy MD;  Location: [facility];  Service: Urology;  Laterality: N/A;   CARPAL TUNNEL RELEASE Right 08/15/2016   Procedure: CARPAL TUNNEL RELEASE ENDOSCOPIC;  Surgeon: PCorky Mull MD;  [facility]  Service: Orthopedics;  Laterality: Right;   COLONOSCOPY     CYSTOSCOPY N/A 11/17/2018   Procedure: CYSTOSCOPY;  Surgeon: BHollice Espy MD;  Location: [facility];  Service: Urology;  Laterality: N/A;   CYSTOSCOPY WITH LITHOLAPAXY N/A 10/09/2017   Procedure: CYSTOSCOPY WITH LITHOLAPAXY;  Surgeon: BHollice Espy MD;  Location: [facility];  Service: Urology;  Laterality: N/A;   ESOPHAGOGASTRODUODENOSCOPY  11-21-06   Esophagitis, Stricture/dilated    HOLEP-LASER ENUCLEATION OF THE PROSTATE  WITH MORCELLATION N/A 10/09/2017   Procedure: HOLEP-LASER ENUCLEATION OF THE PROSTATE WITH MORCELLATION;  Surgeon: Hollice Espy, MD;  Location: [facility];  Service: Urology;  Laterality: N/A;   LYMPH NODE BIOPSY  1957   benign  lymph node removed   Right arm fracture     TONSILLECTOMY     TOTAL KNEE ARTHROPLASTY  09-02-02   Bilateral   UPPER GASTROINTESTINAL ENDOSCOPY     Family History  Problem Relation Age of Onset   Ulcers Father    Diverticulitis Father    Arthritis Father    Hypertension Brother    Obesity Brother    Cancer Brother        throat   Arthritis Mother    Depression Mother         Anxiety   Colon cancer Neg Hx    Colon polyps Neg Hx    Esophageal cancer Neg Hx    Rectal cancer Neg Hx    Stomach cancer Neg Hx    Kidney disease Neg Hx    Kidney cancer Neg Hx    Prostate cancer Neg Hx    Social History   Socioeconomic History   Marital status: Married    Spouse name: Not on file   Number of children: 1   Years of education: Not on file   Highest education level: Not on file  Occupational History   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 From [place], [place]. 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 Daughter, [name], lives in [place]. No pets.      Exercise: regular   Diet: increased sweets   Social Determinants of Health   Financial Resource Strain: Low Risk    Difficulty of Paying Living Expenses: Not very hard  Food Insecurity: No Food Insecurity   Worried About Running Out of Food in the Last Year: Never true   Ran Out of Food in the Last Year: Never true  Transportation Needs: No Transportation Needs   Lack of Transportation (Medical): No   Lack of Transportation (Non-Medical): No  Physical Activity: Sufficiently Active   Days of Exercise per Week: 5 days   Minutes of Exercise per Session: 30 min  Stress: No Stress Concern Present   Feeling of Stress : Not at all  Social Connections: Unknown   Frequency of Communication with Friends and Family: Not on file   Frequency of Social Gatherings with Friends and Family: Not on file   Attends Religious Services: Not on Electrical engineer or Organizations: Not on file   Attends Archivist Meetings: Not on file   Marital Status: Married     Tobacco Counseling Counseling given: Not Answered   Clinical Intake:  Pre-visit preparation completed: Yes        Diabetes: No  How often do you need to have someone help you when you read instructions, pamphlets, or other written materials from your doctor or pharmacy?: 1 - Never    Interpreter Needed?: No      Activities of Daily Living In your  present state of health, do you have any difficulty performing the following activities: 10/13/2020  Hearing? N  Vision? N  Difficulty concentrating or making decisions? N  Walking or climbing stairs? N  Dressing or bathing? N  Doing errands, shopping? N  Preparing Food and eating ? N  Using the Toilet? N  In the past six months, have you accidently leaked urine? Y  Comment Followed by Urology 6-12 months  Do you have problems with loss of bowel control? N  Managing your Medications? N  Managing your Finances? N  Housekeeping or managing your Housekeeping? N  Some recent data might be hidden    Patient Care Team: Leone Haven, MD as PCP - General (Family Medicine) Dasher, Rayvon Char, MD (Dermatology) Birder Robson, MD as Referring Physician (Ophthalmology) Gaynelle Arabian, MD (Orthopedic Surgery)  Indicate any recent Medical Services you may have received from other than [facility] providers in the past year (date may be approximate).     Assessment:   This is a routine wellness examination for [facility].  I connected with Taten today by telephone and verified that I am speaking with the correct person using two identifiers. Location patient: home Location provider: work Persons participating in the virtual visit: patient, Marine scientist.    I discussed the limitations, risks, security and privacy concerns of performing an evaluation and management service by telephone and the availability of in person appointments. I also discussed with the patient that there may be a patient responsible charge related to this service. The  patient expressed understanding and verbally consented to this telephonic visit.    Interactive audio and video telecommunications were attempted between this provider and patient, however failed, due to patient having technical difficulties OR patient did not have access to video capability.  We continued and completed visit with audio only.  Some vital signs may be absent or patient reported.   Hearing/Vision screen Hearing Screening - Comments:: Patient is able to hear conversational tones without difficulty.  No issues reported.  Vision Screening - Comments:: Wears corrective lenses They have seen their ophthalmologist in the last 12 months.  Followed by [facility], Dr. George Ina  Dietary issues and exercise activities discussed: Current Exercise Habits: Home exercise routine, Type of exercise: walking, Intensity: Mild   Goals Addressed             This Visit's Progress    Increase physical activity         Depression Screen PHQ 2/9 Scores 10/13/2020 08/16/2020 02/04/2020 10/13/2019 08/14/2019 02/11/2019 10/10/2018  PHQ - 2 Score 0 0 0 0 0 0 0  PHQ- 9 Score - - - - - - -    Fall Risk Fall Risk  10/13/2020 08/16/2020 02/04/2020 10/13/2019 08/14/2019  Falls in the past year? 0 0 0 0 0  Number falls in past yr: - 0 0 0 0  Follow up Falls evaluation completed Falls evaluation completed Falls evaluation completed Falls evaluation completed Falls evaluation completed    [place]: Adequate lighting in your home to reduce risk of falls? Yes   ASSISTIVE DEVICES UTILIZED TO PREVENT FALLS: Use of a cane, Holzhauer or w/c? No   TIMED UP AND GO: Was the test performed? No .   Cognitive Function: Patient is alert and oriented x3.  Denies difficulty making decisions, memory loss.  Enjoys brain health games/activity.  MMSE/6CIT deferred. Normal by direct communication/observation.  MMSE - Mini Mental State Exam 10/13/2019 10/07/2017 10/15/2016 10/12/2015  Not completed: Unable to complete - - -  Orientation to time - '5 5 5  '$ Orientation to Place - '5 5 5  '$ Registration - '3 3 3  '$ Attention/ Calculation - '5 5 5  '$ Recall - '3 3 3  '$ Language- name 2 objects - '2 2 2  '$ Language- repeat - '1 1 1  '$ Language- follow 3 step command - '3 3 3  '$ Language- read & follow direction - '1 1 1  '$ Write a sentence - '1 1 1  '$ Copy design - '1 1 1  '$ Total score - '30 30 30     '$ 6CIT Screen 10/10/2018  What Year? 0 points  What month? 0 points  What time? 0 points  Count back from 20 0 points  Months in reverse 0 points  Repeat phrase 0 points  Total Score 0    Immunizations Immunization History  Administered Date(s) Administered   Fluad Quad(high Dose 65+) 11/26/2018, 01/09/2019   Influenza Whole 01/03/2006, 12/24/2011   Influenza, High Dose Seasonal PF 10/20/2016, 11/03/2017, 12/03/2017, 11/26/2019   Influenza,inj,Quad PF,6+ Mos 12/15/2012, 10/28/2014   Influenza-Unspecified 12/25/2013, 11/08/2015, 10/20/2016   PFIZER(Purple Top)SARS-COV-2 Vaccination 03/26/2019, 04/16/2019, 12/07/2019, 07/01/2020   Pneumococcal Conjugate-13 06/30/2013   Pneumococcal Polysaccharide-23 06/21/2006   Td 08/19/2002   Tdap 08/13/2014   Zoster Recombinat (Shingrix) 10/20/2018, 01/09/2019   Zoster, Live 12/28/2009   Health Maintenance There are no preventive care reminders to display for this patient. Health Maintenance  Topic Date Due   INFLUENZA VACCINE  11/29/2020 (Originally 10/03/2020)   COVID-19 Vaccine (5 - Booster for Pfizer series) 10/31/2020   TETANUS/TDAP  08/12/2024   PNA vac Low Risk Adult  Completed   Zoster Vaccines- Shingrix  Completed   HPV VACCINES  Aged Out   Lung Cancer Screening: (Low Dose CT Chest recommended if Age 34-80 years, 30 pack-year currently smoking OR have quit w/in 15years.) does not qualify.   Hepatitis C Screening: does not qualify.  Vision Screening: Recommended annual ophthalmology exams for early detection of glaucoma and other disorders of  the eye.  Dental Screening: Recommended annual dental exams for proper oral hygiene  Community Resource Referral / Chronic Care Management: CRR required this visit?  No   CCM required this visit?  No      Plan:    I have personally reviewed and noted the following in the patient's chart:   Medical and social history Use of alcohol, tobacco or illicit drugs  Current medications and supplements including opioid prescriptions. Patient is not currently taking opioid prescriptions. Functional ability and status Nutritional status Physical activity Advanced directives List of other physicians Hospitalizations, surgeries, and ER visits in previous 12 months Vitals Screenings to include cognitive, depression, and falls Referrals and appointments  In addition, I have reviewed and discussed with patient certain preventive protocols, quality metrics, and best practice recommendations. A written personalized care plan for preventive services as well as general preventive health recommendations were provided to patient via mychart.     Varney Biles, LPN   [phone]

## 2020-11-15 DIAGNOSIS — M9903 Segmental and somatic dysfunction of lumbar region: Secondary | ICD-10-CM | POA: Diagnosis not present

## 2020-11-15 DIAGNOSIS — M5033 Other cervical disc degeneration, cervicothoracic region: Secondary | ICD-10-CM | POA: Diagnosis not present

## 2020-11-15 DIAGNOSIS — M5136 Other intervertebral disc degeneration, lumbar region: Secondary | ICD-10-CM | POA: Diagnosis not present

## 2020-11-15 DIAGNOSIS — M9901 Segmental and somatic dysfunction of cervical region: Secondary | ICD-10-CM | POA: Diagnosis not present

## 2020-11-23 ENCOUNTER — Ambulatory Visit (INDEPENDENT_AMBULATORY_CARE_PROVIDER_SITE_OTHER): Payer: Medicare PPO

## 2020-11-23 ENCOUNTER — Other Ambulatory Visit: Payer: Self-pay

## 2020-11-23 DIAGNOSIS — Z23 Encounter for immunization: Secondary | ICD-10-CM | POA: Diagnosis not present

## 2020-12-13 DIAGNOSIS — M5033 Other cervical disc degeneration, cervicothoracic region: Secondary | ICD-10-CM | POA: Diagnosis not present

## 2020-12-13 DIAGNOSIS — M9903 Segmental and somatic dysfunction of lumbar region: Secondary | ICD-10-CM | POA: Diagnosis not present

## 2020-12-13 DIAGNOSIS — M9901 Segmental and somatic dysfunction of cervical region: Secondary | ICD-10-CM | POA: Diagnosis not present

## 2020-12-13 DIAGNOSIS — M5136 Other intervertebral disc degeneration, lumbar region: Secondary | ICD-10-CM | POA: Diagnosis not present

## 2020-12-19 ENCOUNTER — Telehealth: Payer: Self-pay

## 2020-12-19 MED ORDER — MOLNUPIRAVIR EUA 200MG CAPSULE: 4.0000 | ORAL_CAPSULE | Freq: Two times a day (BID) | ORAL | 0 refills | Status: AC

## 2020-12-19 NOTE — Telephone Encounter (Signed)
I called and spoke with the patient and informed him of the instructions form the provider for the covid and I let him know he sent in the medication and they are to start it right away and he understood.  Katlin Bortner,cma

## 2020-12-19 NOTE — Telephone Encounter (Signed)
I sent a medicine in called molnupiravir.  He needs to start this as soon as possible.  If there is no drive-through at the pharmacy they need to have somebody else pick up the medication and drop it at their door.  Otherwise they need to go through the drive-through.  Potential side effects include diarrhea, nausea, and dizziness.  If those occur they need to let us know.  There is a small chance that he could have a rebound infection after coming off of this medication.  He may start to feel better quickly after starting this medicine though the purpose is to reduce the risk of death and ending up in the hospital.  If he develops chest pain, shortness of breath, fever of greater than 103 F, or any worsening symptoms he needs to go be evaluated in person.  He needs to remain well-hydrated.  If he starts to feel dehydrated he needs to be evaluated in person.  If he or his wife are not improving in the next 4 to 5 days and need to let us know.

## 2020-12-26 ENCOUNTER — Encounter: Payer: Self-pay | Admitting: Family Medicine

## 2020-12-26 ENCOUNTER — Encounter: Payer: Self-pay | Admitting: Urology

## 2020-12-29 DIAGNOSIS — D2261 Melanocytic nevi of right upper limb, including shoulder: Secondary | ICD-10-CM | POA: Diagnosis not present

## 2020-12-29 DIAGNOSIS — D225 Melanocytic nevi of trunk: Secondary | ICD-10-CM | POA: Diagnosis not present

## 2020-12-29 DIAGNOSIS — L820 Inflamed seborrheic keratosis: Secondary | ICD-10-CM | POA: Diagnosis not present

## 2020-12-29 DIAGNOSIS — R208 Other disturbances of skin sensation: Secondary | ICD-10-CM | POA: Diagnosis not present

## 2020-12-29 DIAGNOSIS — D485 Neoplasm of uncertain behavior of skin: Secondary | ICD-10-CM | POA: Diagnosis not present

## 2020-12-29 DIAGNOSIS — R58 Hemorrhage, not elsewhere classified: Secondary | ICD-10-CM | POA: Diagnosis not present

## 2020-12-29 DIAGNOSIS — D2271 Melanocytic nevi of right lower limb, including hip: Secondary | ICD-10-CM | POA: Diagnosis not present

## 2020-12-29 DIAGNOSIS — C44319 Basal cell carcinoma of skin of other parts of face: Secondary | ICD-10-CM | POA: Diagnosis not present

## 2020-12-29 DIAGNOSIS — L570 Actinic keratosis: Secondary | ICD-10-CM | POA: Diagnosis not present

## 2020-12-29 DIAGNOSIS — Z85828 Personal history of other malignant neoplasm of skin: Secondary | ICD-10-CM | POA: Diagnosis not present

## 2021-01-10 DIAGNOSIS — M5136 Other intervertebral disc degeneration, lumbar region: Secondary | ICD-10-CM | POA: Diagnosis not present

## 2021-01-10 DIAGNOSIS — M9901 Segmental and somatic dysfunction of cervical region: Secondary | ICD-10-CM | POA: Diagnosis not present

## 2021-01-10 DIAGNOSIS — M9903 Segmental and somatic dysfunction of lumbar region: Secondary | ICD-10-CM | POA: Diagnosis not present

## 2021-01-10 DIAGNOSIS — M5033 Other cervical disc degeneration, cervicothoracic region: Secondary | ICD-10-CM | POA: Diagnosis not present

## 2021-01-25 DIAGNOSIS — H3589 Other specified retinal disorders: Secondary | ICD-10-CM | POA: Diagnosis not present

## 2021-01-25 DIAGNOSIS — Z0100 Encounter for examination of eyes and vision without abnormal findings: Secondary | ICD-10-CM | POA: Diagnosis not present

## 2021-01-30 DIAGNOSIS — I10 Essential (primary) hypertension: Secondary | ICD-10-CM | POA: Diagnosis not present

## 2021-01-30 DIAGNOSIS — N1831 Chronic kidney disease, stage 3a: Secondary | ICD-10-CM | POA: Diagnosis not present

## 2021-01-30 DIAGNOSIS — M1000 Idiopathic gout, unspecified site: Secondary | ICD-10-CM | POA: Diagnosis not present

## 2021-01-30 DIAGNOSIS — N200 Calculus of kidney: Secondary | ICD-10-CM | POA: Diagnosis not present

## 2021-01-30 DIAGNOSIS — N400 Enlarged prostate without lower urinary tract symptoms: Secondary | ICD-10-CM | POA: Diagnosis not present

## 2021-01-30 DIAGNOSIS — R809 Proteinuria, unspecified: Secondary | ICD-10-CM | POA: Diagnosis not present

## 2021-02-07 DIAGNOSIS — M9903 Segmental and somatic dysfunction of lumbar region: Secondary | ICD-10-CM | POA: Diagnosis not present

## 2021-02-07 DIAGNOSIS — M5136 Other intervertebral disc degeneration, lumbar region: Secondary | ICD-10-CM | POA: Diagnosis not present

## 2021-02-07 DIAGNOSIS — M9901 Segmental and somatic dysfunction of cervical region: Secondary | ICD-10-CM | POA: Diagnosis not present

## 2021-02-07 DIAGNOSIS — M5033 Other cervical disc degeneration, cervicothoracic region: Secondary | ICD-10-CM | POA: Diagnosis not present

## 2021-02-09 DIAGNOSIS — N400 Enlarged prostate without lower urinary tract symptoms: Secondary | ICD-10-CM | POA: Diagnosis not present

## 2021-02-09 DIAGNOSIS — N200 Calculus of kidney: Secondary | ICD-10-CM | POA: Diagnosis not present

## 2021-02-09 DIAGNOSIS — N1831 Chronic kidney disease, stage 3a: Secondary | ICD-10-CM | POA: Diagnosis not present

## 2021-02-09 DIAGNOSIS — I10 Essential (primary) hypertension: Secondary | ICD-10-CM | POA: Diagnosis not present

## 2021-02-09 DIAGNOSIS — M1000 Idiopathic gout, unspecified site: Secondary | ICD-10-CM | POA: Diagnosis not present

## 2021-02-09 DIAGNOSIS — R809 Proteinuria, unspecified: Secondary | ICD-10-CM | POA: Diagnosis not present

## 2021-02-16 DIAGNOSIS — C44319 Basal cell carcinoma of skin of other parts of face: Secondary | ICD-10-CM | POA: Diagnosis not present

## 2021-02-17 ENCOUNTER — Encounter: Payer: Self-pay | Admitting: Family Medicine

## 2021-02-17 ENCOUNTER — Other Ambulatory Visit: Payer: Self-pay

## 2021-02-17 ENCOUNTER — Ambulatory Visit: Payer: Medicare PPO | Admitting: Family Medicine

## 2021-02-17 DIAGNOSIS — C4491 Basal cell carcinoma of skin, unspecified: Secondary | ICD-10-CM | POA: Insufficient documentation

## 2021-02-17 DIAGNOSIS — K625 Hemorrhage of anus and rectum: Secondary | ICD-10-CM

## 2021-02-17 DIAGNOSIS — I10 Essential (primary) hypertension: Secondary | ICD-10-CM

## 2021-02-17 DIAGNOSIS — R600 Localized edema: Secondary | ICD-10-CM

## 2021-02-17 DIAGNOSIS — E785 Hyperlipidemia, unspecified: Secondary | ICD-10-CM | POA: Diagnosis not present

## 2021-02-17 DIAGNOSIS — C61 Malignant neoplasm of prostate: Secondary | ICD-10-CM

## 2021-02-17 DIAGNOSIS — K5900 Constipation, unspecified: Secondary | ICD-10-CM

## 2021-02-17 DIAGNOSIS — Z8616 Personal history of COVID-19: Secondary | ICD-10-CM | POA: Diagnosis not present

## 2021-02-17 DIAGNOSIS — C44319 Basal cell carcinoma of skin of other parts of face: Secondary | ICD-10-CM

## 2021-02-17 LAB — HEPATIC FUNCTION PANEL
ALT: 31 U/L (ref 0–53)
AST: 23 U/L (ref 0–37)
Albumin: 4.2 g/dL (ref 3.5–5.2)
Alkaline Phosphatase: 84 U/L (ref 39–117)
Bilirubin, Direct: 0.1 mg/dL (ref 0.0–0.3)
Total Bilirubin: 0.7 mg/dL (ref 0.2–1.2)
Total Protein: 7.8 g/dL (ref 6.0–8.3)

## 2021-02-17 LAB — CBC
HCT: 42.5 % (ref 39.0–52.0)
Hemoglobin: 14.3 g/dL (ref 13.0–17.0)
MCHC: 33.6 g/dL (ref 30.0–36.0)
MCV: 89.7 fl (ref 78.0–100.0)
Platelets: 214 10*3/uL (ref 150.0–400.0)
RBC: 4.74 Mil/uL (ref 4.22–5.81)
RDW: 14 % (ref 11.5–15.5)
WBC: 7.9 10*3/uL (ref 4.0–10.5)

## 2021-02-17 LAB — TSH: TSH: 3.86 u[IU]/mL (ref 0.35–5.50)

## 2021-02-17 NOTE — Assessment & Plan Note (Signed)
Likely related to amlodipine though we will obtain lab work to evaluate for other causes.

## 2021-02-17 NOTE — Assessment & Plan Note (Signed)
He will follow-up with urology as planned.

## 2021-02-17 NOTE — Patient Instructions (Addendum)
Nice to see you. We will get lab work today. Please check your blood pressure as we discussed. Somebody from Dr. Vena Rua office should contact you to set up a visit. Please remember to bring your blood pressure cuff to your next visit.

## 2021-02-17 NOTE — Assessment & Plan Note (Signed)
He will continue to see dermatology. 

## 2021-02-17 NOTE — Assessment & Plan Note (Signed)
Discussed that given his age and lack of history of stroke and heart attack he does not require a statin regardless of what his cholesterol is at this time.  We will defer any further cholesterol evaluations.

## 2021-02-17 NOTE — Assessment & Plan Note (Signed)
Well-controlled.  He reports he is checking his blood pressure at home around his elbow and thus we will have him check it around his upper arm.  He will continue amlodipine 10 mg once daily and metoprolol 50 mg daily.

## 2021-02-17 NOTE — Assessment & Plan Note (Signed)
The patient has had rectal bleeding recently in the setting of significant constipation.  It is certainly possible he has had a rectal fissure or hemorrhoids that could be contributing.  His current bleeding is likely hemorrhoidal in nature though given recurrence we will have him see GI to consider the need for repeat colonoscopy versus hemorrhoidal banding.  He was encouraged to add in a fiber supplement to help with his stools.  If he has excessive bleeding he will be evaluated immediately.  Check CBC today.

## 2021-02-17 NOTE — Assessment & Plan Note (Signed)
The patient has fully recovered.

## 2021-02-17 NOTE — Progress Notes (Signed)
Tommi Rumps, MD Phone: [phone]  [name] is a 80 y.o. male who presents today for f/u.  HYPERTENSION Disease Monitoring Home BP Monitoring 630Z systolic Chest pain- no    Dyspnea- no Medications Compliance-  taking amlodipine, metoprolol.  Edema- chronic generally stable, no orthopnea or PND BMET    Component Value Date/Time   NA 138 02/04/2020 1238   NA 137 09/01/2013 0410   K 4.5 02/04/2020 1238   K 3.6 09/01/2013 0410   CL 102 02/04/2020 1238   CL 106 09/01/2013 0410   CO2 30 02/04/2020 1238   CO2 23 09/01/2013 0410   GLUCOSE 92 02/04/2020 1238   GLUCOSE 107 (H) 09/01/2013 0410   BUN 20 02/04/2020 1238   BUN 13 06/24/2017 1039   BUN 14 09/01/2013 0410   CREATININE 1.42 02/04/2020 1238   CREATININE 1.40 (H) 09/01/2013 0410   CALCIUM 9.2 02/04/2020 1238   CALCIUM 8.0 (L) 09/01/2013 0410   GFRNONAA 50 (L) 10/04/2017 1306   GFRNONAA 49 (L) 09/01/2013 0410   GFRAA 57 (L) 10/04/2017 1306   GFRAA 57 (L) 09/01/2013 0410   Constipation: Patient notes a month or so ago he got significantly constipated.  He was having lower abdominal cramping with this.  He was taking laxatives and having bowel movements every 3 days.  They were significantly hard stools.  He stopped the tamsulosin and increased his dietary fiber and that seems to have helped.  He is having bowel movements daily.  They are still firmer than normal.  He has had some blood in his stool that has somewhat improved since his bowel movements have improved.  He still has some blood when wiping.  The patient had a colonoscopy in 2019 that revealed a single polyp as well as diverticuli and hemorrhoids.  Basal cell carcinoma: Patient reports he had a basal cell carcinoma removed from his right forehead yesterday.  History of COVID-19: He is fully recovered.  Prostate cancer: Patient continues to see urology.  They are monitoring every 6 months.  He sees them next week.  He does have periodic hematuria that may  be related to stones or friable prostate tissue.  Social History   Tobacco Use  Smoking Status Never  Smokeless Tobacco Never    Current Outpatient Medications on File Prior to Visit  Medication Sig Dispense Refill   amLODipine (NORVASC) 10 MG tablet TAKE 1 TABLET BY MOUTH EVERYDAY AT BEDTIME 90 tablet 1   aspirin 81 MG tablet Take 81 mg by mouth daily.     clindamycin (CLEOCIN) 150 MG capsule Take by mouth 3 (three) times daily. Take 4 on the hour prior to dental work.     metoprolol succinate (TOPROL-XL) 50 MG 24 hr tablet TAKE 1 TABLET BY MOUTH EVERY DAY 90 tablet 1   Misc Natural Products (TART CHERRY ADVANCED PO) Take 425 mg by mouth daily.     MITIGARE 0.6 MG CAPS TAKE 0.6 MG BY MOUTH DAILY AS NEEDED (GOUT FLARE). 90 capsule 1   Multiple Vitamins-Minerals (CVS SPECTRAVITE SENIOR) TABS Take 1 tablet by mouth daily.      pantoprazole (PROTONIX) 40 MG tablet TAKE 1 TABLET BY MOUTH TWICE A DAY 180 tablet 3   tamsulosin (FLOMAX) 0.4 MG CAPS capsule TAKE 1 CAPSULE BY MOUTH EVERY DAY 30 capsule 11   Wheat Dextrin (BENEFIBER DRINK MIX PO) Take by mouth.     Current Facility-Administered Medications on File Prior to Visit  Medication Dose Route Frequency Provider Last Rate  Last Admin   0.9 %  sodium chloride infusion  500 mL Intravenous Once Armbruster, Carlota Raspberry, MD         ROS see history of present illness  Objective  Physical Exam Vitals:   02/17/21 0809  BP: 120/70  Pulse: 63  Temp: 98.5 F (36.9 C)  SpO2: 98%    BP Readings from Last 3 Encounters:  02/17/21 120/70  09/28/20 (!) 158/80  09/16/20 (!) 150/83   Wt Readings from Last 3 Encounters:  02/17/21 255 lb 12.8 oz (116 kg)  10/13/20 260 lb (117.9 kg)  09/28/20 260 lb (117.9 kg)    Physical Exam Constitutional:      General: He is not in acute distress.    Appearance: He is not diaphoretic.  Cardiovascular:     Rate and Rhythm: Normal rate and regular rhythm.     Heart sounds: Normal heart sounds.   Pulmonary:     Effort: Pulmonary effort is normal.     Breath sounds: Normal breath sounds.  Abdominal:     General: Bowel sounds are normal. There is no distension.     Palpations: Abdomen is soft.     Tenderness: There is no abdominal tenderness.  Musculoskeletal:     Comments: 1+ pitting edema bilateral lower extremities  Skin:    General: Skin is warm and dry.  Neurological:     Mental Status: He is alert.     Assessment/Plan: Please see individual problem list.  Problem List Items Addressed This Visit     Basal cell carcinoma    He will continue to see dermatology.      Bilateral lower extremity edema    Likely related to amlodipine though we will obtain lab work to evaluate for other causes.      Relevant Orders   CBC   TSH   Hepatic function panel   Constipation    He will continue with increased dietary fiber.  He will add a fiber supplement as well.  He will see GI as discussed above.      Essential hypertension    Well-controlled.  He reports he is checking his blood pressure at home around his elbow and thus we will have him check it around his upper arm.  He will continue amlodipine 10 mg once daily and metoprolol 50 mg daily.      History of COVID-19    The patient has fully recovered.      Hyperlipidemia    Discussed that given his age and lack of history of stroke and heart attack he does not require a statin regardless of what his cholesterol is at this time.  We will defer any further cholesterol evaluations.      Prostate cancer [facility])    He will follow-up with urology as planned.      Rectal bleeding    The patient has had rectal bleeding recently in the setting of significant constipation.  It is certainly possible he has had a rectal fissure or hemorrhoids that could be contributing.  His current bleeding is likely hemorrhoidal in nature though given recurrence we will have him see GI to consider the need for repeat colonoscopy versus  hemorrhoidal banding.  He was encouraged to add in a fiber supplement to help with his stools.  If he has excessive bleeding he will be evaluated immediately.  Check CBC today.      Relevant Orders   CBC   Ambulatory referral to Gastroenterology  Return in about 6 months (around 08/18/2021).  This visit occurred during the SARS-CoV-2 public health emergency.  Safety protocols were in place, including screening questions prior to the visit, additional usage of staff PPE, and extensive cleaning of exam room while observing appropriate contact time as indicated for disinfecting solutions.    Tommi Rumps, MD [place]

## 2021-02-17 NOTE — Assessment & Plan Note (Signed)
He will continue with increased dietary fiber.  He will add a fiber supplement as well.  He will see GI as discussed above.

## 2021-02-20 ENCOUNTER — Other Ambulatory Visit: Payer: Medicare PPO

## 2021-02-20 ENCOUNTER — Other Ambulatory Visit: Payer: Self-pay

## 2021-02-20 DIAGNOSIS — C61 Malignant neoplasm of prostate: Secondary | ICD-10-CM | POA: Diagnosis not present

## 2021-02-21 LAB — PSA: Prostate Specific Ag, Serum: 13.1 ng/mL — ABNORMAL HIGH (ref 0.0–4.0)

## 2021-02-21 NOTE — Progress Notes (Signed)
02/22/21 10:39 AM   [name] 10-Nov-1940 [id number]  Referring provider:  Leone Haven, MD [address] [place],  [place] [address] Chief Complaint  Patient presents with   Prostate Cancer     HPI: [name] is a 80 y.o.male prostate cancer, cancer, recurrent gross hematuria, urethral stricture, and BPH, who presents today for 6 month follow-up with IPSS, PVR, and PSA prior.   He is s/p HoLEP and cystolitholapaxy in 2019.  He was doing well but ultimately developed recurrent gross hematuria secondary to dystrophic calcifications and was also found to have a bulbar urethral stricture.  He returned to the operating room on 11/17/2018 for dilation of bulbar urethral stricture.  Foley was left in place for 3 days.  He had a negative prostate biopsy in 2017.  Preop prostate volume 120 cc.   New baseline PSA is 3.4 ng/mL from 01/17/18 following surgery.   Since then, his PSA has been fluctuating.  His most recent PSA today is pending.     He underwent prostate MRI on 08/19/2018 which showed a nodular transition zone consistent with BPH, PI-RADS 1 and some incidental asymmetry of the seminal vesicle, otherwise no evidence of high-grade lesions.  Newly calculated prostatic volume 56 cc.   He had a prostate bx on 08/12/19. His path report indicates 2 of 12 cores involved low volume Gleason 3+3 up to 6% of tissue primarily at right mid and right apex. Previous cysto revealed regrowth of right lobe only which was also appreciated on TRUS. TRUS 70 g  His most recent PSA on 02/20/2021 was 13.1.   He reports that he stopped taking Flomax due to constipation. His constipation has improved off of Flomax. He reports he is doing well on a urinary standpoint off of Flomax. He reports that he has to urinate more during the nighttime, he reports that he snores,  he has not had a sleep study and is not sure whether he needs a CPAP.    PSA trend:  Component Prostate Specific Ag, Serum   Latest Ref Rng & Units 0.0 - 4.0 ng/mL  06/24/2017 6.1 (H)  01/17/2018 3.4  07/17/2018 4.6 (H)  01/27/2019 6.4 (H)  07/21/2019 9.0 (H)  08/18/2020 9.6 (H)  02/20/2021 13.1 (H)    IPSS     Row Name 02/22/21 1000         International Prostate Symptom Score   How often have you had the sensation of not emptying your bladder? Less than half the time     How often have you had to urinate less than every two hours? About half the time     How often have you found you stopped and started again several times when you urinated? About half the time     How often have you found it difficult to postpone urination? Less than half the time     How often have you had a weak urinary stream? Less than 1 in 5 times     How often have you had to strain to start urination? Not at All     How many times did you typically get up at night to urinate? 3 Times     Total IPSS Score 14       Quality of Life due to urinary symptoms   If you were to spend the rest of your life with your urinary condition just the way it is now how would you feel about that?  Mostly Satisfied              Score:  1-7 Mild 8-19 Moderate 20-35 Severe  PMH: Past Medical History:  Diagnosis Date   Allergy    Anxiety    Arthritis    Cataract    beginning stage bilateral   Diverticulosis    Elevated PSA    Esophageal stricture    Esophagitis    GERD (gastroesophageal reflux disease)    Gout    History of kidney stones    Hyperlipidemia    Hypertension    Inguinal hernia    Internal hemorrhoids    Prostate cancer ([place])    Tubular adenoma of colon     Surgical History: Past Surgical History:  Procedure Laterality Date   BACK SURGERY  1980's   L4/5   BALLOON DILATION N/A 11/17/2018   Procedure: URETHRAL BALLOON DILATION;  Surgeon: Hollice Espy, MD;  Location: [facility];  Service: Urology;  Laterality: N/A;   CARPAL TUNNEL RELEASE Right 08/15/2016   Procedure: CARPAL TUNNEL RELEASE ENDOSCOPIC;  Surgeon:  Corky Mull, MD;  Location: [facility];  Service: Orthopedics;  Laterality: Right;   COLONOSCOPY     CYSTOSCOPY N/A 11/17/2018   Procedure: CYSTOSCOPY;  Surgeon: Hollice Espy, MD;  Location: [facility];  Service: Urology;  Laterality: N/A;   CYSTOSCOPY WITH LITHOLAPAXY N/A 10/09/2017   Procedure: CYSTOSCOPY WITH LITHOLAPAXY;  Surgeon: Hollice Espy, MD;  Location: [facility];  Service: Urology;  Laterality: N/A;   ESOPHAGOGASTRODUODENOSCOPY  11-21-06   Esophagitis, Stricture/dilated    HOLEP-LASER ENUCLEATION OF THE PROSTATE WITH MORCELLATION N/A 10/09/2017   Procedure: HOLEP-LASER ENUCLEATION OF THE PROSTATE WITH MORCELLATION;  Surgeon: Hollice Espy, MD;  Location: [facility];  Service: Urology;  Laterality: N/A;   LYMPH NODE BIOPSY  1957   benign  lymph node removed   Right arm fracture     TONSILLECTOMY     TOTAL KNEE ARTHROPLASTY  09-02-02   Bilateral   UPPER GASTROINTESTINAL ENDOSCOPY      Home Medications:  Allergies as of 02/22/2021       Reactions   Penicillins Rash   Did it involve swelling of the face/tongue/throat, SOB, or low BP? No Did it involve sudden or severe rash/hives, skin peeling, or any reaction on the inside of your mouth or nose? No Did you need to seek medical attention at a hospital or doctor's office? No When did it last happen?      50 Years If all above answers are NO, may proceed with cephalosporin use.   Acetaminophen Itching   Aspirin Itching   OK w/low dose aspirin         Medication List        Accurate as of February 22, 2021 10:39 AM. If you have any questions, ask your nurse or doctor.          STOP taking these medications    tamsulosin 0.4 MG Caps capsule Commonly known as: FLOMAX Stopped by: Hollice Espy, MD       TAKE these medications    amLODipine 10 MG tablet Commonly known as: NORVASC TAKE 1 TABLET BY MOUTH EVERYDAY AT BEDTIME   aspirin 81 MG tablet Take 81 mg by mouth daily.   BENEFIBER DRINK MIX  PO Take by mouth.   clindamycin 150 MG capsule Commonly known as: CLEOCIN Take by mouth 3 (three) times daily. Take 4 on the hour prior to dental work.   [employer] Senior Tabs Take  1 tablet by mouth daily.   metoprolol succinate 50 MG 24 hr tablet Commonly known as: TOPROL-XL TAKE 1 TABLET BY MOUTH EVERY DAY   Mitigare 0.6 MG Caps Generic drug: Colchicine TAKE 0.6 MG BY MOUTH DAILY AS NEEDED (GOUT FLARE).   pantoprazole 40 MG tablet Commonly known as: PROTONIX TAKE 1 TABLET BY MOUTH TWICE A DAY   TART CHERRY ADVANCED PO Take 425 mg by mouth daily.        Allergies:  Allergies  Allergen Reactions   Penicillins Rash    Did it involve swelling of the face/tongue/throat, SOB, or low BP? No Did it involve sudden or severe rash/hives, skin peeling, or any reaction on the inside of your mouth or nose? No Did you need to seek medical attention at a hospital or doctor's office? No When did it last happen?      50 Years If all above answers are NO, may proceed with cephalosporin use.    Acetaminophen Itching   Aspirin Itching    OK w/low dose aspirin     Family History: Family History  Problem Relation Age of Onset   Ulcers Father    Diverticulitis Father    Arthritis Father    Hypertension Brother    Obesity Brother    Cancer Brother        throat   Arthritis Mother    Depression Mother        Anxiety   Colon cancer Neg Hx    Colon polyps Neg Hx    Esophageal cancer Neg Hx    Rectal cancer Neg Hx    Stomach cancer Neg Hx    Kidney disease Neg Hx    Kidney cancer Neg Hx    Prostate cancer Neg Hx     Social History:  reports that he has never smoked. He has never used smokeless tobacco. He reports that he does not drink alcohol and does not use drugs.   Physical Exam: BP (!) 183/79    Pulse 65    Ht 5\' 8"  (1.727 m)    Wt 255 lb (115.7 kg)    BMI 38.77 kg/m   Constitutional:  Alert and oriented, No acute distress. HEENT: [place] AT, moist mucus  membranes.  Trachea midline, no masses. Cardiovascular: No clubbing, cyanosis, or edema. Respiratory: Normal respiratory effort, no increased work of breathing. Skin: No rashes, bruises or suspicious lesions. Neurologic: Grossly intact, no focal deficits, moving all 4 extremities. Psychiatric: Normal mood and affect.  Laboratory Data: Lab Results  Component Value Date   CREATININE 1.42 02/04/2020   Lab Results  Component Value Date   PSA 6.41 (H) 02/04/2020   PSA 5.45 (H) 07/20/2015   PSA 6.58 (H) 07/16/2014   Lab Results  Component Value Date   HGBA1C 5.6 06/26/2006    Pertinent Imaging: Results for orders placed or performed in visit on 02/22/21  Bladder Scan (Post Void Residual) in office  Result Value Ref Range   Scan Result 20    Assessment & Plan:   Prostate cancer/ rising PSA - Low risk for cancer on active surveillance with upper trending PSA - PSA is rising, has had multiple studies including repeat biopsy as well as MRI that showed no progression of disease - Recommend repeating prostate MRI to ensure stability, will call with results  2. BPH with obstruction/lower urinary tract symptoms including nocturia - S/p Holep - He is emptying adequately today  - He stopped his Flomax due to constipation  -  Urinary symptoms have improved off of Flomax we discussed if he would like to continue this medication he would not like to continue.  - He was interested in outlet procedures today primarily Urolift, we discussed this procedure in detail.  He is not an ideal candidate for this based on his prostate anatomy. - He may benefit from a sleep study, he will discuss this further with his PCP   Return in 6 months for IPSS, PVR, DRE, and PSA.  Will call with prostate MRI results.  I,Kailey Littlejohn,acting as a Education administrator for Hollice Espy, MD.,have documented all relevant documentation on the behalf of Hollice Espy, MD,as directed by  Hollice Espy, MD while in the  presence of Hollice Espy, MD.  I have reviewed the above documentation for accuracy and completeness, and I agree with the above.   Hollice Espy, MD  [facility] [address] [place], [place] [address] [phone]

## 2021-02-22 ENCOUNTER — Encounter: Payer: Self-pay | Admitting: Urology

## 2021-02-22 ENCOUNTER — Ambulatory Visit: Payer: Medicare PPO | Admitting: Urology

## 2021-02-22 ENCOUNTER — Other Ambulatory Visit: Payer: Self-pay

## 2021-02-22 VITALS — BP 183/79 | HR 65 | Ht 68.0 in | Wt 255.0 lb

## 2021-02-22 DIAGNOSIS — C61 Malignant neoplasm of prostate: Secondary | ICD-10-CM

## 2021-02-22 DIAGNOSIS — N138 Other obstructive and reflux uropathy: Secondary | ICD-10-CM | POA: Diagnosis not present

## 2021-02-22 DIAGNOSIS — N401 Enlarged prostate with lower urinary tract symptoms: Secondary | ICD-10-CM

## 2021-02-22 LAB — BLADDER SCAN AMB NON-IMAGING: Scan Result: 20

## 2021-03-01 ENCOUNTER — Other Ambulatory Visit: Payer: Self-pay | Admitting: Family Medicine

## 2021-03-01 ENCOUNTER — Encounter: Payer: Self-pay | Admitting: Ophthalmology

## 2021-03-01 DIAGNOSIS — H2511 Age-related nuclear cataract, right eye: Secondary | ICD-10-CM | POA: Diagnosis not present

## 2021-03-09 NOTE — Discharge Instructions (Signed)

## 2021-03-14 ENCOUNTER — Encounter: Payer: Self-pay | Admitting: Ophthalmology

## 2021-03-14 ENCOUNTER — Encounter
Admission: RE | Disposition: A | Discharge status: Home / Self Care | Payer: Self-pay | Source: Home / Self Care | Attending: Ophthalmology

## 2021-03-14 ENCOUNTER — Other Ambulatory Visit: Payer: Self-pay

## 2021-03-14 ENCOUNTER — Ambulatory Visit: Payer: Medicare PPO | Admitting: Anesthesiology

## 2021-03-14 ENCOUNTER — Ambulatory Visit
Admission: RE | Admit: 2021-03-14 | Discharge: 2021-03-14 | Disposition: A | Discharge status: Home / Self Care | Payer: Medicare PPO | Attending: Ophthalmology | Admitting: Ophthalmology

## 2021-03-14 DIAGNOSIS — I10 Essential (primary) hypertension: Secondary | ICD-10-CM | POA: Diagnosis not present

## 2021-03-14 DIAGNOSIS — Z6838 Body mass index (BMI) 38.0-38.9, adult: Secondary | ICD-10-CM | POA: Insufficient documentation

## 2021-03-14 DIAGNOSIS — E785 Hyperlipidemia, unspecified: Secondary | ICD-10-CM | POA: Diagnosis not present

## 2021-03-14 DIAGNOSIS — M1990 Unspecified osteoarthritis, unspecified site: Secondary | ICD-10-CM | POA: Diagnosis not present

## 2021-03-14 DIAGNOSIS — E669 Obesity, unspecified: Secondary | ICD-10-CM | POA: Diagnosis not present

## 2021-03-14 DIAGNOSIS — K219 Gastro-esophageal reflux disease without esophagitis: Secondary | ICD-10-CM | POA: Diagnosis not present

## 2021-03-14 DIAGNOSIS — H25811 Combined forms of age-related cataract, right eye: Secondary | ICD-10-CM | POA: Diagnosis not present

## 2021-03-14 DIAGNOSIS — F419 Anxiety disorder, unspecified: Secondary | ICD-10-CM | POA: Insufficient documentation

## 2021-03-14 DIAGNOSIS — Z8546 Personal history of malignant neoplasm of prostate: Secondary | ICD-10-CM | POA: Diagnosis not present

## 2021-03-14 DIAGNOSIS — H2511 Age-related nuclear cataract, right eye: Secondary | ICD-10-CM | POA: Diagnosis not present

## 2021-03-14 HISTORY — PX: CATARACT EXTRACTION W/PHACO: SHX586

## 2021-03-14 SURGERY — PHACOEMULSIFICATION, CATARACT, WITH IOL INSERTION
Anesthesia: Monitor Anesthesia Care | Site: Eye | Laterality: Right

## 2021-03-14 MED ORDER — ARMC OPHTHALMIC DILATING DROPS
1.0000 "application " | OPHTHALMIC | Status: DC | PRN
  Administered 2021-03-14 (×3): 1 via OPHTHALMIC

## 2021-03-14 MED ORDER — TETRACAINE HCL 0.5 % OP SOLN
1.0000 [drp] | OPHTHALMIC | Status: DC | PRN
  Administered 2021-03-14 (×3): 1 [drp] via OPHTHALMIC

## 2021-03-14 MED ORDER — ONDANSETRON HCL 4 MG/2ML IJ SOLN: 4.0000 mg | Freq: Once | INTRAMUSCULAR | Status: DC | PRN

## 2021-03-14 MED ORDER — SIGHTPATH DOSE#1 BSS IO SOLN
INTRAOCULAR | Status: DC | PRN
  Administered 2021-03-14: 57 mL via OPHTHALMIC

## 2021-03-14 MED ORDER — FENTANYL CITRATE (PF) 100 MCG/2ML IJ SOLN
INTRAMUSCULAR | Status: DC | PRN
  Administered 2021-03-14: 50 ug via INTRAVENOUS

## 2021-03-14 MED ORDER — SIGHTPATH DOSE#1 BSS IO SOLN
INTRAOCULAR | Status: DC | PRN
  Administered 2021-03-14: 2 mL

## 2021-03-14 MED ORDER — BRIMONIDINE TARTRATE-TIMOLOL 0.2-0.5 % OP SOLN
OPHTHALMIC | Status: DC | PRN
  Administered 2021-03-14: 1 [drp] via OPHTHALMIC

## 2021-03-14 MED ORDER — MOXIFLOXACIN HCL 0.5 % OP SOLN
OPHTHALMIC | Status: DC | PRN
Start: 2021-03-14 — End: 2021-03-14
  Administered 2021-03-14: 0.2 mL via OPHTHALMIC

## 2021-03-14 MED ORDER — SIGHTPATH DOSE#1 BSS IO SOLN
INTRAOCULAR | Status: DC | PRN
  Administered 2021-03-14: 15 mL via INTRAOCULAR

## 2021-03-14 MED ORDER — MIDAZOLAM HCL 2 MG/2ML IJ SOLN
INTRAMUSCULAR | Status: DC | PRN
  Administered 2021-03-14: 1 mg via INTRAVENOUS

## 2021-03-14 MED ORDER — SIGHTPATH DOSE#1 NA CHONDROIT SULF-NA HYALURON 40-17 MG/ML IO SOLN
INTRAOCULAR | Status: DC | PRN
  Administered 2021-03-14: 1 mL via INTRAOCULAR

## 2021-03-14 SURGICAL SUPPLY — 12 items
CANNULA ANT/CHMB 27G (MISCELLANEOUS) IMPLANT
CANNULA ANT/CHMB 27GA (MISCELLANEOUS) IMPLANT
CATARACT SUITE SIGHTPATH (MISCELLANEOUS) ×2 IMPLANT
FEE CATARACT SUITE SIGHTPATH (MISCELLANEOUS) ×1 IMPLANT
GLOVE SURG ENC TEXT LTX SZ8 (GLOVE) ×2 IMPLANT
GLOVE SURG TRIUMPH 8.0 PF LTX (GLOVE) ×2 IMPLANT
LENS IOL TECNIS EYHANCE 18.0 (Intraocular Lens) ×1 IMPLANT
NDL FILTER BLUNT 18X1 1/2 (NEEDLE) ×1 IMPLANT
NEEDLE FILTER BLUNT 18X 1/2SAF (NEEDLE) ×1
NEEDLE FILTER BLUNT 18X1 1/2 (NEEDLE) ×1 IMPLANT
SYR 3ML LL SCALE MARK (SYRINGE) ×2 IMPLANT
WATER STERILE IRR 250ML POUR (IV SOLUTION) ×2 IMPLANT

## 2021-03-14 NOTE — Anesthesia Preprocedure Evaluation (Signed)
Anesthesia Evaluation  Patient identified by MRN, date of birth, ID band Patient awake    Reviewed: Allergy & Precautions, NPO status   Airway Mallampati: II  TM Distance: >3 FB     Dental   Pulmonary    Pulmonary exam normal        Cardiovascular hypertension,  Rhythm:Regular Rate:Normal  HLD   Neuro/Psych PSYCHIATRIC DISORDERS Anxiety    GI/Hepatic GERD  ,  Endo/Other  Obesity - BMI > 30  Renal/GU      Musculoskeletal  (+) Arthritis ,   Abdominal   Peds  Hematology   Anesthesia Other Findings Hx prostate CA  Reproductive/Obstetrics                             Anesthesia Physical Anesthesia Plan  ASA: 3  Anesthesia Plan: MAC   Post-op Pain Management: Minimal or no pain anticipated   Induction: Intravenous  PONV Risk Score and Plan: TIVA, Midazolam and Treatment may vary due to age or medical condition  Airway Management Planned: Natural Airway and Nasal Cannula  Additional Equipment:   Intra-op Plan:   Post-operative Plan:   Informed Consent: I have reviewed the patients History and Physical, chart, labs and discussed the procedure including the risks, benefits and alternatives for the proposed anesthesia with the patient or authorized representative who has indicated his/her understanding and acceptance.       Plan Discussed with: CRNA  Anesthesia Plan Comments:         Anesthesia Quick Evaluation

## 2021-03-14 NOTE — Op Note (Signed)
PREOPERATIVE DIAGNOSIS:  Nuclear sclerotic cataract of the right eye.   POSTOPERATIVE DIAGNOSIS:  Cataract   OPERATIVE PROCEDURE:ORPROCALL@   SURGEON:  Birder Robson, MD.   ANESTHESIA:  Anesthesiologist: Veda Canning, MD CRNA: Silvana Newness, CRNA  1.      Managed anesthesia care. 2.      0.49ml of Shugarcaine was instilled in the eye following the paracentesis.   COMPLICATIONS:  None.   TECHNIQUE:   Stop and chop   DESCRIPTION OF PROCEDURE:  The patient was examined and consented in the preoperative holding area where the aforementioned topical anesthesia was applied to the right eye and then brought back to the Operating Room where the right eye was prepped and draped in the usual sterile ophthalmic fashion and a lid speculum was placed. A paracentesis was created with the side port blade and the anterior chamber was filled with viscoelastic. A near clear corneal incision was performed with the steel keratome. A continuous curvilinear capsulorrhexis was performed with a cystotome followed by the capsulorrhexis forceps. Hydrodissection and hydrodelineation were carried out with BSS on a blunt cannula. The lens was removed in a stop and chop  technique and the remaining cortical material was removed with the irrigation-aspiration handpiece. The capsular bag was inflated with viscoelastic and the Technis [id number]  lens was placed in the capsular bag without complication. The remaining viscoelastic was removed from the eye with the irrigation-aspiration handpiece. The wounds were hydrated. The anterior chamber was flushed with BSS and the eye was inflated to physiologic pressure. 0.16ml of Vigamox was placed in the anterior chamber. The wounds were found to be water tight. The eye was dressed with Combigan. The patient was given protective glasses to wear throughout the day and a shield with which to sleep tonight. The patient was also given drops with which to begin a drop regimen today and will  follow-up with me in one day. Implant Name Type Inv. Item Serial No. Manufacturer Lot No. LRB No. Used Action  LENS IOL TECNIS EYHANCE 18.0 - [id number] Intraocular Lens LENS IOL TECNIS EYHANCE 18.0 [id number] [employer]  Right 1 Implanted   Procedure(s): CATARACT EXTRACTION PHACO AND INTRAOCULAR LENS PLACEMENT (IOC) RIGHT 6.84 00:52.3 (Right)  Electronically signed: Birder Robson 03/14/2021 10:54 AM

## 2021-03-14 NOTE — H&P (Signed)
[facility]   Primary Care Physician:  Duane Haven, MD Ophthalmologist: Dr. George Sanchez  Pre-Procedure History & Physical: HPI:  [name] is a 81 y.o. male here for cataract surgery.   Past Medical History:  Diagnosis Date   Allergy    Anxiety    Arthritis    Cataract    beginning stage bilateral   Diverticulosis    Elevated PSA    Esophageal stricture    Esophagitis    GERD (gastroesophageal reflux disease)    Gout    History of kidney stones    Hyperlipidemia    Hypertension    Inguinal hernia    Internal hemorrhoids    Prostate cancer ([place])    Tubular adenoma of colon     Past Surgical History:  Procedure Laterality Date   BACK SURGERY  1980's   L4/5   BALLOON DILATION N/A 11/17/2018   Procedure: URETHRAL BALLOON DILATION;  Surgeon: Duane Espy, MD;  Location: [facility];  Service: Urology;  Laterality: N/A;   CARPAL TUNNEL RELEASE Right 08/15/2016   Procedure: CARPAL TUNNEL RELEASE ENDOSCOPIC;  Surgeon: Duane Mull, MD;  Location: [facility];  Service: Orthopedics;  Laterality: Right;   COLONOSCOPY     CYSTOSCOPY N/A 11/17/2018   Procedure: CYSTOSCOPY;  Surgeon: Duane Espy, MD;  Location: [facility];  Service: Urology;  Laterality: N/A;   CYSTOSCOPY WITH LITHOLAPAXY N/A 10/09/2017   Procedure: CYSTOSCOPY WITH LITHOLAPAXY;  Surgeon: Duane Espy, MD;  Location: [facility];  Service: Urology;  Laterality: N/A;   ESOPHAGOGASTRODUODENOSCOPY  11-21-06   Esophagitis, Stricture/dilated    HOLEP-LASER ENUCLEATION OF THE PROSTATE WITH MORCELLATION N/A 10/09/2017   Procedure: HOLEP-LASER ENUCLEATION OF THE PROSTATE WITH MORCELLATION;  Surgeon: Duane Espy, MD;  Location: [facility];  Service: Urology;  Laterality: N/A;   LYMPH NODE BIOPSY  1957   benign  lymph node removed   Right arm fracture     TONSILLECTOMY     TOTAL KNEE ARTHROPLASTY  09-02-02   Bilateral   UPPER GASTROINTESTINAL ENDOSCOPY      Prior to Admission medications    Medication Sig Start Date End Date Taking? Authorizing Provider  amLODipine (NORVASC) 10 MG tablet TAKE 1 TABLET BY MOUTH EVERYDAY AT BEDTIME 10/10/20  Yes Duane Haven, MD  aspirin 81 MG tablet Take 81 mg by mouth daily.   Yes [provider]  fluocinonide cream (LIDEX) 7.62 % Apply 1 application topically.   Yes [provider]  metoprolol succinate (TOPROL-XL) 50 MG 24 hr tablet TAKE 1 TABLET BY MOUTH EVERY DAY 03/01/21  Yes Duane Haven, MD  Misc Natural Products (TART CHERRY ADVANCED PO) Take 425 mg by mouth daily.   Yes [provider]  MITIGARE 0.6 MG CAPS TAKE 0.6 MG BY MOUTH DAILY AS NEEDED (GOUT FLARE). 04/12/20  Yes Duane Haven, MD  Multiple Vitamins-Minerals ([employer]S SPECTRAVITE SENIOR) TABS Take 1 tablet by mouth daily.    Yes [provider]  pantoprazole (PROTONIX) 40 MG tablet TAKE 1 TABLET BY MOUTH TWICE A DAY 08/02/20  Yes Duane Haven, MD  Wheat Dextrin (BENEFIBER DRINK MIX PO) Take by mouth.   Yes [provider]    Allergies as of 01/31/2021 - Review Complete 10/13/2020  Allergen Reaction Noted   Penicillins Rash 06/21/2006   Acetaminophen Itching    Aspirin Itching 06/21/2006    Family History  Problem Relation Age of Onset   Ulcers Father    Diverticulitis Father  Arthritis Father    Hypertension Brother    Obesity Brother    Cancer Brother        throat   Arthritis Mother    Depression Mother        Anxiety   Colon cancer Neg Hx    Colon polyps Neg Hx    Esophageal cancer Neg Hx    Rectal cancer Neg Hx    Stomach cancer Neg Hx    Kidney disease Neg Hx    Kidney cancer Neg Hx    Prostate cancer Neg Hx     Social History   Socioeconomic History   Marital status: Married    Spouse name: Not on file   Number of children: 1   Years of education: Not on file   Highest education level: Not on file  Occupational History   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 From [place], [place]. 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 Daughter, [name], lives in [place]. No pets.      Exercise: regular   Diet: increased sweets   Social Determinants of Health   Financial Resource Strain: Low Risk    Difficulty of Paying Living Expenses: Not very hard  Food Insecurity: No Food Insecurity   Worried About Running Out of Food in the Last Year: Never true   Ran Out of Food in the Last Year: Never true  Transportation Needs: No Transportation Needs   Lack of Transportation (Medical): No   Lack of Transportation (Non-Medical): No  Physical Activity: Sufficiently Active   Days of Exercise per Week: 5 days   Minutes of Exercise per Session: 30 min  Stress: No Stress Concern Present   Feeling of Stress : Not at all  Social Connections: Unknown   Frequency of Communication with Friends and Family: Not on file   Frequency of Social Gatherings with Friends and Family: Not on file   Attends Religious Services: Not on Electrical engineer or Organizations: Not on file   Attends Archivist Meetings: Not on file   Marital Status: Married  Human resources officer Violence: Not At Risk   Fear of Current or Ex-Partner: No   Emotionally Abused: No   Physically Abused: No   Sexually Abused: No    Review of Systems: See HPI, otherwise negative ROS  Physical Exam: BP (!) 166/77    Pulse (!) 56    Temp (!) 97.3 F (36.3 C) (Temporal)    Ht 5\' 8"  (1.727 m)    Wt 115.7 kg    SpO2 98%    BMI 38.77 kg/m  General:   Alert, cooperative in NAD Head:  Normocephalic and atraumatic. Respiratory:  Normal work of breathing. Cardiovascular:   RRR  Impression/Plan: [name] is here for cataract surgery.  Risks, benefits, limitations, and alternatives regarding cataract surgery have been reviewed with the patient.  Questions have been answered.  All parties agreeable.   Duane Robson, MD  03/14/2021, 10:29 AM

## 2021-03-14 NOTE — Anesthesia Postprocedure Evaluation (Signed)
Anesthesia Post Note  Patient: [name]  Procedure(s) Performed: CATARACT EXTRACTION PHACO AND INTRAOCULAR LENS PLACEMENT (IOC) RIGHT 6.84 00:52.3 (Right: Eye)     Patient location during evaluation: PACU Anesthesia Type: MAC Level of consciousness: awake Pain management: pain level controlled Vital Signs Assessment: post-procedure vital signs reviewed and stable Respiratory status: respiratory function stable Cardiovascular status: stable Postop Assessment: no signs of nausea or vomiting Anesthetic complications: no   No notable events documented.  Veda Canning

## 2021-03-14 NOTE — Transfer of Care (Addendum)
Immediate Anesthesia Transfer of Care Note  Patient: [name]  Procedure(s) Performed: CATARACT EXTRACTION PHACO AND INTRAOCULAR LENS PLACEMENT (IOC) RIGHT 6.84 00:52.3 (Right: Eye)  Patient Location: PACU  Anesthesia Type: MAC  Level of Consciousness: awake, alert  and patient cooperative  Airway and Oxygen Therapy: Patient Spontanous Breathing and Patient connected to supplemental oxygen  Post-op Assessment: Post-op Vital signs reviewed, Patient's Cardiovascular Status Stable, Respiratory Function Stable, Patent Airway and No signs of Nausea or vomiting  Post-op Vital Signs: Reviewed and stable  Complications: No notable events documented.

## 2021-03-15 ENCOUNTER — Encounter: Payer: Self-pay | Admitting: Ophthalmology

## 2021-03-21 DIAGNOSIS — M9901 Segmental and somatic dysfunction of cervical region: Secondary | ICD-10-CM | POA: Diagnosis not present

## 2021-03-21 DIAGNOSIS — M5136 Other intervertebral disc degeneration, lumbar region: Secondary | ICD-10-CM | POA: Diagnosis not present

## 2021-03-21 DIAGNOSIS — M9903 Segmental and somatic dysfunction of lumbar region: Secondary | ICD-10-CM | POA: Diagnosis not present

## 2021-03-21 DIAGNOSIS — M5033 Other cervical disc degeneration, cervicothoracic region: Secondary | ICD-10-CM | POA: Diagnosis not present

## 2021-03-23 ENCOUNTER — Other Ambulatory Visit: Payer: Self-pay

## 2021-03-23 ENCOUNTER — Ambulatory Visit
Admission: RE | Admit: 2021-03-23 | Discharge: 2021-03-23 | Disposition: A | Discharge status: Home / Self Care | Payer: Medicare PPO | Source: Ambulatory Visit | Attending: Urology | Admitting: Urology

## 2021-03-23 DIAGNOSIS — C61 Malignant neoplasm of prostate: Secondary | ICD-10-CM

## 2021-03-23 DIAGNOSIS — N400 Enlarged prostate without lower urinary tract symptoms: Secondary | ICD-10-CM | POA: Diagnosis not present

## 2021-03-23 DIAGNOSIS — K5730 Diverticulosis of large intestine without perforation or abscess without bleeding: Secondary | ICD-10-CM | POA: Diagnosis not present

## 2021-03-23 DIAGNOSIS — R972 Elevated prostate specific antigen [PSA]: Secondary | ICD-10-CM | POA: Diagnosis not present

## 2021-03-23 DIAGNOSIS — Z8546 Personal history of malignant neoplasm of prostate: Secondary | ICD-10-CM | POA: Diagnosis not present

## 2021-03-23 MED ORDER — GADOBENATE DIMEGLUMINE 529 MG/ML IV SOLN
20.0000 mL | Freq: Once | INTRAVENOUS | Status: AC | PRN
  Administered 2021-03-23: 20 mL via INTRAVENOUS

## 2021-03-23 NOTE — Discharge Instructions (Signed)

## 2021-03-27 ENCOUNTER — Telehealth: Payer: Self-pay | Admitting: *Deleted

## 2021-03-27 ENCOUNTER — Encounter: Payer: Self-pay | Admitting: Urology

## 2021-03-27 NOTE — Telephone Encounter (Addendum)
Patient informed, voiced understanding. Scheduled office visit.   ----- Message from Hollice Espy, MD sent at 03/27/2021  2:54 PM EST ----- Please schedule him a follow-up next week to discuss options.  This looks significantly changed from the previous and more suspicious for an underlying malignancy.  Hollice Espy, MD

## 2021-03-28 ENCOUNTER — Encounter: Payer: Self-pay | Admitting: Ophthalmology

## 2021-03-28 ENCOUNTER — Encounter
Admission: RE | Disposition: A | Discharge status: Home / Self Care | Payer: Self-pay | Source: Home / Self Care | Attending: Ophthalmology

## 2021-03-28 ENCOUNTER — Ambulatory Visit
Admission: RE | Admit: 2021-03-28 | Discharge: 2021-03-28 | Disposition: A | Discharge status: Home / Self Care | Payer: Medicare PPO | Attending: Ophthalmology | Admitting: Ophthalmology

## 2021-03-28 ENCOUNTER — Ambulatory Visit: Payer: Medicare PPO | Admitting: Anesthesiology

## 2021-03-28 ENCOUNTER — Other Ambulatory Visit: Payer: Self-pay

## 2021-03-28 DIAGNOSIS — K5790 Diverticulosis of intestine, part unspecified, without perforation or abscess without bleeding: Secondary | ICD-10-CM | POA: Insufficient documentation

## 2021-03-28 DIAGNOSIS — H2512 Age-related nuclear cataract, left eye: Secondary | ICD-10-CM | POA: Diagnosis not present

## 2021-03-28 DIAGNOSIS — Z8546 Personal history of malignant neoplasm of prostate: Secondary | ICD-10-CM | POA: Insufficient documentation

## 2021-03-28 DIAGNOSIS — I10 Essential (primary) hypertension: Secondary | ICD-10-CM | POA: Insufficient documentation

## 2021-03-28 DIAGNOSIS — M109 Gout, unspecified: Secondary | ICD-10-CM | POA: Insufficient documentation

## 2021-03-28 DIAGNOSIS — M1990 Unspecified osteoarthritis, unspecified site: Secondary | ICD-10-CM | POA: Insufficient documentation

## 2021-03-28 DIAGNOSIS — F419 Anxiety disorder, unspecified: Secondary | ICD-10-CM | POA: Insufficient documentation

## 2021-03-28 DIAGNOSIS — K219 Gastro-esophageal reflux disease without esophagitis: Secondary | ICD-10-CM | POA: Diagnosis not present

## 2021-03-28 DIAGNOSIS — H25812 Combined forms of age-related cataract, left eye: Secondary | ICD-10-CM | POA: Diagnosis not present

## 2021-03-28 HISTORY — PX: CATARACT EXTRACTION W/PHACO: SHX586

## 2021-03-28 SURGERY — PHACOEMULSIFICATION, CATARACT, WITH IOL INSERTION
Anesthesia: Monitor Anesthesia Care | Site: Eye | Laterality: Left

## 2021-03-28 MED ORDER — ARMC OPHTHALMIC DILATING DROPS
1.0000 "application " | OPHTHALMIC | Status: DC | PRN
  Administered 2021-03-28 (×3): 1 via OPHTHALMIC

## 2021-03-28 MED ORDER — MIDAZOLAM HCL 2 MG/2ML IJ SOLN
INTRAMUSCULAR | Status: DC | PRN
  Administered 2021-03-28 (×2): 1 mg via INTRAVENOUS

## 2021-03-28 MED ORDER — SIGHTPATH DOSE#1 BSS IO SOLN
INTRAOCULAR | Status: DC | PRN
  Administered 2021-03-28: 15 mL

## 2021-03-28 MED ORDER — BRIMONIDINE TARTRATE-TIMOLOL 0.2-0.5 % OP SOLN
OPHTHALMIC | Status: DC | PRN
  Administered 2021-03-28: 1 [drp] via OPHTHALMIC

## 2021-03-28 MED ORDER — SIGHTPATH DOSE#1 NA CHONDROIT SULF-NA HYALURON 40-17 MG/ML IO SOLN
INTRAOCULAR | Status: DC | PRN
  Administered 2021-03-28: 1 mL via INTRAOCULAR

## 2021-03-28 MED ORDER — SIGHTPATH DOSE#1 BSS IO SOLN
INTRAOCULAR | Status: DC | PRN
  Administered 2021-03-28: 1 mL via INTRAMUSCULAR

## 2021-03-28 MED ORDER — MOXIFLOXACIN HCL 0.5 % OP SOLN
OPHTHALMIC | Status: DC | PRN
  Administered 2021-03-28: 0.2 mL via OPHTHALMIC

## 2021-03-28 MED ORDER — FENTANYL CITRATE (PF) 100 MCG/2ML IJ SOLN
INTRAMUSCULAR | Status: DC | PRN
  Administered 2021-03-28: 50 ug via INTRAVENOUS

## 2021-03-28 MED ORDER — SIGHTPATH DOSE#1 BSS IO SOLN
INTRAOCULAR | Status: DC | PRN
  Administered 2021-03-28: 10:00:00 83 mL via OPHTHALMIC

## 2021-03-28 MED ORDER — TETRACAINE HCL 0.5 % OP SOLN
1.0000 [drp] | OPHTHALMIC | Status: DC | PRN
  Administered 2021-03-28 (×3): 1 [drp] via OPHTHALMIC

## 2021-03-28 SURGICAL SUPPLY — 10 items
CATARACT SUITE SIGHTPATH (MISCELLANEOUS) ×3 IMPLANT
FEE CATARACT SUITE SIGHTPATH (MISCELLANEOUS) ×1 IMPLANT
GLOVE SURG ENC TEXT LTX SZ8 (GLOVE) ×3 IMPLANT
GLOVE SURG TRIUMPH 8.0 PF LTX (GLOVE) ×3 IMPLANT
LENS IOL TECNIS EYHANCE 18.5 (Intraocular Lens) ×2 IMPLANT
NDL FILTER BLUNT 18X1 1/2 (NEEDLE) ×1 IMPLANT
NEEDLE FILTER BLUNT 18X 1/2SAF (NEEDLE) ×2
NEEDLE FILTER BLUNT 18X1 1/2 (NEEDLE) ×1 IMPLANT
SYR 3ML LL SCALE MARK (SYRINGE) ×3 IMPLANT
WATER STERILE IRR 250ML POUR (IV SOLUTION) ×3 IMPLANT

## 2021-03-28 NOTE — Op Note (Signed)
PREOPERATIVE DIAGNOSIS:  Nuclear sclerotic cataract of the left eye.   POSTOPERATIVE DIAGNOSIS:  Nuclear sclerotic cataract of the left eye.   OPERATIVE PROCEDURE:ORPROCALL@   SURGEON:  Birder Robson, MD.   ANESTHESIA:  Anesthesiologist: Veda Canning, MD CRNA: Cameron Ali, CRNA  1.      Managed anesthesia care. 2.     0.40ml of Shugarcaine was instilled following the paracentesis   COMPLICATIONS:  None.   TECHNIQUE:   Stop and chop   DESCRIPTION OF PROCEDURE:  The patient was examined and consented in the preoperative holding area where the aforementioned topical anesthesia was applied to the left eye and then brought back to the Operating Room where the left eye was prepped and draped in the usual sterile ophthalmic fashion and a lid speculum was placed. A paracentesis was created with the side port blade and the anterior chamber was filled with viscoelastic. A near clear corneal incision was performed with the steel keratome. A continuous curvilinear capsulorrhexis was performed with a cystotome followed by the capsulorrhexis forceps. Hydrodissection and hydrodelineation were carried out with BSS on a blunt cannula. The lens was removed in a stop and chop  technique and the remaining cortical material was removed with the irrigation-aspiration handpiece. The capsular bag was inflated with viscoelastic and the Technis [id number] lens was placed in the capsular bag without complication. The remaining viscoelastic was removed from the eye with the irrigation-aspiration handpiece. The wounds were hydrated. The anterior chamber was flushed with BSS and the eye was inflated to physiologic pressure. 0.15ml Vigamox was placed in the anterior chamber. The wounds were found to be water tight. The eye was dressed with Combigan. The patient was given protective glasses to wear throughout the day and a shield with which to sleep tonight. The patient was also given drops with which to begin a drop regimen  today and will follow-up with me in one day. Implant Name Type Inv. Item Serial No. Manufacturer Lot No. LRB No. Used Action  LENS IOL TECNIS EYHANCE 18.5 - [id number] Intraocular Lens LENS IOL TECNIS EYHANCE 18.5 [id number] [employer]  Left 1 Implanted    Procedure(s): CATARACT EXTRACTION PHACO AND INTRAOCULAR LENS PLACEMENT (IOC) LEFT (Left)  Electronically signed: Birder Robson 03/28/2021 9:54 AM

## 2021-03-28 NOTE — Anesthesia Preprocedure Evaluation (Signed)
Anesthesia Evaluation  Patient identified by MRN, date of birth, ID band Patient awake    Reviewed: Allergy & Precautions, NPO status   Airway Mallampati: II  TM Distance: >3 FB     Dental   Pulmonary    Pulmonary exam normal        Cardiovascular hypertension,  Rhythm:Regular Rate:Normal     Neuro/Psych Anxiety    GI/Hepatic GERD  ,Diverticulosis   Endo/Other    Renal/GU      Musculoskeletal  (+) Arthritis , Gout   Abdominal   Peds  Hematology   Anesthesia Other Findings Hx prostate cancer  Reproductive/Obstetrics                             Anesthesia Physical Anesthesia Plan  ASA: 2  Anesthesia Plan: MAC   Post-op Pain Management: Minimal or no pain anticipated   Induction: Intravenous  PONV Risk Score and Plan: TIVA, Midazolam and Treatment may vary due to age or medical condition  Airway Management Planned: Natural Airway and Nasal Cannula  Additional Equipment:   Intra-op Plan:   Post-operative Plan:   Informed Consent: I have reviewed the patients History and Physical, chart, labs and discussed the procedure including the risks, benefits and alternatives for the proposed anesthesia with the patient or authorized representative who has indicated his/her understanding and acceptance.       Plan Discussed with: CRNA  Anesthesia Plan Comments:         Anesthesia Quick Evaluation

## 2021-03-28 NOTE — H&P (Signed)
[facility]   Primary Care Physician:  Duane Haven, MD Ophthalmologist: Dr.Mysha Sanchez  Pre-Procedure History & Physical: HPI:  [name] is a 81 y.o. male here for cataract surgery.   Past Medical History:  Diagnosis Date   Allergy    Anxiety    Arthritis    Cataract    beginning stage bilateral   Diverticulosis    Elevated PSA    Esophageal stricture    Esophagitis    GERD (gastroesophageal reflux disease)    Gout    History of kidney stones    Hyperlipidemia    Hypertension    Inguinal hernia    Internal hemorrhoids    Prostate cancer ([place])    Tubular adenoma of colon     Past Surgical History:  Procedure Laterality Date   BACK SURGERY  1980's   L4/5   BALLOON DILATION N/A 11/17/2018   Procedure: URETHRAL BALLOON DILATION;  Surgeon: Duane Espy, MD;  Location: [facility];  Service: Urology;  Laterality: N/A;   CARPAL TUNNEL RELEASE Right 08/15/2016   Procedure: CARPAL TUNNEL RELEASE ENDOSCOPIC;  Surgeon: Duane Mull, MD;  Location: [place];  Service: Orthopedics;  Laterality: Right;   CATARACT EXTRACTION W/PHACO Right 03/14/2021   Procedure: CATARACT EXTRACTION PHACO AND INTRAOCULAR LENS PLACEMENT (IOC) RIGHT 6.84 00:52.3;  Surgeon: Duane Robson, MD;  Location: [place];  Service: Ophthalmology;  Laterality: Right;   COLONOSCOPY     CYSTOSCOPY N/A 11/17/2018   Procedure: CYSTOSCOPY;  Surgeon: Duane Espy, MD;  Location: [facility];  Service: Urology;  Laterality: N/A;   CYSTOSCOPY WITH LITHOLAPAXY N/A 10/09/2017   Procedure: CYSTOSCOPY WITH LITHOLAPAXY;  Surgeon: Duane Espy, MD;  Location: [facility];  Service: Urology;  Laterality: N/A;   ESOPHAGOGASTRODUODENOSCOPY  11-21-06   Esophagitis, Stricture/dilated    HOLEP-LASER ENUCLEATION OF THE PROSTATE WITH MORCELLATION N/A 10/09/2017   Procedure: HOLEP-LASER ENUCLEATION OF THE PROSTATE WITH MORCELLATION;  Surgeon: Duane Espy, MD;  Location: [facility];  Service: Urology;   Laterality: N/A;   LYMPH NODE BIOPSY  1957   benign  lymph node removed   Right arm fracture     TONSILLECTOMY     TOTAL KNEE ARTHROPLASTY  09-02-02   Bilateral   UPPER GASTROINTESTINAL ENDOSCOPY      Prior to Admission medications   Medication Sig Start Date End Date Taking? Authorizing Provider  amLODipine (NORVASC) 10 MG tablet TAKE 1 TABLET BY MOUTH EVERYDAY AT BEDTIME 10/10/20  Yes Duane Haven, MD  aspirin 81 MG tablet Take 81 mg by mouth daily.   Yes [provider]  fluocinonide cream (LIDEX) 8.84 % Apply 1 application topically.   Yes [provider]  metoprolol succinate (TOPROL-XL) 50 MG 24 hr tablet TAKE 1 TABLET BY MOUTH EVERY DAY 03/01/21  Yes Duane Haven, MD  Misc Natural Products (TART CHERRY ADVANCED PO) Take 425 mg by mouth daily.   Yes [provider]  MITIGARE 0.6 MG CAPS TAKE 0.6 MG BY MOUTH DAILY AS NEEDED (GOUT FLARE). 04/12/20  Yes Duane Haven, MD  Multiple Vitamins-Minerals (CVS SPECTRAVITE SENIOR) TABS Take 1 tablet by mouth daily.    Yes [provider]  pantoprazole (PROTONIX) 40 MG tablet TAKE 1 TABLET BY MOUTH TWICE A DAY 08/02/20  Yes Duane Haven, MD  Wheat Dextrin (BENEFIBER DRINK MIX PO) Take by mouth.   Yes [provider]    Allergies as of 01/31/2021 - Review Complete 10/13/2020  Allergen Reaction Noted  Penicillins Rash 06/21/2006   Acetaminophen Itching    Aspirin Itching 06/21/2006    Family History  Problem Relation Age of Onset   Ulcers Father    Diverticulitis Father    Arthritis Father    Hypertension Brother    Obesity Brother    Cancer Brother        throat   Arthritis Mother    Depression Mother        Anxiety   Colon cancer Neg Hx    Colon polyps Neg Hx    Esophageal cancer Neg Hx    Rectal cancer Neg Hx    Stomach cancer Neg Hx    Kidney disease Neg Hx    Kidney cancer Neg Hx    Prostate cancer Neg Hx     Social History   Socioeconomic History    Marital status: Married    Spouse name: Not on file   Number of children: 1   Years of education: Not on file   Highest education level: Not on file  Occupational History   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 From [place], [place]. 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 Daughter, [name], lives in [place]. No pets.      Exercise: regular   Diet: increased sweets   Social Determinants of Health   Financial Resource Strain: Low Risk    Difficulty of Paying Living Expenses: Not very hard  Food Insecurity: No Food Insecurity   Worried About Running Out of Food in the Last Year: Never true   Ran Out of Food in the Last Year: Never true  Transportation Needs: No Transportation Needs   Lack of Transportation (Medical): No   Lack of Transportation (Non-Medical): No  Physical Activity: Sufficiently Active   Days of Exercise per Week: 5 days   Minutes of Exercise per Session: 30 min  Stress: No Stress Concern Present   Feeling of Stress : Not at all  Social Connections: Unknown   Frequency of Communication with Friends and Family: Not on file   Frequency of Social Gatherings with Friends and Family: Not on file   Attends Religious Services: Not on Electrical engineer or Organizations: Not on file   Attends Archivist Meetings: Not on file   Marital Status: Married  Human resources officer Violence: Not At Risk   Fear of Current or Ex-Partner: No   Emotionally Abused: No   Physically Abused: No   Sexually Abused: No    Review of Systems: See HPI, otherwise negative ROS  Physical Exam: BP (!) 154/75    Pulse 67    Temp 97.7 F  (36.5 C) (Temporal)    Resp 18    Ht 5\' 8"  (1.727 m)    Wt 116.1 kg    SpO2 99%    BMI 38.92 kg/m  General:   Alert, cooperative in NAD Head:  Normocephalic and atraumatic. Respiratory:  Normal work of breathing. Cardiovascular:  RRR  Impression/Plan: [name] is here for cataract surgery.  Risks, benefits, limitations, and alternatives  regarding cataract surgery have been reviewed with the patient.  Questions have been answered.  All parties agreeable.   Duane Robson, MD  03/28/2021, 9:30 AM

## 2021-03-28 NOTE — Transfer of Care (Signed)
Immediate Anesthesia Transfer of Care Note  Patient: [name]  Procedure(s) Performed: CATARACT EXTRACTION PHACO AND INTRAOCULAR LENS PLACEMENT (IOC) LEFT (Left: Eye)  Patient Location: PACU  Anesthesia Type: MAC  Level of Consciousness: awake, alert  and patient cooperative  Airway and Oxygen Therapy: Patient Spontanous Breathing and Patient connected to supplemental oxygen  Post-op Assessment: Post-op Vital signs reviewed, Patient's Cardiovascular Status Stable, Respiratory Function Stable, Patent Airway and No signs of Nausea or vomiting  Post-op Vital Signs: Reviewed and stable  Complications: No notable events documented.

## 2021-03-28 NOTE — Anesthesia Postprocedure Evaluation (Signed)
Anesthesia Post Note  Patient: [name]  Procedure(s) Performed: CATARACT EXTRACTION PHACO AND INTRAOCULAR LENS PLACEMENT (IOC) LEFT (Left: Eye)     Patient location during evaluation: PACU Anesthesia Type: MAC Level of consciousness: awake Pain management: pain level controlled Vital Signs Assessment: post-procedure vital signs reviewed and stable Respiratory status: respiratory function stable Cardiovascular status: stable Postop Assessment: no apparent nausea or vomiting Anesthetic complications: no   No notable events documented.  Veda Canning

## 2021-03-29 ENCOUNTER — Encounter: Payer: Self-pay | Admitting: Ophthalmology

## 2021-04-03 NOTE — Progress Notes (Signed)
04/04/21 4:20 PM   [name] 04/11/1940 [id number]  Referring provider:  Leone Haven, MD [address] [place],  [place] [address] Chief Complaint  Patient presents with   Benign Prostatic Hypertrophy   Prostate Cancer     HPI: [name] is a 81 y.o.male  prostate cancer, cancer, recurrent gross hematuria, urethral stricture, and BPH, who presents today to discuss MRI results.   He is s/p HoLEP and cystolitholapaxy in 2019.  He was doing well but ultimately developed recurrent gross hematuria secondary to dystrophic calcifications and was also found to have a bulbar urethral stricture.  He returned to the operating room on 11/17/2018 for dilation of bulbar urethral stricture.  Foley was left in place for 3 days.   He had a negative prostate biopsy in 2017.  Preop prostate volume 120 cc.   New baseline PSA is 3.4 ng/mL from 01/17/18 following surgery.   Since then, his PSA has been fluctuating.  His most recent PSA today is pending.     He underwent prostate MRI on 08/19/2018 which showed a nodular transition zone consistent with BPH, PI-RADS 1 and some incidental asymmetry of the seminal vesicle, otherwise no evidence of high-grade lesions.  Newly calculated prostatic volume 56 cc.   He had a prostate bx on 08/12/19. His path report indicates 2 of 12 cores involved low volume Gleason 3+3 up to 6% of tissue primarily at right mid and right apex. Previous cysto revealed regrowth of right lobe only which was also appreciated on TRUS. TRUS 70 g   Prostate MRI on 03/23/2021 revealed PI-RADS category 5 lesion of the right peripheral zone of the apex. This has greater than 1.5 cm of capsular contact which can predispose to occult transcapsular spread. Targeting data sent to [facility]. Moderate prostatomegaly and benign prostatic hypertrophy.  He reports today that he is urinating adequately. He reports today that he strains during bowel movements which causes blood to be in  his stool.    PMH: Past Medical History:  Diagnosis Date   Allergy    Anxiety    Arthritis    Cataract    beginning stage bilateral   Diverticulosis    Elevated PSA    Esophageal stricture    Esophagitis    GERD (gastroesophageal reflux disease)    Gout    History of kidney stones    Hyperlipidemia    Hypertension    Inguinal hernia    Internal hemorrhoids    Prostate cancer ([place])    Tubular adenoma of colon     Surgical History: Past Surgical History:  Procedure Laterality Date   BACK SURGERY  1980's   L4/5   BALLOON DILATION N/A 11/17/2018   Procedure: URETHRAL BALLOON DILATION;  Surgeon: Hollice Espy, MD;  Location: [facility];  Service: Urology;  Laterality: N/A;   CARPAL TUNNEL RELEASE Right 08/15/2016   Procedure: CARPAL TUNNEL RELEASE ENDOSCOPIC;  Surgeon: Corky Mull, MD;  Location: [facility];  Service: Orthopedics;  Laterality: Right;   CATARACT EXTRACTION W/PHACO Right 03/14/2021   Procedure: CATARACT EXTRACTION PHACO AND INTRAOCULAR LENS PLACEMENT (IOC) RIGHT 6.84 00:52.3;  Surgeon: Birder Robson, MD;  Location: [place] [facility];  Service: Ophthalmology;  Laterality: Right;   CATARACT EXTRACTION W/PHACO Left 03/28/2021   Procedure: CATARACT EXTRACTION PHACO AND INTRAOCULAR LENS PLACEMENT ([place]) LEFT;  Surgeon: Birder Robson, MD;  Location: [facility];  Service: Ophthalmology;  Laterality: Left;   COLONOSCOPY     CYSTOSCOPY  N/A 11/17/2018   Procedure: CYSTOSCOPY;  Surgeon: Hollice Espy, MD;  Location: [facility];  Service: Urology;  Laterality: N/A;   CYSTOSCOPY WITH LITHOLAPAXY N/A 10/09/2017   Procedure: CYSTOSCOPY WITH LITHOLAPAXY;  Surgeon: Hollice Espy, MD;  Location: [facility];  Service: Urology;  Laterality: N/A;   ESOPHAGOGASTRODUODENOSCOPY  11-21-06   Esophagitis, Stricture/dilated    HOLEP-LASER ENUCLEATION OF THE PROSTATE WITH MORCELLATION N/A 10/09/2017   Procedure: HOLEP-LASER ENUCLEATION OF THE PROSTATE WITH  MORCELLATION;  Surgeon: Hollice Espy, MD;  Location: [facility];  Service: Urology;  Laterality: N/A;   LYMPH NODE BIOPSY  1957   benign  lymph node removed   Right arm fracture     TONSILLECTOMY     TOTAL KNEE ARTHROPLASTY  09-02-02   Bilateral   UPPER GASTROINTESTINAL ENDOSCOPY      Home Medications:  Allergies as of 04/04/2021       Reactions   Penicillins Rash   Did it involve swelling of the face/tongue/throat, SOB, or low BP? No Did it involve sudden or severe rash/hives, skin peeling, or any reaction on the inside of your mouth or nose? No Did you need to seek medical attention at a hospital or doctor's office? No When did it last happen?      50 Years If all above answers are NO, may proceed with cephalosporin use.   Acetaminophen Itching   Aspirin Itching   OK w/low dose aspirin         Medication List        Accurate as of April 04, 2021  4:20 PM. If you have any questions, ask your nurse or doctor.          STOP taking these medications    fluocinonide cream 0.05 % Commonly known as: LIDEX Stopped by: Hollice Espy, MD       TAKE these medications    amLODipine 10 MG tablet Commonly known as: NORVASC TAKE 1 TABLET BY MOUTH EVERYDAY AT BEDTIME   aspirin 81 MG tablet Take 81 mg by mouth daily.   BENEFIBER DRINK MIX PO Take by mouth.   CVS Spectravite Senior Tabs Take 1 tablet by mouth daily.   metoprolol succinate 50 MG 24 hr tablet Commonly known as: TOPROL-XL TAKE 1 TABLET BY MOUTH EVERY DAY   Mitigare 0.6 MG Caps Generic drug: Colchicine TAKE 0.6 MG BY MOUTH DAILY AS NEEDED (GOUT FLARE).   pantoprazole 40 MG tablet Commonly known as: PROTONIX TAKE 1 TABLET BY MOUTH TWICE A DAY   TART CHERRY ADVANCED PO Take 425 mg by mouth daily.        Allergies:  Allergies  Allergen Reactions   Penicillins Rash    Did it involve swelling of the face/tongue/throat, SOB, or low BP? No Did it involve sudden or severe rash/hives, skin  peeling, or any reaction on the inside of your mouth or nose? No Did you need to seek medical attention at a hospital or doctor's office? No When did it last happen?      50 Years If all above answers are NO, may proceed with cephalosporin use.    Acetaminophen Itching   Aspirin Itching    OK w/low dose aspirin     Family History: Family History  Problem Relation Age of Onset   Ulcers Father    Diverticulitis Father    Arthritis Father    Hypertension Brother    Obesity Brother    Cancer Brother        throat  Arthritis Mother    Depression Mother        Anxiety   Colon cancer Neg Hx    Colon polyps Neg Hx    Esophageal cancer Neg Hx    Rectal cancer Neg Hx    Stomach cancer Neg Hx    Kidney disease Neg Hx    Kidney cancer Neg Hx    Prostate cancer Neg Hx     Social History:  reports that he has never smoked. He has never used smokeless tobacco. He reports that he does not drink alcohol and does not use drugs.   Physical Exam: BP (!) 173/82    Pulse (!) 59    Ht 5\' 8"  (1.727 m)    Wt 255 lb (115.7 kg)    BMI 38.77 kg/m   Constitutional:  Alert and oriented, No acute distress. HEENT: [place] AT, moist mucus membranes.  Trachea midline, no masses. Cardiovascular: No clubbing, cyanosis, or edema. Respiratory: Normal respiratory effort, no increased work of breathing. Skin: No rashes, bruises or suspicious lesions. Neurologic: Grossly intact, no focal deficits, moving all 4 extremities. Psychiatric: Normal mood and affect.  Laboratory Data:  Lab Results  Component Value Date   CREATININE 1.42 02/04/2020   Lab Results  Component Value Date   PSA 6.41 (H) 02/04/2020   PSA 5.45 (H) 07/20/2015   PSA 6.58 (H) 07/16/2014   Lab Results  Component Value Date   HGBA1C 5.6 06/26/2006    Pertinent Imaging: CLINICAL DATA:  Elevated and increasing PSA level of 13.1 on 02/20/2021; prior diagnosis of prostate cancer   EXAM: MR PROSTATE WITHOUT AND WITH CONTRAST    TECHNIQUE: Multiplanar multisequence MRI images were obtained of the pelvis centered about the prostate. Pre and post contrast images were obtained.   CONTRAST:  8mL MULTIHANCE GADOBENATE DIMEGLUMINE 529 MG/ML IV SOLN   COMPARISON:  08/18/2018   FINDINGS: Prostate:   Encapsulated nodularity in the transition zone compatible with benign prostatic hypertrophy. Asymmetry along the base of the prostate gland likely at least partially related to prior transurethral resection of the prostate.   Region of interest # 1: Newly appreciable PI-RADS category 5 lesion of the right anterior, posterolateral, and posteromedial peripheral zone at the apex, with focally reduced T2 signal (image 72, series 10), focal early enhancement (image 153, series 14), and focal restriction of diffusion (image 22, series 8). This measures 2.29 cc (2.3 by 1.3 by 1.4 cm). There is greater than 1.5 cm of capsular abutment. Along the margins of this process there is some faintly accentuated T1 signal for example on images 31 through 36 of series 5 -1.   Volume: 3D volumetric assessment: Prostate volume 76.87 cc (5.2 by 4.8 by 6.3 cm).   Transcapsular spread: Not directly seen but there is greater than 1.5 cm of capsular contact of region of interest # 1 with the capsule which can predispose to occult transcapsular spread.   Seminal vesicle involvement: Absent   Neurovascular bundle involvement: Absent   Pelvic adenopathy: Absent   Bone metastasis: Absent   Other findings: Sigmoid colon diverticulosis. Iliac atherosclerosis. Degenerative spurring of both hips.   IMPRESSION: 1. PI-RADS category 5 lesion of the right peripheral zone of the apex. This has greater than 1.5 cm of capsular contact which can predispose to occult transcapsular spread. Targeting data sent to [facility]. 2. Moderate prostatomegaly and benign prostatic hypertrophy. 3. Other imaging findings of potential clinical  significance: Sigmoid colon diverticulosis. Iliac atherosclerosis. Degenerative bilateral hip arthropathy.  Electronically Signed   By: Van Clines M.D.   On: 03/24/2021 13:16  The MRI was personally reviewed.  Agree with radiologic interpretation.  There is concern for possible locally aggressive higher grade clinically significant lesion.  Assessment & Plan:    Prostate cancer/ rising PSA - Discussed PI-RADS today  - We discussed fusion biopsy in detail today including risk and benefits - Referral sent to [facility] for prostate biopsy.  Return for fusion biopsy results   I,Kailey Littlejohn,acting as a scribe for Hollice Espy, MD.,have documented all relevant documentation on the behalf of Hollice Espy, MD,as directed by  Hollice Espy, MD while in the presence of Hollice Espy, MD.  I have reviewed the above documentation for accuracy and completeness, and I agree with the above.   Hollice Espy, MD   [facility] [address] [place], [place] [address] [phone]

## 2021-04-04 ENCOUNTER — Other Ambulatory Visit: Payer: Self-pay

## 2021-04-04 ENCOUNTER — Ambulatory Visit: Payer: Medicare PPO | Admitting: Urology

## 2021-04-04 VITALS — BP 173/82 | HR 59 | Ht 68.0 in | Wt 255.0 lb

## 2021-04-04 DIAGNOSIS — C61 Malignant neoplasm of prostate: Secondary | ICD-10-CM | POA: Diagnosis not present

## 2021-04-04 DIAGNOSIS — R972 Elevated prostate specific antigen [PSA]: Secondary | ICD-10-CM

## 2021-04-05 ENCOUNTER — Ambulatory Visit: Payer: Medicare PPO | Admitting: Internal Medicine

## 2021-04-05 ENCOUNTER — Encounter: Payer: Self-pay | Admitting: Internal Medicine

## 2021-04-05 VITALS — BP 134/70 | HR 64 | Resp 16 | Ht 68.0 in | Wt 260.2 lb

## 2021-04-05 DIAGNOSIS — K5900 Constipation, unspecified: Secondary | ICD-10-CM

## 2021-04-05 NOTE — Patient Instructions (Addendum)
Stop Colace.   Start Miralax - Dissolve 1 capful in at at least 8 ounces of water daily.   Continue Benefiber 2 teaspoons twice daily.   Increase water intake daily.   Follow up in 3 months. Office will contact you to schedule at a later time.   If you are age 82 or older, your body mass index should be between 23-30. Your Body mass index is 39.56 kg/m. If this is out of the aforementioned range listed, please consider follow up with your Primary Care Provider.  If you are age 42 or younger, your body mass index should be between 19-25. Your Body mass index is 39.56 kg/m. If this is out of the aformentioned range listed, please consider follow up with your Primary Care Provider.   ________________________________________________________  The [facility] providers would like to encourage you to use [facility] to communicate with providers for non-urgent requests or questions.  Due to long hold times on the telephone, sending your provider a message by [facility] may be a faster and more efficient way to get a response.  Please allow 48 business hours for a response.  Please remember that this is for non-urgent requests.  _______________________________________________________  Thank you for choosing me and [facility].  Dr. Ulice Dash [name]

## 2021-04-06 ENCOUNTER — Telehealth: Payer: Self-pay

## 2021-04-06 ENCOUNTER — Encounter: Payer: Self-pay | Admitting: Internal Medicine

## 2021-04-06 NOTE — Telephone Encounter (Signed)
I spoke with patient and he states his primary care suggested taking the ASA . He states he does not need a clearance.

## 2021-04-06 NOTE — Telephone Encounter (Signed)
Left message for pt to return my call. Pt is on ASA. Pt may need medical clearance prior to fusion biopsy

## 2021-04-06 NOTE — Progress Notes (Signed)
° °  Subjective:    Patient ID: [name], male    DOB: 08/11/1940, 81 y.o.   MRN: [id number]  HPI [name] is an 81 year old male with a history of colonic polyps, internal hemorrhoids, diverticulosis, GERD, prior prostate cancer with recent probable recurrence, hypertension and hyperlipidemia who is here for follow-up.  He is here alone today and was last seen in March 2022.  He reports that he has recently been diagnosed with probable recurrent prostate cancer.  He originally had a TURP 2 years ago and most recently his PSA levels have become more elevated compared to his baseline.  He is recently had an MRI and will have another prostate biopsy soon.  In December he noticed that he is becoming more constipated.  He has been on Colace daily for years.  He missed having a bowel movement for 3 to 4 days became quite uncomfortable with bloating and lower abdominal pressure.  He took an over-the-counter laxative which caused severe cramping at night.  This increase the urge for bowel movement but he did not have a good evacuation until he repeated this dose the next night.  Since this time he is increase the fiber in his diet by eating additional salads, fresh fruits and vegetables.  He is back with Benefiber and he also increase the dose.  He is drinking grape juice.  Bowel movements have improved but are not quite back to normal.  At times he has increased urgency for defecation with less morning than usual before bowel movement.  No blood in stool or melena.  No upper GI or hepatobiliary complaint.   Review of Systems As per HPI, otherwise negative  Current Medications, Allergies, Past Medical History, Past Surgical History, Family History and Social History were reviewed in [employer] record.    Objective:   Physical Exam BP 134/70 (BP Location: Right Arm, Patient Position: Sitting, Cuff Size: Normal)    Pulse 64    Resp 16    Ht 5\' 8"  (1.727 m)    Wt 260 lb 3.2 oz  (118 kg)    SpO2 97%    BMI 39.56 kg/m  Gen: awake, alert, NAD HEENT: anicteric Pulm: CTA b/l Abd: soft, NT/ND, +BS throughout Ext: no c/c/e Neuro: nonfocal      Assessment & Plan:   81 year old male with a history of colonic polyps, internal hemorrhoids, diverticulosis, GERD, prior prostate cancer with recent probable recurrence, hypertension and hyperlipidemia who is here for follow-up.   Constipation --we discussed symptoms and response to what sounds like a spasmodic laxative likely bisacodyl.  He would likely do better with an osmotic laxative as well as ongoing fiber supplementation and more liberal fluid intake.  We reviewed this together today.  Plan as follows: --Begin MiraLAX 17 g once to twice daily.  This medication can be titrated for effect --Continue Benefiber targeting 2 teaspoons twice daily --Stop Colace in favor of adding MiraLAX --Avoid spasmodic OTC laxatives --Increase water and hydration --57-month follow-up, sooner if needed  2.  CRC screening/history of colon polyps --small subcentimeter adenoma removed in February 2019.  Based on age she will not need to repeat colonoscopy for screening and surveillance.  30 minutes total spent today including patient facing time, coordination of care, reviewing medical history/procedures/pertinent radiology studies, and documentation of the encounter.

## 2021-04-10 ENCOUNTER — Other Ambulatory Visit: Payer: Self-pay | Admitting: Family Medicine

## 2021-04-10 DIAGNOSIS — I10 Essential (primary) hypertension: Secondary | ICD-10-CM

## 2021-04-17 DIAGNOSIS — Z961 Presence of intraocular lens: Secondary | ICD-10-CM | POA: Diagnosis not present

## 2021-04-18 DIAGNOSIS — M9901 Segmental and somatic dysfunction of cervical region: Secondary | ICD-10-CM | POA: Diagnosis not present

## 2021-04-18 DIAGNOSIS — M9903 Segmental and somatic dysfunction of lumbar region: Secondary | ICD-10-CM | POA: Diagnosis not present

## 2021-04-18 DIAGNOSIS — M5033 Other cervical disc degeneration, cervicothoracic region: Secondary | ICD-10-CM | POA: Diagnosis not present

## 2021-04-18 DIAGNOSIS — M5136 Other intervertebral disc degeneration, lumbar region: Secondary | ICD-10-CM | POA: Diagnosis not present

## 2021-04-25 IMAGING — MR MR PROSTATE WO/W CM
48 series · 48 of 48 positions shown · IV contrast (gadavist)
Comparison: None

CLINICAL DATA: Elevated PSA equal 6.1. patient status post
treatment for benign prostate hypertrophy with incidental discovery
of prostate adenocarcinoma. 10 mL Gadavist

EXAM:
MR PROSTATE WITHOUT AND WITH CONTRAST
TECHNIQUE: Multiplanar multisequence MRI images were obtained of the pelvis
centered about the prostate. Pre and post contrast images were
obtained.
CONTRAST:  10 mL Gadavist

[Series 4: T1 · axial · 8.0mm · 0.74mm/px · 1 of 25 slices shown (1 of 38)]
[im 1/25]
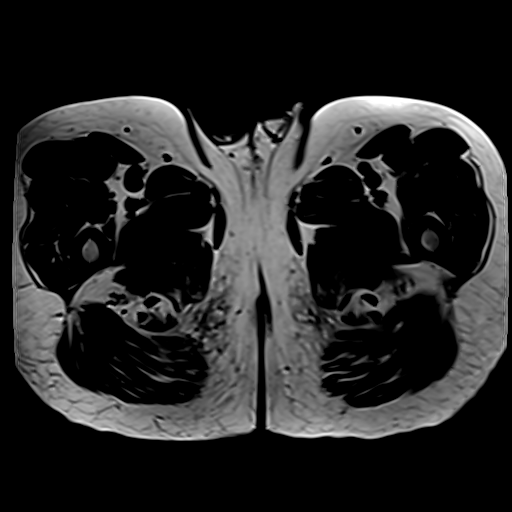

[Series 5: bSSFP fat-sat · axial · 8.0mm · 0.74mm/px · 1 of 25 slices shown]
[im 1/25]
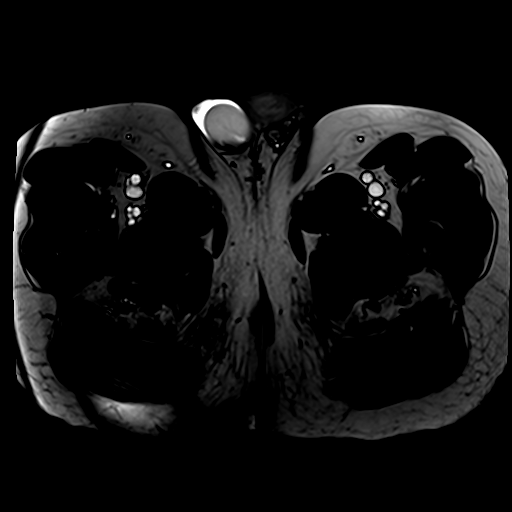

[Series 7: T2 · sagittal · 3.5mm · 0.62mm/px · 1 of 35 slices shown (1 of 3)]
[im 1/35]
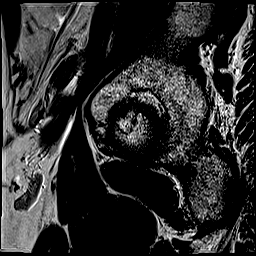

[Series 8: T1 · axial · 3.0mm · 0.35mm/px · 1 of 30 slices shown (2 of 38)]
[im 1/30]
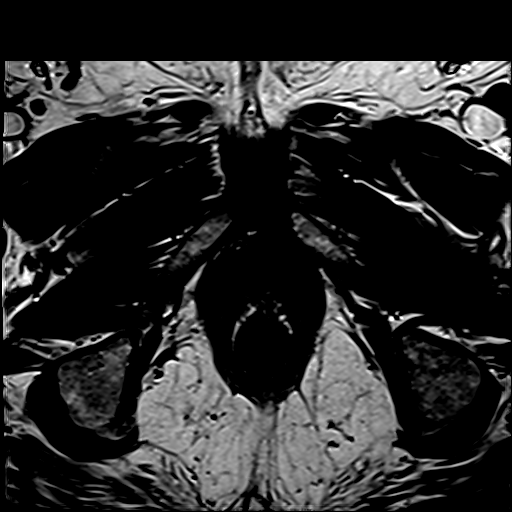

[Series 9: T2 · coronal · 3.0mm · 0.70mm/px · 1 of 30 slices shown (2 of 3)]
[im 1/30]
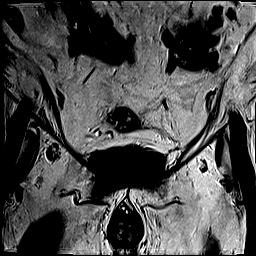

[Series 10: T2 · axial · 3.5mm · 0.56mm/px · 1 of 23 slices shown (3 of 3)]
[im 1/23]
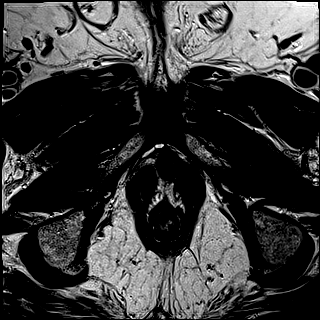

[Series 11: t2_space_tra (id) · axial · 1.0mm · 1.04mm/px · 1 of 80 slices shown]
[im 1/80]
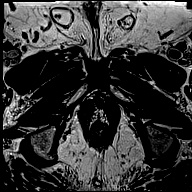

[Series 12: ax dwi_tracew · axial · 3.0mm · 0.78mm/px · 1 of 25 slices shown (1 of 3)]
[im 1/25]
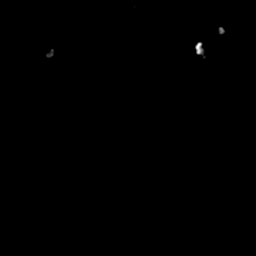

[Series 12: ax dwi_tracew · axial · 3.0mm · 0.78mm/px · 1 of 25 slices shown (2 of 3)]
[im 1/25]
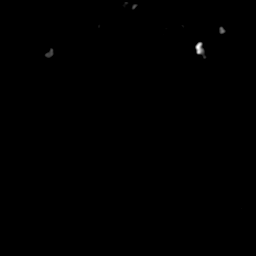

[Series 12: ax dwi_tracew · axial · 3.0mm · 0.78mm/px · 1 of 25 slices shown (3 of 3)]
[im 1/25]
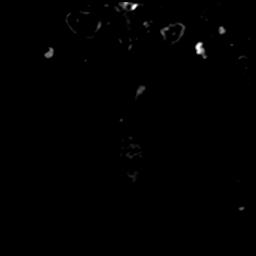

[Series 13: ax dwi_adc · axial · 3.0mm · 0.78mm/px · 1 of 25 slices shown]
[im 1/25]
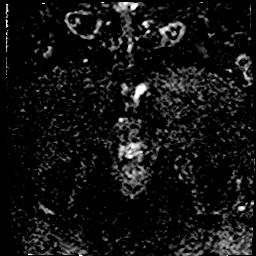

[Series 14: ax dwi_calc_bval · axial · 3.0mm · 0.78mm/px · 1 of 25 slices shown]
[im 1/25]
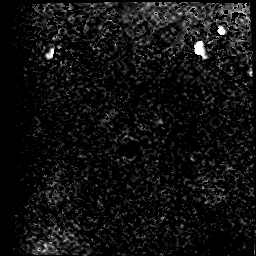

[Series 15: T1 · axial · 3.0mm · 1.15mm/px · 1 of 28 slices shown (3 of 38)]
[im 1/28]
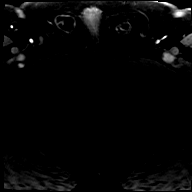

[Series 16: T1 · axial · 3.0mm · 1.15mm/px · 1 of 28 slices shown (4 of 38)]
[im 1/28]
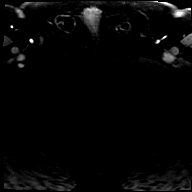

[Series 17: T1 · axial · 3.0mm · 1.15mm/px · 1 of 27 slices shown (5 of 38)]
[im 1/27]
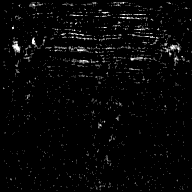

[Series 18: T1 · axial · 3.0mm · 1.15mm/px · 1 of 28 slices shown (6 of 38)]
[im 1/28]
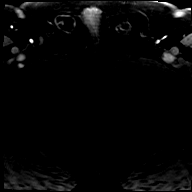

[Series 19: T1 · axial · 3.0mm · 1.15mm/px · 1 of 27 slices shown (7 of 38)]
[im 1/27]
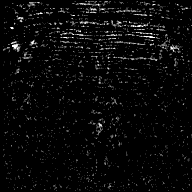

[Series 20: T1 · axial · 3.0mm · 1.15mm/px · 1 of 28 slices shown (8 of 38)]
[im 1/28]
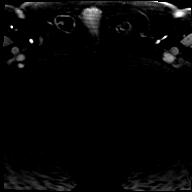

[Series 21: T1 · axial · 3.0mm · 1.15mm/px · 1 of 28 slices shown (9 of 38)]
[im 1/28]
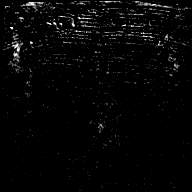

[Series 22: T1 · axial · 3.0mm · 1.15mm/px · 1 of 28 slices shown (10 of 38)]
[im 1/28]
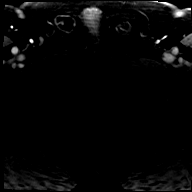

[Series 23: T1 · axial · 3.0mm · 1.15mm/px · 1 of 28 slices shown (11 of 38)]
[im 1/28]
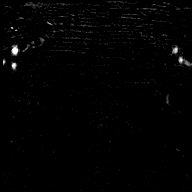

[Series 24: T1 · axial · 3.0mm · 1.15mm/px · 1 of 28 slices shown (12 of 38)]
[im 1/28]
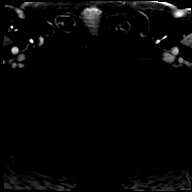

[Series 25: T1 · axial · 3.0mm · 1.15mm/px · 1 of 27 slices shown (13 of 38)]
[im 1/27]
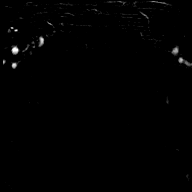

[Series 26: T1 · axial · 3.0mm · 1.15mm/px · 1 of 28 slices shown (14 of 38)]
[im 1/28]
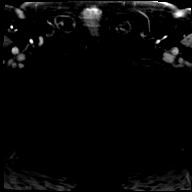

[Series 27: T1 · axial · 3.0mm · 1.15mm/px · 1 of 28 slices shown (15 of 38)]
[im 1/28]
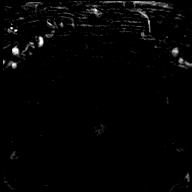

[Series 28: T1 · axial · 3.0mm · 1.15mm/px · 1 of 28 slices shown (16 of 38)]
[im 1/28]
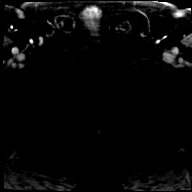

[Series 29: T1 · axial · 3.0mm · 1.15mm/px · 1 of 28 slices shown (17 of 38)]
[im 1/28]
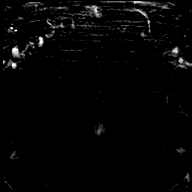

[Series 30: T1 · axial · 3.0mm · 1.15mm/px · 1 of 28 slices shown (18 of 38)]
[im 1/28]
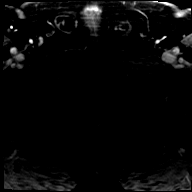

[Series 31: T1 · axial · 3.0mm · 1.15mm/px · 1 of 28 slices shown (19 of 38)]
[im 1/28]
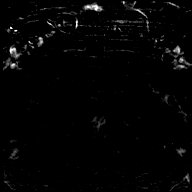

[Series 32: T1 · axial · 3.0mm · 1.15mm/px · 1 of 28 slices shown (20 of 38)]
[im 1/28]
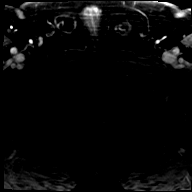

[Series 33: T1 · axial · 3.0mm · 1.15mm/px · 1 of 27 slices shown (21 of 38)]
[im 1/27]
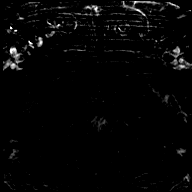

[Series 34: T1 · axial · 3.0mm · 1.15mm/px · 1 of 28 slices shown (22 of 38)]
[im 1/28]
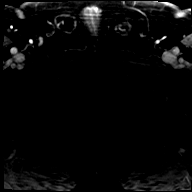

[Series 35: T1 · axial · 3.0mm · 1.15mm/px · 1 of 27 slices shown (23 of 38)]
[im 1/27]
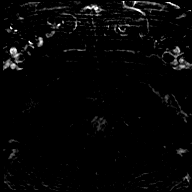

[Series 36: T1 · axial · 3.0mm · 1.15mm/px · 1 of 28 slices shown (24 of 38)]
[im 1/28]
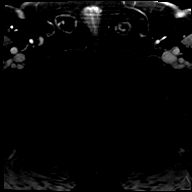

[Series 37: T1 · axial · 3.0mm · 1.15mm/px · 1 of 27 slices shown (25 of 38)]
[im 1/27]
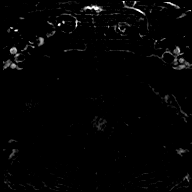

[Series 38: T1 · axial · 3.0mm · 1.15mm/px · 1 of 28 slices shown (26 of 38)]
[im 1/28]
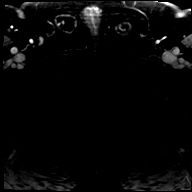

[Series 39: T1 · axial · 3.0mm · 1.15mm/px · 1 of 24 slices shown (27 of 38)]
[im 1/24]
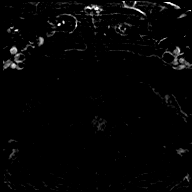

[Series 40: T1 · axial · 3.0mm · 1.15mm/px · 1 of 28 slices shown (28 of 38)]
[im 1/28]
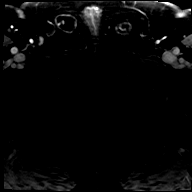

[Series 41: T1 · axial · 3.0mm · 1.15mm/px · 1 of 28 slices shown (29 of 38)]
[im 1/28]
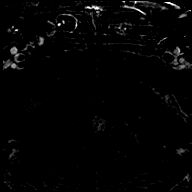

[Series 42: T1 · axial · 3.0mm · 1.15mm/px · 1 of 28 slices shown (30 of 38)]
[im 1/28]
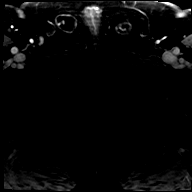

[Series 43: T1 · axial · 3.0mm · 1.15mm/px · 1 of 25 slices shown (31 of 38)]
[im 1/25]
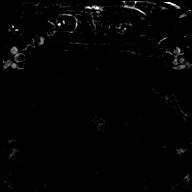

[Series 44: T1 · axial · 3.0mm · 1.15mm/px · 1 of 28 slices shown (32 of 38)]
[im 1/28]
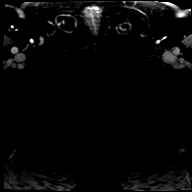

[Series 45: T1 · axial · 3.0mm · 1.15mm/px · 1 of 24 slices shown (33 of 38)]
[im 1/24]
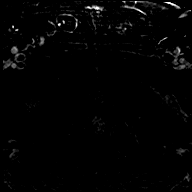

[Series 46: T1 · axial · 3.0mm · 1.15mm/px · 1 of 28 slices shown (34 of 38)]
[im 1/28]
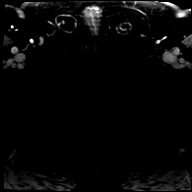

[Series 47: T1 · axial · 3.0mm · 1.15mm/px · 1 of 23 slices shown (35 of 38)]
[im 1/23]
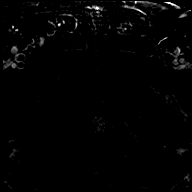

[Series 48: T1 · axial · 3.0mm · 1.15mm/px · 1 of 28 slices shown (36 of 38)]
[im 1/28]
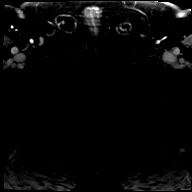

[Series 49: T1 · axial · 3.0mm · 1.15mm/px · 1 of 22 slices shown (37 of 38)]
[im 1/22]
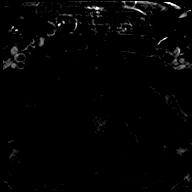

[Series 50: T1 · axial · 3.0mm · 1.15mm/px · 1 of 28 slices shown (38 of 38)]
[im 1/28]
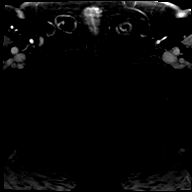

[48 of 48 positions shown; findings below may reference images not displayed]

FINDINGS: Prostate: There is asymmetry of the peripheral zone with the LEFT
peripheral zone smaller than the RIGHT. There is no focal loss of
signal intensity on T2 weighted imaging within the peripheral zone
(series 10).

No foci of restricted diffusion within the peripheral zone (series
13).

The transitional zone is enlarged by capsulated nodules. Surgical
defect related to outlet obstruction relief.

Asymmetry of the seminal vesicles with the LEFT seminal vesicle
smaller than the RIGHT

Volume: 5.7 x 4.2 x 4.5 cm (volume = 56 cm^3)

Transcapsular spread:  Absent

Seminal vesicle involvement: Absent

Neurovascular bundle involvement: Absent

Pelvic adenopathy: Absent

Bone metastasis: Absent

Other findings: None
IMPRESSION: 1. No high-grade carcinoma in the peripheral zone.
2. Nodular transitional zone consistent with benign prostate
hypertrophy ( PI-RADS: 1).
3. Asymmetry of the gland with the LEFT peripheral zone Seminal
vesicle smaller than the RIGHT.
4. Procedural defect in the central gland related to outlet
obstruction relief.

## 2021-05-02 DIAGNOSIS — C61 Malignant neoplasm of prostate: Secondary | ICD-10-CM | POA: Diagnosis not present

## 2021-05-04 ENCOUNTER — Other Ambulatory Visit: Payer: Self-pay | Admitting: Urology

## 2021-05-09 NOTE — Progress Notes (Signed)
05/09/21 1:43 PM   [name] 02-26-1941 [id number]  Referring provider:  Leone Haven, MD [address] [place],  [place] [address] Chief Complaint  Patient presents with   Results     HPI: [name] is a 81 y.o.male with a personal history of prostate cancer, recurrent gross hematuria, urethral stricture, and BPH who presents today for fusion biopsy results.   He is s/p HoLEP and cystolitholapaxy in 2019.  He was doing well but ultimately developed recurrent gross hematuria secondary to dystrophic calcifications and was also found to have a bulbar urethral stricture.  He returned to the operating room on 11/17/2018 for dilation of bulbar urethral stricture.  He underwent a prostate biopsy in 2017 that was negative. Prostate volume preoperatively was 120 cc. His new baseline PSA is 3.4 mL. His most recent PSA is 13.1 and was steadily trending upwards with surveillance.   His prostate MRI on 08/19/2018 visualized nodular transition zone that was consistent with BPH, and PI-RADS 1 with some incidental asymmetry of the seminal vesicle. Prostatic volume was calculated to be 56 cc.  Repeat prostate biopsy on 08/12/2019 revealed surgical pathology consistent with low volume Gleason 3+3 up to 6 % involving 2 of 12 cores primarily at the right mid and right apex. Cystoscopy revealed regrowth of right lobe only appreciated on TRUS, TRUS volume was 70g.   Prostate MRI on 03/23/2021 revealed PI-RADS category 5 lesion of the right peripheral zone of the apex. This has greater than 1.5 cm of capsular contact which can predispose to occult transcapsular spread. Targeting data sent to [place]. Moderate prostatomegaly and benign prostatic hypertrophy.  He is s/p fusion biopsy on 05/02/2021. Surgical pathology was consistent with Gleason 4+4 involving 3 of 12 cores affecting up to 80% primarily in the right apex, and Gleason 4+3 involving 1 of 12 cored affecting 5% primarily in the right  mid.   He reports that he has ED and he is still bleeding from the biopsy. He is on baby aspirin.  The bleeding is decreasing.  He feels like he is emptying his bladder well.  No large clots.  PMH: Past Medical History:  Diagnosis Date   Allergy    Anxiety    Arthritis    Cataract    beginning stage bilateral   Diverticulosis    Elevated PSA    Esophageal stricture    Esophagitis    GERD (gastroesophageal reflux disease)    Gout    History of kidney stones    Hyperlipidemia    Hypertension    Inguinal hernia    Internal hemorrhoids    Prostate cancer ([place])    Tubular adenoma of colon     Surgical History: Past Surgical History:  Procedure Laterality Date   BACK SURGERY  1980's   L4/5   BALLOON DILATION N/A 11/17/2018   Procedure: URETHRAL BALLOON DILATION;  Surgeon: Hollice Espy, MD;  Location: [facility];  Service: Urology;  Laterality: N/A;   CARPAL TUNNEL RELEASE Right 08/15/2016   Procedure: CARPAL TUNNEL RELEASE ENDOSCOPIC;  Surgeon: Corky Mull, MD;  Location: [facility];  Service: Orthopedics;  Laterality: Right;   CATARACT EXTRACTION W/PHACO Right 03/14/2021   Procedure: CATARACT EXTRACTION PHACO AND INTRAOCULAR LENS PLACEMENT (IOC) RIGHT 6.84 00:52.3;  Surgeon: Birder Robson, MD;  Location: [facility];  Service: Ophthalmology;  Laterality: Right;   CATARACT EXTRACTION W/PHACO Left 03/28/2021   Procedure: CATARACT EXTRACTION PHACO AND INTRAOCULAR LENS PLACEMENT (IOC) LEFT;  Surgeon: Birder Robson, MD;  Location: [facility];  Service: Ophthalmology;  Laterality: Left;   COLONOSCOPY     CYSTOSCOPY N/A 11/17/2018   Procedure: CYSTOSCOPY;  Surgeon: Hollice Espy, MD;  Location: [facility];  Service: Urology;  Laterality: N/A;   CYSTOSCOPY WITH LITHOLAPAXY N/A 10/09/2017   Procedure: CYSTOSCOPY WITH LITHOLAPAXY;  Surgeon: Hollice Espy, MD;  Location: [facility];  Service: Urology;  Laterality: N/A;   ESOPHAGOGASTRODUODENOSCOPY  11-21-06    Esophagitis, Stricture/dilated    HOLEP-LASER ENUCLEATION OF THE PROSTATE WITH MORCELLATION N/A 10/09/2017   Procedure: HOLEP-LASER ENUCLEATION OF THE PROSTATE WITH MORCELLATION;  Surgeon: Hollice Espy, MD;  Location: [facility];  Service: Urology;  Laterality: N/A;   LYMPH NODE BIOPSY  1957   benign  lymph node removed   Right arm fracture     TONSILLECTOMY     TOTAL KNEE ARTHROPLASTY  09-02-02   Bilateral   UPPER GASTROINTESTINAL ENDOSCOPY      Home Medications:  Allergies as of 05/10/2021       Reactions   Penicillins Rash   Did it involve swelling of the face/tongue/throat, SOB, or low BP? No Did it involve sudden or severe rash/hives, skin peeling, or any reaction on the inside of your mouth or nose? No Did you need to seek medical attention at a hospital or doctor's office? No When did it last happen?      50 Years If all above answers are NO, may proceed with cephalosporin use.   Acetaminophen Itching   Aspirin Itching   OK w/low dose aspirin         Medication List        Accurate as of May 10, 2021 11:59 PM. If you have any questions, ask your nurse or doctor.          amLODipine 10 MG tablet Commonly known as: NORVASC TAKE 1 TABLET BY MOUTH EVERYDAY AT BEDTIME   aspirin 81 MG tablet Take 81 mg by mouth daily.   BENEFIBER DRINK MIX PO Take by mouth.   [employer] Spectravite Senior Tabs Take 1 tablet by mouth daily.   metoprolol succinate 50 MG 24 hr tablet Commonly known as: TOPROL-XL TAKE 1 TABLET BY MOUTH EVERY DAY   Mitigare 0.6 MG Caps Generic drug: Colchicine TAKE 0.6 MG BY MOUTH DAILY AS NEEDED (GOUT FLARE).   pantoprazole 40 MG tablet Commonly known as: PROTONIX TAKE 1 TABLET BY MOUTH TWICE A DAY   tamsulosin 0.4 MG Caps capsule Commonly known as: FLOMAX Take by mouth.   TART CHERRY ADVANCED PO Take 425 mg by mouth daily.        Allergies:  Allergies  Allergen Reactions   Penicillins Rash    Did it involve swelling of the  face/tongue/throat, SOB, or low BP? No Did it involve sudden or severe rash/hives, skin peeling, or any reaction on the inside of your mouth or nose? No Did you need to seek medical attention at a hospital or doctor's office? No When did it last happen?      50 Years If all above answers are NO, may proceed with cephalosporin use.    Acetaminophen Itching   Aspirin Itching    OK w/low dose aspirin     Family History: Family History  Problem Relation Age of Onset   Ulcers Father    Diverticulitis Father    Arthritis Father    Hypertension Brother    Obesity Brother    Cancer Brother  throat   Arthritis Mother    Depression Mother        Anxiety   Colon cancer Neg Hx    Colon polyps Neg Hx    Esophageal cancer Neg Hx    Rectal cancer Neg Hx    Stomach cancer Neg Hx    Kidney disease Neg Hx    Kidney cancer Neg Hx    Prostate cancer Neg Hx     Social History:  reports that he has never smoked. He has never used smokeless tobacco. He reports that he does not drink alcohol and does not use drugs.   Physical Exam: BP (!) 174/80    Pulse 63    Ht '5\' 9"'$  (1.753 m)    Wt 255 lb (115.7 kg)    BMI 37.66 kg/m   Constitutional:  Alert and oriented, No acute distress. HEENT:  AT, moist mucus membranes.  Trachea midline, no masses. Cardiovascular: No clubbing, cyanosis, or edema. Respiratory: Normal respiratory effort, no increased work of breathing. Skin: No rashes, bruises or suspicious lesions. Neurologic: Grossly intact, no focal deficits, moving all 4 extremities. Psychiatric: Normal mood and affect.  Laboratory Data: Lab Results  Component Value Date   CREATININE 1.42 02/04/2020   Lab Results  Component Value Date   PSA 6.41 (H) 02/04/2020   PSA 5.45 (H) 07/20/2015   PSA 6.58 (H) 07/16/2014   Lab Results  Component Value Date   HGBA1C 5.6 06/26/2006    Assessment & Plan:    Prostate cancer  -Newly diagnosed high risk prostate cancer, cross-sectional  imaging pending for completion of staging as outlined below -He is in overall relatively good health for his age and life expectancy is greater than 10 years, as such, consideration of local therapy is indicated in this clinical situation -We discussed the roles for active surveillance, radiation therapy, surgical therapy, androgen deprivation, as well as ablative therapy options for the treatment of prostate cancer as appropriate to his individual cancer situation. We discussed the risks and benefits of these options with regard to their impact on cancer control and also in terms of potential adverse events, complications, and impact on quality of life particularly related to urinary, bowel, and sexual function. The patient was encouraged to ask questions throughout the discussion today and all questions were answered to his stated satisfaction. In addition, the patient was provided with and/or directed to appropriate resources and literature for further education about prostate cancer treatment options. - Will proceed with Eligard and gold seed after visit with Dr. Baruch Gouty  -We discussed ADT along with all side effects including hot flashes, loss of muscle mass, loss of bone density, increased abdominal fat, libido and energy issues along with longer-term cardiovascular side effects.  He will need to be on this medication 2 to 3 years as long as his PET scan is negative and he elects for radiation. - Will have him undergo staging imaging in the form of a PSMA PET scan  - Referral sent to radiation oncology    [name] as a scribe for Hollice Espy, MD.,have documented all relevant documentation on the behalf of Hollice Espy, MD,as directed by  Hollice Espy, MD while in the presence of Hollice Espy, MD.  I have reviewed the above documentation for accuracy and completeness, and I agree with the above.   Hollice Espy, MD   I spent 43 total minutes on the day of the encounter  including pre-visit review of the medical record, face-to-face time with  the patient, and post visit ordering of labs/imaging/tests.  [place] [address] [place], [place] [address] [phone]

## 2021-05-10 ENCOUNTER — Ambulatory Visit: Payer: Medicare PPO | Admitting: Urology

## 2021-05-10 ENCOUNTER — Other Ambulatory Visit: Payer: Self-pay

## 2021-05-10 VITALS — BP 174/80 | HR 63 | Ht 69.0 in | Wt 255.0 lb

## 2021-05-10 DIAGNOSIS — C61 Malignant neoplasm of prostate: Secondary | ICD-10-CM

## 2021-05-16 DIAGNOSIS — M5136 Other intervertebral disc degeneration, lumbar region: Secondary | ICD-10-CM | POA: Diagnosis not present

## 2021-05-16 DIAGNOSIS — M9903 Segmental and somatic dysfunction of lumbar region: Secondary | ICD-10-CM | POA: Diagnosis not present

## 2021-05-16 DIAGNOSIS — M5033 Other cervical disc degeneration, cervicothoracic region: Secondary | ICD-10-CM | POA: Diagnosis not present

## 2021-05-16 DIAGNOSIS — M9901 Segmental and somatic dysfunction of cervical region: Secondary | ICD-10-CM | POA: Diagnosis not present

## 2021-05-18 ENCOUNTER — Telehealth: Payer: Self-pay

## 2021-05-18 ENCOUNTER — Institutional Professional Consult (permissible substitution): Payer: Medicare PPO | Admitting: Radiation Oncology

## 2021-05-18 ENCOUNTER — Other Ambulatory Visit: Payer: Self-pay

## 2021-05-18 ENCOUNTER — Ambulatory Visit
Admission: RE | Admit: 2021-05-18 | Discharge: 2021-05-18 | Disposition: A | Discharge status: Home / Self Care | Payer: Medicare PPO | Source: Ambulatory Visit | Attending: Radiation Oncology | Admitting: Radiation Oncology

## 2021-05-18 VITALS — BP 153/72 | HR 62 | Temp 97.0°F | Resp 16 | Wt 266.6 lb

## 2021-05-18 DIAGNOSIS — E785 Hyperlipidemia, unspecified: Secondary | ICD-10-CM | POA: Diagnosis not present

## 2021-05-18 DIAGNOSIS — N529 Male erectile dysfunction, unspecified: Secondary | ICD-10-CM | POA: Diagnosis not present

## 2021-05-18 DIAGNOSIS — Z79899 Other long term (current) drug therapy: Secondary | ICD-10-CM | POA: Diagnosis not present

## 2021-05-18 DIAGNOSIS — C61 Malignant neoplasm of prostate: Secondary | ICD-10-CM | POA: Insufficient documentation

## 2021-05-18 DIAGNOSIS — R350 Frequency of micturition: Secondary | ICD-10-CM | POA: Insufficient documentation

## 2021-05-18 DIAGNOSIS — Z86010 Personal history of colonic polyps: Secondary | ICD-10-CM | POA: Diagnosis not present

## 2021-05-18 DIAGNOSIS — I10 Essential (primary) hypertension: Secondary | ICD-10-CM | POA: Diagnosis not present

## 2021-05-18 DIAGNOSIS — M129 Arthropathy, unspecified: Secondary | ICD-10-CM | POA: Insufficient documentation

## 2021-05-18 DIAGNOSIS — R351 Nocturia: Secondary | ICD-10-CM | POA: Insufficient documentation

## 2021-05-18 DIAGNOSIS — Z7982 Long term (current) use of aspirin: Secondary | ICD-10-CM | POA: Insufficient documentation

## 2021-05-18 DIAGNOSIS — K219 Gastro-esophageal reflux disease without esophagitis: Secondary | ICD-10-CM | POA: Insufficient documentation

## 2021-05-18 NOTE — Telephone Encounter (Signed)
No PA required per pt's insurance for Eligard. Called pt to schedule. No answer. LM asking pt to call back for scheduling.  ?

## 2021-05-18 NOTE — Telephone Encounter (Signed)
-----   Message from Alvera Novel, [place] sent at 05/10/2021  2:08 PM EST ----- ?Regarding: eligard ?Dr. Erlene Quan would like to start this patient on Eligard ? ?

## 2021-05-18 NOTE — Consult Note (Signed)
?NEW PATIENT EVALUATION ? ?Name: [name]  ?MRN: [id number]  ?Date:   05/18/2021     ?DOB: 1940-05-28 ? ? ?This 81 y.o. male patient presents to the clinic for initial evaluation of stage IIc (cT2 aN0 M0) Gleason 8 (4+4) adenocarcinoma the prostate presenting with a PSA of.  13.1 ? ?REFERRING PHYSICIAN: Leone Haven, MD ? ?CHIEF COMPLAINT:  ?Chief Complaint  ?Patient presents with  ? Prostate Cancer  ?  Initial consultation  ? ? ?DIAGNOSIS: The encounter diagnosis was Prostate cancer ([place]). ?  ?PREVIOUS INVESTIGATIONS:  ?Prostate MRI reviewed PSMA PET ordered ?Pathology reports reviewed ?Clinical notes reviewed ? ?HPI: Patient is an 81 year old male who developed a rising PSA back in 2017 biopsy at that time of his prostate was negative.  He most recently has climbed to PSA of 13.1.  In 2020 he had a prostate MRI which showed a nodular transition zone with consistent with BPH PI-RADS 1.  Biopsy a year later showed low volume Gleason 6 (3+3) in 2 of 12 cores.  He was on active surveillance and prostate MRI in January 23 showed a PI-RADS category 5 lesion of the right peripheral zone of the apex.  He had 1.5 cm capsular contact which could predispose to occult transcapsular spread.  He underwent fused biopsy in the end of February showing Gleason 8 (4+4) involving 3 of 12 cores and 1 core showing Gleason 7 (4+3).  Patient does have erectile dysfunction also some urinary frequency and nocturia x4.  He is having no bone pain.  He has a PSMA PET scan ordered later this month.  He is now seen for radiation oncology opinion. ? ?PLANNED TREATMENT REGIMEN: Probable image guided IMRT radiation therapy to prostate and pelvic nodes ? ?PAST MEDICAL HISTORY:  has a past medical history of Allergy, Anxiety, Arthritis, Cataract, Diverticulosis, Elevated PSA, Esophageal stricture, Esophagitis, GERD (gastroesophageal reflux disease), Gout, History of kidney stones, Hyperlipidemia, Hypertension, Inguinal hernia, Internal  hemorrhoids, Prostate cancer ([place]), and Tubular adenoma of colon.   ? ?PAST SURGICAL HISTORY:  ?Past Surgical History:  ?Procedure Laterality Date  ? BACK SURGERY  1980's  ? L4/5  ? BALLOON DILATION N/A 11/17/2018  ? Procedure: URETHRAL BALLOON DILATION;  Surgeon: Hollice Espy, MD;  Location: [facility];  Service: Urology;  Laterality: N/A;  ? CARPAL TUNNEL RELEASE Right 08/15/2016  ? Procedure: CARPAL TUNNEL RELEASE ENDOSCOPIC;  Surgeon: Corky Mull, MD;  Location: [place];  Service: Orthopedics;  Laterality: Right;  ? CATARACT EXTRACTION W/PHACO Right 03/14/2021  ? Procedure: CATARACT EXTRACTION PHACO AND INTRAOCULAR LENS PLACEMENT (IOC) RIGHT 6.84 00:52.3;  Surgeon: Birder Robson, MD;  Location: [place];  Service: Ophthalmology;  Laterality: Right;  ? CATARACT EXTRACTION W/PHACO Left 03/28/2021  ? Procedure: CATARACT EXTRACTION PHACO AND INTRAOCULAR LENS PLACEMENT ([place]) LEFT;  Surgeon: Birder Robson, MD;  Location: [place];  Service: Ophthalmology;  Laterality: Left;  ? COLONOSCOPY    ? CYSTOSCOPY N/A 11/17/2018  ? Procedure: CYSTOSCOPY;  Surgeon: Hollice Espy, MD;  Location: [facility];  Service: Urology;  Laterality: N/A;  ? CYSTOSCOPY WITH LITHOLAPAXY N/A 10/09/2017  ? Procedure: CYSTOSCOPY WITH LITHOLAPAXY;  Surgeon: Hollice Espy, MD;  Location: [facility];  Service: Urology;  Laterality: N/A;  ? ESOPHAGOGASTRODUODENOSCOPY  11-21-06  ? Esophagitis, Stricture/dilated   ? HOLEP-LASER ENUCLEATION OF THE PROSTATE WITH MORCELLATION N/A 10/09/2017  ? Procedure: HOLEP-LASER ENUCLEATION OF THE PROSTATE WITH MORCELLATION;  Surgeon: Hollice Espy, MD;  Location: [facility];  Service: Urology;  Laterality:  N/A;  ? LYMPH NODE BIOPSY  1957  ? benign  lymph node removed  ? Right arm fracture    ? TONSILLECTOMY    ? TOTAL KNEE ARTHROPLASTY  09-02-02  ? Bilateral  ? UPPER GASTROINTESTINAL ENDOSCOPY    ? ? ?FAMILY HISTORY: family history includes Arthritis in his father and mother; Cancer  in his brother; Depression in his mother; Diverticulitis in his father; Hypertension in his brother; Obesity in his brother; Ulcers in his father. ? ?SOCIAL HISTORY:  reports that he has never smoked. He has never used smokeless tobacco. He reports that he does not drink alcohol and does not use drugs. ? ?ALLERGIES: Penicillins, Acetaminophen, and Aspirin ? ?MEDICATIONS:  ?Current Outpatient Medications  ?Medication Sig Dispense Refill  ? amLODipine (NORVASC) 10 MG tablet TAKE 1 TABLET BY MOUTH EVERYDAY AT BEDTIME 90 tablet 1  ? aspirin 81 MG tablet Take 81 mg by mouth daily.    ? metoprolol succinate (TOPROL-XL) 50 MG 24 hr tablet TAKE 1 TABLET BY MOUTH EVERY DAY 90 tablet 1  ? Misc Natural Products (TART CHERRY ADVANCED PO) Take 425 mg by mouth daily.    ? MITIGARE 0.6 MG CAPS TAKE 0.6 MG BY MOUTH DAILY AS NEEDED (GOUT FLARE). 90 capsule 1  ? Multiple Vitamins-Minerals ([employer] SPECTRAVITE SENIOR) TABS Take 1 tablet by mouth daily.     ? pantoprazole (PROTONIX) 40 MG tablet TAKE 1 TABLET BY MOUTH TWICE A DAY 180 tablet 3  ? tamsulosin (FLOMAX) 0.4 MG CAPS capsule Take by mouth.    ? Wheat Dextrin (BENEFIBER DRINK MIX PO) Take by mouth.    ? ?Current Facility-Administered Medications  ?Medication Dose Route Frequency Provider Last Rate Last Admin  ? 0.9 %  sodium chloride infusion  500 mL Intravenous Once Armbruster, Carlota Raspberry, MD      ? ? ?ECOG PERFORMANCE STATUS:  1 - Symptomatic but completely ambulatory ? ?REVIEW OF SYSTEMS: ?Patient denies any weight loss, fatigue, weakness, fever, chills or night sweats. Patient denies any loss of vision, blurred vision. Patient denies any ringing  of the ears or hearing loss. No irregular heartbeat. Patient denies heart murmur or history of fainting. Patient denies any chest pain or pain radiating to her upper extremities. Patient denies any shortness of breath, difficulty breathing at night, cough or hemoptysis. Patient denies any swelling in the lower legs. Patient denies any  nausea vomiting, vomiting of blood, or coffee ground material in the vomitus. Patient denies any stomach pain. Patient states has had normal bowel movements no significant constipation or diarrhea. Patient denies any dysuria, hematuria or significant nocturia. Patient denies any problems walking, swelling in the joints or loss of balance. Patient denies any skin changes, loss of hair or loss of weight. Patient denies any excessive worrying or anxiety or significant depression. Patient denies any problems with insomnia. Patient denies excessive thirst, polyuria, polydipsia. Patient denies any swollen glands, patient denies easy bruising or easy bleeding. Patient denies any recent infections, allergies or URI. Patient "s visual fields have not changed significantly in recent time. ?  ?PHYSICAL EXAM: ?BP (!) 153/72 (BP Location: Right Arm, Patient Position: Sitting)   Pulse 62   Temp (!) 97 ?F (36.1 ?C) (Tympanic)   Resp 16   Wt 266 lb 9.6 oz (120.9 kg)   BMI 39.37 kg/m?  ?Slightly obese male in NAD.  Well-developed well-nourished patient in NAD. HEENT reveals PERLA, EOMI, discs not visualized.  Oral cavity is clear. No oral mucosal lesions are identified.  Neck is clear without evidence of cervical or supraclavicular adenopathy. Lungs are clear to A&P. Cardiac examination is essentially unremarkable with regular rate and rhythm without murmur rub or thrill. Abdomen is benign with no organomegaly or masses noted. Motor sensory and DTR levels are equal and symmetric in the upper and lower extremities. Cranial nerves II through XII are grossly intact. Proprioception is intact. No peripheral adenopathy or edema is identified. No motor or sensory levels are noted. Crude visual fields are within normal range. ? ?LABORATORY DATA: Pathology reports reviewed ? ?  ?RADIOLOGY RESULTS: MRI scan reviewed PSMA PET to be reviewed prior to simulation ? ? ?IMPRESSION: Stage IIc adenocarcinoma the prostate in 81 year old  male ? ?PLAN: At this time elect reviewed the PSMA PET prior to initiating treatment.  I have run the [facility] nomogram showing a 26% chance of pelvic lymph node involvement as well as close to an 80% ch

## 2021-05-29 ENCOUNTER — Encounter (HOSPITAL_COMMUNITY): Payer: Medicare PPO

## 2021-05-31 ENCOUNTER — Ambulatory Visit: Payer: Medicare PPO

## 2021-06-02 ENCOUNTER — Ambulatory Visit (HOSPITAL_COMMUNITY)
Admission: RE | Admit: 2021-06-02 | Discharge: 2021-06-02 | Disposition: A | Discharge status: Home / Self Care | Payer: Medicare PPO | Source: Ambulatory Visit | Attending: Urology | Admitting: Urology

## 2021-06-02 DIAGNOSIS — C61 Malignant neoplasm of prostate: Secondary | ICD-10-CM | POA: Insufficient documentation

## 2021-06-02 MED ORDER — PIFLIFOLASTAT F 18 (PYLARIFY) INJECTION
9.0000 | Freq: Once | INTRAVENOUS | Status: AC
  Administered 2021-06-02: 7.55 via INTRAVENOUS

## 2021-06-03 ENCOUNTER — Other Ambulatory Visit: Payer: Self-pay

## 2021-06-03 ENCOUNTER — Emergency Department: Payer: Medicare PPO

## 2021-06-03 ENCOUNTER — Ambulatory Visit
Admission: EM | Admit: 2021-06-03 | Discharge: 2021-06-03 | Disposition: A | Discharge status: Home / Self Care | Payer: Medicare PPO

## 2021-06-03 ENCOUNTER — Emergency Department
Admission: EM | Admit: 2021-06-03 | Discharge: 2021-06-03 | Disposition: A | Discharge status: Home / Self Care | Payer: Medicare PPO | Attending: Emergency Medicine | Admitting: Emergency Medicine

## 2021-06-03 ENCOUNTER — Encounter: Payer: Self-pay | Admitting: Intensive Care

## 2021-06-03 DIAGNOSIS — K4090 Unilateral inguinal hernia, without obstruction or gangrene, not specified as recurrent: Secondary | ICD-10-CM | POA: Diagnosis not present

## 2021-06-03 DIAGNOSIS — N179 Acute kidney failure, unspecified: Secondary | ICD-10-CM | POA: Diagnosis not present

## 2021-06-03 DIAGNOSIS — R109 Unspecified abdominal pain: Secondary | ICD-10-CM | POA: Diagnosis present

## 2021-06-03 DIAGNOSIS — R319 Hematuria, unspecified: Secondary | ICD-10-CM | POA: Diagnosis not present

## 2021-06-03 DIAGNOSIS — I10 Essential (primary) hypertension: Secondary | ICD-10-CM | POA: Diagnosis not present

## 2021-06-03 DIAGNOSIS — R110 Nausea: Secondary | ICD-10-CM | POA: Diagnosis not present

## 2021-06-03 DIAGNOSIS — D72829 Elevated white blood cell count, unspecified: Secondary | ICD-10-CM | POA: Diagnosis not present

## 2021-06-03 DIAGNOSIS — R1084 Generalized abdominal pain: Secondary | ICD-10-CM | POA: Diagnosis not present

## 2021-06-03 DIAGNOSIS — K5900 Constipation, unspecified: Secondary | ICD-10-CM | POA: Insufficient documentation

## 2021-06-03 DIAGNOSIS — K5730 Diverticulosis of large intestine without perforation or abscess without bleeding: Secondary | ICD-10-CM | POA: Diagnosis not present

## 2021-06-03 DIAGNOSIS — N401 Enlarged prostate with lower urinary tract symptoms: Secondary | ICD-10-CM | POA: Diagnosis not present

## 2021-06-03 HISTORY — DX: Acute pancreatitis without necrosis or infection, unspecified: K85.90

## 2021-06-03 LAB — CBC
HCT: 43.6 % (ref 39.0–52.0)
Hemoglobin: 14.5 g/dL (ref 13.0–17.0)
MCH: 29.4 pg (ref 26.0–34.0)
MCHC: 33.3 g/dL (ref 30.0–36.0)
MCV: 88.4 fL (ref 80.0–100.0)
Platelets: 226 10*3/uL (ref 150–400)
RBC: 4.93 MIL/uL (ref 4.22–5.81)
RDW: 13.3 % (ref 11.5–15.5)
WBC: 14.6 10*3/uL — ABNORMAL HIGH (ref 4.0–10.5)
nRBC: 0 % (ref 0.0–0.2)

## 2021-06-03 LAB — COMPREHENSIVE METABOLIC PANEL
ALT: 37 U/L (ref 0–44)
AST: 38 U/L (ref 15–41)
Albumin: 4.4 g/dL (ref 3.5–5.0)
Alkaline Phosphatase: 77 U/L (ref 38–126)
Anion gap: 10 (ref 5–15)
BUN: 15 mg/dL (ref 8–23)
CO2: 26 mmol/L (ref 22–32)
Calcium: 9.2 mg/dL (ref 8.9–10.3)
Chloride: 100 mmol/L (ref 98–111)
Creatinine, Ser: 1.08 mg/dL (ref 0.61–1.24)
GFR, Estimated: >60 mL/min (ref >60)
Glucose, Bld: 120 mg/dL — ABNORMAL HIGH (ref 70–99)
Potassium: 4.1 mmol/L (ref 3.5–5.1)
Sodium: 136 mmol/L (ref 135–145)
Total Bilirubin: 0.9 mg/dL (ref 0.3–1.2)
Total Protein: 8.4 g/dL — ABNORMAL HIGH (ref 6.5–8.1)

## 2021-06-03 LAB — URINALYSIS, COMPLETE (UACMP) WITH MICROSCOPIC
Bacteria, UA: NONE SEEN
Bilirubin Urine: NEGATIVE
Glucose, UA: NEGATIVE mg/dL
Ketones, ur: NEGATIVE mg/dL
Leukocytes,Ua: NEGATIVE
Nitrite: NEGATIVE
Protein, ur: 100 mg/dL — AB
RBC / HPF: >50 RBC/hpf — ABNORMAL HIGH (ref 0–5)
Specific Gravity, Urine: 1.016 (ref 1.005–1.030)
pH: 7 (ref 5.0–8.0)

## 2021-06-03 LAB — LIPASE, BLOOD: Lipase: 30 U/L (ref 11–51)

## 2021-06-03 MED ORDER — LACTULOSE 20 G PO PACK
20.0000 g | PACK | Freq: Two times a day (BID) | ORAL | 0 refills | Status: AC
Start: 2021-06-03 — End: 2021-06-06

## 2021-06-03 MED ORDER — ONDANSETRON HCL 4 MG/2ML IJ SOLN
4.0000 mg | INTRAMUSCULAR | Status: AC
  Administered 2021-06-03: 4 mg via INTRAVENOUS
  Filled 2021-06-03: qty 2

## 2021-06-03 MED ORDER — MORPHINE SULFATE (PF) 4 MG/ML IV SOLN
4.0000 mg | Freq: Once | INTRAVENOUS | Status: AC
  Administered 2021-06-03: 4 mg via INTRAVENOUS
  Filled 2021-06-03: qty 1

## 2021-06-03 MED ORDER — IOHEXOL 300 MG/ML  SOLN
100.0000 mL | Freq: Once | INTRAMUSCULAR | Status: AC | PRN
  Administered 2021-06-03: 100 mL via INTRAVENOUS

## 2021-06-03 NOTE — ED Notes (Signed)
Soap Suds Enema instilled into this pt's rectum at this time. Nursing student at bedside with pt to assist pt get to [facility] and back into bed when pt feels they can use the bathroom.  ?

## 2021-06-03 NOTE — ED Triage Notes (Addendum)
Presents with lower abdominal pain with nausea since 0300 today. Also c/o lower back pain. Patient also reports more frequent urination. HX pancreatitis and prostate cancer  ?

## 2021-06-03 NOTE — ED Provider Notes (Signed)
Patient with a complex medical history including advanced prostate cancer, history of rectal bleeding came in today to [facility] was seen in triage for acute abdominal pain.  He is concerned that he may have pancreatitis.  Patient was advised to go immediately to the emergency department for further work-up and evaluation given the limitations here at urgent care and inability to get stat labs. ?  ?Scot Jun, FNP ?06/03/21 1241 ? ?

## 2021-06-03 NOTE — ED Provider Notes (Signed)
Patient unfortunately without great output after two enema attempts. Discussed with patient. At this time do think further attempts at enema without clear benefit, although did discuss we could try different formula. Patient states that he has not had good success with enema's in the past. At this time will plan on giving patient prescription for lactulose. Patient does have miralax at home and instructed patient to continue that for maintenance after the lactulose.  ?  ?Nance Pear, MD ?06/03/21 1724 ? ?

## 2021-06-03 NOTE — ED Provider Notes (Signed)
? ?[facility] ?Provider Note ? ? ? Event Date/Time  ? First MD Initiated Contact with Patient 06/03/21 1312   ?  (approximate) ? ? ?History  ? ?Abdominal Pain ? ? ?HPI ? ?[name] is a 81 y.o. male who upon my review of patient's outpatient note from March 8 has a history of prostate cancer, recurrent gross hematuria, urethral stricture, and BPH.  Also noted is a history of diverticulosis esophagitis kidney stones prior pancreatitis ? ?About midnight last night he started noticed that he was felt like his stomach felt bloated or constipated.  He has had these kind of symptoms off-and-on he reports for the last couple months and usually he will have a bowel movement and feels better.  But today he reports he feels like his abdomen is just slightly swollen feels very full or stretched, and having some mild pain across the mid abdomen.  Seems to cause a little bit discomfort towards his lower back as well.  No urinary symptoms, urinating normally no noted blood in his urine no pain or burning.  Currently undergoing treatment and evaluation for prostate cancer supposed to start radiation therapy soon ? ?He does have a history of pancreatitis about 10 years ago he reports that fullness seems familiar but does not necessarily feel exactly like pancreatitis either ? ?Slight nausea but no vomiting.  Did not eat breakfast this morning just no appetite ? ?No fevers or chills.  No chest pain no trouble breathing.  Walking about without difficulty no lightheadedness ? ?  ? ? ?Physical Exam  ? ?Triage Vital Signs: ?ED Triage Vitals  ?Enc Vitals Group  ?   BP 06/03/21 1302 (!) 182/84  ?   Pulse Rate 06/03/21 1302 96  ?   Resp 06/03/21 1302 16  ?   Temp 06/03/21 1302 98.7 ?F (37.1 ?C)  ?   Temp Source 06/03/21 1302 Oral  ?   SpO2 06/03/21 1302 99 %  ?   Weight 06/03/21 1259 266 lb (120.7 kg)  ?   Height 06/03/21 1259 '5\' 8"'$  (1.727 m)  ?   Head Circumference --   ?   Peak Flow --   ?   Pain Score 06/03/21  1259 7  ?   Pain Loc --   ?   Pain Edu? --   ?   Excl. in [place]? --   ? ? ?Most recent vital signs: ?Vitals:  ? 06/03/21 1430 06/03/21 1500  ?BP: (!) 178/71 (!) 171/81  ?Pulse: 81 78  ?Resp: 20 18  ?Temp:    ?SpO2:  96%  ? ? ? ?General: Awake, no distress.  Very pleasant.  Accompanied by his wife ?CV:  Good peripheral perfusion.  Normal heart tones ?Resp:  Normal effort.  Clear lung sounds ?Abd:  Abdomen seems protuberant but not rigidly distended.  No noted tympany.  No peritonitis to percussion in any quadrant.  Abdominal exam the abdomen is soft though it seems just slightly protuberant, and he reports tenderness primarily in the mid and also some in the right mid abdomen.  No rebound or guarding.  No obvious frank or acute abdomen.  No pain on the left lower quadrant suprapubic region left upper quadrant or right upper quadrant/costal margin ?Other:  No lower extremity edema ? ? ?ED Results / Procedures / Treatments  ? ?Labs ?(all labs ordered are listed, but only abnormal results are displayed) ?Labs Reviewed  ?CBC - Abnormal; Notable for the following components:  ?  Result Value  ? WBC 14.6 (*)   ? All other components within normal limits  ?COMPREHENSIVE METABOLIC PANEL - Abnormal; Notable for the following components:  ? Glucose, Bld 120 (*)   ? Total Protein 8.4 (*)   ? All other components within normal limits  ?URINALYSIS, COMPLETE (UACMP) WITH MICROSCOPIC - Abnormal; Notable for the following components:  ? Color, Urine YELLOW (*)   ? APPearance CLEAR (*)   ? Hgb urine dipstick SMALL (*)   ? Protein, ur 100 (*)   ? RBC / HPF >50 (*)   ? All other components within normal limits  ?LIPASE, BLOOD  ? ? ? ?EKG ? ?Reviewed inter by me at 1345 ?Heart rate 90 ?QRS 80 ?QTc 440 ?Normal sinus rhythm, no evidence of acute ischemia or ectopy ? ? ?RADIOLOGY ? ?Personally viewed and interpreted the patient's CT abdomen pelvis for acute gross pathology.  He has notable diverticulosis.  Additionally appears to have  notable stool in the right side of the colon. ? ?Radiologist report and more detailed studies reviewed as below ?IMPRESSION: ?1. Marked colonic diverticulosis without evidence of acute ?diverticulitis. ?  ?2. Focal distended cecum with retained stool, without evidence of ?obstruction or colitis. ?  ?3.  Enlarged prostate. ?  ?4.  No CT evidence of acute abdominal/pelvic process. ? ? ? ? ? ?PROCEDURES: ? ?Critical Care performed: No ? ?Procedures ? ? ?MEDICATIONS ORDERED IN ED: ?Medications  ?morphine (PF) 4 MG/ML injection 4 mg (4 mg Intravenous Given 06/03/21 1344)  ?ondansetron (ZOFRAN) injection 4 mg (4 mg Intravenous Given 06/03/21 1342)  ?iohexol (OMNIPAQUE) 300 MG/ML solution 100 mL (100 mLs Intravenous Contrast Given 06/03/21 1412)  ? ? ? ?IMPRESSION / MDM / ASSESSMENT AND PLAN / ED COURSE  ?I reviewed the triage vital signs and the nursing notes. ?             ?               ? ?Differential diagnosis includes but is not limited to, abdominal perforation, aortic dissection, cholecystitis, appendicitis, diverticulitis, colitis, esophagitis/gastritis, kidney stone, pyelonephritis, urinary tract infection, aortic aneurysm. All are considered in decision and treatment plan. Based upon the patient's presentation and risk factors, and given his symptomatology we will proceed with obtaining labs and CT imaging to further evaluate.  His symptoms sound somewhat like constipation or possibly obstructive like findings but he also reports pain in the mid right abdomen also entertain other acute intra-abdominal pathologies. ? ?No associated acute cardiac pulmonary neurologic or vascular symptoms. ? ?The patient is on the cardiac monitor to evaluate for evidence of arrhythmia and/or significant heart rate changes. ? ? ?Clinical Course as of 06/03/21 1519  ?Sat Jun 03, 2021  ?1430 Labs reviewed notable for leukocytosis.  Metabolic panel unremarkable and urinalysis with some hematuria, but patient has a known history of same  currently under care for bladder cancer.  No evidence of acute urinary tract infection [MQ]  ?  ?Clinical Course User Index ?[MQ] Delman Kitten, MD  ? ?Labs were interpreted by me, CBC has resulted and notable for leukocytosis ? ?Comprehensive metabolic panel unremarkable. ? ?----------------------------------------- ?3:18 PM on 06/03/2021 ?----------------------------------------- ?CT negative for acute infectious etiology.  Given the patient's presentation and exam I suspect likely his symptoms are in fact due to significant constipation likely involving the cecum.  There is no clear evidence of infection.  He does have leukocytosis but no fever. ? ?Discussed with the patient treatment options including home  treatment for constipation for further treatment such as enema.  Patient would like to try soapsuds enema here, and is taking MiraLAX once daily at home but plans to advance to twice daily unless it is causing overly loose stools.  We will trial soapsuds enema, if improved thereafter would anticipate discharge home likely presentation secondary to constipation. ? ?Ongoing care assigned to Dr. Archie Balboa, follow-up on results and reevaluation after enema. ? ?FINAL CLINICAL IMPRESSION(S) / ED DIAGNOSES  ? ?Final diagnoses:  ?Constipation, unspecified constipation type  ? ? ? ?Rx / DC Orders  ? ?ED Discharge Orders   ? ? None  ? ?  ? ? ? ?Note:  This document was prepared using Dragon voice recognition software and may include unintentional dictation errors. ?  Delman Kitten, MD ?06/03/21 1519 ? ?

## 2021-06-05 ENCOUNTER — Telehealth: Payer: Self-pay | Admitting: Internal Medicine

## 2021-06-05 ENCOUNTER — Emergency Department: Payer: Medicare PPO

## 2021-06-05 ENCOUNTER — Emergency Department
Admission: EM | Admit: 2021-06-05 | Discharge: 2021-06-05 | Disposition: A | Discharge status: Home / Self Care | Payer: Medicare PPO | Attending: Emergency Medicine | Admitting: Emergency Medicine

## 2021-06-05 ENCOUNTER — Other Ambulatory Visit: Payer: Self-pay

## 2021-06-05 DIAGNOSIS — Z85828 Personal history of other malignant neoplasm of skin: Secondary | ICD-10-CM | POA: Diagnosis not present

## 2021-06-05 DIAGNOSIS — Z8616 Personal history of COVID-19: Secondary | ICD-10-CM | POA: Diagnosis not present

## 2021-06-05 DIAGNOSIS — N179 Acute kidney failure, unspecified: Secondary | ICD-10-CM | POA: Insufficient documentation

## 2021-06-05 DIAGNOSIS — Z96653 Presence of artificial knee joint, bilateral: Secondary | ICD-10-CM | POA: Diagnosis not present

## 2021-06-05 DIAGNOSIS — Z79899 Other long term (current) drug therapy: Secondary | ICD-10-CM | POA: Insufficient documentation

## 2021-06-05 DIAGNOSIS — K5730 Diverticulosis of large intestine without perforation or abscess without bleeding: Secondary | ICD-10-CM | POA: Diagnosis not present

## 2021-06-05 DIAGNOSIS — Z8546 Personal history of malignant neoplasm of prostate: Secondary | ICD-10-CM | POA: Insufficient documentation

## 2021-06-05 DIAGNOSIS — K5900 Constipation, unspecified: Secondary | ICD-10-CM | POA: Diagnosis not present

## 2021-06-05 DIAGNOSIS — Z7982 Long term (current) use of aspirin: Secondary | ICD-10-CM | POA: Insufficient documentation

## 2021-06-05 DIAGNOSIS — R1084 Generalized abdominal pain: Secondary | ICD-10-CM | POA: Diagnosis not present

## 2021-06-05 DIAGNOSIS — J9811 Atelectasis: Secondary | ICD-10-CM | POA: Diagnosis not present

## 2021-06-05 DIAGNOSIS — I10 Essential (primary) hypertension: Secondary | ICD-10-CM | POA: Diagnosis not present

## 2021-06-05 DIAGNOSIS — R109 Unspecified abdominal pain: Secondary | ICD-10-CM | POA: Diagnosis not present

## 2021-06-05 DIAGNOSIS — I878 Other specified disorders of veins: Secondary | ICD-10-CM | POA: Diagnosis not present

## 2021-06-05 DIAGNOSIS — K760 Fatty (change of) liver, not elsewhere classified: Secondary | ICD-10-CM | POA: Diagnosis not present

## 2021-06-05 DIAGNOSIS — Q433 Congenital malformations of intestinal fixation: Secondary | ICD-10-CM | POA: Diagnosis not present

## 2021-06-05 LAB — URINALYSIS, ROUTINE W REFLEX MICROSCOPIC
Bilirubin Urine: NEGATIVE
Glucose, UA: NEGATIVE mg/dL
Ketones, ur: NEGATIVE mg/dL
Leukocytes,Ua: NEGATIVE
Nitrite: NEGATIVE
Protein, ur: 30 mg/dL — AB
RBC / HPF: >50 RBC/hpf — ABNORMAL HIGH (ref 0–5)
Specific Gravity, Urine: 1.013 (ref 1.005–1.030)
Squamous Epithelial / HPF: NONE SEEN (ref 0–5)
pH: 5 (ref 5.0–8.0)

## 2021-06-05 LAB — CBC
HCT: 43.8 % (ref 39.0–52.0)
Hemoglobin: 14.3 g/dL (ref 13.0–17.0)
MCH: 28.8 pg (ref 26.0–34.0)
MCHC: 32.6 g/dL (ref 30.0–36.0)
MCV: 88.1 fL (ref 80.0–100.0)
Platelets: 240 10*3/uL (ref 150–400)
RBC: 4.97 MIL/uL (ref 4.22–5.81)
RDW: 13.2 % (ref 11.5–15.5)
WBC: 12 10*3/uL — ABNORMAL HIGH (ref 4.0–10.5)
nRBC: 0 % (ref 0.0–0.2)

## 2021-06-05 LAB — COMPREHENSIVE METABOLIC PANEL
ALT: 42 U/L (ref 0–44)
AST: 40 U/L (ref 15–41)
Albumin: 4.3 g/dL (ref 3.5–5.0)
Alkaline Phosphatase: 79 U/L (ref 38–126)
Anion gap: 8 (ref 5–15)
BUN: 21 mg/dL (ref 8–23)
CO2: 26 mmol/L (ref 22–32)
Calcium: 8.9 mg/dL (ref 8.9–10.3)
Chloride: 100 mmol/L (ref 98–111)
Creatinine, Ser: 1.52 mg/dL — ABNORMAL HIGH (ref 0.61–1.24)
GFR, Estimated: 46 mL/min — ABNORMAL LOW (ref >60)
Glucose, Bld: 131 mg/dL — ABNORMAL HIGH (ref 70–99)
Potassium: 4 mmol/L (ref 3.5–5.1)
Sodium: 134 mmol/L — ABNORMAL LOW (ref 135–145)
Total Bilirubin: 0.9 mg/dL (ref 0.3–1.2)
Total Protein: 8.9 g/dL — ABNORMAL HIGH (ref 6.5–8.1)

## 2021-06-05 LAB — TROPONIN I (HIGH SENSITIVITY): Troponin I (High Sensitivity): 7 ng/L (ref <18)

## 2021-06-05 LAB — LIPASE, BLOOD: Lipase: 36 U/L (ref 11–51)

## 2021-06-05 MED ORDER — OXYCODONE HCL 5 MG PO TABS
5.0000 mg | ORAL_TABLET | Freq: Once | ORAL | Status: AC
  Administered 2021-06-05: 5 mg via ORAL
  Filled 2021-06-05: qty 1

## 2021-06-05 MED ORDER — SODIUM CHLORIDE 0.9 % IV BOLUS
1000.0000 mL | Freq: Once | INTRAVENOUS | Status: AC
  Administered 2021-06-05: 1000 mL via INTRAVENOUS

## 2021-06-05 MED ORDER — MORPHINE SULFATE (PF) 4 MG/ML IV SOLN
4.0000 mg | Freq: Once | INTRAVENOUS | Status: AC
  Administered 2021-06-05: 4 mg via INTRAVENOUS
  Filled 2021-06-05: qty 1

## 2021-06-05 MED ORDER — DIAZEPAM 5 MG/ML IJ SOLN
1.0000 mg | Freq: Once | INTRAMUSCULAR | Status: AC
  Administered 2021-06-05: 1 mg via INTRAVENOUS
  Filled 2021-06-05: qty 2

## 2021-06-05 MED ORDER — OXYCODONE HCL 5 MG PO TABS: 5.0000 mg | ORAL_TABLET | Freq: Three times a day (TID) | ORAL | 0 refills | Status: DC | PRN

## 2021-06-05 MED ORDER — ONDANSETRON HCL 4 MG/2ML IJ SOLN
4.0000 mg | Freq: Once | INTRAMUSCULAR | Status: AC
  Administered 2021-06-05: 4 mg via INTRAVENOUS
  Filled 2021-06-05: qty 2

## 2021-06-05 MED ORDER — IOHEXOL 300 MG/ML  SOLN
100.0000 mL | Freq: Once | INTRAMUSCULAR | Status: AC | PRN
  Administered 2021-06-05: 100 mL via INTRAVENOUS

## 2021-06-05 MED ORDER — DOCUSATE SODIUM 100 MG PO CAPS: 100.0000 mg | ORAL_CAPSULE | Freq: Two times a day (BID) | ORAL | 2 refills | Status: DC

## 2021-06-05 NOTE — ED Triage Notes (Signed)
Abd pain and distention. Seen Saturday night for same. D/c home with medications. Reports several bowel movements, but now has diarrhea.  ?

## 2021-06-05 NOTE — Telephone Encounter (Signed)
Left message on machine to call back  ? ?Tried to reach pt to get an update on his condition. No answer will also send a My chart message since he sent the original message via My Chart ?

## 2021-06-05 NOTE — ED Notes (Signed)
Pt's pulse ox had dropped back down to 88, 90 percent so the pt was placed back 2 liters of O2 via nasal cannula.  ?

## 2021-06-05 NOTE — ED Provider Notes (Signed)
? ?[facility] ?Provider Note ? ? ? Event Date/Time  ? First MD Initiated Contact with Patient 06/05/21 [phone]   ?  (approximate) ? ? ?History  ? ?Abdominal Pain ? ? ?HPI ? ?[name] is a 81 y.o. male who presents to the ED from home with a chief complaint of abdominal pain and distention.  Patient was seen in the ED 06/03/2021 for same with unremarkable CT scan other than moderate stool burden.  He was given 2 enemas without success and discharged home on lactulose.  Patient reports since that time he has had several bowel movements which were initially firm, now loose.  He was feeling better until he started eating last night and his abdomen began to distend again.  Denies fever, cough, chest pain, shortness of breath, nausea, vomiting, testicular pain/swelling or urinary retention.  Denies anticoagulant use. ?  ? ? ?Past Medical History  ? ?Past Medical History:  ?Diagnosis Date  ? Allergy   ? Anxiety   ? Arthritis   ? Cataract   ? beginning stage bilateral  ? Diverticulosis   ? Elevated PSA   ? Esophageal stricture   ? Esophagitis   ? GERD (gastroesophageal reflux disease)   ? Gout   ? History of kidney stones   ? Hyperlipidemia   ? Hypertension   ? Inguinal hernia   ? Internal hemorrhoids   ? Pancreatitis   ? Prostate cancer ([place])   ? Tubular adenoma of colon   ? ? ? ?Active Problem List  ? ?Patient Active Problem List  ? Diagnosis Date Noted  ? Constipation 02/17/2021  ? History of COVID-19 02/17/2021  ? Basal cell carcinoma 02/17/2021  ? Bilateral lower extremity edema 02/17/2021  ? Decreased energy 08/16/2020  ? Rectal bleeding 02/04/2020  ? Allergic rhinitis 02/11/2019  ? Gross hematuria 08/06/2017  ? Bladder calculi 08/06/2017  ? Rotator cuff impingement syndrome, left 07/18/2016  ? Carpal tunnel syndrome 04/26/2016  ? Frequent urination 04/26/2016  ? GERD (gastroesophageal reflux disease) 01/09/2016  ? Obesity 01/09/2016  ? Renal stone 07/16/2014  ? Gout 06/23/2012  ? Prostate  cancer ([place]) 07/06/2010  ? Hyperlipidemia 08/19/2006  ? Essential hypertension 08/19/2006  ? ? ? ?Past Surgical History  ? ?Past Surgical History:  ?Procedure Laterality Date  ? BACK SURGERY  1980's  ? L4/5  ? BALLOON DILATION N/A 11/17/2018  ? Procedure: URETHRAL BALLOON DILATION;  Surgeon: Hollice Espy, MD;  Location: [facility];  Service: Urology;  Laterality: N/A;  ? CARPAL TUNNEL RELEASE Right 08/15/2016  ? Procedure: CARPAL TUNNEL RELEASE ENDOSCOPIC;  Surgeon: Corky Mull, MD;  Location: [place];  Service: Orthopedics;  Laterality: Right;  ? CATARACT EXTRACTION W/PHACO Right 03/14/2021  ? Procedure: CATARACT EXTRACTION PHACO AND INTRAOCULAR LENS PLACEMENT (I[facility]) RIGHT 6.84 00:52.3;  Surgeon: Birder Robson, MD;  Location: [place];  Service: Ophthalmology;  Laterality: Right;  ? CATARACT EXTRACTION W/PHACO Left 03/28/2021  ? Procedure: CATARACT EXTRACTION PHACO AND INTRAOCULAR LENS PLACEMENT ([place]) LEFT;  Surgeon: Birder Robson, MD;  Location: [place];  Service: Ophthalmology;  Laterality: Left;  ? COLONOSCOPY    ? CYSTOSCOPY N/A 11/17/2018  ? Procedure: CYSTOSCOPY;  Surgeon: Hollice Espy, MD;  Location: [facility];  Service: Urology;  Laterality: N/A;  ? CYSTOSCOPY WITH LITHOLAPAXY N/A 10/09/2017  ? Procedure: CYSTOSCOPY WITH LITHOLAPAXY;  Surgeon: Hollice Espy, MD;  Location: [facility];  Service: Urology;  Laterality: N/A;  ? ESOPHAGOGASTRODUODENOSCOPY  11-21-06  ? Esophagitis, Stricture/dilated   ?  HOLEP-LASER ENUCLEATION OF THE PROSTATE WITH MORCELLATION N/A 10/09/2017  ? Procedure: HOLEP-LASER ENUCLEATION OF THE PROSTATE WITH MORCELLATION;  Surgeon: Hollice Espy, MD;  Location: [facility];  Service: Urology;  Laterality: N/A;  ? LYMPH NODE BIOPSY  1957  ? benign  lymph node removed  ? Right arm fracture    ? TONSILLECTOMY    ? TOTAL KNEE ARTHROPLASTY  09-02-02  ? Bilateral  ? UPPER GASTROINTESTINAL ENDOSCOPY    ? ? ? ?Home Medications  ? ?Prior to Admission medications    ?Medication Sig Start Date End Date Taking? Authorizing Provider  ?amLODipine (NORVASC) 10 MG tablet TAKE 1 TABLET BY MOUTH EVERYDAY AT BEDTIME 04/10/21   Leone Haven, MD  ?aspirin 81 MG tablet Take 81 mg by mouth daily.    [provider]  ?lactulose (CEPHULAC) 20 g packet Take 1 packet (20 g total) by mouth 2 (two) times daily for 3 days. 06/03/21 06/06/21  Nance Pear, MD  ?metoprolol succinate (TOPROL-XL) 50 MG 24 hr tablet TAKE 1 TABLET BY MOUTH EVERY DAY 03/01/21   Leone Haven, MD  ?Misc Natural Products (TART CHERRY ADVANCED PO) Take 425 mg by mouth daily.    [provider]  ?MITIGARE 0.6 MG CAPS TAKE 0.6 MG BY MOUTH DAILY AS NEEDED (GOUT FLARE). 04/12/20   Leone Haven, MD  ?Multiple Vitamins-Minerals ([employer] SPECTRAVITE SENIOR) TABS Take 1 tablet by mouth daily.     [provider]  ?pantoprazole (PROTONIX) 40 MG tablet TAKE 1 TABLET BY MOUTH TWICE A DAY 08/02/20   Leone Haven, MD  ?tamsulosin (FLOMAX) 0.4 MG CAPS capsule Take by mouth. 04/11/21   [provider]  ?Wheat Dextrin (BENEFIBER DRINK MIX PO) Take by mouth.    [provider]  ? ? ? ?Allergies  ?Penicillins, Acetaminophen, and Aspirin ? ? ?Family History  ? ?Family History  ?Problem Relation Age of Onset  ? Ulcers Father   ? Diverticulitis Father   ? Arthritis Father   ? Hypertension Brother   ? Obesity Brother   ? Cancer Brother   ?     throat  ? Arthritis Mother   ? Depression Mother   ?     Anxiety  ? Colon cancer Neg Hx   ? Colon polyps Neg Hx   ? Esophageal cancer Neg Hx   ? Rectal cancer Neg Hx   ? Stomach cancer Neg Hx   ? Kidney disease Neg Hx   ? Kidney cancer Neg Hx   ? Prostate cancer Neg Hx   ? ? ? ?Physical Exam  ?Triage Vital Signs: ?ED Triage Vitals  ?Enc Vitals Group  ?   BP 06/05/21 0302 (!) 150/89  ?   Pulse Rate 06/05/21 0302 96  ?   Resp 06/05/21 0302 18  ?   Temp 06/05/21 0302 98.2 ?F (36.8 ?C)  ?   Temp Source 06/05/21 0302 Oral  ?   SpO2 06/05/21 0302 95 %  ?    Weight 06/05/21 0303 265 lb (120.2 kg)  ?   Height 06/05/21 0303 '5\' 8"'$  (1.727 m)  ?   Head Circumference --   ?   Peak Flow --   ?   Pain Score 06/05/21 0303 8  ?   Pain Loc --   ?   Pain Edu? --   ?   Excl. in [place]? --   ? ? ?Updated Vital Signs: ?BP (!) 156/73   Pulse 76   Temp 98.2 ?  F (36.8 ?C) (Oral)   Resp 14   Ht '5\' 8"'$  (1.727 m)   Wt 120.2 kg   SpO2 97%   BMI 40.29 kg/m?  ? ? ?General: Awake, mild distress.  ?CV:  RRR.  Good peripheral perfusion.  ?Resp:  Normal effort.  CTA B. ?Abd:  Mild diffuse tenderness to palpation without rebound or guarding.  Moderate distention.  ?Other:  No vesicles. ? ? ?ED Results / Procedures / Treatments  ?Labs ?(all labs ordered are listed, but only abnormal results are displayed) ?Labs Reviewed  ?COMPREHENSIVE METABOLIC PANEL - Abnormal; Notable for the following components:  ?    Result Value  ? Sodium 134 (*)   ? Glucose, Bld 131 (*)   ? Creatinine, Ser 1.52 (*)   ? Total Protein 8.9 (*)   ? GFR, Estimated 46 (*)   ? All other components within normal limits  ?CBC - Abnormal; Notable for the following components:  ? WBC 12.0 (*)   ? All other components within normal limits  ?URINALYSIS, ROUTINE W REFLEX MICROSCOPIC - Abnormal; Notable for the following components:  ? Color, Urine YELLOW (*)   ? APPearance CLEAR (*)   ? Hgb urine dipstick MODERATE (*)   ? Protein, ur 30 (*)   ? RBC / HPF >50 (*)   ? Bacteria, UA RARE (*)   ? All other components within normal limits  ?LIPASE, BLOOD  ?TROPONIN I (HIGH SENSITIVITY)  ? ? ? ?EKG ? ?ED ECG REPORT ?I, Paulette Blanch, the attending physician, personally viewed and interpreted this ECG. ? ? Date: 06/05/2021 ? EKG Time: 0617 ? Rate: 84 ? Rhythm: normal sinus rhythm ? Axis: LAD ? Intervals:none ? ST&T Change: Nonspecific ? ? ? ?RADIOLOGY ?I have independently visualized and reviewed patient's three-way abdomen as well as noted the radiology interpretation: ? ?Three-way abdomen: Pending ? ?Official radiology report(s): ?No results  found. ? ? ?PROCEDURES: ? ?Critical Care performed: No ? ?.1-3 Lead EKG Interpretation ?Performed by: Paulette Blanch, MD ?Authorized by: Paulette Blanch, MD  ? ?  Interpretation: normal   ?  ECG rate:  76 ?  ECG rate

## 2021-06-05 NOTE — Telephone Encounter (Signed)
Message sent via my chart as follows: ? ?"I was at the emerg room Sat afternoon with a bowel blockage.  Stomach was swelled up and hurt so bad I could not rest.  They tried two Enemas.  Nothing.  Gave me a prescription for a laxative .  By Sunday afternoon had two bowel movements. with very hard fecal matter.  After dinner had a large soft bowel movements and then  3 more with diarrhea.  Then I started swelling up again.  Had to go to the [place] room again it was so painful.  This time it was just gas the doctor said." ?

## 2021-06-05 NOTE — ED Provider Notes (Signed)
----------------------------------------- ?  7:03 AM on 06/05/2021 ?----------------------------------------- ? ?Blood pressure (!) 156/73, pulse 76, temperature 98.2 ?F (36.8 ?C), temperature source Oral, resp. rate 14, height '5\' 8"'$  (1.727 m), weight 120.2 kg, SpO2 97 %. ? ?Assuming care from Dr. Beather Arbour.  In short, [name] is a 81 y.o. male with a chief complaint of Abdominal Pain ?Marland Kitchen  Refer to the original H&P for additional details. ? ?The current plan of care is to follow-up XR of abdomen and reassess. ? ?----------------------------------------- ?9:59 AM on 06/05/2021 ?----------------------------------------- ?Abdominal x-ray reviewed by me and shows some dilated loops of bowel concerning for possible ileus but no obvious evidence of obstruction.  On reassessment, patient continues to complain of significant abdominal pain with distention.  CT scan was performed to rule out obstruction and is negative for acute process.  CT did show significantly enlarged prostate, however patient is currently following with urology for this.  Suspect his symptoms are related to constipation and gas, pain improving on reassessment and he is appropriate for outpatient follow-up with his GI provider.  He was counseled to continue fiber supplementation and MiraLAX, we will start on Colace and prescribe small amount of pain medication, which he was counseled to limit as much as possible.  He was counseled to return to the ED for new worsening symptoms, patient agrees with plan. ? ?  ?Blake Divine, MD ?06/05/21 1000 ? ?

## 2021-06-05 NOTE — ED Notes (Signed)
Pt assisted to bathroom with steady gait

## 2021-06-05 NOTE — ED Notes (Addendum)
Pt placed back on monitoring cords. Pt updated on POC and oncoming MD. Pt still reports 8/10 pain and requesting pain medication. MD aware.  ?

## 2021-06-06 ENCOUNTER — Telehealth: Payer: Self-pay | Admitting: *Deleted

## 2021-06-06 ENCOUNTER — Telehealth: Payer: Self-pay

## 2021-06-06 NOTE — Telephone Encounter (Signed)
Notified patient as instructed, patient pleased °

## 2021-06-06 NOTE — Telephone Encounter (Signed)
Pt sent a my chart message as follows: ? ?" I have been in [place] room Sat and Monday.  Bowel blockage Sat and  large painful gas pocket Monday.  Lots of bloating and pain.  No interest in food or drink.  Everything I put in my mouth makes me feel bloated and in pain.  Pls help - advice or an appointment as quick as possible.  Thanks!!" ?

## 2021-06-06 NOTE — Telephone Encounter (Signed)
Overbook tomorrow 330 PM with Amy Wetmore. ?Cleared with Amy ?Please let patient know, hopefully this will work as appt are very limited. ?Tell him I'm not in the office this week or next or I would work him in ?Thanks all ?JMP ? ?

## 2021-06-06 NOTE — Telephone Encounter (Addendum)
Left VM to return my call ? ? ? ? ?----- Message from Hollice Espy, MD sent at 06/05/2021  2:13 PM EDT ----- ?PET scan results look good.  Disease appears to be isolated to the prostate. ? ?Hollice Espy, MD ? ?

## 2021-06-06 NOTE — Telephone Encounter (Signed)
Dr Hilarie Fredrickson please see note and advise  ?

## 2021-06-06 NOTE — Telephone Encounter (Signed)
Pt was sent a mychart message to let us know how he is doing and also to schedule an appt with Dr. Hilarie Fredrickson. ?

## 2021-06-06 NOTE — Telephone Encounter (Signed)
[name], [name], MD  Duane Huxley, RN ?My patient, note sent to me from ER visit  ?Needs to see an APP next available.  I am away this week for hospital duty and then for vacation next week.  ?JMP  ? ? ?Pt scheduled to see Vicie Mutters PA 06/14/21 at 1:30pm. Pt aware of appt. ?

## 2021-06-07 ENCOUNTER — Ambulatory Visit: Payer: Medicare PPO | Admitting: Physician Assistant

## 2021-06-07 DIAGNOSIS — R800 Isolated proteinuria: Secondary | ICD-10-CM | POA: Diagnosis not present

## 2021-06-07 DIAGNOSIS — I10 Essential (primary) hypertension: Secondary | ICD-10-CM | POA: Diagnosis not present

## 2021-06-07 DIAGNOSIS — R809 Proteinuria, unspecified: Secondary | ICD-10-CM | POA: Diagnosis not present

## 2021-06-07 DIAGNOSIS — N1832 Chronic kidney disease, stage 3b: Secondary | ICD-10-CM | POA: Diagnosis not present

## 2021-06-07 DIAGNOSIS — N1831 Chronic kidney disease, stage 3a: Secondary | ICD-10-CM | POA: Diagnosis not present

## 2021-06-07 DIAGNOSIS — R3129 Other microscopic hematuria: Secondary | ICD-10-CM | POA: Diagnosis not present

## 2021-06-07 DIAGNOSIS — M1000 Idiopathic gout, unspecified site: Secondary | ICD-10-CM | POA: Diagnosis not present

## 2021-06-07 DIAGNOSIS — R8290 Unspecified abnormal findings in urine: Secondary | ICD-10-CM | POA: Diagnosis not present

## 2021-06-07 DIAGNOSIS — N200 Calculus of kidney: Secondary | ICD-10-CM | POA: Diagnosis not present

## 2021-06-07 NOTE — Telephone Encounter (Signed)
The appt for today was missed by the pt because the appt was sent to him in My Chart and he did not view the message until after 4 pm.  The pt was called and moved to 06/08/21 at 10 am with Vicie Mutters PA.. I apologized to him for not calling and making him aware of the appt.  He was very kind and states he intended to read the message but got side tracked and did not.  I again told him I was very sorry for the inconvenience.  He will keep the appt for tomorrow.   ?

## 2021-06-08 ENCOUNTER — Ambulatory Visit: Payer: Medicare PPO | Admitting: Nurse Practitioner

## 2021-06-08 ENCOUNTER — Other Ambulatory Visit (INDEPENDENT_AMBULATORY_CARE_PROVIDER_SITE_OTHER): Payer: Medicare PPO

## 2021-06-08 ENCOUNTER — Encounter: Payer: Self-pay | Admitting: Nurse Practitioner

## 2021-06-08 ENCOUNTER — Ambulatory Visit (INDEPENDENT_AMBULATORY_CARE_PROVIDER_SITE_OTHER)
Admission: RE | Admit: 2021-06-08 | Discharge: 2021-06-08 | Disposition: A | Discharge status: Home / Self Care | Payer: Medicare PPO | Source: Ambulatory Visit | Attending: Nurse Practitioner | Admitting: Nurse Practitioner

## 2021-06-08 VITALS — BP 122/76 | HR 67 | Ht 68.0 in | Wt 254.4 lb

## 2021-06-08 DIAGNOSIS — R140 Abdominal distension (gaseous): Secondary | ICD-10-CM

## 2021-06-08 DIAGNOSIS — K5900 Constipation, unspecified: Secondary | ICD-10-CM

## 2021-06-08 LAB — BASIC METABOLIC PANEL
BUN: 19 mg/dL (ref 6–23)
CO2: 26 mEq/L (ref 19–32)
Calcium: 9.1 mg/dL (ref 8.4–10.5)
Chloride: 101 mEq/L (ref 96–112)
Creatinine, Ser: 1.51 mg/dL — ABNORMAL HIGH (ref 0.40–1.50)
GFR: 42.99 mL/min — ABNORMAL LOW (ref >60.00)
Glucose, Bld: 110 mg/dL — ABNORMAL HIGH (ref 70–99)
Potassium: 3.8 mEq/L (ref 3.5–5.1)
Sodium: 137 mEq/L (ref 135–145)

## 2021-06-08 LAB — TSH: TSH: 3.32 u[IU]/mL (ref 0.35–5.50)

## 2021-06-08 LAB — MAGNESIUM: Magnesium: 2.2 mg/dL (ref 1.5–2.5)

## 2021-06-08 NOTE — Patient Instructions (Addendum)
Please proceed to the basement level for lab work before leaving today. Press "B" on the elevator. The lab is located at the first door on the left as you exit the elevator. ? ?Your provider has requested that you have an abdominal x ray before leaving today. Please go to the basement floor to our Radiology department for the test. ? ?RECOMMENDATIONS: ? ?Miralax- Dissolve one capful in 8 ounces of water and drink twice a day. ?Gas-X 1 tablet twice a day. ?Increase water intake to 64 oz daily. ?Stop Benefiber. ?Further bowel regimen instructions to be provided after lab and x-ray results are reviewed. ? ?Thank you for trusting me with your gastrointestinal care!   ? ?Noralyn Pick, CRNP ? ? ? ?BMI: ? ?If you are age 56 or older, your body mass index should be between 23-30. Your Body mass index is 38.68 kg/m?Marland Kitchen If this is out of the aforementioned range listed, please consider follow up with your Primary Care Provider. ? ?If you are age 38 or younger, your body mass index should be between 19-25. Your Body mass index is 38.68 kg/m?Marland Kitchen If this is out of the aformentioned range listed, please consider follow up with your Primary Care Provider.  ? ?MY CHART: ? ?The [place] [facility] providers would like to encourage you to use [facility] to communicate with providers for non-urgent requests or questions.  Due to long hold times on the telephone, sending your provider a message by [facility] may be a faster and more efficient way to get a response.  Please allow 48 business hours for a response.  Please remember that this is for non-urgent requests.  ? ?

## 2021-06-08 NOTE — Progress Notes (Signed)
? ? ? ?06/08/2021 ?[name] ?[id number] ?1940/05/07 ? ? ?CHIEF COMPLAINT: Constipation  ? ?HISTORY OF PRESENT ILLNESS:  [name] is an 81 year old male with a past medical history of anxiety, arthritis, kidney stones, recurrent hematuria, urethral stricture, BPH, gout, prostate cancer, hepatic steatosis, GERD, pancreatitis and colon polyps. ? ?He typically passes a normal formed brown stool x 2 in the morning as long as he took Miralax daily. About 2 1/2 weeks ago, he felt well and didn't think he needed to take the Miralax any longer. He continued to have at least one to 2 bowel movements daily. On Friday 06/02/2021, he developed abdominal bloat with generalized abdominal pain. No N/V. He passes two formed stools. No BM the next day, passed minimal gas per the rectum. His abdominal pain and bloat worsened so he presented to the ED April 27, 12023 for further evaluation. Labs showed a WBC count of 14.6.  CTAP showed a focal distended cecum with retained stool without evidence of obstruction or colitis.  Marked colonic diverticulosis without evidence of acute diverticulitis was noted.  He received 2 enemas in the ED without which resulted in passing a very small bit of stool. He was discharged home with a prescription for a Lactulose. He took 3 doses of Lactulose and Jun 30, 2022 he had passed two hard stools and he felt quite well.  No rectal bleeding or black stools.  He went out to dinner and later passed a soft bowel movement followed by 3 episodes of diarrhea with worsening generalized abdominal pain and cramping.  He went back to the ED 06/05/2021.  Labs showed a WBC count 12.0.  Hemoglobin 14.3.  Sodium 134.  BUN 21.  Creatinine 1.52 (Cr level 1.08 on 2021/06/29).  An abdominal x-ray showed increased gas throughout the large bowel and some distal small bowel loops concerning for possible ileus without evidence of an obstruction.  A repeat CTAP with contrast without evidence of dilatation to the small bowel or  colon, diverticulosis without evidence of diverticulitis and there were no inflammatory changes noted adjacent to the cecum to suggest the presence of an acute appendicitis.  He was discharged home on Oxycodone 5 mg every 8 hours as needed, docusate sodium 100 mg twice daily and to continue Benefiber and MiraLAX. ? ?He presents to our office today for further GI follow-up.  No BM since Sunday 06/04/2021.  He is taking MiraLAX and Benefiber twice daily.  He took Hydrocodone '5mg'$  po x 1 on 4/4 and 4/5 due to generalized abdominal pain.  He denies having any abdominal pain today, however, his abdomen remains bloated and distended.  He is passing very small amounts of gas per the rectum.  He stated in the past, he would pass large volumes of gas per the rectum on a daily basis which was normal for him.  No fever.  No nausea or vomiting.  He drinks 30 ounces of water daily.  No new medications over the past 3 to 4 months with exception of the medications administered and prescribed by the ED providers as noted above.  He has lost 10 to 15 pounds over the past several weeks.  His most recent colonoscopy was 04/02/2017 which identified one 4 mm tubular adenomatous polyp removed from the sigmoid colon and diverticulosis throughout the examined colon. ? ?He has prostate cancer and he is scheduled for fiducial marker placement next week with eventual IMRT. ? ? ? ?  Latest Ref Rng & Units 06/05/2021  ?  3:07 AM 06/03/2021  ?  1:26 PM 02/17/2021  ?  8:36 AM  ?CBC  ?WBC 4.0 - 10.5 K/uL 12.0   14.6   7.9    ?Hemoglobin 13.0 - 17.0 g/dL 14.3   14.5   14.3    ?Hematocrit 39.0 - 52.0 % 43.8   43.6   42.5    ?Platelets 150 - 400 K/uL 240   226   214.0    ?  ? ?  Latest Ref Rng & Units 06/05/2021  ?  3:07 AM 06/03/2021  ?  1:26 PM 02/17/2021  ?  8:36 AM  ?CMP  ?Glucose 70 - 99 mg/dL 131   120     ?BUN 8 - 23 mg/dL 21   15     ?Creatinine 0.61 - 1.24 mg/dL 1.52   1.08     ?Sodium 135 - 145 mmol/L 134   136     ?Potassium 3.5 - 5.1 mmol/L 4.0   4.1      ?Chloride 98 - 111 mmol/L 100   100     ?CO2 22 - 32 mmol/L 26   26     ?Calcium 8.9 - 10.3 mg/dL 8.9   9.2     ?Total Protein 6.5 - 8.1 g/dL 8.9   8.4   7.8    ?Total Bilirubin 0.3 - 1.2 mg/dL 0.9   0.9   0.7    ?Alkaline Phos 38 - 126 U/L 79   77   84    ?AST 15 - 41 U/L 40   38   23    ?ALT 0 - 44 U/L 42   37   31    ?Lipase 30 and 36.  ? ?Abdominal x-ray 06/05/2021: ?1. Increased gas throughout the large bowel and some distal small ?bowel loops since 06/03/2021 CT. Ileus is possible. The pattern does ?not suggest mechanical bowel obstruction at this time. ?  ?2. No pneumoperitoneum identified. Low lung volumes with ?atelectasis. ? ?CTAP with contrast 06/05/2021: ?Multidetector CT imaging of the abdomen and pelvis was performed ?using the standard protocol following bolus administration of ?intravenous contrast. ?  ?RADIATION DOSE REDUCTION: This exam was performed according to the ?departmental dose-optimization program which includes automated ?exposure control, adjustment of the mA and/or kV according to ?patient size and/or use of iterative reconstruction technique. ?  ?CONTRAST:  123m OMNIPAQUE IOHEXOL 300 MG/ML  SOLN ?  ?COMPARISON:  CT the abdomen and pelvis 06/03/2021. ?  ?FINDINGS: ?Lower chest: Aortic atherosclerosis. Atherosclerotic calcifications ?are noted in the left anterior descending, left circumflex and right ?coronary arteries. Calcifications of the aortic valve and mitral ?annulus. ?  ?Hepatobiliary: Severe diffuse low attenuation throughout the hepatic ?parenchyma, indicative of severe hepatic steatosis. Calcified ?granuloma in segment 5 of the liver incidentally noted. ?Subcentimeter low-attenuation lesion in segment 4A (axial image 23 ?of series 2), too small to characterize, but similar to the prior ?examination and statistically likely a tiny cyst. No other ?suspicious hepatic lesions. No intra or extrahepatic biliary ductal ?dilatation. Gallbladder is normal in appearance. ?  ?Pancreas:  No pancreatic mass. No pancreatic ductal dilatation. No ?pancreatic or peripancreatic fluid collections or inflammatory ?changes. ?  ?Spleen: Subcentimeter low-attenuation lesion in the posterior aspect ?of the spleen incidentally noted, too small to characterize, but ?likely benign. Small splenule inferior to the spleen also noted. ?  ?Adrenals/Urinary Tract: Mild diffuse cortical atrophy in the kidneys ?bilaterally. Subcentimeter low-attenuation lesion in the lower pole ?of the left kidney, too small to  characterize, but statistically ?likely to represent a tiny cyst. Bilateral adrenal glands are normal ?in appearance. No hydroureteronephrosis. Urinary bladder is normal ?in appearance. ?  ?Stomach/Bowel: The appearance of the stomach is normal. There is no ?pathologic dilatation of small bowel or colon. Numerous colonic ?diverticulae are noted, particularly in the sigmoid colon, without ?definite focal surrounding inflammatory changes to clearly indicate ?an acute diverticulitis at this time. The appendix is not ?confidently identified and may be surgically absent. Regardless, ?there are no inflammatory changes noted adjacent to the cecum to ?suggest the presence of an acute appendicitis at this time. ?  ?Vascular/Lymphatic: Aortic atherosclerosis, without evidence of ?aneurysm or dissection in the abdominal or pelvic vasculature. No ?lymphadenopathy noted in the abdomen or pelvis. ?  ?Reproductive: Prostate gland is enlarged and very heterogeneous in ?appearance measuring up to 5.5 x 4.9 x 7.9 cm. Asymmetric ?enlargement and enhancement of the right seminal vesicle (axial ?image 84 of series 2). ?  ?Other: No significant volume of ascites.  No pneumoperitoneum. ?  ?Musculoskeletal: There are no aggressive appearing lytic or blastic ?lesions noted in the visualized portions of the skeleton. ?  ?IMPRESSION: ?1. Extensive colonic diverticulosis without definitive imaging ?findings to clearly indicate an acute  diverticulitis at this time. ?2. Enlarged and heterogeneous appearing prostate gland with ?asymmetric enlargement and enhancement of the right seminal vesicle. ?Correlation with PSA level and physical examinat

## 2021-06-08 NOTE — Progress Notes (Deleted)
? ? ?06/08/2021 ?[name] ?[id number] ?11/21/1940 ? ? ?ASSESSMENT AND PLAN:  ? ?There are no diagnoses linked to this encounter. ? ? ?History of Present Illness:  ?81 y.o. male  with a past medical history of *** and others listed below, known to *** returns to clinic today for evaluation of ***. ?04/02/2017 Colonoscopy  ?06/05/21 CT AB pelvis and contrast ? ?Previous GI history: ? ?CT Abdomen Pelvis W Contrast ? ?Result Date: 06/05/2021 ?CLINICAL DATA:  81 year old male with history of abdominal pain and abdominal distension. Suspected bowel obstruction. EXAM: CT ABDOMEN AND PELVIS WITH CONTRAST TECHNIQUE: Multidetector CT imaging of the abdomen and pelvis was performed using the standard protocol following bolus administration of intravenous contrast. RADIATION DOSE REDUCTION: This exam was performed according to the departmental dose-optimization program which includes automated exposure control, adjustment of the mA and/or kV according to patient size and/or use of iterative reconstruction technique. CONTRAST:  135m OMNIPAQUE IOHEXOL 300 MG/ML  SOLN COMPARISON:  CT the abdomen and pelvis 06/03/2021. FINDINGS: Lower chest: Aortic atherosclerosis. Atherosclerotic calcifications are noted in the left anterior descending, left circumflex and right coronary arteries. Calcifications of the aortic valve and mitral annulus. Hepatobiliary: Severe diffuse low attenuation throughout the hepatic parenchyma, indicative of severe hepatic steatosis. Calcified granuloma in segment 5 of the liver incidentally noted. Subcentimeter low-attenuation lesion in segment 4A (axial image 23 of series 2), too small to characterize, but similar to the prior examination and statistically likely a tiny cyst. No other suspicious hepatic lesions. No intra or extrahepatic biliary ductal dilatation. Gallbladder is normal in appearance. Pancreas: No pancreatic mass. No pancreatic ductal dilatation. No pancreatic or peripancreatic fluid  collections or inflammatory changes. Spleen: Subcentimeter low-attenuation lesion in the posterior aspect of the spleen incidentally noted, too small to characterize, but likely benign. Small splenule inferior to the spleen also noted. Adrenals/Urinary Tract: Mild diffuse cortical atrophy in the kidneys bilaterally. Subcentimeter low-attenuation lesion in the lower pole of the left kidney, too small to characterize, but statistically likely to represent a tiny cyst. Bilateral adrenal glands are normal in appearance. No hydroureteronephrosis. Urinary bladder is normal in appearance. Stomach/Bowel: The appearance of the stomach is normal. There is no pathologic dilatation of small bowel or colon. Numerous colonic diverticulae are noted, particularly in the sigmoid colon, without definite focal surrounding inflammatory changes to clearly indicate an acute diverticulitis at this time. The appendix is not confidently identified and may be surgically absent. Regardless, there are no inflammatory changes noted adjacent to the cecum to suggest the presence of an acute appendicitis at this time. Vascular/Lymphatic: Aortic atherosclerosis, without evidence of aneurysm or dissection in the abdominal or pelvic vasculature. No lymphadenopathy noted in the abdomen or pelvis. Reproductive: Prostate gland is enlarged and very heterogeneous in appearance measuring up to 5.5 x 4.9 x 7.9 cm. Asymmetric enlargement and enhancement of the right seminal vesicle (axial image 84 of series 2). Other: No significant volume of ascites.  No pneumoperitoneum. Musculoskeletal: There are no aggressive appearing lytic or blastic lesions noted in the visualized portions of the skeleton. IMPRESSION: 1. Extensive colonic diverticulosis without definitive imaging findings to clearly indicate an acute diverticulitis at this time. 2. Enlarged and heterogeneous appearing prostate gland with asymmetric enlargement and enhancement of the right seminal  vesicle. Correlation with PSA level and physical examination is recommended. 3. Severe hepatic steatosis. 4. Aortic atherosclerosis, in addition to three-vessel coronary artery disease. 5. There are calcifications of the aortic valve and mitral annulus. Echocardiographic correlation for evaluation of  potential valvular dysfunction may be warranted if clinically indicated. 6. Additional incidental findings, as above. Electronically Signed   By: Vinnie Langton M.D.   On: 06/05/2021 08:28  ? ?CT ABDOMEN PELVIS W CONTRAST ? ?Result Date: 06/03/2021 ?CLINICAL DATA:  Bowel obstruction suspected EXAM: CT ABDOMEN AND PELVIS WITH CONTRAST TECHNIQUE: Multidetector CT imaging of the abdomen and pelvis was performed using the standard protocol following bolus administration of intravenous contrast. RADIATION DOSE REDUCTION: This exam was performed according to the departmental dose-optimization program which includes automated exposure control, adjustment of the mA and/or kV according to patient size and/or use of iterative reconstruction technique. CONTRAST:  151m OMNIPAQUE IOHEXOL 300 MG/ML  SOLN COMPARISON:  CT examination dated Jul 11, 2017 FINDINGS: Lower chest: No acute abnormality. Hepatobiliary: No focal liver abnormality is seen. No gallstones, gallbladder wall thickening, or biliary dilatation. Pancreas: Unremarkable. No pancreatic ductal dilatation or surrounding inflammatory changes. Spleen: Normal in size. Hypodense structure in the spleen measuring up to 6 mm, likely hemangioma or cyst, unchanged. Adrenals/Urinary Tract: Adrenal glands are unremarkable. Kidneys are normal, without renal calculi, focal lesion, or hydronephrosis. Bladder is unremarkable. Stomach/Bowel: Stomach is within normal limits. Appendix not clearly seen. There is focal dilated cecum with moderate amount of stool. Bowel loops are otherwise normal in caliber without evidence of obstruction. No bowel wall thickening. There is marked colonic  diverticulosis without evidence of acute diverticulitis. Vascular/Lymphatic: Aortic atherosclerosis. No enlarged abdominal or pelvic lymph nodes. Reproductive: Prostate is mildly enlarged. Other: There are bilateral fat containing inguinal hernias. No evidence of obstruction. Musculoskeletal: Degenerative disc disease of the thoracolumbar spine. Mild bilateral hip osteoarthritis. No acute osseous abnormality. IMPRESSION: 1. Marked colonic diverticulosis without evidence of acute diverticulitis. 2. Focal distended cecum with retained stool, without evidence of obstruction or colitis. 3.  Enlarged prostate. 4.  No CT evidence of acute abdominal/pelvic process. Electronically Signed   By: IKeane PoliceD.O.   On: 06/03/2021 14:33  ? ?DG Abd Acute W/Chest ? ?Result Date: 06/05/2021 ?CLINICAL DATA:  81year old male with ongoing abdominal pain. Prostate cancer. EXAM: DG ABDOMEN ACUTE WITH 1 VIEW CHEST COMPARISON:  CT Abdomen and Pelvis 06/03/2021. FINDINGS: Upright AP view of the chest at 0649 hours. Low lung volumes. Mediastinal contours remain within normal limits. Mild crowding of markings at both lung bases. No pneumothorax, pulmonary edema, pleural effusion, or consolidation. Three views of the abdomen and pelvis. Increased gas throughout redundant large bowel since the CT 2 days ago. And there are gas-filled but nondilated distal small bowel loops. No pneumoperitoneum identified. Stable abdominal and pelvic visceral contours. Superficial oval radiopacities again project over the pelvis (outside the pelvis on prior CT). No acute osseous abnormality identified. Pelvic phleboliths. IMPRESSION: 1. Increased gas throughout the large bowel and some distal small bowel loops since 06/03/2021 CT. Ileus is possible. The pattern does not suggest mechanical bowel obstruction at this time. 2. No pneumoperitoneum identified. Low lung volumes with atelectasis. Electronically Signed   By: HGenevie AnnM.D.   On: 06/05/2021 07:19  ? ?NM  PET (PSMA) SKULL TO MID THIGH ? ?Result Date: 06/05/2021 ?CLINICAL DATA:  Initial treatment strategy for prostate carcinoma. EXAM: NUCLEAR MEDICINE PET SKULL BASE TO THIGH TECHNIQUE: 7.6 mCi F18 Piflufolastat (Pylarify) w

## 2021-06-12 ENCOUNTER — Encounter: Payer: Self-pay | Admitting: Urology

## 2021-06-12 NOTE — Progress Notes (Signed)
? ?  06/13/21 ? ?CC: ADT/ gold seeds ? ?HPI: ?[name] is a 81 y.o.male  with a personal history of prostate cancer, recurrent gross hematuria, urethral stricture, and BPH who presents today for gold see markers/ Eligard.  ? ?He is s/p HoLEP and cystolitholapaxy in 2019.  He was doing well but ultimately developed recurrent gross hematuria secondary to dystrophic calcifications and was also found to have a bulbar urethral stricture.  He returned to the operating room on 11/17/2018 for dilation of bulbar urethral stricture. ?  ?He underwent a prostate biopsy in 2017 that was negative. Prostate volume preoperatively was 120 cc. His new baseline PSA is 3.4 mL.  ? ?His most recent PSA is 13.1 and was steadily trending upwards with surveillance.  ?  ?His prostate MRI on 08/19/2018 visualized nodular transition zone that was consistent with BPH, and PI-RADS 1 with some incidental asymmetry of the seminal vesicle. Prostatic volume was calculated to be 56 cc. ?  ?Repeat prostate biopsy on 08/12/2019 revealed surgical pathology consistent with low volume Gleason 3+3 up to 6 % involving 2 of 12 cores primarily at the right mid and right apex. Cystoscopy revealed regrowth of right lobe only appreciated on TRUS, TRUS volume was 70g.  ?  ?Prostate MRI on 03/23/2021 revealed PI-RADS category 5 lesion of the right peripheral zone of the apex. This has greater than 1.5 cm of capsular contact which can predispose to occult transcapsular spread. Targeting data sent to [place]. Moderate prostatomegaly and benign prostatic hypertrophy. ?  ?He is s/p fusion biopsy on 05/02/2021. Surgical pathology was consistent with Gleason 4+4 involving 3 of 12 cores affecting up to 80% primarily in the right apex, and Gleason 4+3 involving 1 of 12 cored affecting 5% primarily in the right mid.  ? ?Now scheduled for IMRT with Eligard depo starting today.  Presents today for gold seeds.   ?  ? ?Vitals:  ? 06/13/21 1537  ?BP: (!) 155/69  ?Pulse: 87   ? ?NED. A&Ox3.   ?No respiratory distress   ?Abd soft, NT, ND ?Normal external genitalia with patent urethral meatus ? ? ?Prostate Gold Seed Marker Placement Procedure  ? ?Informed consent was obtained after discussing risks/benefits of the procedure.  A time out was performed to ensure correct patient identity. ? ?Pre-Procedure: ?- Gentamicin given prophylactically ?- PO Levaquin 500 mg also given today ? ?Procedure: ?-Lidocaine jelly was administered per rectum ?-Rectal ultrasound probe was placed without difficulty and the prostate visualized ?- 3 fiducial gold seed markers placed, one at right base, one at left base, one at apex of prostate gland under transrectal ultrasound guidance ? ?Post-Procedure: ?- Patient tolerated the procedure well ?- He was counseled to seek immediate medical attention if experiences any severe pain, significant bleeding, or fevers ? ?Assessment and Plan: ? ?Prostate cancer  ?- Gold seed markers implanted today  ?- warning symptoms and bone health were reviewed today  ?-depo ADT started today for 2-3 year course, reviewed side effects and bone health recommendations ? ?[name] as a scribe for Hollice Espy, MD.,have documented all relevant documentation on the behalf of Hollice Espy, MD,as directed by  Hollice Espy, MD while in the presence of Hollice Espy, MD. ? ?Hollice Espy, MD ? ?

## 2021-06-13 ENCOUNTER — Ambulatory Visit: Payer: Medicare PPO | Admitting: Urology

## 2021-06-13 ENCOUNTER — Encounter: Payer: Self-pay | Admitting: Urology

## 2021-06-13 VITALS — BP 155/69 | HR 87 | Ht 68.0 in

## 2021-06-13 DIAGNOSIS — C61 Malignant neoplasm of prostate: Secondary | ICD-10-CM | POA: Diagnosis not present

## 2021-06-13 MED ORDER — GENTAMICIN SULFATE 40 MG/ML IJ SOLN
80.0000 mg | Freq: Once | INTRAMUSCULAR | Status: AC
  Administered 2021-06-13: 80 mg via INTRAMUSCULAR

## 2021-06-13 MED ORDER — LEVOFLOXACIN 500 MG PO TABS
500.0000 mg | ORAL_TABLET | Freq: Once | ORAL | Status: AC
  Administered 2021-06-13: 500 mg via ORAL

## 2021-06-13 MED ORDER — LEUPROLIDE ACETATE (6 MONTH) 45 MG ~~LOC~~ KIT
45.0000 mg | PACK | Freq: Once | SUBCUTANEOUS | Status: AC
  Administered 2021-06-13: 45 mg via SUBCUTANEOUS

## 2021-06-13 NOTE — Patient Instructions (Signed)
Please take Vit D & Calcium daily  ?

## 2021-06-13 NOTE — Progress Notes (Signed)
Eligard SubQ Injection  ? ?Due to Prostate Cancer patient is present today for a Eligard Injection. ? ?Medication: Eligard 6 month ?Dose: 45 mg  ?Location: Right lower abdominal  ?Lot: [id number] ?Exp: 02/02/23 ? ?Patient tolerated well, no complications were noted ? ?Performed by: Verlene Mayer, CMA ? ?Per Dr. Erlene Quan patient is to continue therapy for 24 months . Patient's next follow up was scheduled for 12/23/21. This appointment was scheduled using wheel and given to patient today along with reminder continue on Vitamin D 800-1000iu and Calcium 1000-'1200mg'$  daily while on Androgen Deprivation Therapy.  ? ? ?PA approval dates: NO PA required ? ?

## 2021-06-14 ENCOUNTER — Encounter: Payer: Self-pay | Admitting: Family Medicine

## 2021-06-14 ENCOUNTER — Ambulatory Visit: Payer: Medicare PPO | Admitting: Family Medicine

## 2021-06-14 ENCOUNTER — Ambulatory Visit: Payer: Medicare PPO | Admitting: Physician Assistant

## 2021-06-14 DIAGNOSIS — M25551 Pain in right hip: Secondary | ICD-10-CM

## 2021-06-14 DIAGNOSIS — M25552 Pain in left hip: Secondary | ICD-10-CM | POA: Diagnosis not present

## 2021-06-14 DIAGNOSIS — M1000 Idiopathic gout, unspecified site: Secondary | ICD-10-CM | POA: Diagnosis not present

## 2021-06-14 DIAGNOSIS — K5900 Constipation, unspecified: Secondary | ICD-10-CM | POA: Diagnosis not present

## 2021-06-14 MED ORDER — TRAMADOL HCL 50 MG PO TABS: 50.0000 mg | ORAL_TABLET | Freq: Three times a day (TID) | ORAL | 0 refills | Status: AC | PRN

## 2021-06-14 NOTE — Assessment & Plan Note (Signed)
Improved with use of Mitigare.  He will monitor for recurrence.  He can continue as needed use of Medicare. ?

## 2021-06-14 NOTE — Assessment & Plan Note (Signed)
Much improved with his current regimen through GI. ?

## 2021-06-14 NOTE — Patient Instructions (Signed)
Nice to see you. ?Please monitor your hip pain.  If it continues to be an issue over the next couple of days please let me know and we can get x-rays. ?You can take the tramadol for any pain in your hips.  Do not mix this with the oxycodone.  If this makes you excessively drowsy please discontinue it. ?

## 2021-06-14 NOTE — Assessment & Plan Note (Signed)
Possibly related to arthritis.  Discussed monitoring for another couple of days and then consider x-ray at that time if still bothering him.  He cannot take NSAIDs given chronic kidney disease.  We will treat his pain with tramadol as prescribed. ?

## 2021-06-14 NOTE — Progress Notes (Signed)
?Tommi Rumps, MD ?Phone: [phone] ? ?[name] is a 81 y.o. male who presents today for same-day visit. ? ?Constipation: Patient has been having significant issues with constipation.  He was having some abdominal pain with this and got bloated.  He went to the emergency department and was treated with enemas and a laxative and had a bowel movement.  He subsequently had recurrence of discomfort and he went back to the ED.  There was concern for ileus on plain film so CT was obtained and was negative for obstruction.  He was discharged to start fiber supplementation and MiraLAX as well as Colace.  He subsequently followed up with GI and they provided him with a regimen to help with this.  He notes he is now having good bowel movements. ? ?Gout flare: Patient reports gout flare in bilateral first MTP joints.  Has been taking Mitigare and notes this has been improving. ? ?Bilateral hip pain: Patient reported in the past several days waking up with discomfort in his right inner thigh that was fairly significant.  That eased up and then his bilateral hip started to hurt.  He notes no injury.  No medication changes.  He did take an oxycodone which helped ease the pain off. ? ?Social History  ? ?Tobacco Use  ?Smoking Status Never  ?Smokeless Tobacco Never  ? ? ?Current Outpatient Medications on File Prior to Visit  ?Medication Sig Dispense Refill  ? amLODipine (NORVASC) 10 MG tablet TAKE 1 TABLET BY MOUTH EVERYDAY AT BEDTIME 90 tablet 1  ? aspirin 81 MG tablet Take 81 mg by mouth daily.    ? metoprolol succinate (TOPROL-XL) 50 MG 24 hr tablet TAKE 1 TABLET BY MOUTH EVERY DAY 90 tablet 1  ? Misc Natural Products (TART CHERRY ADVANCED PO) Take 425 mg by mouth daily.    ? MITIGARE 0.6 MG CAPS TAKE 0.6 MG BY MOUTH DAILY AS NEEDED (GOUT FLARE). 90 capsule 1  ? Multiple Vitamins-Minerals ([employer] SPECTRAVITE SENIOR) TABS Take 1 tablet by mouth daily.     ? pantoprazole (PROTONIX) 40 MG tablet TAKE 1 TABLET BY MOUTH TWICE  A DAY 180 tablet 3  ? tamsulosin (FLOMAX) 0.4 MG CAPS capsule Take by mouth.    ? Wheat Dextrin (BENEFIBER DRINK MIX PO) Take by mouth.    ? ?Current Facility-Administered Medications on File Prior to Visit  ?Medication Dose Route Frequency Provider Last Rate Last Admin  ? 0.9 %  sodium chloride infusion  500 mL Intravenous Once Armbruster, Carlota Raspberry, MD      ? ? ? ?ROS see history of present illness ? ?Objective ? ?Physical Exam ?Vitals:  ? 06/14/21 1619  ?BP: (!) 150/70  ?Pulse: 70  ?Temp: 98.6 ?F (37 ?C)  ?SpO2: 96%  ? ? ?BP Readings from Last 3 Encounters:  ?06/14/21 (!) 150/70  ?06/13/21 (!) 155/69  ?06/08/21 122/76  ? ?Wt Readings from Last 3 Encounters:  ?06/14/21 257 lb 6.4 oz (116.8 kg)  ?06/08/21 254 lb 6.4 oz (115.4 kg)  ?06/05/21 265 lb (120.2 kg)  ? ? ?Physical Exam ?Constitutional:   ?   General: He is not in acute distress. ?   Appearance: He is not diaphoretic.  ?Pulmonary:  ?   Effort: Pulmonary effort is normal.  ?Abdominal:  ?   General: Bowel sounds are normal. There is no distension.  ?   Palpations: Abdomen is soft.  ?   Tenderness: There is no abdominal tenderness.  ?Musculoskeletal:  ?  Comments: Bilateral hips with slight decreased external range of motion, good internal range of motion, no discomfort on range of motion of bilateral hips, no tenderness on palpation of his lateral hips bilaterally  ?Skin: ?   General: Skin is warm and dry.  ?Neurological:  ?   Mental Status: He is alert.  ? ? ? ?Assessment/Plan: Please see individual problem list. ? ?Problem List Items Addressed This Visit   ? ? Constipation (Chronic)  ?  Much improved with his current regimen through GI. ?  ?  ? Gout (Chronic)  ?  Improved with use of Mitigare.  He will monitor for recurrence.  He can continue as needed use of Medicare. ?  ?  ? Bilateral hip pain  ?  Possibly related to arthritis.  Discussed monitoring for another couple of days and then consider x-ray at that time if still bothering him.  He cannot take  NSAIDs given chronic kidney disease.  We will treat his pain with tramadol as prescribed. ?  ?  ? ? ? ?Return if symptoms worsen or fail to improve. ? ?This visit occurred during the SARS-CoV-2 public health emergency.  Safety protocols were in place, including screening questions prior to the visit, additional usage of staff PPE, and extensive cleaning of exam room while observing appropriate contact time as indicated for disinfecting solutions.  ? ? ?Tommi Rumps, MD ?[facility] ? ?

## 2021-06-14 NOTE — Telephone Encounter (Signed)
Pt here in office today for appointment.  ?

## 2021-06-15 ENCOUNTER — Ambulatory Visit: Payer: Medicare PPO | Admitting: Gastroenterology

## 2021-06-15 ENCOUNTER — Encounter: Payer: Self-pay | Admitting: Urology

## 2021-06-15 ENCOUNTER — Ambulatory Visit
Admission: RE | Admit: 2021-06-15 | Discharge: 2021-06-15 | Disposition: A | Discharge status: Home / Self Care | Payer: Medicare PPO | Source: Ambulatory Visit | Attending: Radiation Oncology | Admitting: Radiation Oncology

## 2021-06-15 ENCOUNTER — Encounter: Payer: Self-pay | Admitting: Family Medicine

## 2021-06-15 DIAGNOSIS — C61 Malignant neoplasm of prostate: Secondary | ICD-10-CM | POA: Diagnosis not present

## 2021-06-15 DIAGNOSIS — N400 Enlarged prostate without lower urinary tract symptoms: Secondary | ICD-10-CM | POA: Diagnosis not present

## 2021-06-15 DIAGNOSIS — N1832 Chronic kidney disease, stage 3b: Secondary | ICD-10-CM | POA: Diagnosis not present

## 2021-06-15 DIAGNOSIS — Z510 Encounter for antineoplastic radiation therapy: Secondary | ICD-10-CM | POA: Insufficient documentation

## 2021-06-15 DIAGNOSIS — R809 Proteinuria, unspecified: Secondary | ICD-10-CM | POA: Diagnosis not present

## 2021-06-15 DIAGNOSIS — M1000 Idiopathic gout, unspecified site: Secondary | ICD-10-CM | POA: Diagnosis not present

## 2021-06-15 DIAGNOSIS — Z8546 Personal history of malignant neoplasm of prostate: Secondary | ICD-10-CM | POA: Diagnosis not present

## 2021-06-15 DIAGNOSIS — R3129 Other microscopic hematuria: Secondary | ICD-10-CM | POA: Diagnosis not present

## 2021-06-15 DIAGNOSIS — N35919 Unspecified urethral stricture, male, unspecified site: Secondary | ICD-10-CM | POA: Diagnosis not present

## 2021-06-15 DIAGNOSIS — N281 Cyst of kidney, acquired: Secondary | ICD-10-CM | POA: Insufficient documentation

## 2021-06-15 DIAGNOSIS — N200 Calculus of kidney: Secondary | ICD-10-CM | POA: Diagnosis not present

## 2021-06-15 DIAGNOSIS — I10 Essential (primary) hypertension: Secondary | ICD-10-CM | POA: Diagnosis not present

## 2021-06-15 DIAGNOSIS — N029 Recurrent and persistent hematuria with unspecified morphologic changes: Secondary | ICD-10-CM | POA: Insufficient documentation

## 2021-06-16 ENCOUNTER — Other Ambulatory Visit: Payer: Self-pay | Admitting: *Deleted

## 2021-06-16 DIAGNOSIS — C61 Malignant neoplasm of prostate: Secondary | ICD-10-CM

## 2021-06-20 DIAGNOSIS — C61 Malignant neoplasm of prostate: Secondary | ICD-10-CM | POA: Diagnosis not present

## 2021-06-20 DIAGNOSIS — Z510 Encounter for antineoplastic radiation therapy: Secondary | ICD-10-CM | POA: Diagnosis not present

## 2021-06-22 ENCOUNTER — Ambulatory Visit: Admission: RE | Admit: 2021-06-22 | Payer: Medicare PPO | Source: Ambulatory Visit

## 2021-06-23 ENCOUNTER — Telehealth: Payer: Self-pay | Admitting: *Deleted

## 2021-06-23 NOTE — Progress Notes (Signed)
Addendum: ?Reviewed and agree with assessment and management plan. ? ?Dottie, ?Can you please call and check on Mr. Tendler to ensure bowel movements are more regular.  He should remain on MiraLAX long-term. ?He should see PCP regarding kidney function if not already done ? ?Keinan Brouillet, [name], MD ? ?

## 2021-06-23 NOTE — Telephone Encounter (Signed)
I have spoken to patient who tells me he is doing much better on the Miralax regimen. He is having 3-4 bowel movements daily but describes them only as soft, not diarrheal. He is advised he should remain on Miralax long term. Patient also indicates that he has already been to his PCP and nephrologist regarding his kidney function. ? ?

## 2021-06-23 NOTE — Telephone Encounter (Signed)
-----   Message from Jerene Bears, MD sent at 06/23/2021 10:18 AM EDT ----- ? ? ? ?----- Message ----- ?From: Noralyn Pick, NP ?Sent: 06/08/2021   1:00 PM EDT ?To: Jerene Bears, MD ? ?

## 2021-06-23 NOTE — Telephone Encounter (Signed)
Author: Jerene Bears, MD Service: Gastroenterology Author Type: Physician  ?Filed: 06/23/2021 10:19 AM Encounter Date: 06/08/2021 Status: Signed  ?Editor: Jerene Bears, MD (Physician)  ?   ?   ?   ?   ?   ?   ?   ?   ?   ?   ?   ?Addendum: ?Reviewed and agree with assessment and management plan. ?  ?Dottie, ?Can you please call and check on [name] to ensure bowel movements are more regular.  He should remain on MiraLAX long-term. ?He should see PCP regarding kidney function if not already done ?  ?[name], [name], MD ?   ?  ? ?

## 2021-06-26 ENCOUNTER — Ambulatory Visit
Admission: RE | Admit: 2021-06-26 | Discharge: 2021-06-26 | Disposition: A | Discharge status: Home / Self Care | Payer: Medicare PPO | Source: Ambulatory Visit | Attending: Radiation Oncology | Admitting: Radiation Oncology

## 2021-06-26 ENCOUNTER — Other Ambulatory Visit: Payer: Self-pay

## 2021-06-26 DIAGNOSIS — Z510 Encounter for antineoplastic radiation therapy: Secondary | ICD-10-CM | POA: Diagnosis not present

## 2021-06-26 DIAGNOSIS — C61 Malignant neoplasm of prostate: Secondary | ICD-10-CM | POA: Diagnosis not present

## 2021-06-26 LAB — RAD ONC ARIA SESSION SUMMARY
Course Elapsed Days: 0
Plan Fractions Treated to Date: 1
Plan Prescribed Dose Per Fraction: 2 Gy
Plan Total Fractions Prescribed: 40
Plan Total Prescribed Dose: 80 Gy
Reference Point Dosage Given to Date: 2 Gy
Reference Point Session Dosage Given: 2 Gy
Session Number: 1

## 2021-06-27 ENCOUNTER — Other Ambulatory Visit: Payer: Self-pay

## 2021-06-27 ENCOUNTER — Ambulatory Visit
Admission: RE | Admit: 2021-06-27 | Discharge: 2021-06-27 | Disposition: A | Discharge status: Home / Self Care | Payer: Medicare PPO | Source: Ambulatory Visit | Attending: Radiation Oncology | Admitting: Radiation Oncology

## 2021-06-27 DIAGNOSIS — C61 Malignant neoplasm of prostate: Secondary | ICD-10-CM | POA: Diagnosis not present

## 2021-06-27 DIAGNOSIS — Z510 Encounter for antineoplastic radiation therapy: Secondary | ICD-10-CM | POA: Diagnosis not present

## 2021-06-27 LAB — RAD ONC ARIA SESSION SUMMARY
Course Elapsed Days: 1
Plan Fractions Treated to Date: 2
Plan Prescribed Dose Per Fraction: 2 Gy
Plan Total Fractions Prescribed: 40
Plan Total Prescribed Dose: 80 Gy
Reference Point Dosage Given to Date: 4 Gy
Reference Point Session Dosage Given: 2 Gy
Session Number: 2

## 2021-06-28 ENCOUNTER — Ambulatory Visit
Admission: RE | Admit: 2021-06-28 | Discharge: 2021-06-28 | Disposition: A | Discharge status: Home / Self Care | Payer: Medicare PPO | Source: Ambulatory Visit | Attending: Radiation Oncology | Admitting: Radiation Oncology

## 2021-06-28 ENCOUNTER — Other Ambulatory Visit: Payer: Self-pay

## 2021-06-28 DIAGNOSIS — Z510 Encounter for antineoplastic radiation therapy: Secondary | ICD-10-CM | POA: Diagnosis not present

## 2021-06-28 DIAGNOSIS — C61 Malignant neoplasm of prostate: Secondary | ICD-10-CM | POA: Diagnosis not present

## 2021-06-28 LAB — RAD ONC ARIA SESSION SUMMARY
Course Elapsed Days: 2
Plan Fractions Treated to Date: 3
Plan Prescribed Dose Per Fraction: 2 Gy
Plan Total Fractions Prescribed: 40
Plan Total Prescribed Dose: 80 Gy
Reference Point Dosage Given to Date: 6 Gy
Reference Point Session Dosage Given: 2 Gy
Session Number: 3

## 2021-06-29 ENCOUNTER — Ambulatory Visit
Admission: RE | Admit: 2021-06-29 | Discharge: 2021-06-29 | Disposition: A | Discharge status: Home / Self Care | Payer: Medicare PPO | Source: Ambulatory Visit | Attending: Radiation Oncology | Admitting: Radiation Oncology

## 2021-06-29 ENCOUNTER — Other Ambulatory Visit: Payer: Self-pay

## 2021-06-29 DIAGNOSIS — C61 Malignant neoplasm of prostate: Secondary | ICD-10-CM | POA: Diagnosis not present

## 2021-06-29 DIAGNOSIS — Z510 Encounter for antineoplastic radiation therapy: Secondary | ICD-10-CM | POA: Diagnosis not present

## 2021-06-29 LAB — RAD ONC ARIA SESSION SUMMARY
Course Elapsed Days: 3
Plan Fractions Treated to Date: 4
Plan Prescribed Dose Per Fraction: 2 Gy
Plan Total Fractions Prescribed: 40
Plan Total Prescribed Dose: 80 Gy
Reference Point Dosage Given to Date: 8 Gy
Reference Point Session Dosage Given: 2 Gy
Session Number: 4

## 2021-06-30 ENCOUNTER — Other Ambulatory Visit: Payer: Self-pay

## 2021-06-30 ENCOUNTER — Ambulatory Visit
Admission: RE | Admit: 2021-06-30 | Discharge: 2021-06-30 | Disposition: A | Discharge status: Home / Self Care | Payer: Medicare PPO | Source: Ambulatory Visit | Attending: Radiation Oncology | Admitting: Radiation Oncology

## 2021-06-30 DIAGNOSIS — Z510 Encounter for antineoplastic radiation therapy: Secondary | ICD-10-CM | POA: Diagnosis not present

## 2021-06-30 DIAGNOSIS — C61 Malignant neoplasm of prostate: Secondary | ICD-10-CM | POA: Diagnosis not present

## 2021-06-30 LAB — RAD ONC ARIA SESSION SUMMARY
Course Elapsed Days: 4
Plan Fractions Treated to Date: 5
Plan Prescribed Dose Per Fraction: 2 Gy
Plan Total Fractions Prescribed: 40
Plan Total Prescribed Dose: 80 Gy
Reference Point Dosage Given to Date: 10 Gy
Reference Point Session Dosage Given: 2 Gy
Session Number: 5

## 2021-07-03 ENCOUNTER — Other Ambulatory Visit: Payer: Self-pay

## 2021-07-03 ENCOUNTER — Ambulatory Visit
Admission: RE | Admit: 2021-07-03 | Discharge: 2021-07-03 | Disposition: A | Discharge status: Home / Self Care | Payer: Medicare PPO | Source: Ambulatory Visit | Attending: Radiation Oncology | Admitting: Radiation Oncology

## 2021-07-03 DIAGNOSIS — C61 Malignant neoplasm of prostate: Secondary | ICD-10-CM | POA: Diagnosis not present

## 2021-07-03 DIAGNOSIS — Z510 Encounter for antineoplastic radiation therapy: Secondary | ICD-10-CM | POA: Diagnosis not present

## 2021-07-03 LAB — RAD ONC ARIA SESSION SUMMARY
Course Elapsed Days: 7
Plan Fractions Treated to Date: 6
Plan Prescribed Dose Per Fraction: 2 Gy
Plan Total Fractions Prescribed: 40
Plan Total Prescribed Dose: 80 Gy
Reference Point Dosage Given to Date: 12 Gy
Reference Point Session Dosage Given: 2 Gy
Session Number: 6

## 2021-07-04 ENCOUNTER — Ambulatory Visit
Admission: RE | Admit: 2021-07-04 | Discharge: 2021-07-04 | Disposition: A | Discharge status: Home / Self Care | Payer: Medicare PPO | Source: Ambulatory Visit | Attending: Radiation Oncology | Admitting: Radiation Oncology

## 2021-07-04 ENCOUNTER — Other Ambulatory Visit: Payer: Self-pay

## 2021-07-04 DIAGNOSIS — Z510 Encounter for antineoplastic radiation therapy: Secondary | ICD-10-CM | POA: Diagnosis not present

## 2021-07-04 DIAGNOSIS — C61 Malignant neoplasm of prostate: Secondary | ICD-10-CM | POA: Diagnosis not present

## 2021-07-04 LAB — RAD ONC ARIA SESSION SUMMARY
Course Elapsed Days: 8
Plan Fractions Treated to Date: 7
Plan Prescribed Dose Per Fraction: 2 Gy
Plan Total Fractions Prescribed: 40
Plan Total Prescribed Dose: 80 Gy
Reference Point Dosage Given to Date: 14 Gy
Reference Point Session Dosage Given: 2 Gy
Session Number: 7

## 2021-07-05 ENCOUNTER — Other Ambulatory Visit: Payer: Self-pay

## 2021-07-05 ENCOUNTER — Ambulatory Visit
Admission: RE | Admit: 2021-07-05 | Discharge: 2021-07-05 | Disposition: A | Discharge status: Home / Self Care | Payer: Medicare PPO | Source: Ambulatory Visit | Attending: Radiation Oncology | Admitting: Radiation Oncology

## 2021-07-05 DIAGNOSIS — C61 Malignant neoplasm of prostate: Secondary | ICD-10-CM | POA: Diagnosis not present

## 2021-07-05 DIAGNOSIS — Z510 Encounter for antineoplastic radiation therapy: Secondary | ICD-10-CM | POA: Diagnosis not present

## 2021-07-05 LAB — RAD ONC ARIA SESSION SUMMARY
Course Elapsed Days: 9
Plan Fractions Treated to Date: 8
Plan Prescribed Dose Per Fraction: 2 Gy
Plan Total Fractions Prescribed: 40
Plan Total Prescribed Dose: 80 Gy
Reference Point Dosage Given to Date: 16 Gy
Reference Point Session Dosage Given: 2 Gy
Session Number: 8

## 2021-07-06 ENCOUNTER — Ambulatory Visit
Admission: RE | Admit: 2021-07-06 | Discharge: 2021-07-06 | Disposition: A | Discharge status: Home / Self Care | Payer: Medicare PPO | Source: Ambulatory Visit | Attending: Radiation Oncology | Admitting: Radiation Oncology

## 2021-07-06 ENCOUNTER — Other Ambulatory Visit: Payer: Self-pay

## 2021-07-06 DIAGNOSIS — Z510 Encounter for antineoplastic radiation therapy: Secondary | ICD-10-CM | POA: Diagnosis not present

## 2021-07-06 DIAGNOSIS — C61 Malignant neoplasm of prostate: Secondary | ICD-10-CM | POA: Diagnosis not present

## 2021-07-06 LAB — RAD ONC ARIA SESSION SUMMARY
Course Elapsed Days: 10
Plan Fractions Treated to Date: 9
Plan Prescribed Dose Per Fraction: 2 Gy
Plan Total Fractions Prescribed: 40
Plan Total Prescribed Dose: 80 Gy
Reference Point Dosage Given to Date: 18 Gy
Reference Point Session Dosage Given: 2 Gy
Session Number: 9

## 2021-07-07 ENCOUNTER — Other Ambulatory Visit: Payer: Self-pay

## 2021-07-07 ENCOUNTER — Ambulatory Visit
Admission: RE | Admit: 2021-07-07 | Discharge: 2021-07-07 | Disposition: A | Discharge status: Home / Self Care | Payer: Medicare PPO | Source: Ambulatory Visit | Attending: Radiation Oncology | Admitting: Radiation Oncology

## 2021-07-07 DIAGNOSIS — Z510 Encounter for antineoplastic radiation therapy: Secondary | ICD-10-CM | POA: Diagnosis not present

## 2021-07-07 DIAGNOSIS — C61 Malignant neoplasm of prostate: Secondary | ICD-10-CM | POA: Diagnosis not present

## 2021-07-07 LAB — RAD ONC ARIA SESSION SUMMARY
Course Elapsed Days: 11
Plan Fractions Treated to Date: 10
Plan Prescribed Dose Per Fraction: 2 Gy
Plan Total Fractions Prescribed: 40
Plan Total Prescribed Dose: 80 Gy
Reference Point Dosage Given to Date: 20 Gy
Reference Point Session Dosage Given: 2 Gy
Session Number: 10

## 2021-07-10 ENCOUNTER — Encounter: Payer: Self-pay | Admitting: Internal Medicine

## 2021-07-10 ENCOUNTER — Other Ambulatory Visit: Payer: Self-pay

## 2021-07-10 ENCOUNTER — Ambulatory Visit
Admission: RE | Admit: 2021-07-10 | Discharge: 2021-07-10 | Disposition: A | Discharge status: Home / Self Care | Payer: Medicare PPO | Source: Ambulatory Visit | Attending: Radiation Oncology | Admitting: Radiation Oncology

## 2021-07-10 DIAGNOSIS — Z510 Encounter for antineoplastic radiation therapy: Secondary | ICD-10-CM | POA: Diagnosis not present

## 2021-07-10 DIAGNOSIS — C61 Malignant neoplasm of prostate: Secondary | ICD-10-CM | POA: Diagnosis not present

## 2021-07-10 LAB — RAD ONC ARIA SESSION SUMMARY
Course Elapsed Days: 14
Plan Fractions Treated to Date: 11
Plan Prescribed Dose Per Fraction: 2 Gy
Plan Total Fractions Prescribed: 40
Plan Total Prescribed Dose: 80 Gy
Reference Point Dosage Given to Date: 22 Gy
Reference Point Session Dosage Given: 2 Gy
Session Number: 11

## 2021-07-11 ENCOUNTER — Ambulatory Visit
Admission: RE | Admit: 2021-07-11 | Discharge: 2021-07-11 | Disposition: A | Discharge status: Home / Self Care | Payer: Medicare PPO | Source: Ambulatory Visit | Attending: Radiation Oncology | Admitting: Radiation Oncology

## 2021-07-11 ENCOUNTER — Other Ambulatory Visit: Payer: Self-pay

## 2021-07-11 DIAGNOSIS — M5033 Other cervical disc degeneration, cervicothoracic region: Secondary | ICD-10-CM | POA: Diagnosis not present

## 2021-07-11 DIAGNOSIS — C61 Malignant neoplasm of prostate: Secondary | ICD-10-CM | POA: Diagnosis not present

## 2021-07-11 DIAGNOSIS — M5136 Other intervertebral disc degeneration, lumbar region: Secondary | ICD-10-CM | POA: Diagnosis not present

## 2021-07-11 DIAGNOSIS — M9903 Segmental and somatic dysfunction of lumbar region: Secondary | ICD-10-CM | POA: Diagnosis not present

## 2021-07-11 DIAGNOSIS — Z510 Encounter for antineoplastic radiation therapy: Secondary | ICD-10-CM | POA: Diagnosis not present

## 2021-07-11 DIAGNOSIS — M9901 Segmental and somatic dysfunction of cervical region: Secondary | ICD-10-CM | POA: Diagnosis not present

## 2021-07-11 LAB — RAD ONC ARIA SESSION SUMMARY
Course Elapsed Days: 15
Plan Fractions Treated to Date: 12
Plan Prescribed Dose Per Fraction: 2 Gy
Plan Total Fractions Prescribed: 40
Plan Total Prescribed Dose: 80 Gy
Reference Point Dosage Given to Date: 24 Gy
Reference Point Session Dosage Given: 2 Gy
Session Number: 12

## 2021-07-12 ENCOUNTER — Inpatient Hospital Stay: Payer: Medicare PPO

## 2021-07-12 ENCOUNTER — Ambulatory Visit
Admission: RE | Admit: 2021-07-12 | Discharge: 2021-07-12 | Disposition: A | Discharge status: Home / Self Care | Payer: Medicare PPO | Source: Ambulatory Visit | Attending: Radiation Oncology | Admitting: Radiation Oncology

## 2021-07-12 ENCOUNTER — Other Ambulatory Visit: Payer: Self-pay

## 2021-07-12 DIAGNOSIS — C61 Malignant neoplasm of prostate: Secondary | ICD-10-CM

## 2021-07-12 DIAGNOSIS — Z510 Encounter for antineoplastic radiation therapy: Secondary | ICD-10-CM | POA: Diagnosis not present

## 2021-07-12 LAB — CBC
HCT: 39.1 % (ref 39.0–52.0)
Hemoglobin: 13.2 g/dL (ref 13.0–17.0)
MCH: 30 pg (ref 26.0–34.0)
MCHC: 33.8 g/dL (ref 30.0–36.0)
MCV: 88.9 fL (ref 80.0–100.0)
Platelets: 120 10*3/uL — ABNORMAL LOW (ref 150–400)
RBC: 4.4 MIL/uL (ref 4.22–5.81)
RDW: 13.8 % (ref 11.5–15.5)
WBC: 5.8 10*3/uL (ref 4.0–10.5)
nRBC: 0 % (ref 0.0–0.2)

## 2021-07-12 LAB — RAD ONC ARIA SESSION SUMMARY
Course Elapsed Days: 16
Plan Fractions Treated to Date: 13
Plan Prescribed Dose Per Fraction: 2 Gy
Plan Total Fractions Prescribed: 40
Plan Total Prescribed Dose: 80 Gy
Reference Point Dosage Given to Date: 26 Gy
Reference Point Session Dosage Given: 2 Gy
Session Number: 13

## 2021-07-13 ENCOUNTER — Ambulatory Visit
Admission: RE | Admit: 2021-07-13 | Discharge: 2021-07-13 | Disposition: A | Discharge status: Home / Self Care | Payer: Medicare PPO | Source: Ambulatory Visit | Attending: Radiation Oncology | Admitting: Radiation Oncology

## 2021-07-13 ENCOUNTER — Other Ambulatory Visit: Payer: Self-pay

## 2021-07-13 DIAGNOSIS — C61 Malignant neoplasm of prostate: Secondary | ICD-10-CM | POA: Diagnosis not present

## 2021-07-13 DIAGNOSIS — Z510 Encounter for antineoplastic radiation therapy: Secondary | ICD-10-CM | POA: Diagnosis not present

## 2021-07-13 LAB — RAD ONC ARIA SESSION SUMMARY
Course Elapsed Days: 17
Plan Fractions Treated to Date: 14
Plan Prescribed Dose Per Fraction: 2 Gy
Plan Total Fractions Prescribed: 40
Plan Total Prescribed Dose: 80 Gy
Reference Point Dosage Given to Date: 28 Gy
Reference Point Session Dosage Given: 2 Gy
Session Number: 14

## 2021-07-14 ENCOUNTER — Other Ambulatory Visit: Payer: Self-pay

## 2021-07-14 ENCOUNTER — Ambulatory Visit
Admission: RE | Admit: 2021-07-14 | Discharge: 2021-07-14 | Disposition: A | Discharge status: Home / Self Care | Payer: Medicare PPO | Source: Ambulatory Visit | Attending: Radiation Oncology | Admitting: Radiation Oncology

## 2021-07-14 DIAGNOSIS — Z510 Encounter for antineoplastic radiation therapy: Secondary | ICD-10-CM | POA: Diagnosis not present

## 2021-07-14 DIAGNOSIS — C61 Malignant neoplasm of prostate: Secondary | ICD-10-CM | POA: Diagnosis not present

## 2021-07-14 LAB — RAD ONC ARIA SESSION SUMMARY
Course Elapsed Days: 18
Plan Fractions Treated to Date: 15
Plan Prescribed Dose Per Fraction: 2 Gy
Plan Total Fractions Prescribed: 40
Plan Total Prescribed Dose: 80 Gy
Reference Point Dosage Given to Date: 30 Gy
Reference Point Session Dosage Given: 2 Gy
Session Number: 15

## 2021-07-17 ENCOUNTER — Other Ambulatory Visit: Payer: Self-pay

## 2021-07-17 ENCOUNTER — Ambulatory Visit
Admission: RE | Admit: 2021-07-17 | Discharge: 2021-07-17 | Disposition: A | Discharge status: Home / Self Care | Payer: Medicare PPO | Source: Ambulatory Visit | Attending: Radiation Oncology | Admitting: Radiation Oncology

## 2021-07-17 DIAGNOSIS — C61 Malignant neoplasm of prostate: Secondary | ICD-10-CM | POA: Diagnosis not present

## 2021-07-17 DIAGNOSIS — Z510 Encounter for antineoplastic radiation therapy: Secondary | ICD-10-CM | POA: Diagnosis not present

## 2021-07-17 LAB — RAD ONC ARIA SESSION SUMMARY
Course Elapsed Days: 21
Plan Fractions Treated to Date: 16
Plan Prescribed Dose Per Fraction: 2 Gy
Plan Total Fractions Prescribed: 40
Plan Total Prescribed Dose: 80 Gy
Reference Point Dosage Given to Date: 32 Gy
Reference Point Session Dosage Given: 2 Gy
Session Number: 16

## 2021-07-18 ENCOUNTER — Ambulatory Visit
Admission: RE | Admit: 2021-07-18 | Discharge: 2021-07-18 | Disposition: A | Discharge status: Home / Self Care | Payer: Medicare PPO | Source: Ambulatory Visit | Attending: Radiation Oncology | Admitting: Radiation Oncology

## 2021-07-18 ENCOUNTER — Other Ambulatory Visit: Payer: Self-pay

## 2021-07-18 DIAGNOSIS — C61 Malignant neoplasm of prostate: Secondary | ICD-10-CM | POA: Diagnosis not present

## 2021-07-18 DIAGNOSIS — Z510 Encounter for antineoplastic radiation therapy: Secondary | ICD-10-CM | POA: Diagnosis not present

## 2021-07-18 LAB — RAD ONC ARIA SESSION SUMMARY
Course Elapsed Days: 22
Plan Fractions Treated to Date: 17
Plan Prescribed Dose Per Fraction: 2 Gy
Plan Total Fractions Prescribed: 40
Plan Total Prescribed Dose: 80 Gy
Reference Point Dosage Given to Date: 34 Gy
Reference Point Session Dosage Given: 2 Gy
Session Number: 17

## 2021-07-19 ENCOUNTER — Other Ambulatory Visit: Payer: Self-pay

## 2021-07-19 ENCOUNTER — Ambulatory Visit
Admission: RE | Admit: 2021-07-19 | Discharge: 2021-07-19 | Disposition: A | Discharge status: Home / Self Care | Payer: Medicare PPO | Source: Ambulatory Visit | Attending: Radiation Oncology | Admitting: Radiation Oncology

## 2021-07-19 DIAGNOSIS — Z510 Encounter for antineoplastic radiation therapy: Secondary | ICD-10-CM | POA: Diagnosis not present

## 2021-07-19 DIAGNOSIS — C61 Malignant neoplasm of prostate: Secondary | ICD-10-CM | POA: Diagnosis not present

## 2021-07-19 LAB — RAD ONC ARIA SESSION SUMMARY
Course Elapsed Days: 23
Plan Fractions Treated to Date: 18
Plan Prescribed Dose Per Fraction: 2 Gy
Plan Total Fractions Prescribed: 40
Plan Total Prescribed Dose: 80 Gy
Reference Point Dosage Given to Date: 36 Gy
Reference Point Session Dosage Given: 2 Gy
Session Number: 18

## 2021-07-20 ENCOUNTER — Other Ambulatory Visit: Payer: Self-pay

## 2021-07-20 ENCOUNTER — Ambulatory Visit
Admission: RE | Admit: 2021-07-20 | Discharge: 2021-07-20 | Disposition: A | Discharge status: Home / Self Care | Payer: Medicare PPO | Source: Ambulatory Visit | Attending: Radiation Oncology | Admitting: Radiation Oncology

## 2021-07-20 DIAGNOSIS — C61 Malignant neoplasm of prostate: Secondary | ICD-10-CM | POA: Diagnosis not present

## 2021-07-20 DIAGNOSIS — Z510 Encounter for antineoplastic radiation therapy: Secondary | ICD-10-CM | POA: Diagnosis not present

## 2021-07-20 LAB — RAD ONC ARIA SESSION SUMMARY
Course Elapsed Days: 24
Plan Fractions Treated to Date: 19
Plan Prescribed Dose Per Fraction: 2 Gy
Plan Total Fractions Prescribed: 40
Plan Total Prescribed Dose: 80 Gy
Reference Point Dosage Given to Date: 38 Gy
Reference Point Session Dosage Given: 2 Gy
Session Number: 19

## 2021-07-21 ENCOUNTER — Ambulatory Visit
Admission: RE | Admit: 2021-07-21 | Discharge: 2021-07-21 | Disposition: A | Discharge status: Home / Self Care | Payer: Medicare PPO | Source: Ambulatory Visit | Attending: Radiation Oncology | Admitting: Radiation Oncology

## 2021-07-21 ENCOUNTER — Other Ambulatory Visit: Payer: Self-pay

## 2021-07-21 DIAGNOSIS — C61 Malignant neoplasm of prostate: Secondary | ICD-10-CM | POA: Diagnosis not present

## 2021-07-21 DIAGNOSIS — Z510 Encounter for antineoplastic radiation therapy: Secondary | ICD-10-CM | POA: Diagnosis not present

## 2021-07-21 LAB — RAD ONC ARIA SESSION SUMMARY
Course Elapsed Days: 25
Plan Fractions Treated to Date: 20
Plan Prescribed Dose Per Fraction: 2 Gy
Plan Total Fractions Prescribed: 40
Plan Total Prescribed Dose: 80 Gy
Reference Point Dosage Given to Date: 40 Gy
Reference Point Session Dosage Given: 2 Gy
Session Number: 20

## 2021-07-24 ENCOUNTER — Ambulatory Visit
Admission: RE | Admit: 2021-07-24 | Discharge: 2021-07-24 | Disposition: A | Discharge status: Home / Self Care | Payer: Medicare PPO | Source: Ambulatory Visit | Attending: Radiation Oncology | Admitting: Radiation Oncology

## 2021-07-24 ENCOUNTER — Other Ambulatory Visit: Payer: Self-pay

## 2021-07-24 DIAGNOSIS — C61 Malignant neoplasm of prostate: Secondary | ICD-10-CM | POA: Diagnosis not present

## 2021-07-24 DIAGNOSIS — Z510 Encounter for antineoplastic radiation therapy: Secondary | ICD-10-CM | POA: Diagnosis not present

## 2021-07-24 LAB — RAD ONC ARIA SESSION SUMMARY
Course Elapsed Days: 28
Plan Fractions Treated to Date: 21
Plan Prescribed Dose Per Fraction: 2 Gy
Plan Total Fractions Prescribed: 40
Plan Total Prescribed Dose: 80 Gy
Reference Point Dosage Given to Date: 42 Gy
Reference Point Session Dosage Given: 2 Gy
Session Number: 21

## 2021-07-25 ENCOUNTER — Ambulatory Visit
Admission: RE | Admit: 2021-07-25 | Discharge: 2021-07-25 | Disposition: A | Discharge status: Home / Self Care | Payer: Medicare PPO | Source: Ambulatory Visit | Attending: Radiation Oncology | Admitting: Radiation Oncology

## 2021-07-25 ENCOUNTER — Other Ambulatory Visit: Payer: Self-pay

## 2021-07-25 DIAGNOSIS — C61 Malignant neoplasm of prostate: Secondary | ICD-10-CM | POA: Diagnosis not present

## 2021-07-25 DIAGNOSIS — Z510 Encounter for antineoplastic radiation therapy: Secondary | ICD-10-CM | POA: Diagnosis not present

## 2021-07-25 LAB — RAD ONC ARIA SESSION SUMMARY
Course Elapsed Days: 29
Plan Fractions Treated to Date: 22
Plan Prescribed Dose Per Fraction: 2 Gy
Plan Total Fractions Prescribed: 40
Plan Total Prescribed Dose: 80 Gy
Reference Point Dosage Given to Date: 44 Gy
Reference Point Session Dosage Given: 2 Gy
Session Number: 22

## 2021-07-26 ENCOUNTER — Ambulatory Visit
Admission: RE | Admit: 2021-07-26 | Discharge: 2021-07-26 | Disposition: A | Discharge status: Home / Self Care | Payer: Medicare PPO | Source: Ambulatory Visit | Attending: Radiation Oncology | Admitting: Radiation Oncology

## 2021-07-26 ENCOUNTER — Other Ambulatory Visit: Payer: Self-pay

## 2021-07-26 ENCOUNTER — Inpatient Hospital Stay: Payer: Medicare PPO

## 2021-07-26 DIAGNOSIS — C61 Malignant neoplasm of prostate: Secondary | ICD-10-CM

## 2021-07-26 DIAGNOSIS — Z510 Encounter for antineoplastic radiation therapy: Secondary | ICD-10-CM | POA: Diagnosis not present

## 2021-07-26 LAB — CBC
HCT: 38.8 % — ABNORMAL LOW (ref 39.0–52.0)
Hemoglobin: 13.5 g/dL (ref 13.0–17.0)
MCH: 30.8 pg (ref 26.0–34.0)
MCHC: 34.8 g/dL (ref 30.0–36.0)
MCV: 88.6 fL (ref 80.0–100.0)
Platelets: 189 10*3/uL (ref 150–400)
RBC: 4.38 MIL/uL (ref 4.22–5.81)
RDW: 14.5 % (ref 11.5–15.5)
WBC: 5.5 10*3/uL (ref 4.0–10.5)
nRBC: 0 % (ref 0.0–0.2)

## 2021-07-26 LAB — RAD ONC ARIA SESSION SUMMARY
Course Elapsed Days: 30
Plan Fractions Treated to Date: 23
Plan Prescribed Dose Per Fraction: 2 Gy
Plan Total Fractions Prescribed: 40
Plan Total Prescribed Dose: 80 Gy
Reference Point Dosage Given to Date: 46 Gy
Reference Point Session Dosage Given: 2 Gy
Session Number: 23

## 2021-07-27 ENCOUNTER — Ambulatory Visit
Admission: RE | Admit: 2021-07-27 | Discharge: 2021-07-27 | Disposition: A | Discharge status: Home / Self Care | Payer: Medicare PPO | Source: Ambulatory Visit | Attending: Radiation Oncology | Admitting: Radiation Oncology

## 2021-07-27 ENCOUNTER — Other Ambulatory Visit: Payer: Self-pay

## 2021-07-27 DIAGNOSIS — C61 Malignant neoplasm of prostate: Secondary | ICD-10-CM | POA: Diagnosis not present

## 2021-07-27 DIAGNOSIS — Z510 Encounter for antineoplastic radiation therapy: Secondary | ICD-10-CM | POA: Diagnosis not present

## 2021-07-27 LAB — RAD ONC ARIA SESSION SUMMARY
Course Elapsed Days: 31
Plan Fractions Treated to Date: 24
Plan Prescribed Dose Per Fraction: 2 Gy
Plan Total Fractions Prescribed: 40
Plan Total Prescribed Dose: 80 Gy
Reference Point Dosage Given to Date: 48 Gy
Reference Point Session Dosage Given: 2 Gy
Session Number: 24

## 2021-07-28 ENCOUNTER — Other Ambulatory Visit: Payer: Self-pay

## 2021-07-28 ENCOUNTER — Ambulatory Visit
Admission: RE | Admit: 2021-07-28 | Discharge: 2021-07-28 | Disposition: A | Discharge status: Home / Self Care | Payer: Medicare PPO | Source: Ambulatory Visit | Attending: Radiation Oncology | Admitting: Radiation Oncology

## 2021-07-28 DIAGNOSIS — Z510 Encounter for antineoplastic radiation therapy: Secondary | ICD-10-CM | POA: Diagnosis not present

## 2021-07-28 DIAGNOSIS — C61 Malignant neoplasm of prostate: Secondary | ICD-10-CM | POA: Diagnosis not present

## 2021-07-28 LAB — RAD ONC ARIA SESSION SUMMARY
Course Elapsed Days: 32
Plan Fractions Treated to Date: 25
Plan Prescribed Dose Per Fraction: 2 Gy
Plan Total Fractions Prescribed: 40
Plan Total Prescribed Dose: 80 Gy
Reference Point Dosage Given to Date: 50 Gy
Reference Point Session Dosage Given: 2 Gy
Session Number: 25

## 2021-08-01 ENCOUNTER — Other Ambulatory Visit: Payer: Self-pay

## 2021-08-01 ENCOUNTER — Ambulatory Visit
Admission: RE | Admit: 2021-08-01 | Discharge: 2021-08-01 | Disposition: A | Discharge status: Home / Self Care | Payer: Medicare PPO | Source: Ambulatory Visit | Attending: Radiation Oncology | Admitting: Radiation Oncology

## 2021-08-01 DIAGNOSIS — C61 Malignant neoplasm of prostate: Secondary | ICD-10-CM | POA: Diagnosis not present

## 2021-08-01 DIAGNOSIS — Z510 Encounter for antineoplastic radiation therapy: Secondary | ICD-10-CM | POA: Diagnosis not present

## 2021-08-01 LAB — RAD ONC ARIA SESSION SUMMARY
Course Elapsed Days: 36
Plan Fractions Treated to Date: 26
Plan Prescribed Dose Per Fraction: 2 Gy
Plan Total Fractions Prescribed: 40
Plan Total Prescribed Dose: 80 Gy
Reference Point Dosage Given to Date: 52 Gy
Reference Point Session Dosage Given: 2 Gy
Session Number: 26

## 2021-08-02 ENCOUNTER — Ambulatory Visit
Admission: RE | Admit: 2021-08-02 | Discharge: 2021-08-02 | Disposition: A | Discharge status: Home / Self Care | Payer: Medicare PPO | Source: Ambulatory Visit | Attending: Radiation Oncology | Admitting: Radiation Oncology

## 2021-08-02 ENCOUNTER — Other Ambulatory Visit: Payer: Self-pay

## 2021-08-02 DIAGNOSIS — Z510 Encounter for antineoplastic radiation therapy: Secondary | ICD-10-CM | POA: Diagnosis not present

## 2021-08-02 DIAGNOSIS — C61 Malignant neoplasm of prostate: Secondary | ICD-10-CM | POA: Diagnosis not present

## 2021-08-02 LAB — RAD ONC ARIA SESSION SUMMARY
Course Elapsed Days: 37
Plan Fractions Treated to Date: 27
Plan Prescribed Dose Per Fraction: 2 Gy
Plan Total Fractions Prescribed: 40
Plan Total Prescribed Dose: 80 Gy
Reference Point Dosage Given to Date: 54 Gy
Reference Point Session Dosage Given: 2 Gy
Session Number: 27

## 2021-08-03 ENCOUNTER — Other Ambulatory Visit: Payer: Self-pay | Admitting: Family Medicine

## 2021-08-03 ENCOUNTER — Ambulatory Visit
Admission: RE | Admit: 2021-08-03 | Discharge: 2021-08-03 | Disposition: A | Discharge status: Home / Self Care | Payer: Medicare PPO | Source: Ambulatory Visit | Attending: Radiation Oncology | Admitting: Radiation Oncology

## 2021-08-03 ENCOUNTER — Other Ambulatory Visit: Payer: Self-pay

## 2021-08-03 DIAGNOSIS — Z510 Encounter for antineoplastic radiation therapy: Secondary | ICD-10-CM | POA: Diagnosis not present

## 2021-08-03 DIAGNOSIS — C61 Malignant neoplasm of prostate: Secondary | ICD-10-CM | POA: Diagnosis not present

## 2021-08-03 LAB — RAD ONC ARIA SESSION SUMMARY
Course Elapsed Days: 38
Plan Fractions Treated to Date: 28
Plan Prescribed Dose Per Fraction: 2 Gy
Plan Total Fractions Prescribed: 40
Plan Total Prescribed Dose: 80 Gy
Reference Point Dosage Given to Date: 56 Gy
Reference Point Session Dosage Given: 2 Gy
Session Number: 28

## 2021-08-04 ENCOUNTER — Ambulatory Visit
Admission: RE | Admit: 2021-08-04 | Discharge: 2021-08-04 | Disposition: A | Discharge status: Home / Self Care | Payer: Medicare PPO | Source: Ambulatory Visit | Attending: Radiation Oncology | Admitting: Radiation Oncology

## 2021-08-04 ENCOUNTER — Other Ambulatory Visit: Payer: Self-pay

## 2021-08-04 DIAGNOSIS — C61 Malignant neoplasm of prostate: Secondary | ICD-10-CM | POA: Diagnosis not present

## 2021-08-04 DIAGNOSIS — Z510 Encounter for antineoplastic radiation therapy: Secondary | ICD-10-CM | POA: Diagnosis not present

## 2021-08-04 LAB — RAD ONC ARIA SESSION SUMMARY
Course Elapsed Days: 39
Plan Fractions Treated to Date: 29
Plan Prescribed Dose Per Fraction: 2 Gy
Plan Total Fractions Prescribed: 40
Plan Total Prescribed Dose: 80 Gy
Reference Point Dosage Given to Date: 58 Gy
Reference Point Session Dosage Given: 2 Gy
Session Number: 29

## 2021-08-07 ENCOUNTER — Other Ambulatory Visit: Payer: Self-pay

## 2021-08-07 ENCOUNTER — Ambulatory Visit
Admission: RE | Admit: 2021-08-07 | Discharge: 2021-08-07 | Disposition: A | Discharge status: Home / Self Care | Payer: Medicare PPO | Source: Ambulatory Visit | Attending: Radiation Oncology | Admitting: Radiation Oncology

## 2021-08-07 DIAGNOSIS — C61 Malignant neoplasm of prostate: Secondary | ICD-10-CM | POA: Diagnosis not present

## 2021-08-07 DIAGNOSIS — Z510 Encounter for antineoplastic radiation therapy: Secondary | ICD-10-CM | POA: Diagnosis not present

## 2021-08-07 LAB — RAD ONC ARIA SESSION SUMMARY
Course Elapsed Days: 42
Plan Fractions Treated to Date: 30
Plan Prescribed Dose Per Fraction: 2 Gy
Plan Total Fractions Prescribed: 40
Plan Total Prescribed Dose: 80 Gy
Reference Point Dosage Given to Date: 60 Gy
Reference Point Session Dosage Given: 2 Gy
Session Number: 30

## 2021-08-08 ENCOUNTER — Ambulatory Visit
Admission: RE | Admit: 2021-08-08 | Discharge: 2021-08-08 | Disposition: A | Discharge status: Home / Self Care | Payer: Medicare PPO | Source: Ambulatory Visit | Attending: Radiation Oncology | Admitting: Radiation Oncology

## 2021-08-08 ENCOUNTER — Other Ambulatory Visit: Payer: Self-pay

## 2021-08-08 DIAGNOSIS — M5033 Other cervical disc degeneration, cervicothoracic region: Secondary | ICD-10-CM | POA: Diagnosis not present

## 2021-08-08 DIAGNOSIS — C61 Malignant neoplasm of prostate: Secondary | ICD-10-CM | POA: Diagnosis not present

## 2021-08-08 DIAGNOSIS — Z510 Encounter for antineoplastic radiation therapy: Secondary | ICD-10-CM | POA: Diagnosis not present

## 2021-08-08 DIAGNOSIS — M9903 Segmental and somatic dysfunction of lumbar region: Secondary | ICD-10-CM | POA: Diagnosis not present

## 2021-08-08 DIAGNOSIS — M5136 Other intervertebral disc degeneration, lumbar region: Secondary | ICD-10-CM | POA: Diagnosis not present

## 2021-08-08 DIAGNOSIS — M9901 Segmental and somatic dysfunction of cervical region: Secondary | ICD-10-CM | POA: Diagnosis not present

## 2021-08-08 LAB — RAD ONC ARIA SESSION SUMMARY
Course Elapsed Days: 43
Plan Fractions Treated to Date: 31
Plan Prescribed Dose Per Fraction: 2 Gy
Plan Total Fractions Prescribed: 40
Plan Total Prescribed Dose: 80 Gy
Reference Point Dosage Given to Date: 62 Gy
Reference Point Session Dosage Given: 2 Gy
Session Number: 31

## 2021-08-09 ENCOUNTER — Inpatient Hospital Stay: Payer: Medicare PPO | Attending: Internal Medicine

## 2021-08-09 ENCOUNTER — Other Ambulatory Visit: Payer: Self-pay

## 2021-08-09 ENCOUNTER — Ambulatory Visit
Admission: RE | Admit: 2021-08-09 | Discharge: 2021-08-09 | Disposition: A | Discharge status: Home / Self Care | Payer: Medicare PPO | Source: Ambulatory Visit | Attending: Radiation Oncology | Admitting: Radiation Oncology

## 2021-08-09 DIAGNOSIS — C61 Malignant neoplasm of prostate: Secondary | ICD-10-CM | POA: Diagnosis not present

## 2021-08-09 DIAGNOSIS — Z510 Encounter for antineoplastic radiation therapy: Secondary | ICD-10-CM | POA: Diagnosis not present

## 2021-08-09 LAB — CBC
HCT: 39.2 % (ref 39.0–52.0)
Hemoglobin: 13.3 g/dL (ref 13.0–17.0)
MCH: 30.5 pg (ref 26.0–34.0)
MCHC: 33.9 g/dL (ref 30.0–36.0)
MCV: 89.9 fL (ref 80.0–100.0)
Platelets: 158 10*3/uL (ref 150–400)
RBC: 4.36 MIL/uL (ref 4.22–5.81)
RDW: 14.8 % (ref 11.5–15.5)
WBC: 5.8 10*3/uL (ref 4.0–10.5)
nRBC: 0 % (ref 0.0–0.2)

## 2021-08-09 LAB — RAD ONC ARIA SESSION SUMMARY
Course Elapsed Days: 44
Plan Fractions Treated to Date: 32
Plan Prescribed Dose Per Fraction: 2 Gy
Plan Total Fractions Prescribed: 40
Plan Total Prescribed Dose: 80 Gy
Reference Point Dosage Given to Date: 64 Gy
Reference Point Session Dosage Given: 2 Gy
Session Number: 32

## 2021-08-10 ENCOUNTER — Ambulatory Visit
Admission: RE | Admit: 2021-08-10 | Discharge: 2021-08-10 | Disposition: A | Discharge status: Home / Self Care | Payer: Medicare PPO | Source: Ambulatory Visit | Attending: Radiation Oncology | Admitting: Radiation Oncology

## 2021-08-10 ENCOUNTER — Other Ambulatory Visit: Payer: Self-pay

## 2021-08-10 DIAGNOSIS — C61 Malignant neoplasm of prostate: Secondary | ICD-10-CM | POA: Diagnosis not present

## 2021-08-10 DIAGNOSIS — Z510 Encounter for antineoplastic radiation therapy: Secondary | ICD-10-CM | POA: Diagnosis not present

## 2021-08-10 LAB — RAD ONC ARIA SESSION SUMMARY
Course Elapsed Days: 45
Plan Fractions Treated to Date: 33
Plan Prescribed Dose Per Fraction: 2 Gy
Plan Total Fractions Prescribed: 40
Plan Total Prescribed Dose: 80 Gy
Reference Point Dosage Given to Date: 66 Gy
Reference Point Session Dosage Given: 2 Gy
Session Number: 33

## 2021-08-11 ENCOUNTER — Ambulatory Visit
Admission: RE | Admit: 2021-08-11 | Discharge: 2021-08-11 | Disposition: A | Discharge status: Home / Self Care | Payer: Medicare PPO | Source: Ambulatory Visit | Attending: Radiation Oncology | Admitting: Radiation Oncology

## 2021-08-11 ENCOUNTER — Other Ambulatory Visit: Payer: Self-pay

## 2021-08-11 DIAGNOSIS — Z510 Encounter for antineoplastic radiation therapy: Secondary | ICD-10-CM | POA: Diagnosis not present

## 2021-08-11 DIAGNOSIS — C61 Malignant neoplasm of prostate: Secondary | ICD-10-CM | POA: Diagnosis not present

## 2021-08-11 LAB — RAD ONC ARIA SESSION SUMMARY
Course Elapsed Days: 46
Plan Fractions Treated to Date: 34
Plan Prescribed Dose Per Fraction: 2 Gy
Plan Total Fractions Prescribed: 40
Plan Total Prescribed Dose: 80 Gy
Reference Point Dosage Given to Date: 68 Gy
Reference Point Session Dosage Given: 2 Gy
Session Number: 34

## 2021-08-14 ENCOUNTER — Other Ambulatory Visit: Payer: Self-pay

## 2021-08-14 ENCOUNTER — Ambulatory Visit
Admission: RE | Admit: 2021-08-14 | Discharge: 2021-08-14 | Disposition: A | Discharge status: Home / Self Care | Payer: Medicare PPO | Source: Ambulatory Visit | Attending: Radiation Oncology | Admitting: Radiation Oncology

## 2021-08-14 DIAGNOSIS — Z510 Encounter for antineoplastic radiation therapy: Secondary | ICD-10-CM | POA: Diagnosis not present

## 2021-08-14 DIAGNOSIS — C61 Malignant neoplasm of prostate: Secondary | ICD-10-CM | POA: Diagnosis not present

## 2021-08-14 LAB — RAD ONC ARIA SESSION SUMMARY
Course Elapsed Days: 49
Plan Fractions Treated to Date: 35
Plan Prescribed Dose Per Fraction: 2 Gy
Plan Total Fractions Prescribed: 40
Plan Total Prescribed Dose: 80 Gy
Reference Point Dosage Given to Date: 70 Gy
Reference Point Session Dosage Given: 2 Gy
Session Number: 35

## 2021-08-15 ENCOUNTER — Ambulatory Visit
Admission: RE | Admit: 2021-08-15 | Discharge: 2021-08-15 | Disposition: A | Discharge status: Home / Self Care | Payer: Medicare PPO | Source: Ambulatory Visit | Attending: Radiation Oncology | Admitting: Radiation Oncology

## 2021-08-15 ENCOUNTER — Other Ambulatory Visit: Payer: Self-pay

## 2021-08-15 ENCOUNTER — Ambulatory Visit: Payer: Medicare PPO | Admitting: Family Medicine

## 2021-08-15 ENCOUNTER — Encounter: Payer: Self-pay | Admitting: Family Medicine

## 2021-08-15 VITALS — BP 140/70 | HR 62 | Temp 98.0°F | Ht 69.0 in | Wt 260.0 lb

## 2021-08-15 DIAGNOSIS — R197 Diarrhea, unspecified: Secondary | ICD-10-CM | POA: Diagnosis not present

## 2021-08-15 DIAGNOSIS — K219 Gastro-esophageal reflux disease without esophagitis: Secondary | ICD-10-CM

## 2021-08-15 DIAGNOSIS — Z510 Encounter for antineoplastic radiation therapy: Secondary | ICD-10-CM | POA: Diagnosis not present

## 2021-08-15 DIAGNOSIS — R350 Frequency of micturition: Secondary | ICD-10-CM | POA: Diagnosis not present

## 2021-08-15 DIAGNOSIS — R600 Localized edema: Secondary | ICD-10-CM | POA: Diagnosis not present

## 2021-08-15 DIAGNOSIS — R432 Parageusia: Secondary | ICD-10-CM | POA: Insufficient documentation

## 2021-08-15 DIAGNOSIS — I10 Essential (primary) hypertension: Secondary | ICD-10-CM | POA: Diagnosis not present

## 2021-08-15 DIAGNOSIS — C61 Malignant neoplasm of prostate: Secondary | ICD-10-CM | POA: Diagnosis not present

## 2021-08-15 LAB — RAD ONC ARIA SESSION SUMMARY
Course Elapsed Days: 50
Plan Fractions Treated to Date: 36
Plan Prescribed Dose Per Fraction: 2 Gy
Plan Total Fractions Prescribed: 40
Plan Total Prescribed Dose: 80 Gy
Reference Point Dosage Given to Date: 72 Gy
Reference Point Session Dosage Given: 2 Gy
Session Number: 36

## 2021-08-15 LAB — POCT URINALYSIS DIPSTICK
Bilirubin, UA: NEGATIVE
Blood, UA: NEGATIVE
Glucose, UA: NEGATIVE
Ketones, UA: NEGATIVE
Leukocytes, UA: NEGATIVE
Nitrite, UA: NEGATIVE
Protein, UA: POSITIVE — AB
Spec Grav, UA: >=1.03 — AB (ref 1.010–1.025)
Urobilinogen, UA: 0.2 E.U./dL
pH, UA: 5 (ref 5.0–8.0)

## 2021-08-15 LAB — COMPREHENSIVE METABOLIC PANEL
ALT: 25 U/L (ref 0–53)
AST: 22 U/L (ref 0–37)
Albumin: 4.1 g/dL (ref 3.5–5.2)
Alkaline Phosphatase: 70 U/L (ref 39–117)
BUN: 17 mg/dL (ref 6–23)
CO2: 27 mEq/L (ref 19–32)
Calcium: 9.4 mg/dL (ref 8.4–10.5)
Chloride: 103 mEq/L (ref 96–112)
Creatinine, Ser: 1.27 mg/dL (ref 0.40–1.50)
GFR: 52.85 mL/min — ABNORMAL LOW (ref >60.00)
Glucose, Bld: 125 mg/dL — ABNORMAL HIGH (ref 70–99)
Potassium: 4 mEq/L (ref 3.5–5.1)
Sodium: 137 mEq/L (ref 135–145)
Total Bilirubin: 0.4 mg/dL (ref 0.2–1.2)
Total Protein: 7 g/dL (ref 6.0–8.3)

## 2021-08-15 LAB — TSH: TSH: 3.86 u[IU]/mL (ref 0.35–5.50)

## 2021-08-15 LAB — CBC
HCT: 38.9 % — ABNORMAL LOW (ref 39.0–52.0)
Hemoglobin: 13.1 g/dL (ref 13.0–17.0)
MCHC: 33.8 g/dL (ref 30.0–36.0)
MCV: 90.5 fl (ref 78.0–100.0)
Platelets: 169 10*3/uL (ref 150.0–400.0)
RBC: 4.3 Mil/uL (ref 4.22–5.81)
RDW: 17.2 % — ABNORMAL HIGH (ref 11.5–15.5)
WBC: 5 10*3/uL (ref 4.0–10.5)

## 2021-08-15 LAB — BRAIN NATRIURETIC PEPTIDE: Pro B Natriuretic peptide (BNP): 48 pg/mL (ref 0.0–100.0)

## 2021-08-15 NOTE — Patient Instructions (Signed)
Nice to see you. We will get lab work today and contact you with the results. 

## 2021-08-15 NOTE — Assessment & Plan Note (Signed)
This is likely related to his radiation therapy.  He will monitor and if it does not resolve after he completes his radiation he will let us know.

## 2021-08-15 NOTE — Assessment & Plan Note (Signed)
Likely a mix of venous insufficiency and related to his amlodipine.  We will obtain lab work and if negative for cause we will change his amlodipine to something different.

## 2021-08-15 NOTE — Assessment & Plan Note (Signed)
Patient has frequent urination along with dysuria and urgency since starting radiation therapy for his prostate cancer.  I suspect this is related to his radiation treatment though we will check a urinalysis.

## 2021-08-15 NOTE — Assessment & Plan Note (Signed)
This is likely related to COVID-19 as it started when he had COVID.  I discussed that there is not any specific treatment for this and that we would just have to monitor for improvement.

## 2021-08-15 NOTE — Progress Notes (Signed)
Tommi Rumps, MD Phone: [phone]  KRISTJAN DERNER is a 81 y.o. male who presents today for f/u.  HYPERTENSION Disease Monitoring Home BP Monitoring [id number] systolic at home, notes [id number] at the cancer center, though they check it as soon as he gets there and don't check it after him resting for a while Chest pain- no    Dyspnea- no Medications Compliance-  taking amlodipine, metoprolol  Edema- see below BMET    Component Value Date/Time   NA 137 06/08/2021 1104   NA 137 09/01/2013 0410   K 3.8 06/08/2021 1104   K 3.6 09/01/2013 0410   CL 101 06/08/2021 1104   CL 106 09/01/2013 0410   CO2 26 06/08/2021 1104   CO2 23 09/01/2013 0410   GLUCOSE 110 (H) 06/08/2021 1104   GLUCOSE 107 (H) 09/01/2013 0410   BUN 19 06/08/2021 1104   BUN 13 06/24/2017 1039   BUN 14 09/01/2013 0410   CREATININE 1.51 (H) 06/08/2021 1104   CREATININE 1.40 (H) 09/01/2013 0410   CALCIUM 9.1 06/08/2021 1104   CALCIUM 8.0 (L) 09/01/2013 0410   GFRNONAA 46 (L) 06/05/2021 0307   GFRNONAA 49 (L) 09/01/2013 0410   GFRAA 57 (L) 10/04/2017 1306   GFRAA 57 (L) 09/01/2013 0410   GERD:   Reflux symptoms: no   Abd pain: no   Blood in stool: no  Dysphagia: no   EGD: 2008, esophagitis and stricture Medication: protonix  Lower extremity edema: This has been an ongoing issue since before he started radiation therapy for prostate cancer.  It does not resolve overnight.  He notes some minimal trouble breathing when he lays down though it seems to be more related to nasal congestion.  He notes no PND.  He notes blowing his nose does help with the trouble breathing when he lays down.  Abnormal taste: Patient notes his taste has not been right since he had COVID.  Food just does not taste good.  Ice cream still taste good.  Dysuria: This started with the radiation for his prostate cancer.  He has frequency and urgency.  No hematuria.  He notes his urine has not been checked.  Diarrhea: This seems to have  started with the radiation as well.  He was having lots of issues with constipation and was on MiraLAX 2-3 times a day as well as other stool softeners.  He has cut back to MiraLAX once daily.    Social History   Tobacco Use  Smoking Status Never  Smokeless Tobacco Never    Current Outpatient Medications on File Prior to Visit  Medication Sig Dispense Refill   amLODipine (NORVASC) 10 MG tablet TAKE 1 TABLET BY MOUTH EVERYDAY AT BEDTIME 90 tablet 1   aspirin 81 MG tablet Take 81 mg by mouth daily.     docusate sodium (COLACE) 100 MG capsule Take 100 mg by mouth 2 (two) times daily.     metoprolol succinate (TOPROL-XL) 50 MG 24 hr tablet TAKE 1 TABLET BY MOUTH EVERY DAY 90 tablet 1   Misc Natural Products (TART CHERRY ADVANCED PO) Take 425 mg by mouth daily.     MITIGARE 0.6 MG CAPS TAKE 0.6 MG BY MOUTH DAILY AS NEEDED (GOUT FLARE). 90 capsule 1   Multiple Vitamins-Minerals ([employer] SPECTRAVITE SENIOR) TABS Take 1 tablet by mouth daily.      pantoprazole (PROTONIX) 40 MG tablet TAKE 1 TABLET BY MOUTH TWICE A DAY 180 tablet 3   tamsulosin (FLOMAX) 0.4 MG CAPS capsule Take  by mouth.     traMADol (ULTRAM) 50 MG tablet      Wheat Dextrin (BENEFIBER DRINK MIX PO) Take by mouth.     Current Facility-Administered Medications on File Prior to Visit  Medication Dose Route Frequency Provider Last Rate Last Admin   0.9 %  sodium chloride infusion  500 mL Intravenous Once Armbruster, Carlota Raspberry, MD         ROS see history of present illness  Objective  Physical Exam Vitals:   08/15/21 0808  BP: 140/70  Pulse: 62  Temp: 98 F (36.7 C)  SpO2: 98%    BP Readings from Last 3 Encounters:  08/15/21 140/70  06/14/21 (!) 150/70  06/13/21 (!) 155/69   Wt Readings from Last 3 Encounters:  08/15/21 260 lb 0.3 oz (117.9 kg)  06/14/21 257 lb 6.4 oz (116.8 kg)  06/08/21 254 lb 6.4 oz (115.4 kg)    Physical Exam Constitutional:      General: He is not in acute distress.    Appearance: He is  not diaphoretic.  Cardiovascular:     Rate and Rhythm: Normal rate and regular rhythm.     Heart sounds: Normal heart sounds.  Pulmonary:     Effort: Pulmonary effort is normal.     Breath sounds: Normal breath sounds.  Musculoskeletal:     Comments: 2+ pitting edema bilateral lower extremities below the knee  Skin:    General: Skin is warm and dry.  Neurological:     Mental Status: He is alert.      Assessment/Plan: Please see individual problem list.  Problem List Items Addressed This Visit     Abnormal sense of taste (Chronic)    This is likely related to COVID-19 as it started when he had COVID.  I discussed that there is not any specific treatment for this and that we would just have to monitor for improvement.      Bilateral lower extremity edema (Chronic)    Likely a mix of venous insufficiency and related to his amlodipine.  We will obtain lab work and if negative for cause we will change his amlodipine to something different.      Relevant Orders   Comp Met (CMET)   CBC   TSH   B Nat Peptide   Essential hypertension - Primary (Chronic)    Adequately controlled.  I do not know how accurate the cancer center blood pressures are given that they do not recheck them.  He will continue amlodipine 10 mg once daily and metoprolol XL 50 mg daily.  We will consider switching the amlodipine to something different pending the work-up for his edema.      GERD (gastroesophageal reflux disease) (Chronic)    Well-controlled.  He will continue Protonix 40 mg twice daily.      Relevant Medications   docusate sodium (COLACE) 100 MG capsule   Diarrhea    This is likely related to his radiation therapy.  He will monitor and if it does not resolve after he completes his radiation he will let us know.      Frequent urination    Patient has frequent urination along with dysuria and urgency since starting radiation therapy for his prostate cancer.  I suspect this is related to his  radiation treatment though we will check a urinalysis.      Relevant Orders   POCT Urinalysis Dipstick    Return in about 6 months (around 02/14/2022) for Hypertension.   Randall Hiss  Caryl Bis, MD [place]

## 2021-08-15 NOTE — Assessment & Plan Note (Signed)
Well-controlled.  He will continue Protonix 40 mg twice daily.

## 2021-08-15 NOTE — Assessment & Plan Note (Signed)
Adequately controlled.  I do not know how accurate the cancer center blood pressures are given that they do not recheck them.  He will continue amlodipine 10 mg once daily and metoprolol XL 50 mg daily.  We will consider switching the amlodipine to something different pending the work-up for his edema.

## 2021-08-16 ENCOUNTER — Ambulatory Visit
Admission: RE | Admit: 2021-08-16 | Discharge: 2021-08-16 | Disposition: A | Discharge status: Home / Self Care | Payer: Medicare PPO | Source: Ambulatory Visit | Attending: Radiation Oncology | Admitting: Radiation Oncology

## 2021-08-16 ENCOUNTER — Other Ambulatory Visit: Payer: Self-pay

## 2021-08-16 DIAGNOSIS — Z510 Encounter for antineoplastic radiation therapy: Secondary | ICD-10-CM | POA: Diagnosis not present

## 2021-08-16 DIAGNOSIS — C61 Malignant neoplasm of prostate: Secondary | ICD-10-CM | POA: Diagnosis not present

## 2021-08-16 LAB — RAD ONC ARIA SESSION SUMMARY
Course Elapsed Days: 51
Plan Fractions Treated to Date: 37
Plan Prescribed Dose Per Fraction: 2 Gy
Plan Total Fractions Prescribed: 40
Plan Total Prescribed Dose: 80 Gy
Reference Point Dosage Given to Date: 74 Gy
Reference Point Session Dosage Given: 2 Gy
Session Number: 37

## 2021-08-17 ENCOUNTER — Other Ambulatory Visit: Payer: Self-pay

## 2021-08-17 ENCOUNTER — Ambulatory Visit: Payer: Medicare PPO

## 2021-08-17 ENCOUNTER — Ambulatory Visit
Admission: RE | Admit: 2021-08-17 | Discharge: 2021-08-17 | Disposition: A | Discharge status: Home / Self Care | Payer: Medicare PPO | Source: Ambulatory Visit | Attending: Radiation Oncology | Admitting: Radiation Oncology

## 2021-08-17 DIAGNOSIS — Z510 Encounter for antineoplastic radiation therapy: Secondary | ICD-10-CM | POA: Diagnosis not present

## 2021-08-17 DIAGNOSIS — I10 Essential (primary) hypertension: Secondary | ICD-10-CM

## 2021-08-17 DIAGNOSIS — C61 Malignant neoplasm of prostate: Secondary | ICD-10-CM | POA: Diagnosis not present

## 2021-08-17 LAB — RAD ONC ARIA SESSION SUMMARY
Course Elapsed Days: 52
Plan Fractions Treated to Date: 38
Plan Prescribed Dose Per Fraction: 2 Gy
Plan Total Fractions Prescribed: 40
Plan Total Prescribed Dose: 80 Gy
Reference Point Dosage Given to Date: 76 Gy
Reference Point Session Dosage Given: 2 Gy
Session Number: 38

## 2021-08-17 MED ORDER — VALSARTAN 80 MG PO TABS: 80.0000 mg | ORAL_TABLET | Freq: Every day | ORAL | 3 refills | Status: DC

## 2021-08-18 ENCOUNTER — Ambulatory Visit
Admission: RE | Admit: 2021-08-18 | Discharge: 2021-08-18 | Disposition: A | Discharge status: Home / Self Care | Payer: Medicare PPO | Source: Ambulatory Visit | Attending: Radiation Oncology | Admitting: Radiation Oncology

## 2021-08-18 ENCOUNTER — Other Ambulatory Visit: Payer: Self-pay

## 2021-08-18 ENCOUNTER — Ambulatory Visit: Payer: Medicare PPO | Admitting: Family Medicine

## 2021-08-18 DIAGNOSIS — C61 Malignant neoplasm of prostate: Secondary | ICD-10-CM | POA: Diagnosis not present

## 2021-08-18 DIAGNOSIS — Z510 Encounter for antineoplastic radiation therapy: Secondary | ICD-10-CM | POA: Diagnosis not present

## 2021-08-18 LAB — RAD ONC ARIA SESSION SUMMARY
Course Elapsed Days: 53
Plan Fractions Treated to Date: 39
Plan Prescribed Dose Per Fraction: 2 Gy
Plan Total Fractions Prescribed: 40
Plan Total Prescribed Dose: 80 Gy
Reference Point Dosage Given to Date: 78 Gy
Reference Point Session Dosage Given: 2 Gy
Session Number: 39

## 2021-08-21 ENCOUNTER — Other Ambulatory Visit: Payer: Self-pay

## 2021-08-21 ENCOUNTER — Ambulatory Visit
Admission: RE | Admit: 2021-08-21 | Discharge: 2021-08-21 | Disposition: A | Discharge status: Home / Self Care | Payer: Medicare PPO | Source: Ambulatory Visit | Attending: Radiation Oncology | Admitting: Radiation Oncology

## 2021-08-21 DIAGNOSIS — Z510 Encounter for antineoplastic radiation therapy: Secondary | ICD-10-CM | POA: Diagnosis not present

## 2021-08-21 DIAGNOSIS — C61 Malignant neoplasm of prostate: Secondary | ICD-10-CM | POA: Diagnosis not present

## 2021-08-21 LAB — RAD ONC ARIA SESSION SUMMARY
Course Elapsed Days: 56
Plan Fractions Treated to Date: 40
Plan Prescribed Dose Per Fraction: 2 Gy
Plan Total Fractions Prescribed: 40
Plan Total Prescribed Dose: 80 Gy
Reference Point Dosage Given to Date: 80 Gy
Reference Point Session Dosage Given: 2 Gy
Session Number: 40

## 2021-08-23 ENCOUNTER — Other Ambulatory Visit: Payer: Medicare PPO

## 2021-08-23 ENCOUNTER — Ambulatory Visit: Payer: Medicare PPO | Admitting: Urology

## 2021-08-23 ENCOUNTER — Ambulatory Visit (INDEPENDENT_AMBULATORY_CARE_PROVIDER_SITE_OTHER): Payer: Medicare PPO

## 2021-08-23 DIAGNOSIS — I10 Essential (primary) hypertension: Secondary | ICD-10-CM

## 2021-08-23 LAB — BASIC METABOLIC PANEL
BUN: 14 mg/dL (ref 6–23)
CO2: 27 mEq/L (ref 19–32)
Calcium: 9.4 mg/dL (ref 8.4–10.5)
Chloride: 102 mEq/L (ref 96–112)
Creatinine, Ser: 1.32 mg/dL (ref 0.40–1.50)
GFR: 50.45 mL/min — ABNORMAL LOW (ref >60.00)
Glucose, Bld: 111 mg/dL — ABNORMAL HIGH (ref 70–99)
Potassium: 4 mEq/L (ref 3.5–5.1)
Sodium: 137 mEq/L (ref 135–145)

## 2021-08-23 NOTE — Progress Notes (Signed)
FYI I called patient & he stated that he will spot check BP at home. If any lightheadedness or diastolic readings staying under 55 he would like up know.

## 2021-08-23 NOTE — Progress Notes (Signed)
Patient came in today for BP check taken in left arm with large cuff. BP reading was 114/52 P 66. Pt stated that he had been taking the Valsartan 80 mg & felt okay. Stated that he was just weak because he finished his last round of radiation. Pt was taken to lab for BMP.

## 2021-09-01 ENCOUNTER — Ambulatory Visit
Admission: EM | Admit: 2021-09-01 | Discharge: 2021-09-01 | Disposition: A | Discharge status: Home / Self Care | Payer: Medicare PPO | Attending: Emergency Medicine | Admitting: Emergency Medicine

## 2021-09-01 ENCOUNTER — Encounter: Payer: Self-pay | Admitting: Emergency Medicine

## 2021-09-01 ENCOUNTER — Encounter: Payer: Self-pay | Admitting: Family Medicine

## 2021-09-01 DIAGNOSIS — E860 Dehydration: Secondary | ICD-10-CM | POA: Insufficient documentation

## 2021-09-01 DIAGNOSIS — R197 Diarrhea, unspecified: Secondary | ICD-10-CM | POA: Diagnosis not present

## 2021-09-01 DIAGNOSIS — R112 Nausea with vomiting, unspecified: Secondary | ICD-10-CM | POA: Diagnosis not present

## 2021-09-01 LAB — URINALYSIS, ROUTINE W REFLEX MICROSCOPIC
Bilirubin Urine: NEGATIVE
Glucose, UA: NEGATIVE mg/dL
Ketones, ur: NEGATIVE mg/dL
Leukocytes,Ua: NEGATIVE
Nitrite: NEGATIVE
Protein, ur: >300 mg/dL — AB
Specific Gravity, Urine: >1.03 — ABNORMAL HIGH (ref 1.005–1.030)
pH: 5.5 (ref 5.0–8.0)

## 2021-09-01 LAB — CBC WITH DIFFERENTIAL/PLATELET
Abs Immature Granulocytes: 0.03 10*3/uL (ref 0.00–0.07)
Basophils Absolute: 0 10*3/uL (ref 0.0–0.1)
Basophils Relative: 0 %
Eosinophils Absolute: 0 10*3/uL (ref 0.0–0.5)
Eosinophils Relative: 0 %
HCT: 39.8 % (ref 39.0–52.0)
Hemoglobin: 13.7 g/dL (ref 13.0–17.0)
Immature Granulocytes: 0 %
Lymphocytes Relative: 7 %
Lymphs Abs: 0.6 10*3/uL — ABNORMAL LOW (ref 0.7–4.0)
MCH: 31.4 pg (ref 26.0–34.0)
MCHC: 34.4 g/dL (ref 30.0–36.0)
MCV: 91.1 fL (ref 80.0–100.0)
Monocytes Absolute: 0.5 10*3/uL (ref 0.1–1.0)
Monocytes Relative: 6 %
Neutro Abs: 7.5 10*3/uL (ref 1.7–7.7)
Neutrophils Relative %: 87 %
Platelets: 189 10*3/uL (ref 150–400)
RBC: 4.37 MIL/uL (ref 4.22–5.81)
RDW: 16.7 % — ABNORMAL HIGH (ref 11.5–15.5)
WBC: 8.7 10*3/uL (ref 4.0–10.5)
nRBC: 0 % (ref 0.0–0.2)

## 2021-09-01 LAB — COMPREHENSIVE METABOLIC PANEL
ALT: 42 U/L (ref 0–44)
AST: 44 U/L — ABNORMAL HIGH (ref 15–41)
Albumin: 4.1 g/dL (ref 3.5–5.0)
Alkaline Phosphatase: 61 U/L (ref 38–126)
Anion gap: 10 (ref 5–15)
BUN: 23 mg/dL (ref 8–23)
CO2: 25 mmol/L (ref 22–32)
Calcium: 9.1 mg/dL (ref 8.9–10.3)
Chloride: 102 mmol/L (ref 98–111)
Creatinine, Ser: 1.5 mg/dL — ABNORMAL HIGH (ref 0.61–1.24)
GFR, Estimated: 46 mL/min — ABNORMAL LOW (ref >60)
Glucose, Bld: 118 mg/dL — ABNORMAL HIGH (ref 70–99)
Potassium: 3.9 mmol/L (ref 3.5–5.1)
Sodium: 137 mmol/L (ref 135–145)
Total Bilirubin: 0.8 mg/dL (ref 0.3–1.2)
Total Protein: 8.3 g/dL — ABNORMAL HIGH (ref 6.5–8.1)

## 2021-09-01 LAB — URINALYSIS, MICROSCOPIC (REFLEX)

## 2021-09-01 MED ORDER — ONDANSETRON 8 MG PO TBDP
8.0000 mg | ORAL_TABLET | Freq: Once | ORAL | Status: AC
  Administered 2021-09-01: 8 mg via ORAL

## 2021-09-01 MED ORDER — ONDANSETRON 8 MG PO TBDP: 8.0000 mg | ORAL_TABLET | Freq: Three times a day (TID) | ORAL | 0 refills | Status: DC | PRN

## 2021-09-01 NOTE — Telephone Encounter (Signed)
Spoke with pt and he stated that he is "not doing good". Pt was advised that he needs to go to the ED or to the [place] [facility] for possible fluids for dehydration. Pt gave a verbal understanding and stated that he will go.

## 2021-09-01 NOTE — Telephone Encounter (Signed)
Spoke with pt and informed him of Padonda's message below.

## 2021-09-01 NOTE — ED Provider Notes (Signed)
[facility]    CSN: [id number] Arrival date & time: 09/01/21  1141      History   Chief Complaint Chief Complaint  Patient presents with   Diarrhea    HPI [name] is a 81 y.o. male.   HPI  81 year old male here for evaluation of GI and GU complaints.  Patient is currently under treatment for prostate cancer and he recently finished a course of 40 radiation treatments on June 19.  Since that time he has had nausea, vomiting, and diarrhea daily.  The symptoms worsened 24 hours ago when he started to have diarrhea stools every hour that are mixture of large and small volumes.  No blood per patient's report.  He is also had pain with urination and yesterday he noticed a blood clot in his urine.  He also has urinary urgency and frequency.  He denies any pain in his abdomen.  He states that yesterday he was only able to drink a small sip of water late in the evening but this morning he has had 2 regular Cokes and has not had any emesis.  Patient contacted his PCP at [facility] primary care Paul advised him to come seek care in the ER or urgent care.  Past Medical History:  Diagnosis Date   Allergy    Anxiety    Arthritis    Cataract    beginning stage bilateral   Diverticulosis    Elevated PSA    Esophageal stricture    Esophagitis    GERD (gastroesophageal reflux disease)    Gout    History of kidney stones    Hyperlipidemia    Hypertension    Inguinal hernia    Internal hemorrhoids    Pancreatitis    Prostate cancer ([place])    Tubular adenoma of colon     Patient Active Problem List   Diagnosis Date Noted   Diarrhea 08/15/2021   Abnormal sense of taste 08/15/2021   Personal history of prostate cancer 06/15/2021   Recurrent microscopic hematuria 06/15/2021   Simple renal cyst 06/15/2021   Stage 3b chronic kidney disease ([place]) 06/15/2021   Unspecified urethral stricture, male, unspecified site 06/15/2021   Bilateral hip pain 06/14/2021    Constipation 02/17/2021   History of COVID-19 02/17/2021   Basal cell carcinoma 02/17/2021   Bilateral lower extremity edema 02/17/2021   Benign prostatic hyperplasia 09/15/2020   Proteinuria, unspecified 09/15/2020   Decreased energy 08/16/2020   Rectal bleeding 02/04/2020   Allergic rhinitis 02/11/2019   Gross hematuria 08/06/2017   Bladder calculi 08/06/2017   Rotator cuff impingement syndrome, left 07/18/2016   Carpal tunnel syndrome 04/26/2016   Frequent urination 04/26/2016   GERD (gastroesophageal reflux disease) 01/09/2016   Obesity 01/09/2016   Renal stone 07/16/2014   Elevated prostate specific antigen (PSA) 07/20/2013   Gout 06/23/2012   Prostate cancer ([place]) 07/06/2010   Hyperlipidemia 08/19/2006   Essential hypertension 08/19/2006    Past Surgical History:  Procedure Laterality Date   BACK SURGERY  1980's   L4/5   BALLOON DILATION N/A 11/17/2018   Procedure: URETHRAL BALLOON DILATION;  Surgeon: Hollice Espy, MD;  Location: [facility];  Service: Urology;  Laterality: N/A;   CARPAL TUNNEL RELEASE Right 08/15/2016   Procedure: CARPAL TUNNEL RELEASE ENDOSCOPIC;  Surgeon: Corky Mull, MD;  Location: [place];  Service: Orthopedics;  Laterality: Right;   CATARACT EXTRACTION W/PHACO Right 03/14/2021   Procedure: CATARACT EXTRACTION PHACO AND INTRAOCULAR LENS PLACEMENT (IOC) RIGHT 6.84  00:52.3;  Surgeon: Birder Robson, MD;  Location: [place];  Service: Ophthalmology;  Laterality: Right;   CATARACT EXTRACTION W/PHACO Left 03/28/2021   Procedure: CATARACT EXTRACTION PHACO AND INTRAOCULAR LENS PLACEMENT ([place]) LEFT;  Surgeon: Birder Robson, MD;  Location: [place];  Service: Ophthalmology;  Laterality: Left;   COLONOSCOPY     CYSTOSCOPY N/A 11/17/2018   Procedure: CYSTOSCOPY;  Surgeon: Hollice Espy, MD;  Location: [facility];  Service: Urology;  Laterality: N/A;   CYSTOSCOPY WITH LITHOLAPAXY N/A 10/09/2017   Procedure: CYSTOSCOPY WITH  LITHOLAPAXY;  Surgeon: Hollice Espy, MD;  Location: [facility];  Service: Urology;  Laterality: N/A;   ESOPHAGOGASTRODUODENOSCOPY  11-21-06   Esophagitis, Stricture/dilated    HOLEP-LASER ENUCLEATION OF THE PROSTATE WITH MORCELLATION N/A 10/09/2017   Procedure: HOLEP-LASER ENUCLEATION OF THE PROSTATE WITH MORCELLATION;  Surgeon: Hollice Espy, MD;  Location: [facility];  Service: Urology;  Laterality: N/A;   LYMPH NODE BIOPSY  1957   benign  lymph node removed   Right arm fracture     TONSILLECTOMY     TOTAL KNEE ARTHROPLASTY  09-02-02   Bilateral   UPPER GASTROINTESTINAL ENDOSCOPY         Home Medications    Prior to Admission medications   Medication Sig Start Date End Date Taking? Authorizing Provider  amLODipine (NORVASC) 10 MG tablet Take 10 mg by mouth daily. 08/19/21  Yes [provider]  docusate sodium (COLACE) 100 MG capsule Take 100 mg by mouth 2 (two) times daily. 07/03/21  Yes [provider]  Multiple Vitamins-Minerals (CVS SPECTRAVITE SENIOR) TABS Take 1 tablet by mouth daily.    Yes [provider]  ondansetron (ZOFRAN-ODT) 8 MG disintegrating tablet Take 1 tablet (8 mg total) by mouth every 8 (eight) hours as needed for nausea or vomiting. 09/01/21  Yes Margarette Canada, NP  pantoprazole (PROTONIX) 40 MG tablet TAKE 1 TABLET BY MOUTH TWICE A DAY 08/03/21  Yes Leone Haven, MD  tamsulosin (FLOMAX) 0.4 MG CAPS capsule Take by mouth. 04/11/21  Yes [provider]  valsartan (DIOVAN) 80 MG tablet Take 1 tablet (80 mg total) by mouth daily. 08/17/21  Yes Leone Haven, MD  aspirin 81 MG tablet Take 81 mg by mouth daily.    [provider]  metoprolol succinate (TOPROL-XL) 50 MG 24 hr tablet TAKE 1 TABLET BY MOUTH EVERY DAY 03/01/21   Leone Haven, MD  Misc Natural Products (TART CHERRY ADVANCED PO) Take 425 mg by mouth daily.    [provider]  MITIGARE 0.6 MG CAPS TAKE 0.6 MG BY MOUTH DAILY AS NEEDED (GOUT FLARE).  04/12/20   Leone Haven, MD  traMADol Veatrice Bourbon) 50 MG tablet  06/14/21   [provider]  Wheat Dextrin (BENEFIBER DRINK MIX PO) Take by mouth.    [provider]    Family History Family History  Problem Relation Age of Onset   Ulcers Father    Diverticulitis Father    Arthritis Father    Hypertension Brother    Obesity Brother    Cancer Brother        throat   Arthritis Mother    Depression Mother        Anxiety   Colon cancer Neg Hx    Colon polyps Neg Hx    Esophageal cancer Neg Hx    Rectal cancer Neg Hx    Stomach cancer Neg Hx    Kidney disease Neg Hx    Kidney cancer  Neg Hx    Prostate cancer Neg Hx     Social History Social History   Tobacco Use   Smoking status: Never   Smokeless tobacco: Never  Vaping Use   Vaping Use: Never used  Substance Use Topics   Alcohol use: No   Drug use: No     Allergies   Penicillins, Acetaminophen, and Aspirin   Review of Systems Review of Systems  Constitutional:  Negative for fever.  Gastrointestinal:  Positive for diarrhea, nausea and vomiting. Negative for abdominal pain and blood in stool.  Genitourinary:  Positive for frequency, hematuria and urgency.  Skin:  Negative for rash.  Hematological: Negative.   Psychiatric/Behavioral: Negative.       Physical Exam Triage Vital Signs ED Triage Vitals  Enc Vitals Group     BP      Pulse      Resp      Temp      Temp src      SpO2      Weight      Height      Head Circumference      Peak Flow      Pain Score      Pain Loc      Pain Edu?      Excl. in [place]?    No data found.  Updated Vital Signs BP (!) 155/80 (BP Location: Left Arm)   Pulse 92   Temp 98.7 F (37.1 C) (Oral)   Resp 15   Ht '5\' 8"'$  (1.727 m)   Wt 260 lb (117.9 kg)   SpO2 97%   BMI 39.53 kg/m   Visual Acuity Right Eye Distance:   Left Eye Distance:   Bilateral Distance:    Right Eye Near:   Left Eye Near:    Bilateral Near:     Physical Exam Vitals and  nursing note reviewed.  Constitutional:      Appearance: Normal appearance. He is not ill-appearing.  HENT:     Head: Normocephalic and atraumatic.     Mouth/Throat:     Mouth: Mucous membranes are moist.     Pharynx: Oropharynx is clear. No oropharyngeal exudate or posterior oropharyngeal erythema.  Eyes:     General: No scleral icterus.    Extraocular Movements: Extraocular movements intact.     Conjunctiva/sclera: Conjunctivae normal.     Pupils: Pupils are equal, round, and reactive to light.  Cardiovascular:     Rate and Rhythm: Normal rate and regular rhythm.     Pulses: Normal pulses.     Heart sounds: Normal heart sounds.  Pulmonary:     Effort: Pulmonary effort is normal.     Breath sounds: Normal breath sounds. No wheezing, rhonchi or rales.  Abdominal:     General: Abdomen is flat. Bowel sounds are normal.     Palpations: Abdomen is soft.     Tenderness: There is no abdominal tenderness. There is no right CVA tenderness, left CVA tenderness, guarding or rebound.  Skin:    General: Skin is warm and dry.     Capillary Refill: Capillary refill takes less than 2 seconds.     Findings: No erythema or rash.  Neurological:     General: No focal deficit present.     Mental Status: He is alert and oriented to person, place, and time.  Psychiatric:        Mood and Affect: Mood normal.        Behavior: Behavior  normal.        Thought Content: Thought content normal.        Judgment: Judgment normal.      UC Treatments / Results  Labs (all labs ordered are listed, but only abnormal results are displayed) Labs Reviewed  URINALYSIS, ROUTINE W REFLEX MICROSCOPIC - Abnormal; Notable for the following components:      Result Value   Specific Gravity, Urine >1.030 (*)    Hgb urine dipstick LARGE (*)    Protein, ur >300 (*)    All other components within normal limits  COMPREHENSIVE METABOLIC PANEL - Abnormal; Notable for the following components:   Glucose, Bld 118 (*)     Creatinine, Ser 1.50 (*)    Total Protein 8.3 (*)    AST 44 (*)    GFR, Estimated 46 (*)    All other components within normal limits  CBC WITH DIFFERENTIAL/PLATELET - Abnormal; Notable for the following components:   RDW 16.7 (*)    Lymphs Abs 0.6 (*)    All other components within normal limits  URINALYSIS, MICROSCOPIC (REFLEX) - Abnormal; Notable for the following components:   Bacteria, UA RARE (*)    All other components within normal limits    EKG   Radiology No results found.  Procedures Procedures (including critical care time)  Medications Ordered in UC Medications  ondansetron (ZOFRAN-ODT) disintegrating tablet 8 mg (8 mg Oral Given 09/01/21 1206)    Initial Impression / Assessment and Plan / UC Course  I have reviewed the triage vital signs and the nursing notes.  Pertinent labs & imaging results that were available during my care of the patient were reviewed by me and considered in my medical decision making (see chart for details).  Patient is a pleasant, nontoxic-appearing 81 year old male here for evaluation of GI and GU symptoms as outlined in HPI above.  Patient is in good spirits and does not demonstrate any evidence of dehydration.  His skin turgor is normal and his sclera are bright and shiny.  Or mucous membranes are also moist and pink.  Cardiopulmonary exam reveals S1-S2 heart sounds with regular rate and rhythm and lung sounds that are clear to auscultation in all fields.  No CVA tenderness on exam.  Abdomen is soft, flat, nontender to palpation, with positive bowel sounds in all 4 quadrants.  Given patient's protracted duration of diarrhea and vomiting symptoms I will check CBC and CMP to look for presence of infection or electrolyte abnormality.  I will also check urinalysis to rule out the presence of urinary tract infection.  Urinalysis shows a high specific gravity of >1.030, large hemoglobin, and greater than 300 protein.  It is negative for nitrites or  leukocyte esterase.  The reflex microscopy shows 6-10 RBCs, 0-5 WBCs, rare bacteria, and mucus present. Urinalysis results are similar to those seen at [facility] on [facility]/15/2021.  At that time patient did not have any hemoglobin on his urine dip but he was positive for protein and had a high specific gravity.  CBC is normal white count of 8.7.  H&H is 13.7 and 39.8.  Platelet count is 189.  CMP shows an elevated creatinine of 1.50 and a mildly elevated AST of 44.  Total protein is mildly elevated as well at 8.3.  Sodium is 137, potassium 3.9, BUN is 23. Revisiting past lab samples shows that patient also had an elevated creatinine of 1.51 in April.  Patient's blood work reflects mild dehydration secondary to his vomiting and  diarrhea.  I will discharge him home with Zofran that he can use for control of the nausea and vomiting.  I do not want him to use Lomotil for the diarrhea as this is resulted in a bowel obstruction twice in the past for him given that he has a propensity towards constipation.  If he has return of nausea and vomiting that the Zofran will not treat I will recommend that he follow-up in the emergency department for IV fluid administration.   Final Clinical Impressions(s) / UC Diagnoses   Final diagnoses:  Nausea vomiting and diarrhea  Dehydration     Discharge Instructions      Your blood work did not show any signs of infection and neither did your urinalysis.  Both indicate that you have mild dehydration secondary to the fluid loss through vomiting and diarrhea.  Use the Zofran every 8 hours to help with nausea and vomiting.  Follow the rehydration guide given in your discharge instructions.  You can use water, Pedialyte, or broth to help rehydrate yourself.  If you have a return of nausea and vomiting and you are unable to take in fluids by mouth I recommend you go to the emergency department for IV fluid ministration.     ED Prescriptions     Medication Sig  Dispense Auth. Provider   ondansetron (ZOFRAN-ODT) 8 MG disintegrating tablet Take 1 tablet (8 mg total) by mouth every 8 (eight) hours as needed for nausea or vomiting. 20 tablet Margarette Canada, NP      PDMP not reviewed this encounter.   Margarette Canada, NP 09/01/21 [id number]

## 2021-09-01 NOTE — ED Triage Notes (Addendum)
Patient c/o diarrhea and vomiting that started yesterday.  Patient has prostrate cancer and had his last radiation on June 19.  Patient reports hot flashes.  Patient denies fevers.  Patient reports urinary frequency and dysuria for a week.  Patient states that he has been taking AZO and drinking cranberry juice.

## 2021-09-01 NOTE — ED Notes (Signed)
Patient has tolerated drinking water well.

## 2021-09-01 NOTE — Discharge Instructions (Addendum)
Your blood work did not show any signs of infection and neither did your urinalysis.  Both indicate that you have mild dehydration secondary to the fluid loss through vomiting and diarrhea.  Use the Zofran every 8 hours to help with nausea and vomiting.  Follow the rehydration guide given in your discharge instructions.  You can use water, Pedialyte, or broth to help rehydrate yourself.  If you have a return of nausea and vomiting and you are unable to take in fluids by mouth I recommend you go to the emergency department for IV fluid ministration.

## 2021-09-04 DIAGNOSIS — I10 Essential (primary) hypertension: Secondary | ICD-10-CM | POA: Diagnosis not present

## 2021-09-04 DIAGNOSIS — M1000 Idiopathic gout, unspecified site: Secondary | ICD-10-CM | POA: Diagnosis not present

## 2021-09-04 DIAGNOSIS — R809 Proteinuria, unspecified: Secondary | ICD-10-CM | POA: Diagnosis not present

## 2021-09-04 DIAGNOSIS — N200 Calculus of kidney: Secondary | ICD-10-CM | POA: Diagnosis not present

## 2021-09-04 DIAGNOSIS — N1832 Chronic kidney disease, stage 3b: Secondary | ICD-10-CM | POA: Diagnosis not present

## 2021-09-04 DIAGNOSIS — N400 Enlarged prostate without lower urinary tract symptoms: Secondary | ICD-10-CM | POA: Diagnosis not present

## 2021-09-04 DIAGNOSIS — R3129 Other microscopic hematuria: Secondary | ICD-10-CM | POA: Diagnosis not present

## 2021-09-07 DIAGNOSIS — D2261 Melanocytic nevi of right upper limb, including shoulder: Secondary | ICD-10-CM | POA: Diagnosis not present

## 2021-09-07 DIAGNOSIS — L821 Other seborrheic keratosis: Secondary | ICD-10-CM | POA: Diagnosis not present

## 2021-09-07 DIAGNOSIS — D2272 Melanocytic nevi of left lower limb, including hip: Secondary | ICD-10-CM | POA: Diagnosis not present

## 2021-09-07 DIAGNOSIS — D2262 Melanocytic nevi of left upper limb, including shoulder: Secondary | ICD-10-CM | POA: Diagnosis not present

## 2021-09-07 DIAGNOSIS — D225 Melanocytic nevi of trunk: Secondary | ICD-10-CM | POA: Diagnosis not present

## 2021-09-07 DIAGNOSIS — D2271 Melanocytic nevi of right lower limb, including hip: Secondary | ICD-10-CM | POA: Diagnosis not present

## 2021-09-07 DIAGNOSIS — Z85828 Personal history of other malignant neoplasm of skin: Secondary | ICD-10-CM | POA: Diagnosis not present

## 2021-09-07 DIAGNOSIS — L720 Epidermal cyst: Secondary | ICD-10-CM | POA: Diagnosis not present

## 2021-09-07 DIAGNOSIS — Z08 Encounter for follow-up examination after completed treatment for malignant neoplasm: Secondary | ICD-10-CM | POA: Diagnosis not present

## 2021-09-07 DIAGNOSIS — D485 Neoplasm of uncertain behavior of skin: Secondary | ICD-10-CM | POA: Diagnosis not present

## 2021-09-12 DIAGNOSIS — M5033 Other cervical disc degeneration, cervicothoracic region: Secondary | ICD-10-CM | POA: Diagnosis not present

## 2021-09-12 DIAGNOSIS — M9903 Segmental and somatic dysfunction of lumbar region: Secondary | ICD-10-CM | POA: Diagnosis not present

## 2021-09-12 DIAGNOSIS — M9901 Segmental and somatic dysfunction of cervical region: Secondary | ICD-10-CM | POA: Diagnosis not present

## 2021-09-12 DIAGNOSIS — M5136 Other intervertebral disc degeneration, lumbar region: Secondary | ICD-10-CM | POA: Diagnosis not present

## 2021-09-13 ENCOUNTER — Encounter: Payer: Self-pay | Admitting: Urology

## 2021-09-14 DIAGNOSIS — N200 Calculus of kidney: Secondary | ICD-10-CM | POA: Diagnosis not present

## 2021-09-14 DIAGNOSIS — I10 Essential (primary) hypertension: Secondary | ICD-10-CM | POA: Diagnosis not present

## 2021-09-14 DIAGNOSIS — N281 Cyst of kidney, acquired: Secondary | ICD-10-CM | POA: Diagnosis not present

## 2021-09-14 DIAGNOSIS — R3129 Other microscopic hematuria: Secondary | ICD-10-CM | POA: Diagnosis not present

## 2021-09-14 DIAGNOSIS — N35919 Unspecified urethral stricture, male, unspecified site: Secondary | ICD-10-CM | POA: Diagnosis not present

## 2021-09-14 DIAGNOSIS — N1832 Chronic kidney disease, stage 3b: Secondary | ICD-10-CM | POA: Diagnosis not present

## 2021-09-14 DIAGNOSIS — Z8546 Personal history of malignant neoplasm of prostate: Secondary | ICD-10-CM | POA: Diagnosis not present

## 2021-09-14 DIAGNOSIS — M1000 Idiopathic gout, unspecified site: Secondary | ICD-10-CM | POA: Diagnosis not present

## 2021-09-14 DIAGNOSIS — R809 Proteinuria, unspecified: Secondary | ICD-10-CM | POA: Diagnosis not present

## 2021-09-16 ENCOUNTER — Encounter: Payer: Self-pay | Admitting: Family Medicine

## 2021-09-18 ENCOUNTER — Ambulatory Visit
Admission: RE | Admit: 2021-09-18 | Discharge: 2021-09-18 | Disposition: A | Discharge status: Home / Self Care | Payer: Medicare PPO | Source: Ambulatory Visit | Attending: Radiation Oncology | Admitting: Radiation Oncology

## 2021-09-18 ENCOUNTER — Other Ambulatory Visit: Payer: Self-pay

## 2021-09-18 ENCOUNTER — Other Ambulatory Visit: Payer: Self-pay | Admitting: *Deleted

## 2021-09-18 VITALS — BP 169/89 | HR 61 | Temp 96.8°F | Resp 18 | Ht 68.0 in | Wt 255.4 lb

## 2021-09-18 DIAGNOSIS — C61 Malignant neoplasm of prostate: Secondary | ICD-10-CM | POA: Diagnosis not present

## 2021-09-18 DIAGNOSIS — Z923 Personal history of irradiation: Secondary | ICD-10-CM | POA: Insufficient documentation

## 2021-09-18 DIAGNOSIS — R197 Diarrhea, unspecified: Secondary | ICD-10-CM | POA: Insufficient documentation

## 2021-09-18 NOTE — Progress Notes (Signed)
Radiation Oncology Follow up Note  Name: [name]   Date:   09/18/2021 MRN:  [id number] DOB: [birth year]    This 81 y.o. male presents to the clinic today for 1 month follow-up status post IMRT radiation therapy to his prostate and pelvic nodes for a Gleason 8 (4+4) adenocarcinoma the prostate presenting with a PSA of 13.1.  REFERRING PROVIDER: Leone Haven, MD  HPI: Patient is an 81 year old male now out 1 month having completed IMRT radiation therapy to his prostate and pelvic nodes for Gleason 8 adenocarcinoma.  Seen today in routine follow-up he is doing fairly well.  Continues to have some moderate intermittent diarrhea does not take Imodium based on his GI doctors recommendations.  He also continues to have some nocturia frequency and urgency of urination although this predated his radiation treatments.  He is using cranberry juice patient is also quite fatigued may be related to his ADT therapy..  COMPLICATIONS OF TREATMENT: none  FOLLOW UP COMPLIANCE: keeps appointments   PHYSICAL EXAM:  BP (!) 169/89   Pulse 61   Temp (!) 96.8 F (36 C)   Resp 18   Ht '5\' 8"'$  (1.727 m)   Wt 255 lb 6.4 oz (115.8 kg)   BMI 38.83 kg/m  Well-developed well-nourished patient in NAD. HEENT reveals PERLA, EOMI, discs not visualized.  Oral cavity is clear. No oral mucosal lesions are identified. Neck is clear without evidence of cervical or supraclavicular adenopathy. Lungs are clear to A&P. Cardiac examination is essentially unremarkable with regular rate and rhythm without murmur rub or thrill. Abdomen is benign with no organomegaly or masses noted. Motor sensory and DTR levels are equal and symmetric in the upper and lower extremities. Cranial nerves II through XII are grossly intact. Proprioception is intact. No peripheral adenopathy or edema is identified. No motor or sensory levels are noted. Crude visual fields are within normal range.  RADIOLOGY RESULTS: No current films for  review  PLAN: Present time patient is doing fairly well does have some residual lower urinary tract symptoms although these predated his radiation treatments.  I am otherwise pleased with his overall progress.  Of asked to see him back in 3 months with a follow-up PSA at that time.  Patient knows to call with any concerns.  I would like to take this opportunity to thank you for allowing me to participate in the care of your patient.Noreene Filbert, MD

## 2021-09-25 ENCOUNTER — Other Ambulatory Visit: Payer: Self-pay | Admitting: Family Medicine

## 2021-09-29 ENCOUNTER — Encounter: Payer: Self-pay | Admitting: Internal Medicine

## 2021-10-10 DIAGNOSIS — M5033 Other cervical disc degeneration, cervicothoracic region: Secondary | ICD-10-CM | POA: Diagnosis not present

## 2021-10-10 DIAGNOSIS — M9901 Segmental and somatic dysfunction of cervical region: Secondary | ICD-10-CM | POA: Diagnosis not present

## 2021-10-10 DIAGNOSIS — M5136 Other intervertebral disc degeneration, lumbar region: Secondary | ICD-10-CM | POA: Diagnosis not present

## 2021-10-10 DIAGNOSIS — M9903 Segmental and somatic dysfunction of lumbar region: Secondary | ICD-10-CM | POA: Diagnosis not present

## 2021-10-16 ENCOUNTER — Telehealth: Payer: Self-pay | Admitting: Family Medicine

## 2021-10-16 DIAGNOSIS — H3589 Other specified retinal disorders: Secondary | ICD-10-CM | POA: Diagnosis not present

## 2021-10-16 NOTE — Telephone Encounter (Signed)
Copied from [place] [id number]. Topic: Medicare AWV >> Oct 16, 2021  9:50 AM Devoria Glassing wrote: Reason for CRM: Left message for patient to schedule Annual Wellness Visit.  Please schedule with Nurse Health Advisor Denisa O'Brien-Blaney, LPN at [facility]. This appt can be telephone or office visit.  Please call [phone] ask for [facility]

## 2021-10-23 ENCOUNTER — Encounter: Payer: Self-pay | Admitting: Family Medicine

## 2021-10-24 ENCOUNTER — Ambulatory Visit (INDEPENDENT_AMBULATORY_CARE_PROVIDER_SITE_OTHER): Payer: Medicare PPO | Admitting: Primary Care

## 2021-10-24 ENCOUNTER — Encounter: Payer: Self-pay | Admitting: Primary Care

## 2021-10-24 ENCOUNTER — Ambulatory Visit (INDEPENDENT_AMBULATORY_CARE_PROVIDER_SITE_OTHER): Payer: Medicare PPO

## 2021-10-24 VITALS — Ht 68.0 in | Wt 257.0 lb

## 2021-10-24 VITALS — BP 136/78 | HR 76 | Temp 98.6°F | Ht 68.0 in | Wt 257.0 lb

## 2021-10-24 DIAGNOSIS — Z0000 Encounter for general adult medical examination without abnormal findings: Secondary | ICD-10-CM

## 2021-10-24 DIAGNOSIS — R051 Acute cough: Secondary | ICD-10-CM | POA: Diagnosis not present

## 2021-10-24 MED ORDER — AZITHROMYCIN 250 MG PO TABS: ORAL_TABLET | ORAL | 0 refills | Status: DC

## 2021-10-24 MED ORDER — BENZONATATE 200 MG PO CAPS: 200.0000 mg | ORAL_CAPSULE | Freq: Three times a day (TID) | ORAL | 0 refills | Status: DC | PRN

## 2021-10-24 NOTE — Assessment & Plan Note (Signed)
Worsening. Appears acutely fatigued. Exam without obvious signs of pneumonia.  Given duration of symptoms, coupled with presentation and worsening of sypmtoms, will treat for presumed bacterial involvement.   PCN allergy. Start azithromycin course x 5 days. Instructions provided. Rx for [employer] provided.   Return precautions with PCP provided.  He appears stable for outpatient treatment.

## 2021-10-24 NOTE — Progress Notes (Signed)
Subjective:    Patient ID: [name], male    DOB: 1940-07-19, 81 y.o.   MRN: [id number]  Cough Associated symptoms include headaches and postnasal drip. Pertinent negatives include no chest pain, fever, sore throat or shortness of breath.  Headache  Associated symptoms include coughing and sinus pressure. Pertinent negatives include no fever or sore throat.    [name] is a very pleasant 81 y.o. male patient of Dr. Caryl Bis with a history of hypertension, allergic rhinitis, GERD, prostate cancer, who presents today to discuss cough.   Symptom onset one week ago with scratchy throat, cough. Since then he's developed a productive cough, nasal congestion pulling out copious amounts of green mucous, fatigue, eye drainage throughout the day with crusting in the morning when waking. Yesterday he developed a bilateral frontal lobe headache which isn't typical for him.   He tested negative for Covid-19 two days ago. His wife endorses that he's felt hot a few times. They've not checked his temperature. Chronic hot flashes since he completed radiation therapy for prostate cancer.   He's been taking Nyquil, and day time cough syrup. Overall sleeping at night. Today he's feeling worse than he did two days ago.   Review of Systems  Constitutional:  Positive for fatigue. Negative for fever.  HENT:  Positive for congestion, postnasal drip and sinus pressure. Negative for sore throat.   Respiratory:  Positive for cough. Negative for shortness of breath.   Cardiovascular:  Negative for chest pain.  Neurological:  Positive for headaches.         Past Medical History:  Diagnosis Date   Allergy    Anxiety    Arthritis    Cataract    beginning stage bilateral   Diverticulosis    Elevated PSA    Esophageal stricture    Esophagitis    GERD (gastroesophageal reflux disease)    Gout    History of kidney stones    Hyperlipidemia    Hypertension    Inguinal hernia    Internal  hemorrhoids    Pancreatitis    Prostate cancer ([place])    Tubular adenoma of colon     Social History   Socioeconomic History   Marital status: Married    Spouse name: Not on file   Number of children: 1   Years of education: Not on file   Highest education level: Not on file  Occupational History   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 From [place], [place]. 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 Daughter, [name], lives in [place]. No pets.      Exercise: regular   Diet: increased sweets   Social Determinants of Health   Financial Resource Strain: Low Risk  (10/13/2020)   Overall Financial Resource Strain (CARDIA)    Difficulty of Paying Living Expenses: Not very hard  Food Insecurity: No Food Insecurity (10/13/2020)   Hunger Vital Sign    Worried About Running Out of Food in the Last Year: Never true    Ran Out of Food in the Last Year:  Never true  Transportation Needs: No Transportation Needs (10/13/2020)   PRAPARE - Hydrologist (Medical): No    Lack of Transportation (Non-Medical): No  Physical Activity: Sufficiently Active (10/13/2020)   Exercise Vital Sign    Days of Exercise per Week: 5 days    Minutes of Exercise per Session: 30 min  Stress: No Stress Concern Present (10/13/2020)   [place]    Feeling of Stress : Not at all  Social Connections: Unknown (10/13/2020)   Social Connection and Isolation Panel [NHANES]    Frequency of Communication with Friends and Family: Not on file    Frequency of Social Gatherings with Friends and  Family: Not on file    Attends Religious Services: Not on file    Active Member of Clubs or Organizations: Not on file    Attends Archivist Meetings: Not on file    Marital Status: Married  Intimate Partner Violence: Not At Risk (10/13/2020)   Humiliation, Afraid, Rape, and Kick questionnaire    Fear of Current or Ex-Partner: No    Emotionally Abused: No    Physically Abused: No    Sexually Abused: No    Past Surgical History:  Procedure Laterality Date   BACK SURGERY  1980's   L4/5   BALLOON DILATION N/A 11/17/2018   Procedure: [place];  Surgeon: Hollice Espy, MD;  Location: [facility];  Service: Urology;  Laterality: N/A;   CARPAL TUNNEL RELEASE Right 08/15/2016   Procedure: CARPAL TUNNEL RELEASE ENDOSCOPIC;  Surgeon: Corky Mull, MD;  Location: [place];  Service: Orthopedics;  Laterality: Right;   CATARACT EXTRACTION W/PHACO Right 03/14/2021   Procedure: CATARACT EXTRACTION PHACO AND INTRAOCULAR LENS PLACEMENT (I[facility]) RIGHT 6.84 00:52.3;  Surgeon: Birder Robson, MD;  Location: [place];  Service: Ophthalmology;  Laterality: Right;   CATARACT EXTRACTION W/PHACO Left 03/28/2021   Procedure: CATARACT EXTRACTION PHACO AND INTRAOCULAR LENS PLACEMENT ([place]) LEFT;  Surgeon: Birder Robson, MD;  Location: [place];  Service: Ophthalmology;  Laterality: Left;   COLONOSCOPY     CYSTOSCOPY N/A 11/17/2018   Procedure: CYSTOSCOPY;  Surgeon: Hollice Espy, MD;  Location: [facility];  Service: Urology;  Laterality: N/A;   CYSTOSCOPY WITH LITHOLAPAXY N/A 10/09/2017   Procedure: CYSTOSCOPY WITH LITHOLAPAXY;  Surgeon: Hollice Espy, MD;  Location: [facility];  Service: Urology;  Laterality: N/A;   ESOPHAGOGASTRODUODENOSCOPY  11-21-06   Esophagitis, Stricture/dilated    HOLEP-LASER ENUCLEATION OF THE PROSTATE WITH MORCELLATION N/A 10/09/2017   Procedure: HOLEP-LASER ENUCLEATION OF THE PROSTATE WITH MORCELLATION;  Surgeon: Hollice Espy,  MD;  Location: [facility];  Service: Urology;  Laterality: N/A;   LYMPH NODE BIOPSY  1957   benign  lymph node removed   Right arm fracture     TONSILLECTOMY     TOTAL KNEE ARTHROPLASTY  09-02-02   Bilateral   UPPER GASTROINTESTINAL ENDOSCOPY      Family History  Problem Relation Age of Onset   Ulcers Father    Diverticulitis Father    Arthritis Father    Hypertension Brother    Obesity Brother    Cancer Brother        throat   Arthritis Mother    Depression Mother        Anxiety   Colon cancer Neg Hx    Colon polyps Neg Hx    Esophageal cancer Neg Hx    Rectal cancer Neg Hx  Stomach cancer Neg Hx    Kidney disease Neg Hx    Kidney cancer Neg Hx    Prostate cancer Neg Hx     Allergies  Allergen Reactions   Penicillins Rash    Did it involve swelling of the face/tongue/throat, SOB, or low BP? No Did it involve sudden or severe rash/hives, skin peeling, or any reaction on the inside of your mouth or nose? No Did you need to seek medical attention at a hospital or doctor's office? No When did it last happen?      50 Years If all above answers are "NO", may proceed with cephalosporin use.    Acetaminophen Itching   Aspirin Itching    OK w/low dose aspirin     Current Outpatient Medications on File Prior to Visit  Medication Sig Dispense Refill   aspirin 81 MG tablet Take 81 mg by mouth daily.     metoprolol succinate (TOPROL-XL) 50 MG 24 hr tablet TAKE 1 TABLET BY MOUTH EVERY DAY 90 tablet 1   Misc Natural Products (TART CHERRY ADVANCED PO) Take 425 mg by mouth daily.     MITIGARE 0.6 MG CAPS TAKE 0.6 MG BY MOUTH DAILY AS NEEDED (GOUT FLARE). 90 capsule 1   Multiple Vitamins-Minerals ([employer] SPECTRAVITE SENIOR) TABS Take 1 tablet by mouth daily.      pantoprazole (PROTONIX) 40 MG tablet TAKE 1 TABLET BY MOUTH TWICE A DAY 180 tablet 3   tamsulosin (FLOMAX) 0.4 MG CAPS capsule Take by mouth.     traMADol (ULTRAM) 50 MG tablet      valsartan (DIOVAN) 80 MG tablet Take 1  tablet (80 mg total) by mouth daily. 90 tablet 3   Wheat Dextrin (BENEFIBER DRINK MIX PO) Take by mouth.     docusate sodium (COLACE) 100 MG capsule Take 100 mg by mouth 2 (two) times daily.     Current Facility-Administered Medications on File Prior to Visit  Medication Dose Route Frequency Provider Last Rate Last Admin   0.9 %  sodium chloride infusion  500 mL Intravenous Once Armbruster, Carlota Raspberry, MD        BP 136/78   Pulse 76   Temp 98.6 F (37 C) (Oral)   Ht '5\' 8"'$  (1.727 m)   Wt 257 lb (116.6 kg)   SpO2 98%   BMI 39.08 kg/m  Objective:   Physical Exam Constitutional:      Appearance: He is ill-appearing.  HENT:     Right Ear: Tympanic membrane and ear canal normal.     Left Ear: Tympanic membrane and ear canal normal.     Nose: No mucosal edema.     Right Sinus: No maxillary sinus tenderness or frontal sinus tenderness.     Left Sinus: No maxillary sinus tenderness or frontal sinus tenderness.  Eyes:     Conjunctiva/sclera: Conjunctivae normal.  Cardiovascular:     Rate and Rhythm: Normal rate and regular rhythm.  Pulmonary:     Effort: Pulmonary effort is normal.     Breath sounds: Normal breath sounds. No wheezing or rales.     Comments: No cough during visit Musculoskeletal:     Cervical back: Neck supple.  Skin:    General: Skin is warm and dry.           Assessment & Plan:   Problem List Items Addressed This Visit       Other   Acute cough - Primary    Worsening. Appears acutely fatigued. Exam  without obvious signs of pneumonia.  Given duration of symptoms, coupled with presentation and worsening of sypmtoms, will treat for presumed bacterial involvement.   PCN allergy. Start azithromycin course x 5 days. Instructions provided. Rx for [employer] provided.   Return precautions with PCP provided.  He appears stable for outpatient treatment.       Relevant Medications   azithromycin (ZITHROMAX) 250 MG tablet   benzonatate (TESSALON)  200 MG capsule       Pleas Koch, NP

## 2021-10-24 NOTE — Patient Instructions (Addendum)
  [name] , Thank you for taking time to come for your Medicare Wellness Visit. I appreciate your ongoing commitment to your health goals. Please review the following plan we discussed and let me know if I can assist you in the future.   These are the goals we discussed:  Goals      Maintain Healthy Lifestyle     Stay active Healthy diet        This is a list of the screening recommended for you and due dates:  Health Maintenance  Topic Date Due   Flu Shot  10/03/2021   COVID-19 Vaccine (5 - Pfizer risk series) 11/09/2021*   Tetanus Vaccine  08/12/2024   Pneumonia Vaccine  Completed   Zoster (Shingles) Vaccine  Completed   HPV Vaccine  Aged Out  *Topic was postponed. The date shown is not the original due date.

## 2021-10-24 NOTE — Progress Notes (Cosign Needed Addendum)
Subjective:   [name] is a 81 y.o. male who presents for Medicare Annual/Subsequent preventive examination.  Review of Systems    No ROS.  Medicare Wellness Virtual Visit.  Visual/audio telehealth visit, UTA vital signs.   See social history for additional risk factors.   Cardiac Risk Factors include: advanced age (>40mn, >>79women);male gender;hypertension     Objective:    Today's Vitals   10/24/21 1406  Weight: 257 lb (116.6 kg)  Height: '5\' 8"'$  (1.727 m)   Body mass index is 39.08 kg/m.     10/24/2021    2:02 PM 09/01/2021   11:50 AM 06/05/2021    3:04 AM 06/03/2021    1:01 PM 05/18/2021   10:58 AM 03/28/2021    8:50 AM 03/14/2021    9:54 AM  Advanced Directives  Does Patient Have a Medical Advance Directive? No No No No No    Type of Advance Directive      Healthcare Power of ANew [place] Does patient want to make changes to medical advance directive?       No - Patient declined  Copy of [place] Chart?       No - copy requested  Would patient like information on creating a medical advance directive? No - Patient declined  No - Patient declined No - Patient declined No - Patient declined      Current Medications (verified) Outpatient Encounter Medications as of 10/24/2021  Medication Sig   aspirin 81 MG tablet Take 81 mg by mouth daily.   azithromycin (ZITHROMAX) 250 MG tablet Take 2 tablets by mouth today, then 1 tablet daily for 4 additional days.   benzonatate (TESSALON) 200 MG capsule Take 1 capsule (200 mg total) by mouth 3 (three) times daily as needed for cough.   metoprolol succinate (TOPROL-XL) 50 MG 24 hr tablet TAKE 1 TABLET BY MOUTH EVERY DAY   Misc Natural Products (TART CHERRY ADVANCED PO) Take 425 mg by mouth daily.   MITIGARE 0.6 MG CAPS TAKE 0.6 MG BY MOUTH DAILY AS NEEDED (GOUT FLARE).   Multiple Vitamins-Minerals (CVS SPECTRAVITE SENIOR) TABS Take 1 tablet by mouth daily.    pantoprazole (PROTONIX)  40 MG tablet TAKE 1 TABLET BY MOUTH TWICE A DAY   tamsulosin (FLOMAX) 0.4 MG CAPS capsule Take by mouth.   valsartan (DIOVAN) 80 MG tablet Take 1 tablet (80 mg total) by mouth daily.   Wheat Dextrin (BENEFIBER DRINK MIX PO) Take by mouth.   [DISCONTINUED] docusate sodium (COLACE) 100 MG capsule Take 100 mg by mouth 2 (two) times daily.   [DISCONTINUED] traMADol (ULTRAM) 50 MG tablet    Facility-Administered Encounter Medications as of 10/24/2021  Medication   0.9 %  sodium chloride infusion    Allergies (verified) Penicillins, Acetaminophen, and Aspirin   History: Past Medical History:  Diagnosis Date   Allergy    Anxiety    Arthritis    Cataract    beginning stage bilateral   Diverticulosis    Elevated PSA    Esophageal stricture    Esophagitis    GERD (gastroesophageal reflux disease)    Gout    History of kidney stones    Hyperlipidemia    Hypertension    Inguinal hernia    Internal hemorrhoids    Pancreatitis    Prostate cancer ([place]    Tubular adenoma of colon    Past Surgical History:  Procedure Laterality Date   BACK  SURGERY  1980's   L4/5   BALLOON DILATION N/A 11/17/2018   Procedure: URETHRAL BALLOON DILATION;  Surgeon: Hollice Espy, MD;  Location: [facility];  Service: Urology;  Laterality: N/A;   CARPAL TUNNEL RELEASE Right 08/15/2016   Procedure: CARPAL TUNNEL RELEASE ENDOSCOPIC;  Surgeon: Corky Mull, MD;  Location: [place];  Service: Orthopedics;  Laterality: Right;   CATARACT EXTRACTION W/PHACO Right 03/14/2021   Procedure: CATARACT EXTRACTION PHACO AND INTRAOCULAR LENS PLACEMENT (IOC) RIGHT 6.84 00:52.3;  Surgeon: Birder Robson, MD;  Location: [place];  Service: Ophthalmology;  Laterality: Right;   CATARACT EXTRACTION W/PHACO Left 03/28/2021   Procedure: CATARACT EXTRACTION PHACO AND INTRAOCULAR LENS PLACEMENT ([place]) LEFT;  Surgeon: Birder Robson, MD;  Location: [place];  Service: Ophthalmology;  Laterality:  Left;   COLONOSCOPY     CYSTOSCOPY N/A 11/17/2018   Procedure: CYSTOSCOPY;  Surgeon: Hollice Espy, MD;  Location: [facility];  Service: Urology;  Laterality: N/A;   CYSTOSCOPY WITH LITHOLAPAXY N/A 10/09/2017   Procedure: CYSTOSCOPY WITH LITHOLAPAXY;  Surgeon: Hollice Espy, MD;  Location: [facility];  Service: Urology;  Laterality: N/A;   ESOPHAGOGASTRODUODENOSCOPY  11-21-06   Esophagitis, Stricture/dilated    HOLEP-LASER ENUCLEATION OF THE PROSTATE WITH MORCELLATION N/A 10/09/2017   Procedure: HOLEP-LASER ENUCLEATION OF THE PROSTATE WITH MORCELLATION;  Surgeon: Hollice Espy, MD;  Location: [facility];  Service: Urology;  Laterality: N/A;   LYMPH NODE BIOPSY  1957   benign  lymph node removed   Right arm fracture     TONSILLECTOMY     TOTAL KNEE ARTHROPLASTY  09-02-02   Bilateral   UPPER GASTROINTESTINAL ENDOSCOPY     Family History  Problem Relation Age of Onset   Ulcers Father    Diverticulitis Father    Arthritis Father    Hypertension Brother    Obesity Brother    Cancer Brother        throat   Arthritis Mother    Depression Mother        Anxiety   Colon cancer Neg Hx    Colon polyps Neg Hx    Esophageal cancer Neg Hx    Rectal cancer Neg Hx    Stomach cancer Neg Hx    Kidney disease Neg Hx    Kidney cancer Neg Hx    Prostate cancer Neg Hx    Social History   Socioeconomic History   Marital status: Married    Spouse name: Not on file   Number of children: 1   Years of education: Not on file   Highest education level: Not on file  Occupational History   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 From [place], [place]. Retired from Charity fundraiser, works at  [employer] full time now. Daughter, [name], lives in [place]. No pets.      Exercise: regular   Diet: increased sweets   Social Determinants of Health   Financial Resource Strain: Low Risk  (10/24/2021)   Overall Financial Resource Strain (CARDIA)    Difficulty of Paying Living Expenses: Not hard at all  Food Insecurity: No Food  Insecurity (10/24/2021)   Hunger Vital Sign    Worried About Running Out of Food in the Last Year: Never true    Ran Out of Food in the Last Year: Never true  Transportation Needs: No Transportation Needs (10/24/2021)   PRAPARE - Hydrologist (Medical): No    Lack of Transportation (Non-Medical): No  Physical Activity: Sufficiently Active (10/24/2021)   Exercise Vital Sign    Days of Exercise per Week: 5 days    Minutes of Exercise per Session: 30 min  Stress: No Stress Concern Present (10/24/2021)   [place]    Feeling of Stress : Not at all  Social Connections: Unknown (10/24/2021)   Social Connection and Isolation Panel [NHANES]    Frequency of Communication with Friends and Family: Not on file    Frequency of Social Gatherings with Friends and Family: Not on file    Attends Religious Services: Not on file    Active Member of Clubs or Organizations: Not on file    Attends Archivist Meetings: Not on file    Marital Status: Married    Tobacco Counseling Counseling given: Not Answered   Clinical Intake:  Pre-visit preparation completed: Yes        Diabetes: No  How often do you need to have someone help you when you read instructions, pamphlets, or other written materials from your doctor or pharmacy?: 1 - Never   Interpreter Needed?: No      Activities of Daily Living    10/24/2021    2:48 PM 03/14/2021    9:48 AM  In your present state of health, do you have any difficulty performing the following activities:  Hearing? 0 0   Vision? 0 0  Difficulty concentrating or making decisions? 0 0  Walking or climbing stairs? 0 0  Dressing or bathing? 0 0  Doing errands, shopping? 0   Preparing Food and eating ? N   Using the Toilet? N   Comment Followed by Urology   Do you have problems with loss of bowel control? N   Managing your Medications? N   Managing your Finances? N   Housekeeping or managing your Housekeeping? N     Patient Care Team: Leone Haven, MD as PCP - General (Family Medicine) Dasher, Rayvon Char, MD (Dermatology) Birder Robson, MD as Referring Physician (Ophthalmology) Gaynelle Arabian, MD (Orthopedic Surgery)  Indicate any recent Medical Services you may have received from other than [facility] providers in the past year (date may be approximate).     Assessment:   This is a routine wellness examination for [facility].  Virtual Visit via Telephone Note  I connected with  [name] on 10/24/21 at  2:15 PM EDT by telephone and verified that I am speaking with the correct person using two identifiers.  Location: Patient: home  Provider: office Persons participating in the virtual visit: patient/Nurse Health Advisor   I discussed the limitations of performing an evaluation and management service by telehealth. We continued and completed visit with audio only. Some vital signs may be absent or patient reported.   Hearing/Vision screen Hearing Screening - Comments:: Patient is able to hear conversational tones without difficulty.  No issues reported. Vision Screening - Comments:: Followed by [facility], Dr. George Ina  Wears corrective lenses  They have seen their ophthalmologist in the last 12 months.   Dietary issues and exercise activities discussed: Current Exercise  Habits: Home exercise routine, Type of exercise: walking, Intensity: Mild Regular diet Good water intake   Goals Addressed             This Visit's Progress    Maintain Healthy Lifestyle       Stay  active Healthy diet       Depression Screen    10/24/2021    2:46 PM 08/15/2021    8:11 AM 06/14/2021    4:24 PM 10/13/2020   10:37 AM 08/16/2020    8:32 AM 02/04/2020   12:12 PM 10/13/2019   10:50 AM  PHQ 2/9 Scores  PHQ - 2 Score 0 0 0 0 0 0 0    Fall Risk    10/24/2021    2:47 PM 08/15/2021    8:11 AM 10/13/2020   10:40 AM 08/16/2020    8:32 AM 02/04/2020   12:11 PM  Fall Risk   Falls in the past year? 0 0 0 0 0  Number falls in past yr: 0 0  0 0  Injury with Fall?  0     Risk for fall due to :  No Fall Risks     Follow up Falls evaluation completed Falls evaluation completed Falls evaluation completed Falls evaluation completed Falls evaluation completed    [place]: Home free of loose throw rugs in walkways, pet beds, electrical cords, etc? Yes  Adequate lighting in your home to reduce risk of falls? Yes   ASSISTIVE DEVICES UTILIZED TO PREVENT FALLS: Life alert? No  Use of a cane, [place] or w/c? No  Grab bars in the bathroom? Yes  Shower chair or bench in shower? Yes  Comfort chair height toilet? Yes   TIMED UP AND GO: Was the test performed? No .   Cognitive Function: Patient is alert and oriented x3.  Enjoys brain health stimulating activities.      10/13/2019   10:53 AM 10/07/2017   11:18 AM 10/15/2016   10:27 AM 10/12/2015    2:28 PM  MMSE - Mini Mental State Exam  Not completed: Unable to complete     Orientation to time  '5 5 5  '$ Orientation to Place  '5 5 5  '$ Registration  '3 3 3  '$ Attention/ Calculation  '5 5 5  '$ Recall  '3 3 3  '$ Language- name 2 objects  '2 2 2  '$ Language- repeat  '1 1 1  '$ Language- follow 3 step command  '3 3 3  '$ Language- read & follow direction  '1 1 1  '$ Write a sentence  '1 1 1  '$ Copy design  '1 1 1  '$ Total score  '30 30 30        '$ 10/24/2021    2:48 PM 10/10/2018   10:47 AM  6CIT Screen  What Year? 0 points 0 points  What month? 0 points 0 points  What time?  0 points  Count back from 20  0 points  Months in  reverse 0 points 0 points  Repeat phrase  0 points  Total Score  0 points    Immunizations Immunization History  Administered Date(s) Administered   Fluad Quad(high Dose 65+) 11/26/2018, 01/09/2019, 11/23/2020   Influenza Whole 01/03/2006, 12/24/2011   Influenza, High Dose Seasonal PF 10/20/2016, 11/03/2017, 12/03/2017, 11/26/2019   Influenza,inj,Quad PF,6+ Mos 12/15/2012, 10/28/2014   Influenza-Unspecified 12/25/2013, 11/08/2015, 10/20/2016   PFIZER(Purple Top)SARS-COV-2 Vaccination 03/26/2019, 04/16/2019, 12/07/2019, 07/01/2020   Pneumococcal Conjugate-13 06/30/2013   Pneumococcal Polysaccharide-23 06/21/2006  Td 08/19/2002   Tdap 08/13/2014   Zoster Recombinat (Shingrix) 10/20/2018, 01/09/2019   Zoster, Live 12/28/2009   Screening Tests Health Maintenance  Topic Date Due   INFLUENZA VACCINE  10/03/2021   COVID-19 Vaccine (5 - Pfizer risk series) 11/09/2021 (Originally 08/26/2020)   TETANUS/TDAP  08/12/2024   Pneumonia Vaccine 56+ Years old  Completed   Zoster Vaccines- Shingrix  Completed   HPV VACCINES  Aged Out   Health Maintenance Health Maintenance Due  Topic Date Due   INFLUENZA VACCINE  10/03/2021   Lung Cancer Screening: (Low Dose CT Chest recommended if Age 42-80 years, 30 pack-year currently smoking OR have quit w/in 15years.) does not qualify.   Hepatitis C Screening: does not qualify.  Vision Screening: Recommended annual ophthalmology exams for early detection of glaucoma and other disorders of the eye.  Dental Screening: Recommended annual dental exams for proper oral hygiene  Community Resource Referral / Chronic Care Management: CRR required this visit?  No   CCM required this visit?  No      Plan:     I have personally reviewed and noted the following in the patient's chart:   Medical and social history Use of alcohol, tobacco or illicit drugs  Current medications and supplements including opioid prescriptions. Patient is not currently  taking opioid prescriptions. Functional ability and status Nutritional status Physical activity Advanced directives List of other physicians Hospitalizations, surgeries, and ER visits in previous 12 months Vitals Screenings to include cognitive, depression, and falls Referrals and appointments  In addition, I have reviewed and discussed with patient certain preventive protocols, quality metrics, and best practice recommendations. A written personalized care plan for preventive services as well as general preventive health recommendations were provided to patient.     Varney Biles, LPN   [id number]

## 2021-10-24 NOTE — Patient Instructions (Addendum)
Start Azithromycin antibiotics for infection. Take 2 tablets by mouth today, then 1 tablet daily for 4 additional days.  You may take Benzonatate capsules for cough. Take 1 capsule by mouth three times daily as needed for cough.  Nasal Congestion/Ear Pressure/Sinus Pressure: Try using Flonase (fluticasone) nasal spray. Instill 1 spray in each nostril twice daily.   Please follow up with Dr. Caryl Bis if no improvement.   It was a pleasure meeting you!

## 2021-10-31 ENCOUNTER — Ambulatory Visit: Payer: Medicare PPO | Admitting: Family

## 2021-11-14 ENCOUNTER — Encounter: Payer: Self-pay | Admitting: Family Medicine

## 2021-11-14 DIAGNOSIS — M9903 Segmental and somatic dysfunction of lumbar region: Secondary | ICD-10-CM | POA: Diagnosis not present

## 2021-11-14 DIAGNOSIS — M5136 Other intervertebral disc degeneration, lumbar region: Secondary | ICD-10-CM | POA: Diagnosis not present

## 2021-11-14 DIAGNOSIS — M9901 Segmental and somatic dysfunction of cervical region: Secondary | ICD-10-CM | POA: Diagnosis not present

## 2021-11-14 DIAGNOSIS — M5033 Other cervical disc degeneration, cervicothoracic region: Secondary | ICD-10-CM | POA: Diagnosis not present

## 2021-11-15 MED ORDER — VALSARTAN 160 MG PO TABS: 160.0000 mg | ORAL_TABLET | Freq: Every day | ORAL | 3 refills | Status: DC

## 2021-11-20 ENCOUNTER — Ambulatory Visit: Payer: Medicare PPO | Admitting: Family Medicine

## 2021-11-20 ENCOUNTER — Encounter: Payer: Self-pay | Admitting: Family Medicine

## 2021-11-20 VITALS — BP 140/80 | HR 66 | Temp 98.0°F | Ht 68.0 in | Wt 267.0 lb

## 2021-11-20 DIAGNOSIS — I10 Essential (primary) hypertension: Secondary | ICD-10-CM

## 2021-11-20 DIAGNOSIS — Z23 Encounter for immunization: Secondary | ICD-10-CM | POA: Diagnosis not present

## 2021-11-20 NOTE — Patient Instructions (Signed)
Nice to see you. We will contact you with your lab results. If your blood pressure drops below 100/60 or you start to feel lightheaded or if your blood pressure starts to trend back up please let us know.

## 2021-11-20 NOTE — Progress Notes (Signed)
Duane Rumps, MD Phone: [phone]  [name] is a 81 y.o. male who presents today for f/u  HYPERTENSION Disease Monitoring Home BP Monitoring trending down from the 027O systolic last week Chest pain- no    Dyspnea- no Medications Compliance-  taking metoprolol and valsartan, we increased the valsartan to 160 mg daily last week.  Edema- no change to chronic edema BMET    Component Value Date/Time   NA 137 09/01/2021 1201   NA 137 09/01/2013 0410   K 3.9 09/01/2021 1201   K 3.6 09/01/2013 0410   CL 102 09/01/2021 1201   CL 106 09/01/2013 0410   CO2 25 09/01/2021 1201   CO2 23 09/01/2013 0410   GLUCOSE 118 (H) 09/01/2021 1201   GLUCOSE 107 (H) 09/01/2013 0410   BUN 23 09/01/2021 1201   BUN 13 06/24/2017 1039   BUN 14 09/01/2013 0410   CREATININE 1.50 (H) 09/01/2021 1201   CREATININE 1.40 (H) 09/01/2013 0410   CALCIUM 9.1 09/01/2021 1201   CALCIUM 8.0 (L) 09/01/2013 0410   GFRNONAA 46 (L) 09/01/2021 1201   GFRNONAA 49 (L) 09/01/2013 0410   GFRAA 57 (L) 10/04/2017 1306   GFRAA 57 (L) 09/01/2013 0410     Social History   Tobacco Use  Smoking Status Never  Smokeless Tobacco Never    Current Outpatient Medications on File Prior to Visit  Medication Sig Dispense Refill   aspirin 81 MG tablet Take 81 mg by mouth daily.     azithromycin (ZITHROMAX) 250 MG tablet Take 2 tablets by mouth today, then 1 tablet daily for 4 additional days. 6 tablet 0   benzonatate (TESSALON) 200 MG capsule Take 1 capsule (200 mg total) by mouth 3 (three) times daily as needed for cough. 15 capsule 0   metoprolol succinate (TOPROL-XL) 50 MG 24 hr tablet TAKE 1 TABLET BY MOUTH EVERY DAY 90 tablet 1   Misc Natural Products (TART CHERRY ADVANCED PO) Take 425 mg by mouth daily.     MITIGARE 0.6 MG CAPS TAKE 0.6 MG BY MOUTH DAILY AS NEEDED (GOUT FLARE). 90 capsule 1   Multiple Vitamins-Minerals (CVS SPECTRAVITE SENIOR) TABS Take 1 tablet by mouth daily.      pantoprazole (PROTONIX) 40 MG  tablet TAKE 1 TABLET BY MOUTH TWICE A DAY 180 tablet 3   tamsulosin (FLOMAX) 0.4 MG CAPS capsule Take by mouth.     valsartan (DIOVAN) 160 MG tablet Take 1 tablet (160 mg total) by mouth daily. 90 tablet 3   Wheat Dextrin (BENEFIBER DRINK MIX PO) Take by mouth.     Current Facility-Administered Medications on File Prior to Visit  Medication Dose Route Frequency Provider Last Rate Last Admin   0.9 %  sodium chloride infusion  500 mL Intravenous Once Armbruster, Carlota Raspberry, MD         ROS see history of present illness  Objective  Physical Exam Vitals:   11/20/21 1507  BP: (!) 140/80  Pulse: 66  Temp: 98 F (36.7 C)  SpO2: 99%    BP Readings from Last 3 Encounters:  11/20/21 (!) 140/80  10/24/21 136/78  09/18/21 (!) 169/89   Wt Readings from Last 3 Encounters:  11/20/21 267 lb (121.1 kg)  10/24/21 257 lb (116.6 kg)  10/24/21 257 lb (116.6 kg)    Physical Exam Constitutional:      General: He is not in acute distress.    Appearance: He is not diaphoretic.  Cardiovascular:     Rate and  Rhythm: Normal rate and regular rhythm.     Heart sounds: Normal heart sounds.  Pulmonary:     Effort: Pulmonary effort is normal.     Breath sounds: Normal breath sounds.  Skin:    General: Skin is warm and dry.  Neurological:     Mental Status: He is alert.      Assessment/Plan: Please see individual problem list.  Problem List Items Addressed This Visit     Essential hypertension - Primary (Chronic)    BP is adequately controlled for his age. He will continue valsartan 160 mg daily and metoprolol 50 mg daily. Check BMET today. He will continue to monitor his blood pressure and if it drops below less than 100/60 or if it starts to trend back up he will let us know.  He will follow-up in December as planned.      Relevant Orders   Basic Metabolic Panel (BMET)   Other Visit Diagnoses     Need for immunization against influenza       Relevant Orders   Flu Vaccine QUAD High  Dose(Fluad) (Completed)      Return for As scheduled.   Duane Rumps, MD [place]

## 2021-11-20 NOTE — Assessment & Plan Note (Signed)
BP is adequately controlled for his age. He will continue valsartan 160 mg daily and metoprolol 50 mg daily. Check BMET today. He will continue to monitor his blood pressure and if it drops below less than 100/60 or if it starts to trend back up he will let us know.  He will follow-up in December as planned.

## 2021-11-21 LAB — BASIC METABOLIC PANEL WITH GFR
BUN: 14 mg/dL (ref 6–23)
CO2: 27 meq/L (ref 19–32)
Calcium: 8.8 mg/dL (ref 8.4–10.5)
Chloride: 105 meq/L (ref 96–112)
Creatinine, Ser: 1.24 mg/dL (ref 0.40–1.50)
GFR: 54.28 mL/min — ABNORMAL LOW
Glucose, Bld: 108 mg/dL — ABNORMAL HIGH (ref 70–99)
Potassium: 3.7 meq/L (ref 3.5–5.1)
Sodium: 140 meq/L (ref 135–145)

## 2021-11-23 ENCOUNTER — Other Ambulatory Visit: Payer: Self-pay | Admitting: Physician Assistant

## 2021-11-23 DIAGNOSIS — R310 Gross hematuria: Secondary | ICD-10-CM

## 2021-11-26 ENCOUNTER — Other Ambulatory Visit: Payer: Self-pay | Admitting: Family Medicine

## 2021-11-26 DIAGNOSIS — I10 Essential (primary) hypertension: Secondary | ICD-10-CM

## 2021-11-27 ENCOUNTER — Encounter: Payer: Self-pay | Admitting: Family Medicine

## 2021-11-27 NOTE — Telephone Encounter (Signed)
Discussed with patient while he was in the office with his wife for her appointment.  He notes the CMA checked his blood pressure while he was in the office and was 130/70.  He just bought a new blood pressure cuff.  Discussed he should be checking his blood pressure 2 hours after taking his medication.  Advised that he should schedule a nurse visit on the way out so he could bring in his new blood pressure cuff and see if it is accurate.  Discussed if it is accurate we will need to alter his medication dosage.

## 2021-11-28 ENCOUNTER — Other Ambulatory Visit: Payer: Self-pay | Admitting: *Deleted

## 2021-11-28 DIAGNOSIS — C61 Malignant neoplasm of prostate: Secondary | ICD-10-CM

## 2021-12-04 ENCOUNTER — Encounter: Payer: Self-pay | Admitting: Family Medicine

## 2021-12-05 ENCOUNTER — Telehealth (INDEPENDENT_AMBULATORY_CARE_PROVIDER_SITE_OTHER): Payer: Medicare PPO | Admitting: Family Medicine

## 2021-12-05 VITALS — Ht 68.0 in | Wt 267.0 lb

## 2021-12-05 DIAGNOSIS — U071 COVID-19: Secondary | ICD-10-CM | POA: Diagnosis not present

## 2021-12-05 MED ORDER — BENZONATATE 100 MG PO CAPS: ORAL_CAPSULE | ORAL | 0 refills | Status: DC

## 2021-12-05 MED ORDER — NIRMATRELVIR/RITONAVIR (PAXLOVID) TABLET (RENAL DOSING): 2.0000 | ORAL_TABLET | Freq: Two times a day (BID) | ORAL | 0 refills | Status: AC

## 2021-12-05 NOTE — Progress Notes (Signed)
Virtual Visit via Video Note  I connected with [name]  on 12/05/21 at  4:00 PM EDT by a video enabled telemedicine application and verified that I am speaking with the correct person using two identifiers.  Location patient: [place] Location provider:work or home office Persons participating in the virtual visit: patient, provider  I discussed the limitations and requested verbal permission for telemedicine visit. The patient expressed understanding and agreed to proceed.   HPI:  Acute telemedicine visit for Covid19: -Onset: 2 days ago, tested positive yesterday -Symptoms include: cough, feels week, nasal congestion, diarrhea, body aches, mild ha, mild SOB at times but reports breathing is ok and O2 is ok -BP tends to run high at home 170s, but normal at doctor office -Denies: fever, CP, vomiting, difficulty breathing -Has tried: dayquil, otc cough medication -Pertinent past medical history: see below, has had covid in the past, GFR 54 a few weeks ago -Pertinent medication allergies: Allergies  Allergen Reactions   Penicillins Rash    Did it involve swelling of the face/tongue/throat, SOB, or low BP? No Did it involve sudden or severe rash/hives, skin peeling, or any reaction on the inside of your mouth or nose? No Did you need to seek medical attention at a hospital or doctor's office? No When did it last happen?      50 Years If all above answers are "NO", may proceed with cephalosporin use.    Acetaminophen Itching   Aspirin Itching    OK w/low dose aspirin   -COVID-19 vaccine status:  Immunization History  Administered Date(s) Administered   Fluad Quad(high Dose 65+) 11/26/2018, 01/09/2019, 11/23/2020, 11/20/2021   Influenza Whole 01/03/2006, 12/24/2011   Influenza, High Dose Seasonal PF 10/20/2016, 11/03/2017, 12/03/2017, 11/26/2019   Influenza,inj,Quad PF,6+ Mos 12/15/2012, 10/28/2014   Influenza-Unspecified 12/25/2013, 11/08/2015, 10/20/2016   PFIZER(Purple Top)SARS-COV-2  Vaccination 03/26/2019, 04/16/2019, 12/07/2019, 07/01/2020   Pneumococcal Conjugate-13 06/30/2013   Pneumococcal Polysaccharide-23 06/21/2006   Td 08/19/2002   Tdap 08/13/2014   Zoster Recombinat (Shingrix) 10/20/2018, 01/09/2019   Zoster, Live 12/28/2009     ROS: See pertinent positives and negatives per HPI.  Past Medical History:  Diagnosis Date   Allergy    Anxiety    Arthritis    Cataract    beginning stage bilateral   Diverticulosis    Elevated PSA    Esophageal stricture    Esophagitis    GERD (gastroesophageal reflux disease)    Gout    History of kidney stones    Hyperlipidemia    Hypertension    Inguinal hernia    Internal hemorrhoids    Pancreatitis    Prostate cancer ([place])    Tubular adenoma of colon     Past Surgical History:  Procedure Laterality Date   BACK SURGERY  1980's   L4/5   BALLOON DILATION N/A 11/17/2018   Procedure: URETHRAL BALLOON DILATION;  Surgeon: Hollice Espy, MD;  Location: [facility];  Service: Urology;  Laterality: N/A;   CARPAL TUNNEL RELEASE Right 08/15/2016   Procedure: CARPAL TUNNEL RELEASE ENDOSCOPIC;  Surgeon: Corky Mull, MD;  Location: [place];  Service: Orthopedics;  Laterality: Right;   CATARACT EXTRACTION W/PHACO Right 03/14/2021   Procedure: CATARACT EXTRACTION PHACO AND INTRAOCULAR LENS PLACEMENT (I[facility]) RIGHT 6.84 00:52.3;  Surgeon: Birder Robson, MD;  Location: [place];  Service: Ophthalmology;  Laterality: Right;   CATARACT EXTRACTION W/PHACO Left 03/28/2021   Procedure: CATARACT EXTRACTION PHACO AND INTRAOCULAR LENS PLACEMENT ([place]) LEFT;  Surgeon: Birder Robson, MD;  Location: [place];  Service: Ophthalmology;  Laterality: Left;   COLONOSCOPY     CYSTOSCOPY N/A 11/17/2018   Procedure: CYSTOSCOPY;  Surgeon: Hollice Espy, MD;  Location: [facility];  Service: Urology;  Laterality: N/A;   CYSTOSCOPY WITH LITHOLAPAXY N/A 10/09/2017   Procedure: CYSTOSCOPY WITH LITHOLAPAXY;  Surgeon:  Hollice Espy, MD;  Location: [facility];  Service: Urology;  Laterality: N/A;   ESOPHAGOGASTRODUODENOSCOPY  11-21-06   Esophagitis, Stricture/dilated    HOLEP-LASER ENUCLEATION OF THE PROSTATE WITH MORCELLATION N/A 10/09/2017   Procedure: HOLEP-LASER ENUCLEATION OF THE PROSTATE WITH MORCELLATION;  Surgeon: Hollice Espy, MD;  Location: [facility];  Service: Urology;  Laterality: N/A;   LYMPH NODE BIOPSY  1957   benign  lymph node removed   Right arm fracture     TONSILLECTOMY     TOTAL KNEE ARTHROPLASTY  09-02-02   Bilateral   UPPER GASTROINTESTINAL ENDOSCOPY       Current Outpatient Medications:    aspirin 81 MG tablet, Take 81 mg by mouth daily., Disp: , Rfl:    azithromycin (ZITHROMAX) 250 MG tablet, Take 2 tablets by mouth today, then 1 tablet daily for 4 additional days., Disp: 6 tablet, Rfl: 0   benzonatate (TESSALON PERLES) 100 MG capsule, 1-2 capsules up to twice daily as needed for cough, Disp: 20 capsule, Rfl: 0   metoprolol succinate (TOPROL-XL) 50 MG 24 hr tablet, TAKE 1 TABLET BY MOUTH EVERY DAY, Disp: 90 tablet, Rfl: 1   Misc Natural Products (TART CHERRY ADVANCED PO), Take 425 mg by mouth daily., Disp: , Rfl:    MITIGARE 0.6 MG CAPS, TAKE 0.6 MG BY MOUTH DAILY AS NEEDED (GOUT FLARE)., Disp: 90 capsule, Rfl: 1   Multiple Vitamins-Minerals ([employer] SPECTRAVITE SENIOR) TABS, Take 1 tablet by mouth daily. , Disp: , Rfl:    nirmatrelvir/ritonavir EUA, renal dosing, (PAXLOVID) 10 x 150 MG & 10 x '100MG'$  TABS, Take 2 tablets by mouth 2 (two) times daily for 5 days. (Take nirmatrelvir 150 mg one tablet twice daily for 5 days and ritonavir 100 mg one tablet twice daily for 5 days) Patient GFR is 54, Disp: 20 tablet, Rfl: 0   pantoprazole (PROTONIX) 40 MG tablet, TAKE 1 TABLET BY MOUTH TWICE A DAY, Disp: 180 tablet, Rfl: 3   tamsulosin (FLOMAX) 0.4 MG CAPS capsule, Take by mouth., Disp: , Rfl:    valsartan (DIOVAN) 160 MG tablet, Take 1 tablet (160 mg total) by mouth daily., Disp: 90 tablet,  Rfl: 3   Wheat Dextrin (BENEFIBER DRINK MIX PO), Take by mouth., Disp: , Rfl:   Current Facility-Administered Medications:    0.9 %  sodium chloride infusion, 500 mL, Intravenous, Once, Armbruster, Carlota Raspberry, MD  EXAM:  VITALS per patient if applicable: O2 97, HR 82  GENERAL: alert, oriented, appears well and in no acute distress  HEENT: atraumatic, conjunttiva clear, no obvious abnormalities on inspection of external nose and ears  NECK: normal movements of the head and neck  LUNGS: on inspection no signs of respiratory distress, breathing rate appears normal, no obvious gross SOB, gasping or wheezing  CV: no obvious cyanosis  MS: moves all visible extremities without noticeable abnormality  PSYCH/NEURO: pleasant and cooperative, no obvious depression or anxiety, speech and thought processing grossly intact  ASSESSMENT AND PLAN:  Discussed the following assessment and plan:  COVID-19   Discussed treatment options, side effect and risk of drug interactions, ideal treatment window, potential complications, isolation and precautions for COVID-19.  Discussed possibility of rebound  with or without antivirals. Checked for/reviewed last GFR - listed in HPI if available. After lengthy discussion, the patient opted for treatment with renally dosed Paxlovid due to being higher risk for complications of covid or severe disease and other factors. Discussed EUA status of this drug and the fact that there is preliminary limited knowledge of risks/interactions/side effects per EUA document vs possible benefits and precautions. Discussed potential interactions with his arb, flomax, supplments and colchicine. He prefers to hold his tamsulosin as reports has done so at times without issue. He plans to monitor his BP but it tends to run on the high end so may not need to stop his valsartan. Agrees not to take the mitigare while on paxlovid. This information was shared with patient during the visit and  also was provided in patient instructions. Also, advised that patient discuss risks/interactions and use with pharmacist/treatment team as well. The patient did want a prescription for cough, Tessalon Rx sent.  Other symptomatic care measures summarized in patient instructions. Advised to seek prompt virtual visit or in person care if worsening, new symptoms arise, any further SOB or Ot<95 or if is not improving with treatment as expected per our conversation of expected course. Discussed options for follow up care. Did let this patient know that I do telemedicine on Tuesdays and Thursdays for [place] and those are the days I am logged into the system. Advised to schedule follow up visit with PCP, [place] virtual visits or [facility] if any further questions or concerns to avoid delays in care.   I discussed the assessment and treatment plan with the patient. The patient was provided an opportunity to ask questions and all were answered. The patient agreed with the plan and demonstrated an understanding of the instructions.     Lucretia Kern, DO

## 2021-12-05 NOTE — Patient Instructions (Addendum)
HOME CARE TIPS:   -I sent the medication(s) we discussed to your pharmacy: Meds ordered this encounter  Medications   nirmatrelvir/ritonavir EUA, renal dosing, (PAXLOVID) 10 x 150 MG & 10 x '100MG'$  TABS    Sig: Take 2 tablets by mouth 2 (two) times daily for 5 days. (Take nirmatrelvir 150 mg one tablet twice daily for 5 days and ritonavir 100 mg one tablet twice daily for 5 days) Patient GFR is 54    Dispense:  20 tablet    Refill:  0   benzonatate (TESSALON PERLES) 100 MG capsule    Sig: 1-2 capsules up to twice daily as needed for cough    Dispense:  20 capsule    Refill:  0     -I sent in the [place] treatment or referral you requested per our discussion. Please see the information provided below and discuss further with the pharmacist/treatment team.  -If taking Paxlovid, please review all medications, supplement and over the counter drugs with your pharmacist and ask them to check for any interactions. Please make the following changes to your regular medications while taking Paxlovid: *do not take your gout medication while taking Paxlovid *Take your Tamsulosin 0.'4mg'$  every other day (or hold) during treatment *monitor blood pressure if you feel it is running low and can hold your valsartan if blood pressure is running low  -there is a chance of rebound illness with covid after improving. This can happen whether or not you take an antiviral treatment. If you become sick again with covid after getting better, please schedule a follow up virtual visit and isolate again.  -nasal saline sinus rinses twice daily  -stay hydrated, drink plenty of fluids and eat small healthy meals - avoid dairy  -follow up with your doctor in 2-3 days unless improving and feeling better  -stay home while sick, except to seek medical care. If you have COVID19, you will likely be contagious for 7-10 days. Flu or Influenza is likely contagious for about 7 days. Other respiratory viral infections remain  contagious for 5-10+ days depending on the virus and many other factors. Wear a good mask that fits snugly (such as N95 or KN95) if around others to reduce the risk of transmission.  It was nice to meet you today, and I really hope you are feeling better soon. I help [facility] out with telemedicine visits on Tuesdays and Thursdays and am happy to help if you need a follow up virtual visit on those days. Otherwise, if you have any concerns or questions following this visit please schedule a follow up visit with your Primary Care doctor or seek care at a local urgent care clinic to avoid delays in care.    Seek in person care or schedule a follow up video visit promptly if your symptoms worsen, new concerns arise or you are not improving with treatment. Call 911 and/or seek emergency care if your symptoms are severe or life threatening.   See the following link for the most recent information regarding Paxlovid:  [email or web address].com   Nirmatrelvir; Ritonavir Tablets What is this medication? NIRMATRELVIR; RITONAVIR (NIR ma TREL vir; ri TOE na veer) treats mild to moderate COVID-19. It may help people who are at high risk of developing severe illness. This medication works by limiting the spread of the virus in your body. The FDA has allowed the emergency use of this medication. This medicine may be used for other purposes; ask your health care provider or pharmacist if you have  questions. COMMON BRAND NAME(S): PAXLOVID What should I tell my care team before I take this medication? They need to know if you have any of these conditions: Any allergies Any serious illness Kidney disease Liver disease An unusual or allergic reaction to nirmatrelvir, ritonavir, other medications, foods, dyes, or preservatives Pregnant or trying to get pregnant Breast-feeding How should I use this medication? This product contains 2 different medications that are packaged together. For the standard dose, take 2 pink  tablets of nirmatrelvir with 1 white tablet of ritonavir (3 tablets total) by mouth with water twice daily. Talk to your care team if you have kidney disease. You may need a different dose. Swallow the tablets whole. You can take it with or without food. If it upsets your stomach, take it with food. Take all of this medication unless your care team tells you to stop it early. Keep taking it even if you think you are better. Talk to your care team about the use of this medication in children. While it may be prescribed for children as young as 12 years for selected conditions, precautions do apply. Overdosage: If you think you have taken too much of this medicine contact a poison control center or emergency room at once. NOTE: This medicine is only for you. Do not share this medicine with others. What if I miss a dose? If you miss a dose, take it as soon as you can unless it is more than 8 hours late. If it is more than 8 hours late, skip the missed dose. Take the next dose at the normal time. Do not take extra or 2 doses at the same time to make up for the missed dose. What may interact with this medication? Do not take this medication with any of the following medications: Alfuzosin Certain medications for anxiety or sleep like midazolam, triazolam Certain medications for cancer like apalutamide, enzalutamide Certain medications for cholesterol like lovastatin, simvastatin Certain medications for irregular heart beat like amiodarone, dronedarone, flecainide, propafenone, quinidine Certain medications for pain like meperidine, piroxicam Certain medications for psychotic disorders like clozapine, lurasidone, pimozide Certain medications for seizures like carbamazepine, phenobarbital, phenytoin Colchicine Eletriptan Eplerenone Ergot alkaloids like dihydroergotamine, ergonovine, ergotamine,  methylergonovine Finerenone Flibanserin Ivabradine Lomitapide Naloxegol Ranolazine Rifampin Sildenafil Silodosin [facility]. John's Wort Tolvaptan Ubrogepant Voclosporin This medication may also interact with the following medications: Bedaquiline Birth control pills Bosentan Certain antibiotics like erythromycin or clarithromycin Certain medications for blood pressure like amlodipine, diltiazem, felodipine, nicardipine, nifedipine Certain medications for cancer like abemaciclib, ceritinib, dasatinib, encorafenib, ibrutinib, ivosidenib, neratinib, nilotinib, venetoclax, vinblastine, vincristine Certain medications for cholesterol like atorvastatin, rosuvastatin Certain medications for depression like bupropion, trazodone Certain medications for fungal infections like isavuconazonium, itraconazole, ketoconazole, voriconazole Certain medications for hepatitis C like elbasvir; grazoprevir, dasabuvir; ombitasvir; paritaprevir; ritonavir, glecaprevir; pibrentasvir, sofosbuvir; velpatasvir; voxilaprevir Certain medications for HIV or AIDS Certain medications for irregular heartbeat like lidocaine Certain medications that treat or prevent blood clots like rivaroxaban, warfarin Digoxin Fentanyl Medications that lower your chance of fighting infection like cyclosporine, sirolimus, tacrolimus Methadone Quetiapine Rifabutin Salmeterol Steroid medications like betamethasone, budesonide, ciclesonide, dexamethasone, fluticasone, methylprednisone, mometasone, triamcinolone This list may not describe all possible interactions. Give your health care provider a list of all the medicines, herbs, non-prescription drugs, or dietary supplements you use. Also tell them if you smoke, drink alcohol, or use illegal drugs. Some items may interact with your medicine. What should I watch for while using this medication? Your condition will be monitored carefully while you  are receiving this medication. Visit your  care team for regular checkups. Tell your care team if your symptoms do not start to get better or if they get worse. If you have untreated HIV infection, this medication may lead to some HIV medications not working as well in the future. Birth control may not work properly while you are taking this medication. Talk to your care team about using an extra method of birth control. What side effects may I notice from receiving this medication? Side effects that you should report to your care team as soon as possible: Allergic reactions--skin rash, itching, hives, swelling of the face, lips, tongue, or throat Liver injury--right upper belly pain, loss of appetite, nausea, light-colored stool, dark yellow or brown urine, yellowing skin or eyes, unusual weakness or fatigue Redness, blistering, peeling, or loosening of the skin, including inside the mouth Side effects that usually do not require medical attention (report these to your care team if they continue or are bothersome): Change in taste Diarrhea General discomfort and fatigue Increase in blood pressure Muscle pain Nausea Stomach pain This list may not describe all possible side effects. Call your doctor for medical advice about side effects. You may report side effects to FDA at 1-800-FDA-1088. Where should I keep my medication? Keep out of the reach of children and pets. Store at room temperature between 20 and 25 degrees C (68 and 77 degrees F). Get rid of any unused medication after the expiration date. To get rid of medications that are no longer needed or have expired: Take the medication to a medication take-back program. Check with your pharmacy or law enforcement to find a location. If you cannot return the medication, check the label or package insert to see if the medication should be thrown out in the garbage or flushed down the toilet. If you are not sure, ask your care team. If it is safe to put it in the trash, take the  medication out of the container. Mix the medication with cat litter, dirt, coffee grounds, or other unwanted substance. Seal the mixture in a bag or container. Put it in the trash. NOTE: This sheet is a summary. It may not cover all possible information. If you have questions about this medicine, talk to your doctor, pharmacist, or health care provider.  2022 Elsevier/Gold Standard (2020-11-21 00:00:00)

## 2021-12-06 ENCOUNTER — Telehealth: Payer: Medicare PPO | Admitting: Family Medicine

## 2021-12-06 ENCOUNTER — Ambulatory Visit: Payer: Medicare PPO

## 2021-12-12 DIAGNOSIS — M5136 Other intervertebral disc degeneration, lumbar region: Secondary | ICD-10-CM | POA: Diagnosis not present

## 2021-12-12 DIAGNOSIS — M9903 Segmental and somatic dysfunction of lumbar region: Secondary | ICD-10-CM | POA: Diagnosis not present

## 2021-12-12 DIAGNOSIS — M9901 Segmental and somatic dysfunction of cervical region: Secondary | ICD-10-CM | POA: Diagnosis not present

## 2021-12-12 DIAGNOSIS — M5033 Other cervical disc degeneration, cervicothoracic region: Secondary | ICD-10-CM | POA: Diagnosis not present

## 2021-12-14 ENCOUNTER — Other Ambulatory Visit: Payer: Medicare PPO

## 2021-12-14 DIAGNOSIS — C61 Malignant neoplasm of prostate: Secondary | ICD-10-CM

## 2021-12-15 LAB — PSA: Prostate Specific Ag, Serum: <0.1 ng/mL (ref 0.0–4.0)

## 2021-12-18 ENCOUNTER — Ambulatory Visit (INDEPENDENT_AMBULATORY_CARE_PROVIDER_SITE_OTHER): Payer: Medicare PPO | Admitting: Family Medicine

## 2021-12-18 ENCOUNTER — Ambulatory Visit: Payer: Medicare PPO

## 2021-12-18 ENCOUNTER — Inpatient Hospital Stay: Payer: Medicare PPO | Attending: Radiation Oncology

## 2021-12-18 VITALS — BP 130/70 | HR 66 | Ht 68.0 in

## 2021-12-18 DIAGNOSIS — I10 Essential (primary) hypertension: Secondary | ICD-10-CM | POA: Diagnosis not present

## 2021-12-18 DIAGNOSIS — I499 Cardiac arrhythmia, unspecified: Secondary | ICD-10-CM | POA: Diagnosis not present

## 2021-12-18 DIAGNOSIS — C61 Malignant neoplasm of prostate: Secondary | ICD-10-CM | POA: Diagnosis not present

## 2021-12-18 DIAGNOSIS — Z0131 Encounter for examination of blood pressure with abnormal findings: Secondary | ICD-10-CM

## 2021-12-18 LAB — PSA: Prostatic Specific Antigen: <0.01 ng/mL (ref 0.00–4.00)

## 2021-12-18 NOTE — Patient Instructions (Signed)
Nice to see you. I believe your EKG indicates that you are just having extra heartbeats.  I am going to send your EKG to cardiology to confirm this.  I will contact you once I hear back from them.

## 2021-12-18 NOTE — Progress Notes (Signed)
Tommi Rumps, MD Phone: [phone]  [name] is a 81 y.o. male who presents today for f/u.  Irregular heartbeat: Patient presented for nurse blood pressure check.  Irregular heartbeat was noted on BP check by CMA.  I went in to evaluate the patient for this.  He notes no palpitations, chest pain, or shortness of breath.  No history of irregular heartbeat.  Social History   Tobacco Use  Smoking Status Never  Smokeless Tobacco Never    Current Outpatient Medications on File Prior to Visit  Medication Sig Dispense Refill   aspirin 81 MG tablet Take 81 mg by mouth daily.     azithromycin (ZITHROMAX) 250 MG tablet Take 2 tablets by mouth today, then 1 tablet daily for 4 additional days. 6 tablet 0   benzonatate (TESSALON PERLES) 100 MG capsule 1-2 capsules up to twice daily as needed for cough 20 capsule 0   metoprolol succinate (TOPROL-XL) 50 MG 24 hr tablet TAKE 1 TABLET BY MOUTH EVERY DAY 90 tablet 1   Misc Natural Products (TART CHERRY ADVANCED PO) Take 425 mg by mouth daily.     MITIGARE 0.6 MG CAPS TAKE 0.6 MG BY MOUTH DAILY AS NEEDED (GOUT FLARE). 90 capsule 1   Multiple Vitamins-Minerals (CVS SPECTRAVITE SENIOR) TABS Take 1 tablet by mouth daily.      pantoprazole (PROTONIX) 40 MG tablet TAKE 1 TABLET BY MOUTH TWICE A DAY 180 tablet 3   tamsulosin (FLOMAX) 0.4 MG CAPS capsule Take by mouth.     valsartan (DIOVAN) 160 MG tablet Take 1 tablet (160 mg total) by mouth daily. 90 tablet 3   Wheat Dextrin (BENEFIBER DRINK MIX PO) Take by mouth.     Current Facility-Administered Medications on File Prior to Visit  Medication Dose Route Frequency Provider Last Rate Last Admin   0.9 %  sodium chloride infusion  500 mL Intravenous Once Armbruster, Carlota Raspberry, MD         ROS see history of present illness  Objective  Physical Exam Vitals:   12/18/21 1450  BP: 130/70  Pulse: 66  SpO2: 97%    BP Readings from Last 3 Encounters:  12/18/21 130/70  11/20/21 (!) 140/80   10/24/21 136/78   Wt Readings from Last 3 Encounters:  12/05/21 267 lb (121.1 kg)  11/20/21 267 lb (121.1 kg)  10/24/21 257 lb (116.6 kg)    Physical Exam Constitutional:      General: He is not in acute distress.    Appearance: He is not diaphoretic.  Cardiovascular:     Rate and Rhythm: Normal rate. Rhythm irregular.  Neurological:     Mental Status: He is alert.      Assessment/Plan: Please see individual problem list.  Problem List Items Addressed This Visit     Essential hypertension (Chronic)    BP is well controlled. CMA noted the patients home cuff was too small. He will buy a bigger cuff.       Irregular heartbeat    EKG appears to be sinus rhythm with PVCs and PACs.  I am going to send this to his send the EKG to cardiology to review to confirm this.  Advised patient that I would let him know when we hear back.  Advised if it is PVCs and PACs we would not necessarily need to treat given that he is already on metoprolol unless he develops symptoms such as shortness of breath, chest pain, or palpitations.      Other Visit  Diagnoses     Encounter for examination of blood pressure with abnormal findings    -  Primary   Relevant Orders   EKG 12-Lead (Completed)       Return for as scheduled.   Tommi Rumps, MD [place]

## 2021-12-18 NOTE — Assessment & Plan Note (Signed)
BP is well controlled. CMA noted the patients home cuff was too small. He will buy a bigger cuff.

## 2021-12-18 NOTE — Assessment & Plan Note (Signed)
EKG appears to be sinus rhythm with PVCs and PACs.  I am going to send this to his send the EKG to cardiology to review to confirm this.  Advised patient that I would let him know when we hear back.  Advised if it is PVCs and PACs we would not necessarily need to treat given that he is already on metoprolol unless he develops symptoms such as shortness of breath, chest pain, or palpitations.

## 2021-12-19 ENCOUNTER — Ambulatory Visit: Payer: Medicare PPO | Admitting: Internal Medicine

## 2021-12-19 ENCOUNTER — Encounter: Payer: Self-pay | Admitting: Internal Medicine

## 2021-12-19 VITALS — BP 142/96 | HR 69 | Ht 68.0 in | Wt 263.0 lb

## 2021-12-19 DIAGNOSIS — K760 Fatty (change of) liver, not elsewhere classified: Secondary | ICD-10-CM | POA: Diagnosis not present

## 2021-12-19 DIAGNOSIS — K219 Gastro-esophageal reflux disease without esophagitis: Secondary | ICD-10-CM

## 2021-12-19 DIAGNOSIS — K5909 Other constipation: Secondary | ICD-10-CM | POA: Diagnosis not present

## 2021-12-19 NOTE — Patient Instructions (Signed)
_______________________________________________________  If you are age 82 or older, your body mass index should be between 23-30. Your Body mass index is 39.99 kg/m. If this is out of the aforementioned range listed, please consider follow up with your Primary Care Provider.  If you are age 28 or younger, your body mass index should be between 19-25. Your Body mass index is 39.99 kg/m. If this is out of the aformentioned range listed, please consider follow up with your Primary Care Provider.   ________________________________________________________  The [facility] providers would like to encourage you to use [facility] to communicate with providers for non-urgent requests or questions.  Due to long hold times on the telephone, sending your provider a message by [facility] may be a faster and more efficient way to get a response.  Please allow 48 business hours for a response.  Please remember that this is for non-urgent requests.  _______________________________________________________  Continue taking Miralax and follow up as needed

## 2021-12-19 NOTE — Progress Notes (Signed)
   Subjective:    Patient ID: [name], male    DOB: Feb 04, 1941, 81 y.o.   MRN: [id number]  HPI [name] is an 81 year old male with a history of constipation, diverticulosis, GERD, colon polyps, pancreatitis, fatty liver, prostate cancer, BPH and gout who is here for follow-up.  He is here today with his wife.  He was last seen on 06/08/2021 by Carl Best.  He reports since establishing MiraLAX initially twice daily and over the last several months once daily his bowel movements have been more regular.  He has had no further abdominal pain or constipation.  He is not using the Benefiber.  Bowel habits occur 2-3 times per day.  There can be some urgency for bowel movement but not affecting his activity.  From a reflux perspective pantoprazole 40 mg twice daily is working very well.  He reports this medicine as a "huge relief" and no further heartburn or dysphagia symptom.   Review of Systems As per HPI, otherwise negative  Current Medications, Allergies, Past Medical History, Past Surgical History, Family History and Social History were reviewed in [employer] record.    Objective:   Physical Exam BP (!) 142/96   Pulse 69   Ht '5\' 8"'$  (1.727 m)   Wt 263 lb (119.3 kg)   BMI 39.99 kg/m  Gen: awake, alert, NAD HEENT: anicteric CV: RRR, no mrg Abd: soft, NT/ND, +BS throughout Ext: no c/c/e Neuro: nonfocal      Assessment & Plan:  81 year old male with a history of constipation, diverticulosis, GERD, colon polyps, pancreatitis, fatty liver, prostate cancer, BPH and gout who is here for follow-up.  1.  Chronic constipation/colonic diverticulosis --much improvement now that he is established on MiraLAX therapy.  He did require bowel purge for near fecal impaction earlier this year.  Benefiber was stopped. -- Continue MiraLAX 17 g daily -- Okay to remain off Benefiber but if stool needs to be more bulky this could be added back  2.  GERD --stable  with pantoprazole 40 mg twice daily.  He did have some mild dysphagia symptom prior to starting this but this has resolved completely.  I discussed with him that he may be able to back down to pantoprazole once daily but if symptoms demand twice daily AC dosing is acceptable -- He will try to reduce pantoprazole to 40 mg once daily but can go back to twice daily AC if necessary to control symptoms  3.  Hepatic steatosis --normal liver enzymes with no evidence for advanced liver disease or cirrhosis  4.  History of colon polyps --small adenoma in January 2019, no further surveillance colonoscopies based on age.  Follow-up as needed

## 2021-12-20 ENCOUNTER — Encounter: Payer: Self-pay | Admitting: Urology

## 2021-12-20 ENCOUNTER — Ambulatory Visit: Payer: Medicare PPO | Admitting: Urology

## 2021-12-20 VITALS — BP 154/80 | HR 77 | Ht 68.0 in | Wt 263.0 lb

## 2021-12-20 DIAGNOSIS — R350 Frequency of micturition: Secondary | ICD-10-CM

## 2021-12-20 DIAGNOSIS — N401 Enlarged prostate with lower urinary tract symptoms: Secondary | ICD-10-CM | POA: Diagnosis not present

## 2021-12-20 DIAGNOSIS — N138 Other obstructive and reflux uropathy: Secondary | ICD-10-CM | POA: Diagnosis not present

## 2021-12-20 DIAGNOSIS — C61 Malignant neoplasm of prostate: Secondary | ICD-10-CM | POA: Diagnosis not present

## 2021-12-20 LAB — BLADDER SCAN AMB NON-IMAGING

## 2021-12-20 MED ORDER — LEUPROLIDE ACETATE (6 MONTH) 45 MG ~~LOC~~ KIT
45.0000 mg | PACK | Freq: Once | SUBCUTANEOUS | Status: AC
  Administered 2021-12-22: 45 mg via SUBCUTANEOUS

## 2021-12-20 NOTE — Progress Notes (Signed)
Eligard SubQ Injection   Due to Prostate Cancer patient is present today for a Eligard Injection.  Medication: Eligard 6 month Dose: 45 mg  Location: right  Lot: [id number] Exp: 03/2023  Patient tolerated well, no complications were noted  Performed by: S.Aveleen Nevers, CMA

## 2021-12-20 NOTE — Progress Notes (Signed)
12/20/2021 2:12 PM   [name] 1940-06-16 [id number]  Referring provider: Leone Haven, MD [address] [place],  [place] [address]  Chief Complaint  Patient presents with   Prostate Cancer    HPI: 81 year old male with personal history of high risk prostate cancer status post IMRT on ADT returns today for visit.  He is s/p fusion biopsy on 05/02/2021. Surgical pathology was consistent with Gleason 4+4 involving 3 of 12 cores affecting up to 80% primarily in the right apex, and Gleason 4+3 involving 1 of 12 cored affecting 5% primarily in the right mid.    He mentions today that he tolerated radiation well.  He is having some hot flashes and some fatigue but some of this he attributes to recent COVID-19 infection.  He has not been walking much but hopes to do this.  He is taking calcium and vitamin D supplementation.  He denies any significant urinary symptoms other than nocturia x2 as outlined below.  Is not bothered by this.   IPSS     Row Name 12/20/21 1000         International Prostate Symptom Score   How often have you had the sensation of not emptying your bladder? Less than 1 in 5     How often have you had to urinate less than every two hours? Less than 1 in 5 times     How often have you found you stopped and started again several times when you urinated? Less than half the time     How often have you found it difficult to postpone urination? Almost always     How often have you had a weak urinary stream? Less than 1 in 5 times     How often have you had to strain to start urination? Not at All     How many times did you typically get up at night to urinate? 2 Times     Total IPSS Score 12       Quality of Life due to urinary symptoms   If you were to spend the rest of your life with your urinary condition just the way it is now how would you feel about that? Mostly Satisfied              Score:  1-7 Mild 8-19 Moderate 20-35  Severe   PMH: Past Medical History:  Diagnosis Date   Allergy    Anxiety    Arthritis    Cataract    beginning stage bilateral   Diverticulosis    Elevated PSA    Esophageal stricture    Esophagitis    GERD (gastroesophageal reflux disease)    Gout    History of kidney stones    Hyperlipidemia    Hypertension    Inguinal hernia    Internal hemorrhoids    Pancreatitis    Prostate cancer ([place])    Tubular adenoma of colon     Surgical History: Past Surgical History:  Procedure Laterality Date   BACK SURGERY  1980's   L4/5   BALLOON DILATION N/A 11/17/2018   Procedure: URETHRAL BALLOON DILATION;  Surgeon: Hollice Espy, MD;  Location: [facility];  Service: Urology;  Laterality: N/A;   CARPAL TUNNEL RELEASE Right 08/15/2016   Procedure: CARPAL TUNNEL RELEASE ENDOSCOPIC;  Surgeon: Corky Mull, MD;  Location: [facility];  Service: Orthopedics;  Laterality: Right;   CATARACT EXTRACTION W/PHACO Right 03/14/2021   Procedure: CATARACT EXTRACTION  PHACO AND INTRAOCULAR LENS PLACEMENT ([facility]) RIGHT 6.84 00:52.3;  Surgeon: Birder Robson, MD;  Location: [facility];  Service: Ophthalmology;  Laterality: Right;   CATARACT EXTRACTION W/PHACO Left 03/28/2021   Procedure: CATARACT EXTRACTION PHACO AND INTRAOCULAR LENS PLACEMENT ([place]) LEFT;  Surgeon: Birder Robson, MD;  Location: [facility];  Service: Ophthalmology;  Laterality: Left;   COLONOSCOPY     CYSTOSCOPY N/A 11/17/2018   Procedure: CYSTOSCOPY;  Surgeon: Hollice Espy, MD;  Location: [facility];  Service: Urology;  Laterality: N/A;   CYSTOSCOPY WITH LITHOLAPAXY N/A 10/09/2017   Procedure: CYSTOSCOPY WITH LITHOLAPAXY;  Surgeon: Hollice Espy, MD;  Location: [facility];  Service: Urology;  Laterality: N/A;   ESOPHAGOGASTRODUODENOSCOPY  11-21-06   Esophagitis, Stricture/dilated    HOLEP-LASER ENUCLEATION OF THE PROSTATE WITH MORCELLATION N/A 10/09/2017   Procedure: HOLEP-LASER ENUCLEATION OF THE PROSTATE WITH  MORCELLATION;  Surgeon: Hollice Espy, MD;  Location: [facility];  Service: Urology;  Laterality: N/A;   LYMPH NODE BIOPSY  1957   benign  lymph node removed   Right arm fracture     TONSILLECTOMY     TOTAL KNEE ARTHROPLASTY  09-02-02   Bilateral   UPPER GASTROINTESTINAL ENDOSCOPY      Home Medications:  Allergies as of 12/20/2021       Reactions   Penicillins Rash   Did it involve swelling of the face/tongue/throat, SOB, or low BP? No Did it involve sudden or severe rash/hives, skin peeling, or any reaction on the inside of your mouth or nose? No Did you need to seek medical attention at a hospital or doctor's office? No When did it last happen?      50 Years If all above answers are "NO", may proceed with cephalosporin use.   Acetaminophen Itching   Aspirin Itching   OK w/low dose aspirin         Medication List        Accurate as of December 20, 2021  2:12 PM. If you have any questions, ask your nurse or doctor.          STOP taking these medications    azithromycin 250 MG tablet Commonly known as: ZITHROMAX Stopped by: Hollice Espy, MD   BENEFIBER DRINK MIX PO Stopped by: Hollice Espy, MD   benzonatate 100 MG capsule Commonly known as: Best boy Stopped by: Hollice Espy, MD   [facility] Tabs Stopped by: Hollice Espy, MD       TAKE these medications    acetaminophen 120 MG suppository Commonly known as: TYLENOL Place 120 mg rectally every 4 (four) hours as needed.   aspirin 81 MG tablet Take 81 mg by mouth daily.   fluticasone 50 MCG/ACT nasal spray Commonly known as: FLONASE Place into both nostrils daily.   metoprolol succinate 50 MG 24 hr tablet Commonly known as: TOPROL-XL TAKE 1 TABLET BY MOUTH EVERY DAY   Mitigare 0.6 MG Caps Generic drug: Colchicine TAKE 0.6 MG BY MOUTH DAILY AS NEEDED (GOUT FLARE).   NON FORMULARY Clindamycin 150 mg   pantoprazole 40 MG tablet Commonly known as: PROTONIX TAKE 1 TABLET  BY MOUTH TWICE A DAY   PERIDIN-C PO Take by mouth.   polyethylene glycol 17 g packet Commonly known as: MIRALAX / GLYCOLAX Take 17 g by mouth daily.   tamsulosin 0.4 MG Caps capsule Commonly known as: FLOMAX Take by mouth.   TART CHERRY ADVANCED PO Take 425 mg by mouth daily.   valsartan 160 MG tablet Commonly known  as: DIOVAN Take 1 tablet (160 mg total) by mouth daily.        Allergies:  Allergies  Allergen Reactions   Penicillins Rash    Did it involve swelling of the face/tongue/throat, SOB, or low BP? No Did it involve sudden or severe rash/hives, skin peeling, or any reaction on the inside of your mouth or nose? No Did you need to seek medical attention at a hospital or doctor's office? No When did it last happen?      50 Years If all above answers are "NO", may proceed with cephalosporin use.    Acetaminophen Itching   Aspirin Itching    OK w/low dose aspirin     Family History: Family History  Problem Relation Age of Onset   Ulcers Father    Diverticulitis Father    Arthritis Father    Hypertension Brother    Obesity Brother    Cancer Brother        throat   Arthritis Mother    Depression Mother        Anxiety   Colon cancer Neg Hx    Colon polyps Neg Hx    Esophageal cancer Neg Hx    Rectal cancer Neg Hx    Stomach cancer Neg Hx    Kidney disease Neg Hx    Kidney cancer Neg Hx    Prostate cancer Neg Hx     Social History:  reports that he has never smoked. He has never used smokeless tobacco. He reports that he does not drink alcohol and does not use drugs.   Physical Exam: BP (!) 154/80   Pulse 77   Ht '5\' 8"'$  (1.727 m)   Wt 263 lb (119.3 kg)   BMI 39.99 kg/m   Constitutional:  Alert and oriented, No acute distress. HEENT: [place] AT, moist mucus membranes.  Trachea midline, no masses. Cardiovascular: No clubbing, cyanosis, or edema. Neurologic: Grossly intact, no focal deficits, moving all 4 extremities. Psychiatric: Normal mood and  affect.  Laboratory Data: Lab Results  Component Value Date   WBC 8.7 09/01/2021   HGB 13.7 09/01/2021   HCT 39.8 09/01/2021   MCV 91.1 09/01/2021   PLT 189 09/01/2021    Lab Results  Component Value Date   CREATININE 1.24 11/20/2021   Results for orders placed or performed in visit on 12/20/21  Bladder Scan (Post Void Residual) in office  Result Value Ref Range   Scan Result 111ML      Assessment & Plan:    1. BPH with obstruction/lower urinary tract symptoms Doing symptomatically well on Flomax with reasonably well emptying, will continue to monitor this - Bladder Scan (Post Void Residual) in office   2. Prostate cancer ([place]) Status post IMRT now on ADT for 2 to 3 years for high risk prostate cancer  An additional dose of Eligard was given today for 79-monthDepo  PSA is undetectable  Reviewed bone health recommendations - Bladder Scan (Post Void Residual) in office - leuprolide (6 Month) (ELIGARD) injection 45 mg   Return in about 6 months (around 06/21/2022) for PSA/ Eligard.  AHollice Espy MD  [facility] [address] [place] [phone]

## 2021-12-22 DIAGNOSIS — N401 Enlarged prostate with lower urinary tract symptoms: Secondary | ICD-10-CM | POA: Diagnosis not present

## 2021-12-22 DIAGNOSIS — C61 Malignant neoplasm of prostate: Secondary | ICD-10-CM | POA: Diagnosis not present

## 2021-12-22 DIAGNOSIS — N138 Other obstructive and reflux uropathy: Secondary | ICD-10-CM | POA: Diagnosis not present

## 2021-12-25 ENCOUNTER — Ambulatory Visit
Admission: RE | Admit: 2021-12-25 | Discharge: 2021-12-25 | Disposition: A | Discharge status: Home / Self Care | Payer: Medicare PPO | Source: Ambulatory Visit | Attending: Radiation Oncology | Admitting: Radiation Oncology

## 2021-12-25 ENCOUNTER — Other Ambulatory Visit: Payer: Self-pay | Admitting: *Deleted

## 2021-12-25 VITALS — BP 156/80 | HR 69 | Temp 98.0°F | Resp 16 | Ht 68.0 in | Wt 264.6 lb

## 2021-12-25 DIAGNOSIS — C61 Malignant neoplasm of prostate: Secondary | ICD-10-CM | POA: Diagnosis not present

## 2021-12-25 DIAGNOSIS — Z923 Personal history of irradiation: Secondary | ICD-10-CM | POA: Insufficient documentation

## 2021-12-25 DIAGNOSIS — Z191 Hormone sensitive malignancy status: Secondary | ICD-10-CM | POA: Diagnosis not present

## 2021-12-25 NOTE — Progress Notes (Signed)
Radiation Oncology Follow up Note  Name: [name]   Date:   12/25/2021 MRN:  [id number] DOB: March 20, 1940    This 81 y.o. male presents to the clinic today for 7-monthfollow-up status post IMRT radiation therapy to his prostate and pelvic nodes for Gleason 8 (4+4) adenocarcinoma presenting with a PSA of 13.  REFERRING PROVIDER: SLeone Haven MD  HPI: Patient is an 81year old male now out for months having completed IMRT radiation to therapy to his prostate and pelvic nodes for Gleason 8 adenocarcinoma the prostate.  Seen today in routine follow-up he is doing well specifically denies any increase in his lower urinary tract symptoms diarrhea or fatigue..  Most recent PSA 7 days ago was less than 0.01.  He is currently on ADT therapy.  COMPLICATIONS OF TREATMENT: none  FOLLOW UP COMPLIANCE: keeps appointments   PHYSICAL EXAM:  BP (!) 156/80   Pulse 69   Temp 98 F (36.7 C)   Resp 16   Ht '5\' 8"'$  (1.727 m)   Wt 264 lb 9.6 oz (120 kg)   BMI 40.23 kg/m  Well-developed well-nourished patient in NAD. HEENT reveals PERLA, EOMI, discs not visualized.  Oral cavity is clear. No oral mucosal lesions are identified. Neck is clear without evidence of cervical or supraclavicular adenopathy. Lungs are clear to A&P. Cardiac examination is essentially unremarkable with regular rate and rhythm without murmur rub or thrill. Abdomen is benign with no organomegaly or masses noted. Motor sensory and DTR levels are equal and symmetric in the upper and lower extremities. Cranial nerves II through XII are grossly intact. Proprioception is intact. No peripheral adenopathy or edema is identified. No motor or sensory levels are noted. Crude visual fields are within normal range.  RADIOLOGY RESULTS: No current films for review  PLAN: Present time patient is doing well under excellent biochemical control of his prostate cancer on ADT therapy.  On pleased with his overall progress and low side effect  profile.  Of asked to see him back in 6 months for follow-up with a repeat PSA.  Patient knows to call with any concerns.  I would like to take this opportunity to thank you for allowing me to participate in the care of your patient..Noreene Filbert MD

## 2021-12-29 ENCOUNTER — Encounter: Payer: Self-pay | Admitting: Family Medicine

## 2021-12-29 DIAGNOSIS — I10 Essential (primary) hypertension: Secondary | ICD-10-CM

## 2021-12-29 MED ORDER — VALSARTAN 320 MG PO TABS: 320.0000 mg | ORAL_TABLET | Freq: Every day | ORAL | 1 refills | Status: DC

## 2021-12-29 NOTE — Telephone Encounter (Signed)
I called and LVM for patient to call back to be scheduled for a BP check.  [name],cma

## 2021-12-30 ENCOUNTER — Other Ambulatory Visit: Payer: Self-pay | Admitting: Physician Assistant

## 2022-01-09 DIAGNOSIS — M5136 Other intervertebral disc degeneration, lumbar region: Secondary | ICD-10-CM | POA: Diagnosis not present

## 2022-01-09 DIAGNOSIS — M5033 Other cervical disc degeneration, cervicothoracic region: Secondary | ICD-10-CM | POA: Diagnosis not present

## 2022-01-09 DIAGNOSIS — M9901 Segmental and somatic dysfunction of cervical region: Secondary | ICD-10-CM | POA: Diagnosis not present

## 2022-01-09 DIAGNOSIS — M9903 Segmental and somatic dysfunction of lumbar region: Secondary | ICD-10-CM | POA: Diagnosis not present

## 2022-01-12 ENCOUNTER — Ambulatory Visit (INDEPENDENT_AMBULATORY_CARE_PROVIDER_SITE_OTHER): Payer: Medicare PPO

## 2022-01-12 DIAGNOSIS — I10 Essential (primary) hypertension: Secondary | ICD-10-CM | POA: Diagnosis not present

## 2022-01-12 NOTE — Progress Notes (Signed)
Pt presented today for a blood pressure check. Blood pressure today in the left arm was 160/80 HR 65, right arm 150/74 HR 64. Pt is taking Metoprolol 50 mg in the morning and valsartan 320 mg at bedtime. Pt stated that ever since his last appointment with Dr. Caryl Bis his had a dull headache across his forehead. Pt stated that he has not taken anything for the headache.

## 2022-01-19 ENCOUNTER — Encounter: Payer: Self-pay | Admitting: Family Medicine

## 2022-01-19 NOTE — Telephone Encounter (Unsigned)
Patient needs to see me before thanksgiving for this. Please contact him to set this up.

## 2022-01-22 NOTE — Telephone Encounter (Signed)
Patient is scheduled for Thursday 02/01/22 at 10:30 for his BP

## 2022-01-24 DIAGNOSIS — N200 Calculus of kidney: Secondary | ICD-10-CM | POA: Diagnosis not present

## 2022-01-24 DIAGNOSIS — I10 Essential (primary) hypertension: Secondary | ICD-10-CM | POA: Diagnosis not present

## 2022-01-24 DIAGNOSIS — Z8546 Personal history of malignant neoplasm of prostate: Secondary | ICD-10-CM | POA: Diagnosis not present

## 2022-01-24 DIAGNOSIS — N400 Enlarged prostate without lower urinary tract symptoms: Secondary | ICD-10-CM | POA: Diagnosis not present

## 2022-01-24 DIAGNOSIS — M1000 Idiopathic gout, unspecified site: Secondary | ICD-10-CM | POA: Diagnosis not present

## 2022-01-24 DIAGNOSIS — N35919 Unspecified urethral stricture, male, unspecified site: Secondary | ICD-10-CM | POA: Diagnosis not present

## 2022-01-24 DIAGNOSIS — R3129 Other microscopic hematuria: Secondary | ICD-10-CM | POA: Diagnosis not present

## 2022-01-24 DIAGNOSIS — N1832 Chronic kidney disease, stage 3b: Secondary | ICD-10-CM | POA: Diagnosis not present

## 2022-01-24 DIAGNOSIS — R809 Proteinuria, unspecified: Secondary | ICD-10-CM | POA: Diagnosis not present

## 2022-02-01 ENCOUNTER — Encounter: Payer: Self-pay | Admitting: Family Medicine

## 2022-02-01 ENCOUNTER — Ambulatory Visit (INDEPENDENT_AMBULATORY_CARE_PROVIDER_SITE_OTHER): Payer: Medicare PPO | Admitting: Family Medicine

## 2022-02-01 ENCOUNTER — Telehealth: Payer: Self-pay | Admitting: Family Medicine

## 2022-02-01 VITALS — BP 136/82 | HR 70 | Temp 98.1°F | Wt 262.6 lb

## 2022-02-01 DIAGNOSIS — I10 Essential (primary) hypertension: Secondary | ICD-10-CM

## 2022-02-01 NOTE — Patient Instructions (Signed)
I will contact you once I hear back from our clinical pharmacist on whether or not we could retry HCTZ.

## 2022-02-01 NOTE — Progress Notes (Signed)
Tommi Rumps, MD Phone: [phone]  [name] is a 81 y.o. male who presents today for f/u.  HYPERTENSION Disease Monitoring Home BP Monitoring 140-145/<90 Chest pain- no    Dyspnea- no Medications Compliance-  taking metoprolol in the am and valsartan in the PM.   Edema- chronic and stable Headaches resolved with improved BP.   Was on benazepril/HCTZ in the past though this was stopped due to possibly causing pancreatitis. BMET    Component Value Date/Time   NA 140 11/20/2021 1517   NA 137 09/01/2013 0410   K 3.7 11/20/2021 1517   K 3.6 09/01/2013 0410   CL 105 11/20/2021 1517   CL 106 09/01/2013 0410   CO2 27 11/20/2021 1517   CO2 23 09/01/2013 0410   GLUCOSE 108 (H) 11/20/2021 1517   GLUCOSE 107 (H) 09/01/2013 0410   BUN 14 11/20/2021 1517   BUN 13 06/24/2017 1039   BUN 14 09/01/2013 0410   CREATININE 1.24 11/20/2021 1517   CREATININE 1.40 (H) 09/01/2013 0410   CALCIUM 8.8 11/20/2021 1517   CALCIUM 8.0 (L) 09/01/2013 0410   GFRNONAA 46 (L) 09/01/2021 1201   GFRNONAA 49 (L) 09/01/2013 0410   GFRAA 57 (L) 10/04/2017 1306   GFRAA 57 (L) 09/01/2013 0410     Social History   Tobacco Use  Smoking Status Never  Smokeless Tobacco Never    Current Outpatient Medications on File Prior to Visit  Medication Sig Dispense Refill   acetaminophen (TYLENOL) 120 MG suppository Place 120 mg rectally every 4 (four) hours as needed.     aspirin 81 MG tablet Take 81 mg by mouth daily.     fluticasone (FLONASE) 50 MCG/ACT nasal spray Place into both nostrils daily.     metoprolol succinate (TOPROL-XL) 50 MG 24 hr tablet TAKE 1 TABLET BY MOUTH EVERY DAY 90 tablet 1   Misc Natural Products (TART CHERRY ADVANCED PO) Take 425 mg by mouth daily.     MITIGARE 0.6 MG CAPS TAKE 0.6 MG BY MOUTH DAILY AS NEEDED (GOUT FLARE). 90 capsule 1   NON FORMULARY Clindamycin 150 mg     pantoprazole (PROTONIX) 40 MG tablet TAKE 1 TABLET BY MOUTH TWICE A DAY 180 tablet 3   polyethylene  glycol (MIRALAX / GLYCOLAX) 17 g packet Take 17 g by mouth daily.     tamsulosin (FLOMAX) 0.4 MG CAPS capsule TAKE 1 CAPSULE BY MOUTH EVERY DAY 90 capsule 3   valsartan (DIOVAN) 320 MG tablet Take 1 tablet (320 mg total) by mouth daily. 90 tablet 1   Bioflavonoid Products (PERIDIN-C PO) Take by mouth. (Patient not taking: Reported on 02/01/2022)     Current Facility-Administered Medications on File Prior to Visit  Medication Dose Route Frequency Provider Last Rate Last Admin   0.9 %  sodium chloride infusion  500 mL Intravenous Once Armbruster, Carlota Raspberry, MD         ROS see history of present illness  Objective  Physical Exam Vitals:   02/01/22 1104  BP: 136/82  Pulse: 70  Temp: 98.1 F (36.7 C)  SpO2: 98%    BP Readings from Last 3 Encounters:  02/01/22 136/82  01/12/22 (!) 150/74  12/25/21 (!) 156/80   Wt Readings from Last 3 Encounters:  02/01/22 262 lb 9.6 oz (119.1 kg)  12/25/21 264 lb 9.6 oz (120 kg)  12/20/21 263 lb (119.3 kg)    Physical Exam Constitutional:      General: [name] is not in acute distress.  Appearance: [name] is not diaphoretic.  Cardiovascular:     Rate and Rhythm: Normal rate and regular rhythm.     Heart sounds: Normal heart sounds.  Pulmonary:     Effort: Pulmonary effort is normal.     Breath sounds: Normal breath sounds.  Skin:    General: Skin is warm and dry.  Neurological:     Mental Status: [name] is alert.      Assessment/Plan: Please see individual problem list.  Problem List Items Addressed This Visit     Essential hypertension - Primary (Chronic)    Above goal.  Will get a check with our clinical pharmacist on what the likelihood is of HCTZ causing pancreatitis would be.  [name] will continue valsartan 320 mg daily and metoprolol 50 mg daily.  Once I hear back from the clinical pharmacist we will let him know.       Return in about 1 month (around 03/03/2022) for Hypertension.   Tommi Rumps, MD [place]

## 2022-02-01 NOTE — Telephone Encounter (Signed)
-----   Message from Osker Mason, [place] sent at 02/01/2022 11:11 AM EST ----- Looks like for both HCTZ and lisinopril, the risk of pancreatitis was relatively low - 2-3 cases in literature. I think the risk of negative outcomes from uncontrolled BP is higher than the risk of recurrent pancreatitis, and I think it would be fine if you started and counseled on monitoring.   Other thoughts - I noticed the diagnosis of gout, but didn't see any evidence of recent flares that would concern me about starting a diuretic. I see he did have LEE on amlodipine, but it was amlodipine 10 mg. How's the swelling today? Could consider amlodipine 5 and see how he does before the HCTZ, just to be safe?  Catie   ----- Message ----- From: Leone Haven, MD Sent: 02/01/2022  11:08 AM EST To: Osker Mason, RPH-CPP  Hey,   I am working on getting this patient's blood pressure under better control.  In the distant past he was on an ACE inhibitor and HCTZ in combination.  This is discontinued during a hospitalization for pancreatitis as I do not think they were able to find anything else that could have precipitated this.  Looking at up-to-date it looks like pancreatitis is listed as a postmarketing adverse effect with both of those medications.  Do know how significant that potential for pancreatitis is?  I would like to retry HCTZ though I want to make sure that it is unlikely to cause pancreatitis again.  Thanks for your help.

## 2022-02-01 NOTE — Assessment & Plan Note (Signed)
Above goal.  Will get a check with our clinical pharmacist on what the likelihood is of HCTZ causing pancreatitis would be.  He will continue valsartan 320 mg daily and metoprolol 50 mg daily.  Once I hear back from the clinical pharmacist we will let him know.

## 2022-02-01 NOTE — Telephone Encounter (Signed)
Please let the patient know that I heard back from our clinical pharmacist.  Based on our discussion it may be best to retry a low-dose of the amlodipine 5 mg daily to see if he can tolerate that before we try something like HCTZ.  The HCTZ could potentially precipitate a gout flare and I would like to only use that if the amlodipine at a low dose does not help with his blood pressure.  If we try the amlodipine and he has any worsening swelling he can let me know and we can try something different.

## 2022-02-02 MED ORDER — AMLODIPINE BESYLATE 5 MG PO TABS: 5.0000 mg | ORAL_TABLET | Freq: Every day | ORAL | 3 refills | Status: DC

## 2022-02-02 NOTE — Telephone Encounter (Signed)
Amlodipine 5 mg sent to pharmacy.

## 2022-02-02 NOTE — Telephone Encounter (Signed)
Called and spoke with pt.  He is willing to try the low dose amlodipine and will pick it up from the pharmacy once its called in.  He is aware of call the office if he experiences any increased swelling.

## 2022-02-02 NOTE — Addendum Note (Signed)
Addended by: Caryl Bis, Lashea Goda G on: 02/02/2022 02:07 PM   Modules accepted: Orders

## 2022-02-06 DIAGNOSIS — M9903 Segmental and somatic dysfunction of lumbar region: Secondary | ICD-10-CM | POA: Diagnosis not present

## 2022-02-06 DIAGNOSIS — M5033 Other cervical disc degeneration, cervicothoracic region: Secondary | ICD-10-CM | POA: Diagnosis not present

## 2022-02-06 DIAGNOSIS — M5136 Other intervertebral disc degeneration, lumbar region: Secondary | ICD-10-CM | POA: Diagnosis not present

## 2022-02-06 DIAGNOSIS — M9901 Segmental and somatic dysfunction of cervical region: Secondary | ICD-10-CM | POA: Diagnosis not present

## 2022-02-08 NOTE — Progress Notes (Signed)
Late entry: See phone note from 11/30. Medication has been adjusted & sent in

## 2022-02-14 ENCOUNTER — Ambulatory Visit: Payer: Medicare PPO | Admitting: Family Medicine

## 2022-03-06 DIAGNOSIS — M9903 Segmental and somatic dysfunction of lumbar region: Secondary | ICD-10-CM | POA: Diagnosis not present

## 2022-03-06 DIAGNOSIS — M9901 Segmental and somatic dysfunction of cervical region: Secondary | ICD-10-CM | POA: Diagnosis not present

## 2022-03-06 DIAGNOSIS — M5033 Other cervical disc degeneration, cervicothoracic region: Secondary | ICD-10-CM | POA: Diagnosis not present

## 2022-03-06 DIAGNOSIS — M5136 Other intervertebral disc degeneration, lumbar region: Secondary | ICD-10-CM | POA: Diagnosis not present

## 2022-03-16 ENCOUNTER — Encounter: Payer: Self-pay | Admitting: Family Medicine

## 2022-03-16 ENCOUNTER — Ambulatory Visit: Payer: Medicare PPO | Admitting: Family Medicine

## 2022-03-16 VITALS — BP 130/60 | HR 71 | Temp 98.3°F | Ht 68.0 in | Wt 261.0 lb

## 2022-03-16 DIAGNOSIS — I10 Essential (primary) hypertension: Secondary | ICD-10-CM

## 2022-03-16 DIAGNOSIS — E6609 Other obesity due to excess calories: Secondary | ICD-10-CM | POA: Diagnosis not present

## 2022-03-16 DIAGNOSIS — Z6839 Body mass index (BMI) 39.0-39.9, adult: Secondary | ICD-10-CM

## 2022-03-16 LAB — LIPID PANEL
Cholesterol: 177 mg/dL (ref 0–200)
HDL: 36.2 mg/dL — ABNORMAL LOW (ref >39.00)
LDL Cholesterol: 109 mg/dL — ABNORMAL HIGH (ref 0–99)
NonHDL: 140.75
Total CHOL/HDL Ratio: 5
Triglycerides: 160 mg/dL — ABNORMAL HIGH (ref 0.0–149.0)
VLDL: 32 mg/dL (ref 0.0–40.0)

## 2022-03-16 LAB — COMPREHENSIVE METABOLIC PANEL
ALT: 29 U/L (ref 0–53)
AST: 26 U/L (ref 0–37)
Albumin: 4.1 g/dL (ref 3.5–5.2)
Alkaline Phosphatase: 75 U/L (ref 39–117)
BUN: 19 mg/dL (ref 6–23)
CO2: 27 mEq/L (ref 19–32)
Calcium: 9.1 mg/dL (ref 8.4–10.5)
Chloride: 105 mEq/L (ref 96–112)
Creatinine, Ser: 1.35 mg/dL (ref 0.40–1.50)
GFR: 48.91 mL/min — ABNORMAL LOW (ref >60.00)
Glucose, Bld: 107 mg/dL — ABNORMAL HIGH (ref 70–99)
Potassium: 4 mEq/L (ref 3.5–5.1)
Sodium: 141 mEq/L (ref 135–145)
Total Bilirubin: 0.5 mg/dL (ref 0.2–1.2)
Total Protein: 7 g/dL (ref 6.0–8.3)

## 2022-03-16 NOTE — Assessment & Plan Note (Signed)
Chronic issue.  Above goal at home.  I am going to confer with our clinical pharmacist to see about the equivalent dosing of carvedilol for his current dose of metoprolol.  We will then switch him over to carvedilol to see if provides better blood pressure management.  He will continue amlodipine 5 mg daily and valsartan 320 mg daily.  HCTZ is not an option given his history of gout.  He has swelling so we cannot increase his amlodipine.

## 2022-03-16 NOTE — Patient Instructions (Addendum)
Nice to see you.  I am going to communicate with our pharmacist to see how we switching you from metoprolol to coreg. I think this will treat your BP better than the metoprolol. I will let you know when I hear back from her.

## 2022-03-16 NOTE — Progress Notes (Signed)
Sanchez Rumps, MD Phone: [phone]  Sanchez Sanchez is a 82 y.o. male who presents today for f/u.  HYPERTENSION Disease Monitoring Home BP Monitoring 140s-150s/70s-80s Chest pain- no    Dyspnea- no Medications Compliance-  taking amlodipine, metoprolol, valsartan.  Edema- yes, chronic and stable BMET    Component Value Date/Time   NA 140 11/20/2021 1517   NA 137 09/01/2013 0410   K 3.7 11/20/2021 1517   K 3.6 09/01/2013 0410   CL 105 11/20/2021 1517   CL 106 09/01/2013 0410   CO2 27 11/20/2021 1517   CO2 23 09/01/2013 0410   GLUCOSE 108 (H) 11/20/2021 1517   GLUCOSE 107 (H) 09/01/2013 0410   BUN 14 11/20/2021 1517   BUN 13 06/24/2017 1039   BUN 14 09/01/2013 0410   CREATININE 1.24 11/20/2021 1517   CREATININE 1.40 (H) 09/01/2013 0410   CALCIUM 8.8 11/20/2021 1517   CALCIUM 8.0 (L) 09/01/2013 0410   GFRNONAA 46 (L) 09/01/2021 1201   GFRNONAA 49 (L) 09/01/2013 0410   GFRAA 57 (L) 10/04/2017 1306   GFRAA 57 (L) 09/01/2013 0410   Obesity: Patient notes he started to exercise by going up and down the stairs and doing some stretching.  When it warms up he is going to start walking.  He notes his diet consists of mostly chicken.  He does not add much salt.  They do eat out once a day.  He does have some fatigue if he walks for too long without any support.  Social History   Tobacco Use  Smoking Status Never  Smokeless Tobacco Never    Current Outpatient Medications on File Prior to Visit  Medication Sig Dispense Refill   acetaminophen (TYLENOL) 120 MG suppository Place 120 mg rectally every 4 (four) hours as needed.     amLODipine (NORVASC) 5 MG tablet Take 1 tablet (5 mg total) by mouth daily. 90 tablet 3   aspirin 81 MG tablet Take 81 mg by mouth daily.     Bioflavonoid Products (PERIDIN-C PO) Take by mouth. (Patient not taking: Reported on 02/01/2022)     fluticasone (FLONASE) 50 MCG/ACT nasal spray Place into both nostrils daily.     metoprolol succinate  (TOPROL-XL) 50 MG 24 hr tablet TAKE 1 TABLET BY MOUTH EVERY DAY 90 tablet 1   Misc Natural Products (TART CHERRY ADVANCED PO) Take 425 mg by mouth daily.     MITIGARE 0.6 MG CAPS TAKE 0.6 MG BY MOUTH DAILY AS NEEDED (GOUT FLARE). 90 capsule 1   NON FORMULARY Clindamycin 150 mg     pantoprazole (PROTONIX) 40 MG tablet TAKE 1 TABLET BY MOUTH TWICE A DAY 180 tablet 3   polyethylene glycol (MIRALAX / GLYCOLAX) 17 g packet Take 17 g by mouth daily.     tamsulosin (FLOMAX) 0.4 MG CAPS capsule TAKE 1 CAPSULE BY MOUTH EVERY DAY 90 capsule 3   valsartan (DIOVAN) 320 MG tablet Take 1 tablet (320 mg total) by mouth daily. 90 tablet 1   Current Facility-Administered Medications on File Prior to Visit  Medication Dose Route Frequency Provider Last Rate Last Admin   0.9 %  sodium chloride infusion  500 mL Intravenous Once Armbruster, Carlota Raspberry, MD         ROS see history of present illness  Objective  Physical Exam Vitals:   03/16/22 1036 03/16/22 1051  BP: (!) 170/90 130/60  Pulse: 71   Temp: 98.3 F (36.8 C)   SpO2: 98%     BP  Readings from Last 3 Encounters:  03/16/22 130/60  02/01/22 136/82  01/12/22 (!) 150/74   Wt Readings from Last 3 Encounters:  03/16/22 261 lb (118.4 kg)  02/01/22 262 lb 9.6 oz (119.1 kg)  12/25/21 264 lb 9.6 oz (120 kg)    Physical Exam Constitutional:      General: He is not in acute distress.    Appearance: He is not diaphoretic.  Cardiovascular:     Rate and Rhythm: Normal rate and regular rhythm.     Heart sounds: Normal heart sounds.  Pulmonary:     Effort: Pulmonary effort is normal.     Breath sounds: Normal breath sounds.  Skin:    General: Skin is warm and dry.  Neurological:     Mental Status: He is alert.      Assessment/Plan: Please see individual problem list.  Primary hypertension -     Lipid panel -     Comprehensive metabolic panel  Essential hypertension Assessment & Plan: Chronic issue.  Above goal at home.  I am going to  confer with our clinical pharmacist to see about the equivalent dosing of carvedilol for his current dose of metoprolol.  We will then switch him over to carvedilol to see if provides better blood pressure management.  He will continue amlodipine 5 mg daily and valsartan 320 mg daily.  HCTZ is not an option given his history of gout.  He has swelling so we cannot increase his amlodipine.   Class 2 obesity due to excess calories without serious comorbidity with body mass index (BMI) of 39.0 to 39.9 in adult Assessment & Plan: Chronic issue.  I encouraged increasing exercise and limiting salt intake.  Discussed eating out less.     Return in about 1 month (around 04/16/2022) for htn.   Sanchez Rumps, MD [place]

## 2022-03-16 NOTE — Assessment & Plan Note (Signed)
Chronic issue.  I encouraged increasing exercise and limiting salt intake.  Discussed eating out less.

## 2022-04-01 ENCOUNTER — Other Ambulatory Visit: Payer: Self-pay | Admitting: Family Medicine

## 2022-04-10 DIAGNOSIS — M9901 Segmental and somatic dysfunction of cervical region: Secondary | ICD-10-CM | POA: Diagnosis not present

## 2022-04-10 DIAGNOSIS — M9903 Segmental and somatic dysfunction of lumbar region: Secondary | ICD-10-CM | POA: Diagnosis not present

## 2022-04-10 DIAGNOSIS — M5033 Other cervical disc degeneration, cervicothoracic region: Secondary | ICD-10-CM | POA: Diagnosis not present

## 2022-04-10 DIAGNOSIS — M5136 Other intervertebral disc degeneration, lumbar region: Secondary | ICD-10-CM | POA: Diagnosis not present

## 2022-04-16 ENCOUNTER — Ambulatory Visit: Payer: Medicare PPO | Admitting: Family Medicine

## 2022-04-16 VITALS — BP 134/68 | HR 65 | Temp 98.2°F | Ht 68.0 in | Wt 264.6 lb

## 2022-04-16 DIAGNOSIS — J309 Allergic rhinitis, unspecified: Secondary | ICD-10-CM | POA: Diagnosis not present

## 2022-04-16 DIAGNOSIS — I10 Essential (primary) hypertension: Secondary | ICD-10-CM | POA: Diagnosis not present

## 2022-04-16 NOTE — Assessment & Plan Note (Signed)
Chronic issue.  Occurs intermittently.  Responds to over-the-counter antihistamines.  He will monitor for any worsening symptoms or recurrence.

## 2022-04-16 NOTE — Patient Instructions (Signed)
Nice to see you. I am going to check with our clinical pharmacist to see what dose of carvedilol to switch you to.  We will contact you once I hear back from her.

## 2022-04-16 NOTE — Assessment & Plan Note (Signed)
Chronic issue.  I am going to switch him from metoprolol to carvedilol.  I am going to check the clinical assist on appropriate dosing given his current dose of metoprolol.  He will continue amlodipine 5 mg daily and valsartan 320 mg daily.

## 2022-04-16 NOTE — Progress Notes (Signed)
Tommi Rumps, MD Phone: [phone]  [name] is a 82 y.o. male who presents today for f/u.  HYPERTENSION Disease Monitoring Home BP Monitoring 120-149/70-82 Chest pain- no    Dyspnea- no Medications Compliance-  taking amlodipine, metoprolol, valsartan.  Edema- no BMET    Component Value Date/Time   NA 141 03/16/2022 1042   NA 137 09/01/2013 0410   K 4.0 03/16/2022 1042   K 3.6 09/01/2013 0410   CL 105 03/16/2022 1042   CL 106 09/01/2013 0410   CO2 27 03/16/2022 1042   CO2 23 09/01/2013 0410   GLUCOSE 107 (H) 03/16/2022 1042   GLUCOSE 107 (H) 09/01/2013 0410   BUN 19 03/16/2022 1042   BUN 13 06/24/2017 1039   BUN 14 09/01/2013 0410   CREATININE 1.35 03/16/2022 1042   CREATININE 1.40 (H) 09/01/2013 0410   CALCIUM 9.1 03/16/2022 1042   CALCIUM 8.0 (L) 09/01/2013 0410   GFRNONAA 46 (L) 09/01/2021 1201   GFRNONAA 49 (L) 09/01/2013 0410   GFRAA 57 (L) 10/04/2017 1306   GFRAA 57 (L) 09/01/2013 0410   Allergic rhinitis: Patient noted some congestion 3 to 4 days ago that was mild in his nose mostly at night.  He has had no sneezing, fevers, or shortness of breath.  He took some NyQuil for a day or 2 and his symptoms have improved.  Social History   Tobacco Use  Smoking Status Never  Smokeless Tobacco Never    Current Outpatient Medications on File Prior to Visit  Medication Sig Dispense Refill   acetaminophen (TYLENOL) 120 MG suppository Place 120 mg rectally every 4 (four) hours as needed.     amLODipine (NORVASC) 5 MG tablet Take 1 tablet (5 mg total) by mouth daily. 90 tablet 3   aspirin 81 MG tablet Take 81 mg by mouth daily.     Bioflavonoid Products (PERIDIN-C PO) Take by mouth.     fluticasone (FLONASE) 50 MCG/ACT nasal spray Place into both nostrils daily.     metoprolol succinate (TOPROL-XL) 50 MG 24 hr tablet TAKE 1 TABLET BY MOUTH EVERY DAY 90 tablet 1   Misc Natural Products (TART CHERRY ADVANCED PO) Take 425 mg by mouth daily.     MITIGARE 0.6 MG  CAPS TAKE 0.6 MG BY MOUTH DAILY AS NEEDED (GOUT FLARE). 90 capsule 1   NON FORMULARY Clindamycin 150 mg     pantoprazole (PROTONIX) 40 MG tablet TAKE 1 TABLET BY MOUTH TWICE A DAY 180 tablet 3   polyethylene glycol (MIRALAX / GLYCOLAX) 17 g packet Take 17 g by mouth daily.     tamsulosin (FLOMAX) 0.4 MG CAPS capsule TAKE 1 CAPSULE BY MOUTH EVERY DAY 90 capsule 3   valsartan (DIOVAN) 320 MG tablet Take 1 tablet (320 mg total) by mouth daily. 90 tablet 1   Current Facility-Administered Medications on File Prior to Visit  Medication Dose Route Frequency Provider Last Rate Last Admin   0.9 %  sodium chloride infusion  500 mL Intravenous Once Armbruster, Carlota Raspberry, MD         ROS see history of present illness  Objective  Physical Exam Vitals:   04/16/22 0925 04/16/22 0955  BP: 132/70 134/68  Pulse: 65   Temp: 98.2 F (36.8 C)   SpO2: 97%     BP Readings from Last 3 Encounters:  04/16/22 134/68  03/16/22 130/60  02/01/22 136/82   Wt Readings from Last 3 Encounters:  04/16/22 264 lb 9.6 oz (120 kg)  03/16/22  261 lb (118.4 kg)  02/01/22 262 lb 9.6 oz (119.1 kg)    Physical Exam Constitutional:      General: He is not in acute distress.    Appearance: He is not diaphoretic.  Cardiovascular:     Rate and Rhythm: Normal rate and regular rhythm.     Heart sounds: Normal heart sounds.  Pulmonary:     Effort: Pulmonary effort is normal.     Breath sounds: Normal breath sounds.  Musculoskeletal:     Comments: 1+ pitting edema bilateral lower extremities to the mid shin  Skin:    General: Skin is warm and dry.  Neurological:     Mental Status: He is alert.      Assessment/Plan: Please see individual problem list.  Primary hypertension  Essential hypertension Assessment & Plan: Chronic issue.  I am going to switch him from metoprolol to carvedilol.  I am going to check the clinical assist on appropriate dosing given his current dose of metoprolol.  He will continue  amlodipine 5 mg daily and valsartan 320 mg daily.   Allergic rhinitis, unspecified seasonality, unspecified trigger Assessment & Plan: Chronic issue.  Occurs intermittently.  Responds to over-the-counter antihistamines.  He will monitor for any worsening symptoms or recurrence.     Return in about 3 months (around 07/15/2022) for Hypertension.   Tommi Rumps, MD [place]

## 2022-04-17 ENCOUNTER — Telehealth: Payer: Self-pay | Admitting: Family Medicine

## 2022-04-17 MED ORDER — CARVEDILOL 12.5 MG PO TABS: 12.5000 mg | ORAL_TABLET | Freq: Two times a day (BID) | ORAL | 3 refills | Status: DC

## 2022-04-17 NOTE — Telephone Encounter (Signed)
LVM for patient to call back.   For patient to call back. Remiel Corti,cma

## 2022-04-17 NOTE — Telephone Encounter (Signed)
-----   Message from Osker Mason, [place] sent at 04/16/2022  3:24 PM EST ----- Hi,   I'd go with carvedilol 12.5 mg twice daily.   Thanks!  Catie ----- Message ----- From: Leone Haven, MD Sent: 04/16/2022   9:47 AM EST To: Osker Mason, RPH-CPP  Hey Catie, this patient is on metoprolol XR 50 mg daily. I would like to switch him over to coreg to see if that would help more with his BP. What is the equivalent dose of coreg for his current dose of metoprolol? Thanks.

## 2022-04-17 NOTE — Telephone Encounter (Signed)
Please let the patient know that I am going to switch him from his metoprolol to carvedilol.  His carvedilol dose will be 12.5 mg twice daily.  He can start this 24 hours after he has taken his last dose of metoprolol.  Please also see how often he takes the [place].  Thanks.

## 2022-04-18 NOTE — Telephone Encounter (Signed)
Called patient reviewed all information and repeated back to me. Will call if any questions.  He only takes [place] when he needs its. He has not had a flare up in 3 or 4 months. He said he is trying to limit the amount of beef that he eats.

## 2022-04-18 NOTE — Telephone Encounter (Signed)
Noted  

## 2022-05-08 DIAGNOSIS — M9903 Segmental and somatic dysfunction of lumbar region: Secondary | ICD-10-CM | POA: Diagnosis not present

## 2022-05-08 DIAGNOSIS — M5136 Other intervertebral disc degeneration, lumbar region: Secondary | ICD-10-CM | POA: Diagnosis not present

## 2022-05-08 DIAGNOSIS — M5033 Other cervical disc degeneration, cervicothoracic region: Secondary | ICD-10-CM | POA: Diagnosis not present

## 2022-05-08 DIAGNOSIS — M9901 Segmental and somatic dysfunction of cervical region: Secondary | ICD-10-CM | POA: Diagnosis not present

## 2022-05-15 DIAGNOSIS — R809 Proteinuria, unspecified: Secondary | ICD-10-CM | POA: Diagnosis not present

## 2022-05-15 DIAGNOSIS — R3129 Other microscopic hematuria: Secondary | ICD-10-CM | POA: Diagnosis not present

## 2022-05-15 DIAGNOSIS — N1832 Chronic kidney disease, stage 3b: Secondary | ICD-10-CM | POA: Diagnosis not present

## 2022-05-15 DIAGNOSIS — N35919 Unspecified urethral stricture, male, unspecified site: Secondary | ICD-10-CM | POA: Diagnosis not present

## 2022-05-15 DIAGNOSIS — I10 Essential (primary) hypertension: Secondary | ICD-10-CM | POA: Diagnosis not present

## 2022-05-15 DIAGNOSIS — N200 Calculus of kidney: Secondary | ICD-10-CM | POA: Diagnosis not present

## 2022-05-15 DIAGNOSIS — M1000 Idiopathic gout, unspecified site: Secondary | ICD-10-CM | POA: Diagnosis not present

## 2022-05-15 DIAGNOSIS — Z8546 Personal history of malignant neoplasm of prostate: Secondary | ICD-10-CM | POA: Diagnosis not present

## 2022-05-15 DIAGNOSIS — N281 Cyst of kidney, acquired: Secondary | ICD-10-CM | POA: Diagnosis not present

## 2022-05-17 DIAGNOSIS — M9901 Segmental and somatic dysfunction of cervical region: Secondary | ICD-10-CM | POA: Diagnosis not present

## 2022-05-17 DIAGNOSIS — M5136 Other intervertebral disc degeneration, lumbar region: Secondary | ICD-10-CM | POA: Diagnosis not present

## 2022-05-17 DIAGNOSIS — M9903 Segmental and somatic dysfunction of lumbar region: Secondary | ICD-10-CM | POA: Diagnosis not present

## 2022-05-17 DIAGNOSIS — M5033 Other cervical disc degeneration, cervicothoracic region: Secondary | ICD-10-CM | POA: Diagnosis not present

## 2022-05-18 DIAGNOSIS — M9901 Segmental and somatic dysfunction of cervical region: Secondary | ICD-10-CM | POA: Diagnosis not present

## 2022-05-18 DIAGNOSIS — M9903 Segmental and somatic dysfunction of lumbar region: Secondary | ICD-10-CM | POA: Diagnosis not present

## 2022-05-18 DIAGNOSIS — M5136 Other intervertebral disc degeneration, lumbar region: Secondary | ICD-10-CM | POA: Diagnosis not present

## 2022-05-18 DIAGNOSIS — M5033 Other cervical disc degeneration, cervicothoracic region: Secondary | ICD-10-CM | POA: Diagnosis not present

## 2022-05-21 DIAGNOSIS — Z791 Long term (current) use of non-steroidal anti-inflammatories (NSAID): Secondary | ICD-10-CM | POA: Diagnosis not present

## 2022-05-21 DIAGNOSIS — M9901 Segmental and somatic dysfunction of cervical region: Secondary | ICD-10-CM | POA: Diagnosis not present

## 2022-05-21 DIAGNOSIS — R809 Proteinuria, unspecified: Secondary | ICD-10-CM | POA: Diagnosis not present

## 2022-05-21 DIAGNOSIS — R3129 Other microscopic hematuria: Secondary | ICD-10-CM | POA: Diagnosis not present

## 2022-05-21 DIAGNOSIS — N281 Cyst of kidney, acquired: Secondary | ICD-10-CM | POA: Diagnosis not present

## 2022-05-21 DIAGNOSIS — M5136 Other intervertebral disc degeneration, lumbar region: Secondary | ICD-10-CM | POA: Diagnosis not present

## 2022-05-21 DIAGNOSIS — I129 Hypertensive chronic kidney disease with stage 1 through stage 4 chronic kidney disease, or unspecified chronic kidney disease: Secondary | ICD-10-CM | POA: Diagnosis not present

## 2022-05-21 DIAGNOSIS — M9903 Segmental and somatic dysfunction of lumbar region: Secondary | ICD-10-CM | POA: Diagnosis not present

## 2022-05-21 DIAGNOSIS — M5033 Other cervical disc degeneration, cervicothoracic region: Secondary | ICD-10-CM | POA: Diagnosis not present

## 2022-05-21 DIAGNOSIS — N2581 Secondary hyperparathyroidism of renal origin: Secondary | ICD-10-CM | POA: Diagnosis not present

## 2022-05-24 DIAGNOSIS — M9903 Segmental and somatic dysfunction of lumbar region: Secondary | ICD-10-CM | POA: Diagnosis not present

## 2022-05-24 DIAGNOSIS — M5033 Other cervical disc degeneration, cervicothoracic region: Secondary | ICD-10-CM | POA: Diagnosis not present

## 2022-05-24 DIAGNOSIS — M5136 Other intervertebral disc degeneration, lumbar region: Secondary | ICD-10-CM | POA: Diagnosis not present

## 2022-05-24 DIAGNOSIS — M9901 Segmental and somatic dysfunction of cervical region: Secondary | ICD-10-CM | POA: Diagnosis not present

## 2022-05-29 DIAGNOSIS — M9903 Segmental and somatic dysfunction of lumbar region: Secondary | ICD-10-CM | POA: Diagnosis not present

## 2022-05-29 DIAGNOSIS — M9901 Segmental and somatic dysfunction of cervical region: Secondary | ICD-10-CM | POA: Diagnosis not present

## 2022-05-29 DIAGNOSIS — M5033 Other cervical disc degeneration, cervicothoracic region: Secondary | ICD-10-CM | POA: Diagnosis not present

## 2022-05-29 DIAGNOSIS — M5136 Other intervertebral disc degeneration, lumbar region: Secondary | ICD-10-CM | POA: Diagnosis not present

## 2022-05-31 DIAGNOSIS — M9903 Segmental and somatic dysfunction of lumbar region: Secondary | ICD-10-CM | POA: Diagnosis not present

## 2022-05-31 DIAGNOSIS — M9901 Segmental and somatic dysfunction of cervical region: Secondary | ICD-10-CM | POA: Diagnosis not present

## 2022-05-31 DIAGNOSIS — M5033 Other cervical disc degeneration, cervicothoracic region: Secondary | ICD-10-CM | POA: Diagnosis not present

## 2022-05-31 DIAGNOSIS — M5136 Other intervertebral disc degeneration, lumbar region: Secondary | ICD-10-CM | POA: Diagnosis not present

## 2022-06-04 ENCOUNTER — Encounter: Payer: Self-pay | Admitting: Family Medicine

## 2022-06-04 DIAGNOSIS — M9901 Segmental and somatic dysfunction of cervical region: Secondary | ICD-10-CM | POA: Diagnosis not present

## 2022-06-04 DIAGNOSIS — M5136 Other intervertebral disc degeneration, lumbar region: Secondary | ICD-10-CM | POA: Diagnosis not present

## 2022-06-04 DIAGNOSIS — M5033 Other cervical disc degeneration, cervicothoracic region: Secondary | ICD-10-CM | POA: Diagnosis not present

## 2022-06-04 DIAGNOSIS — M9903 Segmental and somatic dysfunction of lumbar region: Secondary | ICD-10-CM | POA: Diagnosis not present

## 2022-06-06 DIAGNOSIS — M5136 Other intervertebral disc degeneration, lumbar region: Secondary | ICD-10-CM | POA: Diagnosis not present

## 2022-06-06 DIAGNOSIS — M9903 Segmental and somatic dysfunction of lumbar region: Secondary | ICD-10-CM | POA: Diagnosis not present

## 2022-06-06 DIAGNOSIS — M9901 Segmental and somatic dysfunction of cervical region: Secondary | ICD-10-CM | POA: Diagnosis not present

## 2022-06-06 DIAGNOSIS — M5033 Other cervical disc degeneration, cervicothoracic region: Secondary | ICD-10-CM | POA: Diagnosis not present

## 2022-06-11 ENCOUNTER — Encounter: Payer: Self-pay | Admitting: Family Medicine

## 2022-06-11 ENCOUNTER — Ambulatory Visit: Payer: Medicare PPO | Admitting: Family Medicine

## 2022-06-11 VITALS — BP 116/80 | HR 66 | Temp 97.5°F | Ht 68.0 in | Wt 262.0 lb

## 2022-06-11 DIAGNOSIS — M5033 Other cervical disc degeneration, cervicothoracic region: Secondary | ICD-10-CM | POA: Diagnosis not present

## 2022-06-11 DIAGNOSIS — M25552 Pain in left hip: Secondary | ICD-10-CM

## 2022-06-11 DIAGNOSIS — M9901 Segmental and somatic dysfunction of cervical region: Secondary | ICD-10-CM | POA: Diagnosis not present

## 2022-06-11 DIAGNOSIS — M9903 Segmental and somatic dysfunction of lumbar region: Secondary | ICD-10-CM | POA: Diagnosis not present

## 2022-06-11 DIAGNOSIS — M5136 Other intervertebral disc degeneration, lumbar region: Secondary | ICD-10-CM | POA: Diagnosis not present

## 2022-06-11 NOTE — Progress Notes (Signed)
Duane Alar, MD Phone: [phone]  [name] is a 82 y.o. male who presents today for same-day visit.  Left hip pain: This has been going on 2 months.  He notes no inciting event.  Restarted her and has gotten to the point where it hurts all the time.  Notes the discomfort level has gotten a little bit better.  He notes it hurts around his left lateral buttock.  The more he uses it the better he feels.  He has noted some decreased energy since this has been going on.  Has been using a cane and a rollator.  Tylenol helps some.  Social History   Tobacco Use  Smoking Status Never  Smokeless Tobacco Never    Current Outpatient Medications on File Prior to Visit  Medication Sig Dispense Refill   acetaminophen (TYLENOL) 500 MG tablet Take 500 mg by mouth every 6 (six) hours as needed.     aspirin 81 MG tablet Take 81 mg by mouth daily.     Bioflavonoid Products (PERIDIN-C PO) Take by mouth.     carvedilol (COREG) 12.5 MG tablet Take 1 tablet (12.5 mg total) by mouth 2 (two) times daily with a meal. 60 tablet 3   fluticasone (FLONASE) 50 MCG/ACT nasal spray Place into both nostrils daily.     Misc Natural Products (TART CHERRY ADVANCED PO) Take 425 mg by mouth daily.     MITIGARE 0.6 MG CAPS TAKE 0.6 MG BY MOUTH DAILY AS NEEDED (GOUT FLARE). 90 capsule 1   NON FORMULARY Clindamycin 150 mg     pantoprazole (PROTONIX) 40 MG tablet TAKE 1 TABLET BY MOUTH TWICE A DAY 180 tablet 3   polyethylene glycol (MIRALAX / GLYCOLAX) 17 g packet Take 17 g by mouth daily.     tamsulosin (FLOMAX) 0.4 MG CAPS capsule TAKE 1 CAPSULE BY MOUTH EVERY DAY 90 capsule 3   valsartan (DIOVAN) 320 MG tablet Take 1 tablet (320 mg total) by mouth daily. 90 tablet 1   Current Facility-Administered Medications on File Prior to Visit  Medication Dose Route Frequency Provider Last Rate Last Admin   0.9 %  sodium chloride infusion  500 mL Intravenous Once Armbruster, Willaim Rayas, MD         ROS see history of  present illness  Objective  Physical Exam Vitals:   06/11/22 1640  BP: 116/80  Pulse: 66  Temp: (!) 97.5 F (36.4 C)  SpO2: 98%    BP Readings from Last 3 Encounters:  06/11/22 116/80  04/16/22 134/68  03/16/22 130/60   Wt Readings from Last 3 Encounters:  06/11/22 262 lb (118.8 kg)  04/16/22 264 lb 9.6 oz (120 kg)  03/16/22 261 lb (118.4 kg)    Physical Exam Musculoskeletal:     Comments: Patient is nontender over his left lateral hip and his buttock in the area of his discomfort, he has decreased internal range of motion in the left hip, intact external range of motion left hip, right hip with good internal and external range of motion      Assessment/Plan: Please see individual problem list.  Left hip pain Assessment & Plan: Going on for 2 months.  We will get an x-ray.  Discussed the potential for using prednisone given chronic kidney issues preclude the use of NSAIDs.  Will see what his x-ray reveals first.  Orders: -     DG HIP UNILAT W OR W/O PELVIS 2-3 VIEWS LEFT; Future    Return if symptoms  worsen or fail to improve.   Duane Alar, MD [facility]

## 2022-06-11 NOTE — Assessment & Plan Note (Signed)
Going on for 2 months.  We will get an x-ray.  Discussed the potential for using prednisone given chronic kidney issues preclude the use of NSAIDs.  Will see what his x-ray reveals first.

## 2022-06-12 ENCOUNTER — Ambulatory Visit
Admission: RE | Admit: 2022-06-12 | Discharge: 2022-06-12 | Disposition: A | Discharge status: Home / Self Care | Payer: Medicare PPO | Source: Ambulatory Visit | Attending: Family Medicine | Admitting: Family Medicine

## 2022-06-12 DIAGNOSIS — M25552 Pain in left hip: Secondary | ICD-10-CM

## 2022-06-12 DIAGNOSIS — M160 Bilateral primary osteoarthritis of hip: Secondary | ICD-10-CM | POA: Diagnosis not present

## 2022-06-13 DIAGNOSIS — M5033 Other cervical disc degeneration, cervicothoracic region: Secondary | ICD-10-CM | POA: Diagnosis not present

## 2022-06-13 DIAGNOSIS — M9903 Segmental and somatic dysfunction of lumbar region: Secondary | ICD-10-CM | POA: Diagnosis not present

## 2022-06-13 DIAGNOSIS — M5136 Other intervertebral disc degeneration, lumbar region: Secondary | ICD-10-CM | POA: Diagnosis not present

## 2022-06-13 DIAGNOSIS — M9901 Segmental and somatic dysfunction of cervical region: Secondary | ICD-10-CM | POA: Diagnosis not present

## 2022-06-15 ENCOUNTER — Other Ambulatory Visit: Payer: Self-pay | Admitting: Family Medicine

## 2022-06-15 DIAGNOSIS — M25552 Pain in left hip: Secondary | ICD-10-CM

## 2022-06-15 DIAGNOSIS — M1612 Unilateral primary osteoarthritis, left hip: Secondary | ICD-10-CM

## 2022-06-18 ENCOUNTER — Encounter: Payer: Self-pay | Admitting: Family Medicine

## 2022-06-18 DIAGNOSIS — M5033 Other cervical disc degeneration, cervicothoracic region: Secondary | ICD-10-CM | POA: Diagnosis not present

## 2022-06-18 DIAGNOSIS — M9903 Segmental and somatic dysfunction of lumbar region: Secondary | ICD-10-CM | POA: Diagnosis not present

## 2022-06-18 DIAGNOSIS — M5136 Other intervertebral disc degeneration, lumbar region: Secondary | ICD-10-CM | POA: Diagnosis not present

## 2022-06-18 DIAGNOSIS — M9901 Segmental and somatic dysfunction of cervical region: Secondary | ICD-10-CM | POA: Diagnosis not present

## 2022-06-20 ENCOUNTER — Inpatient Hospital Stay: Payer: Medicare PPO | Attending: Radiation Oncology

## 2022-06-20 DIAGNOSIS — C61 Malignant neoplasm of prostate: Secondary | ICD-10-CM | POA: Diagnosis not present

## 2022-06-20 LAB — PSA: Prostatic Specific Antigen: <0.01 ng/mL (ref 0.00–4.00)

## 2022-06-26 ENCOUNTER — Ambulatory Visit: Payer: Medicare PPO | Admitting: Urology

## 2022-06-26 ENCOUNTER — Encounter: Payer: Self-pay | Admitting: Family Medicine

## 2022-06-26 ENCOUNTER — Encounter: Payer: Self-pay | Admitting: Urology

## 2022-06-26 VITALS — BP 178/80 | HR 73 | Ht 68.0 in | Wt 265.0 lb

## 2022-06-26 DIAGNOSIS — C61 Malignant neoplasm of prostate: Secondary | ICD-10-CM

## 2022-06-26 DIAGNOSIS — N400 Enlarged prostate without lower urinary tract symptoms: Secondary | ICD-10-CM

## 2022-06-26 MED ORDER — LEUPROLIDE ACETATE (6 MONTH) 45 MG ~~LOC~~ KIT
45.0000 mg | PACK | Freq: Once | SUBCUTANEOUS | Status: AC
  Administered 2022-06-26: 45 mg via SUBCUTANEOUS

## 2022-06-26 NOTE — Progress Notes (Signed)
Eligard SubQ Injection   Due to Prostate Cancer patient is present today for a Eligard Injection.  Medication: Eligard 6 month Dose: 45 mg  Location: left  Lot: [id number] Exp: 10/2023  Patient tolerated well, no complications were noted  Performed by: Teressa Lower, CMA  Per Dr. Apolinar Junes patient is to continue therapy for 2 year 2025-2026. Patient's next follow up was scheduled for October. This appointment was scheduled using wheel and given to patient today along with reminder continue on Vitamin D 800-1000iu and Calcium 1000-1200mg  daily while on Androgen Deprivation Therapy.  PA approval dates:

## 2022-06-26 NOTE — Progress Notes (Signed)
I, Amy L Pierron,acting as a scribe for Vanna Scotland, MD.,have documented all relevant documentation on the behalf of Vanna Scotland, MD,as directed by  Vanna Scotland, MD while in the presence of Vanna Scotland, MD.  06/26/2022 11:08 AM   [name] Dec 11, 1940 [id number]  Referring provider: Glori Luis, MD [address] [place],  [place] [address]  Chief Complaint  Patient presents with   Prostate Cancer    HPI: 82 year-old male with a personal history of prostate cancer and BPH returns today for a 6 month follow up.   His PSA at the time of diagnosis was 13.1 He is s/p fusion biopsy on 05/02/2021. Surgical pathology was consistent with Gleason 4+4 involving 3 of 12 cores affecting up to 80% primarily in the right apex, and Gleason 4+3 involving 1 of 12 cored affecting 5% primarily in the right mid. He has tolerated radiation well.  He is s/p HoLep and cystolitholapaxy in 2019.   He is s/p IMRT and on ADT. His most recent PSA remains undetectable as of 06-20-2022.  He is doing well overall. He experiences hot flashes but is tolerating them. He recently started having hip pain and will be seeing his doctor for that soon. His urinary symptoms are consistent. He is still taking Flomax.   PMH: Past Medical History:  Diagnosis Date   Allergy    Anxiety    Arthritis    Cataract    beginning stage bilateral   Diverticulosis    Elevated PSA    Esophageal stricture    Esophagitis    GERD (gastroesophageal reflux disease)    Gout    History of kidney stones    Hyperlipidemia    Hypertension    Inguinal hernia    Internal hemorrhoids    Pancreatitis    Prostate cancer    Tubular adenoma of colon     Surgical History: Past Surgical History:  Procedure Laterality Date   BACK SURGERY  1980's   L4/5   BALLOON DILATION N/A 11/17/2018   Procedure: URETHRAL BALLOON DILATION;  Surgeon: Vanna Scotland, MD;  Location: [facility];  Service: Urology;   Laterality: N/A;   CARPAL TUNNEL RELEASE Right 08/15/2016   Procedure: CARPAL TUNNEL RELEASE ENDOSCOPIC;  Surgeon: Christena Flake, MD;  Location: [facility];  Service: Orthopedics;  Laterality: Right;   CATARACT EXTRACTION W/PHACO Right 03/14/2021   Procedure: CATARACT EXTRACTION PHACO AND INTRAOCULAR LENS PLACEMENT (IOC) RIGHT 6.84 00:52.3;  Surgeon: Galen Manila, MD;  Location: [facility];  Service: Ophthalmology;  Laterality: Right;   CATARACT EXTRACTION W/PHACO Left 03/28/2021   Procedure: CATARACT EXTRACTION PHACO AND INTRAOCULAR LENS PLACEMENT (IOC) LEFT;  Surgeon: Galen Manila, MD;  Location: [facility];  Service: Ophthalmology;  Laterality: Left;   COLONOSCOPY     CYSTOSCOPY N/A 11/17/2018   Procedure: CYSTOSCOPY;  Surgeon: Vanna Scotland, MD;  Location: [facility];  Service: Urology;  Laterality: N/A;   CYSTOSCOPY WITH LITHOLAPAXY N/A 10/09/2017   Procedure: CYSTOSCOPY WITH LITHOLAPAXY;  Surgeon: Vanna Scotland, MD;  Location: [facility];  Service: Urology;  Laterality: N/A;   ESOPHAGOGASTRODUODENOSCOPY  11-21-06   Esophagitis, Stricture/dilated    HOLEP-LASER ENUCLEATION OF THE PROSTATE WITH MORCELLATION N/A 10/09/2017   Procedure: HOLEP-LASER ENUCLEATION OF THE PROSTATE WITH MORCELLATION;  Surgeon: Vanna Scotland, MD;  Location: [facility];  Service: Urology;  Laterality: N/A;   LYMPH NODE BIOPSY  1957   benign  lymph node removed   Right arm fracture  TONSILLECTOMY     TOTAL KNEE ARTHROPLASTY  09-02-02   Bilateral   UPPER GASTROINTESTINAL ENDOSCOPY      Home Medications:  Allergies as of 06/26/2022       Reactions   Penicillins Rash   Did it involve swelling of the face/tongue/throat, SOB, or low BP? No Did it involve sudden or severe rash/hives, skin peeling, or any reaction on the inside of your mouth or nose? No Did you need to seek medical attention at a hospital or doctor's office? No When did it last happen?      50 Years If all above  answers are "NO", may proceed with cephalosporin use.        Medication List        Accurate as of June 26, 2022 11:08 AM. If you have any questions, ask your nurse or doctor.          STOP taking these medications    TART CHERRY ADVANCED PO Stopped by: Vanna Scotland, MD       TAKE these medications    acetaminophen 500 MG tablet Commonly known as: TYLENOL Take 500 mg by mouth every 6 (six) hours as needed.   aspirin 81 MG tablet Take 81 mg by mouth daily.   carvedilol 12.5 MG tablet Commonly known as: COREG Take 1 tablet (12.5 mg total) by mouth 2 (two) times daily with a meal.   fluticasone 50 MCG/ACT nasal spray Commonly known as: FLONASE Place into both nostrils daily.   Mitigare 0.6 MG Caps Generic drug: Colchicine TAKE 0.6 MG BY MOUTH DAILY AS NEEDED (GOUT FLARE).   NON FORMULARY Clindamycin 150 mg   pantoprazole 40 MG tablet Commonly known as: PROTONIX TAKE 1 TABLET BY MOUTH TWICE A DAY   PERIDIN-C PO Take by mouth.   polyethylene glycol 17 g packet Commonly known as: MIRALAX / GLYCOLAX Take 17 g by mouth daily.   tamsulosin 0.4 MG Caps capsule Commonly known as: FLOMAX TAKE 1 CAPSULE BY MOUTH EVERY DAY   valsartan 320 MG tablet Commonly known as: DIOVAN Take 1 tablet (320 mg total) by mouth daily.        Allergies:  Allergies  Allergen Reactions   Penicillins Rash    Did it involve swelling of the face/tongue/throat, SOB, or low BP? No Did it involve sudden or severe rash/hives, skin peeling, or any reaction on the inside of your mouth or nose? No Did you need to seek medical attention at a hospital or doctor's office? No When did it last happen?      50 Years If all above answers are "NO", may proceed with cephalosporin use.     Family History: Family History  Problem Relation Age of Onset   Ulcers Father    Diverticulitis Father    Arthritis Father    Hypertension Brother    Obesity Brother    Cancer Brother         throat   Arthritis Mother    Depression Mother        Anxiety   Colon cancer Neg Hx    Colon polyps Neg Hx    Esophageal cancer Neg Hx    Rectal cancer Neg Hx    Stomach cancer Neg Hx    Kidney disease Neg Hx    Kidney cancer Neg Hx    Prostate cancer Neg Hx     Social History:  reports that he has never smoked. He has never used smokeless tobacco. He reports that  he does not drink alcohol and does not use drugs.   Physical Exam: BP (!) 178/80   Pulse 73   Ht  (1.727 m)   Wt 265 lb (120.2 kg)   BMI 40.29 kg/m   Constitutional:  Alert and oriented, No acute distress. HEENT: [place] AT, moist mucus membranes.  Trachea midline, no masses. Neurologic: Grossly intact, no focal deficits, moving all 4 extremities. Psychiatric: Normal mood and affect.   Assessment & Plan:    Prostate cancer   - S/p IMRT.  - Will continue ADT for two to three years. He is agreeable to continue with this protocol as recommended.   - Injection given today x 6 mo depo  - His PSA remains undetectable.   - Will have him return in six months for PSA and next Eligard (started in June of 2023).     2. BPH  - Tolerating urinary symptoms and continues on Flomax.  Return in about 6 months (around 12/26/2022) for PSA and Eligard.  I have reviewed the above documentation for accuracy and completeness, and I agree with the above.   Vanna Scotland, MD   [facility] [address] [place], [place] [address] [phone]

## 2022-06-27 ENCOUNTER — Encounter: Payer: Self-pay | Admitting: Radiation Oncology

## 2022-06-27 ENCOUNTER — Ambulatory Visit
Admission: RE | Admit: 2022-06-27 | Discharge: 2022-06-27 | Disposition: A | Discharge status: Home / Self Care | Payer: Medicare PPO | Source: Ambulatory Visit | Attending: Radiation Oncology | Admitting: Radiation Oncology

## 2022-06-27 VITALS — BP 162/88 | HR 75 | Temp 98.3°F | Resp 16 | Ht 68.0 in | Wt 263.0 lb

## 2022-06-27 DIAGNOSIS — Z191 Hormone sensitive malignancy status: Secondary | ICD-10-CM | POA: Diagnosis not present

## 2022-06-27 DIAGNOSIS — C61 Malignant neoplasm of prostate: Secondary | ICD-10-CM | POA: Insufficient documentation

## 2022-06-27 DIAGNOSIS — Z923 Personal history of irradiation: Secondary | ICD-10-CM | POA: Insufficient documentation

## 2022-06-27 DIAGNOSIS — C775 Secondary and unspecified malignant neoplasm of intrapelvic lymph nodes: Secondary | ICD-10-CM | POA: Insufficient documentation

## 2022-06-27 NOTE — Progress Notes (Signed)
Radiation Oncology Follow up Note  Name: [name]   Date:   06/27/2022 MRN:  [id number] DOB: 04-05-1940    This 82 y.o. male presents to the clinic today for 93-month follow-up status post IMRT radiation therapy to his prostate and pelvic nodes for Gleason 8 (4+4) adenocarcinoma presenting with a PSA of 13.  REFERRING PROVIDER: Glori Luis, MD  HPI: Patient is an 82 year old male now about 10 months having completed IMRT radiation therapy to his prostate and pelvic nodes for a Gleason 8 adenocarcinoma presented with a PSA of 13 seen today in routine follow-up he is doing well.  He specifically denies any increased lower urinary tract symptoms diarrhea or fatigue.Marland Kitchen  He is currently on ADT therapy through urology tolerating that well.  His most recent PSA is less than 0.01 stable from 6 months ago.  COMPLICATIONS OF TREATMENT: none  FOLLOW UP COMPLIANCE: keeps appointments   PHYSICAL EXAM:  BP (!) 162/88 (BP Location: Left Arm, Patient Position: Sitting, Cuff Size: Large)   Pulse 75   Temp 98.3 F (36.8 C) (Tympanic)   Resp 16   Ht  (1.727 m)   Wt 263 lb (119.3 kg)   BMI 39.99 kg/m  Well-developed well-nourished patient in NAD. HEENT reveals PERLA, EOMI, discs not visualized.  Oral cavity is clear. No oral mucosal lesions are identified. Neck is clear without evidence of cervical or supraclavicular adenopathy. Lungs are clear to A&P. Cardiac examination is essentially unremarkable with regular rate and rhythm without murmur rub or thrill. Abdomen is benign with no organomegaly or masses noted. Motor sensory and DTR levels are equal and symmetric in the upper and lower extremities. Cranial nerves II through XII are grossly intact. Proprioception is intact. No peripheral adenopathy or edema is identified. No motor or sensory levels are noted. Crude visual fields are within normal range.  RADIOLOGY RESULTS: No current films for review  PLAN: Present time patient is under  excellent biochemical control of his prostate cancer.  Of asked to see him back in 6 months for follow-up.  Patient knows to call at anytime with any concerns.  He continues follow-up care and treatment through urology.  I would like to take this opportunity to thank you for allowing me to participate in the care of your patient.Carmina Miller, MD

## 2022-06-28 ENCOUNTER — Telehealth: Payer: Self-pay

## 2022-06-28 NOTE — Telephone Encounter (Signed)
Patient sent a my chart message with 4 new medications. Medications was sent into the Pharmacy.

## 2022-06-29 ENCOUNTER — Other Ambulatory Visit: Payer: Self-pay | Admitting: Family Medicine

## 2022-06-29 DIAGNOSIS — I10 Essential (primary) hypertension: Secondary | ICD-10-CM

## 2022-07-02 NOTE — Telephone Encounter (Signed)
Noted  

## 2022-07-04 DIAGNOSIS — M9901 Segmental and somatic dysfunction of cervical region: Secondary | ICD-10-CM | POA: Diagnosis not present

## 2022-07-04 DIAGNOSIS — M5136 Other intervertebral disc degeneration, lumbar region: Secondary | ICD-10-CM | POA: Diagnosis not present

## 2022-07-04 DIAGNOSIS — M5033 Other cervical disc degeneration, cervicothoracic region: Secondary | ICD-10-CM | POA: Diagnosis not present

## 2022-07-04 DIAGNOSIS — M9903 Segmental and somatic dysfunction of lumbar region: Secondary | ICD-10-CM | POA: Diagnosis not present

## 2022-07-05 DIAGNOSIS — M25552 Pain in left hip: Secondary | ICD-10-CM | POA: Diagnosis not present

## 2022-07-09 DIAGNOSIS — M5136 Other intervertebral disc degeneration, lumbar region: Secondary | ICD-10-CM | POA: Diagnosis not present

## 2022-07-09 DIAGNOSIS — M9901 Segmental and somatic dysfunction of cervical region: Secondary | ICD-10-CM | POA: Diagnosis not present

## 2022-07-09 DIAGNOSIS — M5033 Other cervical disc degeneration, cervicothoracic region: Secondary | ICD-10-CM | POA: Diagnosis not present

## 2022-07-09 DIAGNOSIS — M9903 Segmental and somatic dysfunction of lumbar region: Secondary | ICD-10-CM | POA: Diagnosis not present

## 2022-07-11 DIAGNOSIS — M25552 Pain in left hip: Secondary | ICD-10-CM | POA: Diagnosis not present

## 2022-07-14 ENCOUNTER — Other Ambulatory Visit: Payer: Self-pay | Admitting: Family Medicine

## 2022-07-16 ENCOUNTER — Encounter: Payer: Self-pay | Admitting: Family Medicine

## 2022-07-16 ENCOUNTER — Ambulatory Visit: Payer: Medicare PPO | Admitting: Family Medicine

## 2022-07-16 VITALS — BP 130/84 | HR 60 | Temp 97.7°F | Ht 68.0 in | Wt 257.2 lb

## 2022-07-16 DIAGNOSIS — I10 Essential (primary) hypertension: Secondary | ICD-10-CM

## 2022-07-16 DIAGNOSIS — K219 Gastro-esophageal reflux disease without esophagitis: Secondary | ICD-10-CM

## 2022-07-16 DIAGNOSIS — M1000 Idiopathic gout, unspecified site: Secondary | ICD-10-CM

## 2022-07-16 DIAGNOSIS — M25552 Pain in left hip: Secondary | ICD-10-CM | POA: Diagnosis not present

## 2022-07-16 MED ORDER — PANTOPRAZOLE SODIUM 40 MG PO TBEC: 40.0000 mg | DELAYED_RELEASE_TABLET | Freq: Two times a day (BID) | ORAL | 3 refills | Status: DC

## 2022-07-16 NOTE — Patient Instructions (Signed)
Please stop the metoprolol. If your BP goes up a lot with stopping this please let me know.

## 2022-07-16 NOTE — Assessment & Plan Note (Signed)
Improved.  Patient continues to have some trouble walking more than 200 feet without having to stop.  Handicap placard form filled out for him.  He will continue to build up his stamina.

## 2022-07-16 NOTE — Assessment & Plan Note (Signed)
Chronic issue.  No recent flares.  Plan to check uric acid with his next set of labs.  He can continue Mitigare as needed.

## 2022-07-16 NOTE — Assessment & Plan Note (Signed)
Chronic issue.  Continue Protonix 40 mg twice daily.

## 2022-07-16 NOTE — Assessment & Plan Note (Signed)
Chronic issue.  Slightly above goal at home at times.  Patient has been taking carvedilol and metoprolol.  Will discontinue metoprolol and have him continue on carvedilol and valsartan.  He will continue carvedilol 12.5 mg twice daily and valsartan 320 mg daily.  He will return in 2 weeks for a visit with me to recheck his blood pressure and his pulse.  At that time we can consider increasing his carvedilol.

## 2022-07-16 NOTE — Progress Notes (Signed)
Marikay Alar, MD Phone: [phone]  [name] is a 82 y.o. male who presents today for f/u.  HYPERTENSION Disease Monitoring Home BP Monitoring generally upper 130s-low 140s Chest pain- no    Dyspnea- no Medications Compliance-  taking coreg, metoprolol, valsartan.   Edema- no BMET    Component Value Date/Time   NA 141 03/16/2022 1042   NA 137 09/01/2013 0410   K 4.0 03/16/2022 1042   K 3.6 09/01/2013 0410   CL 105 03/16/2022 1042   CL 106 09/01/2013 0410   CO2 27 03/16/2022 1042   CO2 23 09/01/2013 0410   GLUCOSE 107 (H) 03/16/2022 1042   GLUCOSE 107 (H) 09/01/2013 0410   BUN 19 03/16/2022 1042   BUN 13 06/24/2017 1039   BUN 14 09/01/2013 0410   CREATININE 1.35 03/16/2022 1042   CREATININE 1.40 (H) 09/01/2013 0410   CALCIUM 9.1 03/16/2022 1042   CALCIUM 8.0 (L) 09/01/2013 0410   GFRNONAA 46 (L) 09/01/2021 1201   GFRNONAA 49 (L) 09/01/2013 0410   GFRAA 57 (L) 10/04/2017 1306   GFRAA 57 (L) 09/01/2013 0410   GERD:   Reflux symptoms: no   Abd pain: no   Blood in stool: no  Dysphagia: no   EGD: 2008  Medication: taking nexium  Left hip pain: Patient notes this has improved quite a bit after getting an injection.  He notes some fatigue though he is trying to build up his stamina as he is able to be more active after the injection in his hip.  Gout: Patient notes he has not had a gout flare in over a year.  He limits beef to 1-2 times a week and that is beneficial.  He has not taken Mitigare in quite some time.   Social History   Tobacco Use  Smoking Status Never  Smokeless Tobacco Never    Current Outpatient Medications on File Prior to Visit  Medication Sig Dispense Refill   acetaminophen (TYLENOL) 500 MG tablet Take 500 mg by mouth every 6 (six) hours as needed.     aspirin 81 MG tablet Take 81 mg by mouth daily.     Bioflavonoid Products (PERIDIN-C PO) Take by mouth.     carvedilol (COREG) 12.5 MG tablet TAKE 1 TABLET (12.5MG  TOTAL) BY MOUTH TWICE  A DAY WITH MEALS 180 tablet 1   clindamycin (CLEOCIN) 150 MG capsule Take 600 mg by mouth as needed. Patient takes (4) 150 mg clindamycin tablets 1 hour prior to dental procedures     fluticasone (FLONASE) 50 MCG/ACT nasal spray Place into both nostrils daily.     MITIGARE 0.6 MG CAPS TAKE 0.6 MG BY MOUTH DAILY AS NEEDED (GOUT FLARE). 90 capsule 1   Multiple Vitamins-Minerals (CENTRUM SILVER 50+MEN PO) Take by mouth.     NON FORMULARY Clindamycin 150 mg     polyethylene glycol (MIRALAX / GLYCOLAX) 17 g packet Take 17 g by mouth daily.     tamsulosin (FLOMAX) 0.4 MG CAPS capsule TAKE 1 CAPSULE BY MOUTH EVERY DAY 90 capsule 3   valsartan (DIOVAN) 320 MG tablet TAKE 1 TABLET BY MOUTH EVERY DAY 90 tablet 1   Current Facility-Administered Medications on File Prior to Visit  Medication Dose Route Frequency Provider Last Rate Last Admin   0.9 %  sodium chloride infusion  500 mL Intravenous Once Armbruster, Willaim Rayas, MD         ROS see history of present illness  Objective  Physical Exam Vitals:   07/16/22  0925  BP: 130/84  Pulse: 60  Temp: 97.7 F (36.5 C)  SpO2: 98%    BP Readings from Last 3 Encounters:  07/16/22 130/84  06/27/22 (!) 162/88  06/26/22 (!) 178/80   Wt Readings from Last 3 Encounters:  07/16/22 257 lb 3.2 oz (116.7 kg)  06/27/22 263 lb (119.3 kg)  06/26/22 265 lb (120.2 kg)    Physical Exam Constitutional:      General: He is not in acute distress.    Appearance: He is not diaphoretic.  Cardiovascular:     Rate and Rhythm: Normal rate and regular rhythm.     Heart sounds: Normal heart sounds.  Pulmonary:     Effort: Pulmonary effort is normal.     Breath sounds: Normal breath sounds.  Musculoskeletal:     Right lower leg: No edema.     Left lower leg: No edema.  Skin:    General: Skin is warm and dry.  Neurological:     Mental Status: He is alert.      Assessment/Plan: Please see individual problem list.  Essential hypertension Assessment &  Plan: Chronic issue.  Slightly above goal at home at times.  Patient has been taking carvedilol and metoprolol.  Will discontinue metoprolol and have him continue on carvedilol and valsartan.  He will continue carvedilol 12.5 mg twice daily and valsartan 320 mg daily.  He will return in 2 weeks for a visit with me to recheck his blood pressure and his pulse.  At that time we can consider increasing his carvedilol.   Gastroesophageal reflux disease without esophagitis Assessment & Plan: Chronic issue.  Continue Protonix 40 mg twice daily.  Orders: -     Pantoprazole Sodium; Take 1 tablet (40 mg total) by mouth 2 (two) times daily.  Dispense: 180 tablet; Refill: 3  Idiopathic gout, unspecified chronicity, unspecified site Assessment & Plan: Chronic issue.  No recent flares.  Plan to check uric acid with his next set of labs.  He can continue Mitigare as needed.   Left hip pain Assessment & Plan: Improved.  Patient continues to have some trouble walking more than 200 feet without having to stop.  Handicap placard form filled out for him.  He will continue to build up his stamina.      Return in about 2 weeks (around 07/30/2022) for htn with PCP.   Marikay Alar, MD [facility]

## 2022-08-09 DIAGNOSIS — M9901 Segmental and somatic dysfunction of cervical region: Secondary | ICD-10-CM | POA: Diagnosis not present

## 2022-08-09 DIAGNOSIS — M9903 Segmental and somatic dysfunction of lumbar region: Secondary | ICD-10-CM | POA: Diagnosis not present

## 2022-08-09 DIAGNOSIS — M5033 Other cervical disc degeneration, cervicothoracic region: Secondary | ICD-10-CM | POA: Diagnosis not present

## 2022-08-09 DIAGNOSIS — M5136 Other intervertebral disc degeneration, lumbar region: Secondary | ICD-10-CM | POA: Diagnosis not present

## 2022-08-10 ENCOUNTER — Other Ambulatory Visit: Payer: Self-pay

## 2022-08-10 ENCOUNTER — Ambulatory Visit: Payer: Medicare PPO | Admitting: Family Medicine

## 2022-08-10 ENCOUNTER — Encounter: Payer: Self-pay | Admitting: Family Medicine

## 2022-08-10 VITALS — BP 140/86 | HR 63 | Temp 98.0°F | Ht 68.0 in | Wt 258.8 lb

## 2022-08-10 DIAGNOSIS — G44209 Tension-type headache, unspecified, not intractable: Secondary | ICD-10-CM | POA: Diagnosis not present

## 2022-08-10 DIAGNOSIS — I10 Essential (primary) hypertension: Secondary | ICD-10-CM

## 2022-08-10 DIAGNOSIS — R5383 Other fatigue: Secondary | ICD-10-CM | POA: Diagnosis not present

## 2022-08-10 DIAGNOSIS — D649 Anemia, unspecified: Secondary | ICD-10-CM

## 2022-08-10 LAB — CBC WITH DIFFERENTIAL/PLATELET
Basophils Absolute: 0 10*3/uL (ref 0.0–0.1)
Basophils Relative: 0.4 % (ref 0.0–3.0)
Eosinophils Absolute: 0.2 10*3/uL (ref 0.0–0.7)
Eosinophils Relative: 3.9 % (ref 0.0–5.0)
HCT: 37.3 % — ABNORMAL LOW (ref 39.0–52.0)
Hemoglobin: 12.4 g/dL — ABNORMAL LOW (ref 13.0–17.0)
Lymphocytes Relative: 20.6 % (ref 12.0–46.0)
Lymphs Abs: 1 10*3/uL (ref 0.7–4.0)
MCHC: 33.3 g/dL (ref 30.0–36.0)
MCV: 93.5 fl (ref 78.0–100.0)
Monocytes Absolute: 0.4 10*3/uL (ref 0.1–1.0)
Monocytes Relative: 8.3 % (ref 3.0–12.0)
Neutro Abs: 3.3 10*3/uL (ref 1.4–7.7)
Neutrophils Relative %: 66.8 % (ref 43.0–77.0)
Platelets: 231 10*3/uL (ref 150.0–400.0)
RBC: 3.99 Mil/uL — ABNORMAL LOW (ref 4.22–5.81)
RDW: 14.5 % (ref 11.5–15.5)
WBC: 4.9 10*3/uL (ref 4.0–10.5)

## 2022-08-10 LAB — COMPREHENSIVE METABOLIC PANEL
ALT: 21 U/L (ref 0–53)
AST: 20 U/L (ref 0–37)
Albumin: 3.8 g/dL (ref 3.5–5.2)
Alkaline Phosphatase: 82 U/L (ref 39–117)
BUN: 16 mg/dL (ref 6–23)
CO2: 28 mEq/L (ref 19–32)
Calcium: 9.2 mg/dL (ref 8.4–10.5)
Chloride: 103 mEq/L (ref 96–112)
Creatinine, Ser: 1.33 mg/dL (ref 0.40–1.50)
GFR: 49.66 mL/min — ABNORMAL LOW (ref >60.00)
Glucose, Bld: 112 mg/dL — ABNORMAL HIGH (ref 70–99)
Potassium: 4.1 mEq/L (ref 3.5–5.1)
Sodium: 138 mEq/L (ref 135–145)
Total Bilirubin: 0.4 mg/dL (ref 0.2–1.2)
Total Protein: 7.1 g/dL (ref 6.0–8.3)

## 2022-08-10 LAB — VITAMIN D 25 HYDROXY (VIT D DEFICIENCY, FRACTURES): VITD: 37.54 ng/mL (ref 30.00–100.00)

## 2022-08-10 LAB — TSH: TSH: 2.56 u[IU]/mL (ref 0.35–5.50)

## 2022-08-10 LAB — VITAMIN B12: Vitamin B-12: 455 pg/mL (ref 211–911)

## 2022-08-10 MED ORDER — HYDROCHLOROTHIAZIDE 12.5 MG PO TABS
12.5000 mg | ORAL_TABLET | Freq: Every day | ORAL | 2 refills | Status: DC
Start: 2022-08-10 — End: 2022-10-10

## 2022-08-10 NOTE — Assessment & Plan Note (Signed)
Chronic issue.  Above goal.  We will add HCTZ 12.5 mg daily.  He will continue valsartan 320 mg daily and carvedilol 12.5 mg twice daily.  Advised to monitor for any side effects with the hydrochlorothiazide or lightheadedness with this addition.  If he develops any symptoms he will let us know.  Follow-up in 1 month.

## 2022-08-10 NOTE — Assessment & Plan Note (Signed)
Patient with chronic history of tension headaches.  Notes these are occurring little more frequently recently.  This may be related to his fatigue.  He does report an adequate water intake and that could be playing a role as well.  Encouraged increased water intake.  Will evaluate his fatigue with lab work.  If no obvious causes found and his headaches persist I discussed we would need to consider imaging of his head.  Location of headache and consistency with prior headaches makes temporal arteritis unlikely.

## 2022-08-10 NOTE — Patient Instructions (Addendum)
We are going to start HCTZ for your blood pressure.  Please continue your carvedilol and valsartan.  I will see you back in 1 month.  If you notice any side effects with starting the HCTZ please let us know. Will contact you with your lab results.

## 2022-08-10 NOTE — Assessment & Plan Note (Signed)
Chronic issue that seems to have worsened recently.  Will check lab work as outlined.

## 2022-08-10 NOTE — Progress Notes (Signed)
Marikay Alar, MD Phone: [phone]  [name] is a 82 y.o. male who presents today for f/u.  HYPERTENSION Disease Monitoring Home BP Monitoring 127-154/72-89 Chest pain- no    Dyspnea- no Medications Compliance-  taking coreg, valsartan.  Edema- no BMET    Component Value Date/Time   NA 141 03/16/2022 1042   NA 137 09/01/2013 0410   K 4.0 03/16/2022 1042   K 3.6 09/01/2013 0410   CL 105 03/16/2022 1042   CL 106 09/01/2013 0410   CO2 27 03/16/2022 1042   CO2 23 09/01/2013 0410   GLUCOSE 107 (H) 03/16/2022 1042   GLUCOSE 107 (H) 09/01/2013 0410   BUN 19 03/16/2022 1042   BUN 13 06/24/2017 1039   BUN 14 09/01/2013 0410   CREATININE 1.35 03/16/2022 1042   CREATININE 1.40 (H) 09/01/2013 0410   CALCIUM 9.1 03/16/2022 1042   CALCIUM 8.0 (L) 09/01/2013 0410   GFRNONAA 46 (L) 09/01/2021 1201   GFRNONAA 49 (L) 09/01/2013 0410   GFRAA 57 (L) 10/04/2017 1306   GFRAA 57 (L) 09/01/2013 0410   Headache/fatigue: Patient notes over the last month or so he has more frequent headaches than usual.  He does have a history of tension type headaches with frontal headaches occurring previously.  These are just more frequent.  They typically occur late in the afternoon when he is more fatigued.  He takes Tylenol for this.  No numbness or weakness.  Social History   Tobacco Use  Smoking Status Never  Smokeless Tobacco Never    Current Outpatient Medications on File Prior to Visit  Medication Sig Dispense Refill   acetaminophen (TYLENOL) 500 MG tablet Take 500 mg by mouth every 6 (six) hours as needed.     aspirin 81 MG tablet Take 81 mg by mouth daily.     Bioflavonoid Products (PERIDIN-C PO) Take by mouth.     carvedilol (COREG) 12.5 MG tablet TAKE 1 TABLET (12.5MG  TOTAL) BY MOUTH TWICE A DAY WITH MEALS 180 tablet 1   fluticasone (FLONASE) 50 MCG/ACT nasal spray Place into both nostrils daily.     MITIGARE 0.6 MG CAPS TAKE 0.6 MG BY MOUTH DAILY AS NEEDED (GOUT FLARE). 90 capsule  1   Multiple Vitamins-Minerals (CENTRUM SILVER 50+MEN PO) Take by mouth.     NON FORMULARY Clindamycin 150 mg     pantoprazole (PROTONIX) 40 MG tablet Take 1 tablet (40 mg total) by mouth 2 (two) times daily. 180 tablet 3   polyethylene glycol (MIRALAX / GLYCOLAX) 17 g packet Take 17 g by mouth daily.     tamsulosin (FLOMAX) 0.4 MG CAPS capsule TAKE 1 CAPSULE BY MOUTH EVERY DAY 90 capsule 3   valsartan (DIOVAN) 320 MG tablet TAKE 1 TABLET BY MOUTH EVERY DAY 90 tablet 1   Current Facility-Administered Medications on File Prior to Visit  Medication Dose Route Frequency Provider Last Rate Last Admin   0.9 %  sodium chloride infusion  500 mL Intravenous Once Armbruster, Willaim Rayas, MD         ROS see history of present illness  Objective  Physical Exam Vitals:   08/10/22 1003 08/10/22 1018  BP: (!) 144/88 (!) 140/86  Pulse: 63   Temp: 98 F (36.7 C)   SpO2: 96%     BP Readings from Last 3 Encounters:  08/10/22 (!) 140/86  07/16/22 130/84  06/27/22 (!) 162/88   Wt Readings from Last 3 Encounters:  08/10/22 258 lb 12.8 oz (117.4 kg)  07/16/22  257 lb 3.2 oz (116.7 kg)  06/27/22 263 lb (119.3 kg)    Physical Exam Constitutional:      General: He is not in acute distress.    Appearance: He is not diaphoretic.  HENT:     Head:     Comments: No tenderness or abnormalities palpated over his temples bilaterally Cardiovascular:     Rate and Rhythm: Normal rate and regular rhythm.     Heart sounds: Normal heart sounds.  Pulmonary:     Effort: Pulmonary effort is normal.     Breath sounds: Normal breath sounds.  Skin:    General: Skin is warm and dry.  Neurological:     Mental Status: He is alert.     Comments: 5/5 strength in bilateral biceps, triceps, grip, quads, hamstrings, plantar and dorsiflexion, sensation to light touch intact in bilateral UE and LE, normal gait      Assessment/Plan: Please see individual problem list.  Essential hypertension Assessment &  Plan: Chronic issue.  Above goal.  We will add HCTZ 12.5 mg daily.  He will continue valsartan 320 mg daily and carvedilol 12.5 mg twice daily.  Advised to monitor for any side effects with the hydrochlorothiazide or lightheadedness with this addition.  If he develops any symptoms he will let us know.  Follow-up in 1 month.  Orders: -     hydroCHLOROthiazide; Take 1 tablet (12.5 mg total) by mouth daily.  Dispense: 30 tablet; Refill: 2  Tension headache Assessment & Plan: Patient with chronic history of tension headaches.  Notes these are occurring little more frequently recently.  This may be related to his fatigue.  He does report an adequate water intake and that could be playing a role as well.  Encouraged increased water intake.  Will evaluate his fatigue with lab work.  If no obvious causes found and his headaches persist I discussed we would need to consider imaging of his head.  Location of headache and consistency with prior headaches makes temporal arteritis unlikely.   Decreased energy Assessment & Plan: Chronic issue that seems to have worsened recently.  Will check lab work as outlined.  Orders: -     Comprehensive metabolic panel -     TSH -     Vitamin B12 -     CBC with Differential/Platelet -     VITAMIN D 25 Hydroxy (Vit-D Deficiency, Fractures)    Return in about 1 month (around 09/09/2022) for htn with labs.   Marikay Alar, MD [facility]

## 2022-08-20 ENCOUNTER — Ambulatory Visit: Payer: Medicare PPO | Admitting: Family Medicine

## 2022-08-22 NOTE — Addendum Note (Signed)
Addended by: Glori Luis on: 08/22/2022 08:55 AM   Modules accepted: Orders

## 2022-09-05 ENCOUNTER — Other Ambulatory Visit (INDEPENDENT_AMBULATORY_CARE_PROVIDER_SITE_OTHER): Payer: Medicare PPO

## 2022-09-05 DIAGNOSIS — D649 Anemia, unspecified: Secondary | ICD-10-CM | POA: Diagnosis not present

## 2022-09-05 LAB — CBC
HCT: 36.9 % — ABNORMAL LOW (ref 39.0–52.0)
Hemoglobin: 12.2 g/dL — ABNORMAL LOW (ref 13.0–17.0)
MCHC: 33.1 g/dL (ref 30.0–36.0)
MCV: 93.6 fl (ref 78.0–100.0)
Platelets: 191 10*3/uL (ref 150.0–400.0)
RBC: 3.94 Mil/uL — ABNORMAL LOW (ref 4.22–5.81)
RDW: 14.6 % (ref 11.5–15.5)
WBC: 6.2 10*3/uL (ref 4.0–10.5)

## 2022-09-07 ENCOUNTER — Telehealth: Payer: Self-pay

## 2022-09-07 ENCOUNTER — Other Ambulatory Visit: Payer: Self-pay | Admitting: Family Medicine

## 2022-09-07 DIAGNOSIS — D649 Anemia, unspecified: Secondary | ICD-10-CM

## 2022-09-07 NOTE — Telephone Encounter (Signed)
Left message to call the office back regarding lab results and to schedule the additional labs at the Patient's convenience.

## 2022-09-07 NOTE — Telephone Encounter (Signed)
-----   Message from Glori Luis, MD sent at 09/07/2022  8:52 AM EDT ----- Please let the patient know that he continues to be anemic.  It is mild.  I would like to check some additional lab work to look for cause.  Orders have been placed.  He can have these at his convenience.

## 2022-09-10 ENCOUNTER — Encounter: Payer: Self-pay | Admitting: Family Medicine

## 2022-09-10 ENCOUNTER — Ambulatory Visit: Payer: Medicare PPO | Admitting: Family Medicine

## 2022-09-10 VITALS — BP 122/68 | HR 62 | Temp 97.6°F | Ht 68.0 in | Wt 260.2 lb

## 2022-09-10 DIAGNOSIS — R5383 Other fatigue: Secondary | ICD-10-CM

## 2022-09-10 DIAGNOSIS — I10 Essential (primary) hypertension: Secondary | ICD-10-CM | POA: Diagnosis not present

## 2022-09-10 DIAGNOSIS — G44209 Tension-type headache, unspecified, not intractable: Secondary | ICD-10-CM | POA: Diagnosis not present

## 2022-09-10 DIAGNOSIS — D649 Anemia, unspecified: Secondary | ICD-10-CM

## 2022-09-10 LAB — BASIC METABOLIC PANEL
BUN: 25 mg/dL — ABNORMAL HIGH (ref 6–23)
CO2: 30 mEq/L (ref 19–32)
Calcium: 9.8 mg/dL (ref 8.4–10.5)
Chloride: 101 mEq/L (ref 96–112)
Creatinine, Ser: 1.54 mg/dL — ABNORMAL HIGH (ref 0.40–1.50)
GFR: 41.62 mL/min — ABNORMAL LOW (ref >60.00)
Glucose, Bld: 104 mg/dL — ABNORMAL HIGH (ref 70–99)
Potassium: 4.8 mEq/L (ref 3.5–5.1)
Sodium: 141 mEq/L (ref 135–145)

## 2022-09-10 LAB — IBC + FERRITIN
Ferritin: 101.5 ng/mL (ref 22.0–322.0)
Iron: 99 ug/dL (ref 42–165)
Saturation Ratios: 31 % (ref 20.0–50.0)
TIBC: 319.2 ug/dL (ref 250.0–450.0)
Transferrin: 228 mg/dL (ref 212.0–360.0)

## 2022-09-10 MED ORDER — CARVEDILOL 6.25 MG PO TABS: 6.2500 mg | ORAL_TABLET | Freq: Two times a day (BID) | ORAL | 2 refills | Status: DC

## 2022-09-10 NOTE — Assessment & Plan Note (Addendum)
Chronic issue. Patient today reports exertional fatigue.  Fatigue has been a chronic issue and lab workup has not revealed a specific cause.  Discussed that this could be related to deconditioning and obesity, his carvedilol, or a cardiac cause.  Discussed that I doubt his mildly low hemoglobin would be contributing to this.  We will try reducing carvedilol to 6.25 mg twice daily.  Given his description of the exertional component we will refer to cardiology to evaluate for an underlying cardiac cause.  Advised not to increase his activity level any until evaluated by cardiology.

## 2022-09-10 NOTE — Assessment & Plan Note (Signed)
Undetermined cause.  Mild.  Will check iron studies today.  Patient notes he self started an iron supplement about a month ago.

## 2022-09-10 NOTE — Progress Notes (Signed)
Marikay Alar, MD Phone: [phone]  [name] is a 82 y.o. male who presents today for f/u.  HYPERTENSION Disease Monitoring Home BP Monitoring 120-128/62-84 Chest pain- no    Dyspnea- no Medications Compliance-  taking coreg, hydrochlorothiazide, valsartan  Edema- no BMET    Component Value Date/Time   NA 138 08/10/2022 1021   NA 137 09/01/2013 0410   K 4.1 08/10/2022 1021   K 3.6 09/01/2013 0410   CL 103 08/10/2022 1021   CL 106 09/01/2013 0410   CO2 28 08/10/2022 1021   CO2 23 09/01/2013 0410   GLUCOSE 112 (H) 08/10/2022 1021   GLUCOSE 107 (H) 09/01/2013 0410   BUN 16 08/10/2022 1021   BUN 13 06/24/2017 1039   BUN 14 09/01/2013 0410   CREATININE 1.33 08/10/2022 1021   CREATININE 1.40 (H) 09/01/2013 0410   CALCIUM 9.2 08/10/2022 1021   CALCIUM 8.0 (L) 09/01/2013 0410   GFRNONAA 46 (L) 09/01/2021 1201   GFRNONAA 49 (L) 09/01/2013 0410   GFRAA 57 (L) 10/04/2017 1306   GFRAA 57 (L) 09/01/2013 0410   Fatigue: Patient notes this continues to be an issue.  Notes anytime he exerts himself for 10 or 15 minutes he will get tired and have to rest.  He notes no chest pain or shortness of breath with this.  He wondered if it was related to his hemoglobin being low. Has not seen a cardiologist anytime recently.   Patient reports headaches resolved.   Social History   Tobacco Use  Smoking Status Never  Smokeless Tobacco Never    Current Outpatient Medications on File Prior to Visit  Medication Sig Dispense Refill   acetaminophen (TYLENOL) 500 MG tablet Take 500 mg by mouth every 6 (six) hours as needed.     aspirin 81 MG tablet Take 81 mg by mouth daily.     Bioflavonoid Products (PERIDIN-C PO) Take by mouth.     fluticasone (FLONASE) 50 MCG/ACT nasal spray Place into both nostrils daily.     hydrochlorothiazide (HYDRODIURIL) 12.5 MG tablet Take 1 tablet (12.5 mg total) by mouth daily. 30 tablet 2   MITIGARE 0.6 MG CAPS TAKE 0.6 MG BY MOUTH DAILY AS NEEDED (GOUT  FLARE). 90 capsule 1   Multiple Vitamins-Minerals (CENTRUM SILVER 50+MEN PO) Take by mouth.     NON FORMULARY Clindamycin 150 mg     pantoprazole (PROTONIX) 40 MG tablet Take 1 tablet (40 mg total) by mouth 2 (two) times daily. 180 tablet 3   polyethylene glycol (MIRALAX / GLYCOLAX) 17 g packet Take 17 g by mouth daily.     tamsulosin (FLOMAX) 0.4 MG CAPS capsule TAKE 1 CAPSULE BY MOUTH EVERY DAY 90 capsule 3   valsartan (DIOVAN) 320 MG tablet TAKE 1 TABLET BY MOUTH EVERY DAY 90 tablet 1   Current Facility-Administered Medications on File Prior to Visit  Medication Dose Route Frequency Provider Last Rate Last Admin   0.9 %  sodium chloride infusion  500 mL Intravenous Once Armbruster, Willaim Rayas, MD         ROS see history of present illness  Objective  Physical Exam Vitals:   09/10/22 0941  BP: 122/68  Pulse: 62  Temp: 97.6 F (36.4 C)  SpO2: 99%    BP Readings from Last 3 Encounters:  09/10/22 122/68  08/10/22 (!) 140/86  07/16/22 130/84   Wt Readings from Last 3 Encounters:  09/10/22 260 lb 3.2 oz (118 kg)  08/10/22 258 lb 12.8 oz (117.4 kg)  07/16/22 257 lb 3.2 oz (116.7 kg)    Physical Exam Constitutional:      General: He is not in acute distress.    Appearance: He is not diaphoretic.  Cardiovascular:     Rate and Rhythm: Normal rate and regular rhythm.     Heart sounds: Normal heart sounds.  Pulmonary:     Effort: Pulmonary effort is normal.     Breath sounds: Normal breath sounds.  Musculoskeletal:     Right lower leg: No edema.     Left lower leg: No edema.  Skin:    General: Skin is warm and dry.  Neurological:     Mental Status: He is alert.      Assessment/Plan: Please see individual problem list.  Primary hypertension -     Basic metabolic panel -     Carvedilol; Take 1 tablet (6.25 mg total) by mouth 2 (two) times daily with a meal.  Dispense: 60 tablet; Refill: 2  Essential hypertension Assessment & Plan: Chronic issue.  At goal.  He  will continue HCTZ 12.5 mg daily and valsartan 320 mg daily.  Given his fatigue we are going to reduce his carvedilol to 6.25 mg twice daily and see if that makes a difference.  He will monitor his blood pressure and if it trends up with this dose reduction in carvedilol he will let us know.  Would consider increasing HCTZ if blood pressure trends up.  Follow-up in 1 month.   Tension headache Assessment & Plan: Resolved.    Other fatigue Assessment & Plan: Chronic issue. Patient today reports exertional fatigue.  Fatigue has been a chronic issue and lab workup has not revealed a specific cause.  Discussed that this could be related to deconditioning and obesity, his carvedilol, or a cardiac cause.  Discussed that I doubt his mildly low hemoglobin would be contributing to this.  We will try reducing carvedilol to 6.25 mg twice daily.  Given his description of the exertional component we will refer to cardiology to evaluate for an underlying cardiac cause.  Advised not to increase his activity level any until evaluated by cardiology.  Orders: -     Ambulatory referral to Cardiology  Anemia, unspecified type Assessment & Plan: Undetermined cause.  Mild.  Will check iron studies today.  Patient notes he self started an iron supplement about a month ago.  Orders: -     IBC + Ferritin    Return in about 1 month (around 10/11/2022).   Marikay Alar, MD [facility]

## 2022-09-10 NOTE — Patient Instructions (Signed)
Nice to see you. We are going to reduce your carvedilol to 6.25 mg twice daily.  Will see if this helps with your fatigue. Will get some lab work today and contact you with the results. Cardiology should contact you to set up a visit.  If you do not hear from them in the next 1 to 2 weeks please let us know.

## 2022-09-10 NOTE — Assessment & Plan Note (Signed)
Resolved

## 2022-09-10 NOTE — Assessment & Plan Note (Signed)
Chronic issue.  At goal.  He will continue HCTZ 12.5 mg daily and valsartan 320 mg daily.  Given his fatigue we are going to reduce his carvedilol to 6.25 mg twice daily and see if that makes a difference.  He will monitor his blood pressure and if it trends up with this dose reduction in carvedilol he will let us know.  Would consider increasing HCTZ if blood pressure trends up.  Follow-up in 1 month.

## 2022-09-13 DIAGNOSIS — M9901 Segmental and somatic dysfunction of cervical region: Secondary | ICD-10-CM | POA: Diagnosis not present

## 2022-09-13 DIAGNOSIS — M5033 Other cervical disc degeneration, cervicothoracic region: Secondary | ICD-10-CM | POA: Diagnosis not present

## 2022-09-13 DIAGNOSIS — M9903 Segmental and somatic dysfunction of lumbar region: Secondary | ICD-10-CM | POA: Diagnosis not present

## 2022-09-13 DIAGNOSIS — M5136 Other intervertebral disc degeneration, lumbar region: Secondary | ICD-10-CM | POA: Diagnosis not present

## 2022-09-14 ENCOUNTER — Ambulatory Visit: Payer: Medicare PPO | Admitting: Family Medicine

## 2022-09-24 DIAGNOSIS — D2261 Melanocytic nevi of right upper limb, including shoulder: Secondary | ICD-10-CM | POA: Diagnosis not present

## 2022-09-24 DIAGNOSIS — D2262 Melanocytic nevi of left upper limb, including shoulder: Secondary | ICD-10-CM | POA: Diagnosis not present

## 2022-09-24 DIAGNOSIS — D2271 Melanocytic nevi of right lower limb, including hip: Secondary | ICD-10-CM | POA: Diagnosis not present

## 2022-09-24 DIAGNOSIS — X32XXXA Exposure to sunlight, initial encounter: Secondary | ICD-10-CM | POA: Diagnosis not present

## 2022-09-24 DIAGNOSIS — L821 Other seborrheic keratosis: Secondary | ICD-10-CM | POA: Diagnosis not present

## 2022-09-24 DIAGNOSIS — L570 Actinic keratosis: Secondary | ICD-10-CM | POA: Diagnosis not present

## 2022-09-24 DIAGNOSIS — D225 Melanocytic nevi of trunk: Secondary | ICD-10-CM | POA: Diagnosis not present

## 2022-09-24 DIAGNOSIS — Z85828 Personal history of other malignant neoplasm of skin: Secondary | ICD-10-CM | POA: Diagnosis not present

## 2022-10-01 DIAGNOSIS — Z791 Long term (current) use of non-steroidal anti-inflammatories (NSAID): Secondary | ICD-10-CM | POA: Diagnosis not present

## 2022-10-01 DIAGNOSIS — R809 Proteinuria, unspecified: Secondary | ICD-10-CM | POA: Diagnosis not present

## 2022-10-01 DIAGNOSIS — N281 Cyst of kidney, acquired: Secondary | ICD-10-CM | POA: Diagnosis not present

## 2022-10-01 DIAGNOSIS — R3129 Other microscopic hematuria: Secondary | ICD-10-CM | POA: Diagnosis not present

## 2022-10-01 DIAGNOSIS — N2581 Secondary hyperparathyroidism of renal origin: Secondary | ICD-10-CM | POA: Diagnosis not present

## 2022-10-01 DIAGNOSIS — I129 Hypertensive chronic kidney disease with stage 1 through stage 4 chronic kidney disease, or unspecified chronic kidney disease: Secondary | ICD-10-CM | POA: Diagnosis not present

## 2022-10-04 DIAGNOSIS — M5136 Other intervertebral disc degeneration, lumbar region: Secondary | ICD-10-CM | POA: Diagnosis not present

## 2022-10-04 DIAGNOSIS — M9901 Segmental and somatic dysfunction of cervical region: Secondary | ICD-10-CM | POA: Diagnosis not present

## 2022-10-04 DIAGNOSIS — M9903 Segmental and somatic dysfunction of lumbar region: Secondary | ICD-10-CM | POA: Diagnosis not present

## 2022-10-04 DIAGNOSIS — M5033 Other cervical disc degeneration, cervicothoracic region: Secondary | ICD-10-CM | POA: Diagnosis not present

## 2022-10-09 ENCOUNTER — Telehealth: Payer: Self-pay | Admitting: Family Medicine

## 2022-10-09 DIAGNOSIS — D509 Iron deficiency anemia, unspecified: Secondary | ICD-10-CM

## 2022-10-09 NOTE — Telephone Encounter (Signed)
Patient need lab orders.

## 2022-10-10 ENCOUNTER — Ambulatory Visit: Payer: Medicare PPO | Admitting: Family Medicine

## 2022-10-10 VITALS — BP 128/78 | HR 77 | Temp 97.7°F | Ht 68.0 in | Wt 263.2 lb

## 2022-10-10 DIAGNOSIS — I10 Essential (primary) hypertension: Secondary | ICD-10-CM

## 2022-10-10 DIAGNOSIS — R5383 Other fatigue: Secondary | ICD-10-CM | POA: Diagnosis not present

## 2022-10-10 NOTE — Assessment & Plan Note (Signed)
Chronic issue.  Minimally improved with dose reduction of carvedilol.  We will have him see cardiology to evaluate for an underlying cardiac cause as this could be an anginal equivalent.  Advised not to increase his activity level until evaluated by cardiology and cleared to do so by them.

## 2022-10-10 NOTE — Progress Notes (Signed)
Marikay Alar, MD Phone: [phone]  [name] is a 82 y.o. male who presents today for f/u.  Hypertension: Patient notes blood pressures running little higher than what we got here.  He notes in the past his blood pressure cuff has been a little off compared to our readings.  Patient continues on valsartan, and reduced dose of carvedilol.  He notes the fatigue he was having is very minimally improved with the dose reduction of carvedilol.  No chest pain, shortness of breath, or edema.  He notes the fatigue happens after he has been active.  Notes this limits him in doing activities.  Patient reports he has an appointment with cardiology in September.  Social History   Tobacco Use  Smoking Status Never  Smokeless Tobacco Never    Current Outpatient Medications on File Prior to Visit  Medication Sig Dispense Refill   acetaminophen (TYLENOL) 500 MG tablet Take 500 mg by mouth every 6 (six) hours as needed.     aspirin 81 MG tablet Take 81 mg by mouth daily.     Bioflavonoid Products (PERIDIN-C PO) Take by mouth.     carvedilol (COREG) 6.25 MG tablet Take 1 tablet (6.25 mg total) by mouth 2 (two) times daily with a meal. 60 tablet 2   fluticasone (FLONASE) 50 MCG/ACT nasal spray Place into both nostrils daily.     MITIGARE 0.6 MG CAPS TAKE 0.6 MG BY MOUTH DAILY AS NEEDED (GOUT FLARE). 90 capsule 1   Multiple Vitamins-Minerals (CENTRUM SILVER 50+MEN PO) Take by mouth.     NON FORMULARY Clindamycin 150 mg     pantoprazole (PROTONIX) 40 MG tablet Take 1 tablet (40 mg total) by mouth 2 (two) times daily. 180 tablet 3   polyethylene glycol (MIRALAX / GLYCOLAX) 17 g packet Take 17 g by mouth daily.     tamsulosin (FLOMAX) 0.4 MG CAPS capsule TAKE 1 CAPSULE BY MOUTH EVERY DAY 90 capsule 3   valsartan (DIOVAN) 320 MG tablet TAKE 1 TABLET BY MOUTH EVERY DAY 90 tablet 1   Current Facility-Administered Medications on File Prior to Visit  Medication Dose Route Frequency Provider Last Rate  Last Admin   0.9 %  sodium chloride infusion  500 mL Intravenous Once Armbruster, Willaim Rayas, MD         ROS see history of present illness  Objective  Physical Exam Vitals:   10/10/22 1128  BP: 128/78  Pulse: 77  Temp: 97.7 F (36.5 C)  SpO2: 97%    BP Readings from Last 3 Encounters:  10/10/22 128/78  09/10/22 122/68  08/10/22 (!) 140/86   Wt Readings from Last 3 Encounters:  10/10/22 263 lb 3.2 oz (119.4 kg)  09/10/22 260 lb 3.2 oz (118 kg)  08/10/22 258 lb 12.8 oz (117.4 kg)    Physical Exam Constitutional:      General: He is not in acute distress.    Appearance: He is not diaphoretic.  Cardiovascular:     Rate and Rhythm: Normal rate and regular rhythm.     Heart sounds: Normal heart sounds.  Pulmonary:     Effort: Pulmonary effort is normal.     Breath sounds: Normal breath sounds.  Skin:    General: Skin is warm and dry.  Neurological:     Mental Status: He is alert.      Assessment/Plan: Please see individual problem list.  Essential hypertension Assessment & Plan: Chronic issue.  At goal.  He will continue valsartan 320 mg daily,  and carvedilol 6.25 mg twice daily.  Discussed some hesitant to discontinue his carvedilol until he sees cardiology given the benefit that the carvedilol may have if his fatigue is related to a cardiac issue.   Other fatigue Assessment & Plan: Chronic issue.  Minimally improved with dose reduction of carvedilol.  We will have him see cardiology to evaluate for an underlying cardiac cause as this could be an anginal equivalent.  Advised not to increase his activity level until evaluated by cardiology and cleared to do so by them.     Return in about 2 months (around 12/10/2022).   Marikay Alar, MD [facility]

## 2022-10-10 NOTE — Telephone Encounter (Signed)
Ordered

## 2022-10-10 NOTE — Assessment & Plan Note (Addendum)
Chronic issue.  At goal.  He will continue valsartan 320 mg daily, and carvedilol 6.25 mg twice daily.  Discussed some hesitant to discontinue his carvedilol until he sees cardiology given the benefit that the carvedilol may have if his fatigue is related to a cardiac issue.

## 2022-10-11 ENCOUNTER — Encounter: Payer: Self-pay | Admitting: Family Medicine

## 2022-10-11 DIAGNOSIS — N1832 Chronic kidney disease, stage 3b: Secondary | ICD-10-CM | POA: Diagnosis not present

## 2022-10-11 DIAGNOSIS — I10 Essential (primary) hypertension: Secondary | ICD-10-CM | POA: Diagnosis not present

## 2022-10-11 DIAGNOSIS — Z8546 Personal history of malignant neoplasm of prostate: Secondary | ICD-10-CM | POA: Diagnosis not present

## 2022-10-11 DIAGNOSIS — R3129 Other microscopic hematuria: Secondary | ICD-10-CM | POA: Diagnosis not present

## 2022-10-11 DIAGNOSIS — M1000 Idiopathic gout, unspecified site: Secondary | ICD-10-CM | POA: Diagnosis not present

## 2022-10-11 DIAGNOSIS — N200 Calculus of kidney: Secondary | ICD-10-CM | POA: Diagnosis not present

## 2022-10-11 DIAGNOSIS — R809 Proteinuria, unspecified: Secondary | ICD-10-CM | POA: Diagnosis not present

## 2022-10-11 DIAGNOSIS — N400 Enlarged prostate without lower urinary tract symptoms: Secondary | ICD-10-CM | POA: Diagnosis not present

## 2022-10-11 DIAGNOSIS — N35919 Unspecified urethral stricture, male, unspecified site: Secondary | ICD-10-CM | POA: Diagnosis not present

## 2022-10-15 ENCOUNTER — Other Ambulatory Visit (INDEPENDENT_AMBULATORY_CARE_PROVIDER_SITE_OTHER): Payer: Medicare PPO

## 2022-10-15 DIAGNOSIS — D509 Iron deficiency anemia, unspecified: Secondary | ICD-10-CM

## 2022-10-19 ENCOUNTER — Encounter: Payer: Self-pay | Admitting: Family Medicine

## 2022-10-22 ENCOUNTER — Ambulatory Visit: Payer: Medicare PPO | Attending: Cardiology | Admitting: Cardiology

## 2022-10-22 ENCOUNTER — Encounter: Payer: Self-pay | Admitting: Cardiology

## 2022-10-22 VITALS — BP 122/76 | HR 66 | Ht 68.0 in | Wt 262.0 lb

## 2022-10-22 DIAGNOSIS — H43813 Vitreous degeneration, bilateral: Secondary | ICD-10-CM | POA: Diagnosis not present

## 2022-10-22 DIAGNOSIS — R0683 Snoring: Secondary | ICD-10-CM

## 2022-10-22 DIAGNOSIS — I10 Essential (primary) hypertension: Secondary | ICD-10-CM

## 2022-10-22 DIAGNOSIS — R0602 Shortness of breath: Secondary | ICD-10-CM

## 2022-10-22 DIAGNOSIS — H35379 Puckering of macula, unspecified eye: Secondary | ICD-10-CM | POA: Diagnosis not present

## 2022-10-22 DIAGNOSIS — Z961 Presence of intraocular lens: Secondary | ICD-10-CM | POA: Diagnosis not present

## 2022-10-22 DIAGNOSIS — H35373 Puckering of macula, bilateral: Secondary | ICD-10-CM | POA: Diagnosis not present

## 2022-10-22 DIAGNOSIS — Z0100 Encounter for examination of eyes and vision without abnormal findings: Secondary | ICD-10-CM | POA: Diagnosis not present

## 2022-10-22 NOTE — Progress Notes (Signed)
Cardiology Office Note:    Date:  10/22/2022   ID:  [name], DOB [birth year], MRN [id number]  PCP:  Glori Luis, MD   [facility] Providers Cardiologist:  Debbe Odea, MD     Referring MD: Glori Luis, MD   Chief Complaint  Patient presents with   New Patient (Initial Visit)    Referred by PCP. Increased fatigue and SOB. Meds reviewed verbally with patient.     History of Present Illness:    [name] is a 82 y.o. male with a hx of hypertension, GERD, prostate cancer s/p radiation therapy who presents with fatigue and shortness of breath.  States having shortness of breath over the past year or so.  Also endorses daytime fatigue and somnolence.  Endorsed snoring.  Denies any history of heart disease.  Was previously on Norvasc, discussed swelling, this was switched to carvedilol about a year ago.  Blood pressures are usually well-controlled unless patient becomes anxious.  Past Medical History:  Diagnosis Date   Allergy    Anxiety    Arthritis    Cataract    beginning stage bilateral   Diverticulosis    Elevated PSA    Esophageal stricture    Esophagitis    GERD (gastroesophageal reflux disease)    Gout    History of kidney stones    Hyperlipidemia    Hypertension    Inguinal hernia    Internal hemorrhoids    Pancreatitis    Prostate cancer (HCC)    Tubular adenoma of colon     Past Surgical History:  Procedure Laterality Date   BACK SURGERY  1980's   L4/5   BALLOON DILATION N/A 11/17/2018   Procedure: URETHRAL BALLOON DILATION;  Surgeon: Vanna Scotland, MD;  Location: [facility];  Service: Urology;  Laterality: N/A;   CARPAL TUNNEL RELEASE Right 08/15/2016   Procedure: CARPAL TUNNEL RELEASE ENDOSCOPIC;  Surgeon: Christena Flake, MD;  Location: [facility];  Service: Orthopedics;  Laterality: Right;   CATARACT EXTRACTION W/PHACO Right 03/14/2021   Procedure: CATARACT EXTRACTION PHACO AND INTRAOCULAR LENS PLACEMENT (IOC)  RIGHT 6.84 00:52.3;  Surgeon: Galen Manila, MD;  Location: [facility];  Service: Ophthalmology;  Laterality: Right;   CATARACT EXTRACTION W/PHACO Left 03/28/2021   Procedure: CATARACT EXTRACTION PHACO AND INTRAOCULAR LENS PLACEMENT (IOC) LEFT;  Surgeon: Galen Manila, MD;  Location: [facility];  Service: Ophthalmology;  Laterality: Left;   COLONOSCOPY     CYSTOSCOPY N/A 11/17/2018   Procedure: CYSTOSCOPY;  Surgeon: Vanna Scotland, MD;  Location: [facility];  Service: Urology;  Laterality: N/A;   CYSTOSCOPY WITH LITHOLAPAXY N/A 10/09/2017   Procedure: CYSTOSCOPY WITH LITHOLAPAXY;  Surgeon: Vanna Scotland, MD;  Location: [facility];  Service: Urology;  Laterality: N/A;   ESOPHAGOGASTRODUODENOSCOPY  11-21-06   Esophagitis, Stricture/dilated    HOLEP-LASER ENUCLEATION OF THE PROSTATE WITH MORCELLATION N/A 10/09/2017   Procedure: HOLEP-LASER ENUCLEATION OF THE PROSTATE WITH MORCELLATION;  Surgeon: Vanna Scotland, MD;  Location: [facility];  Service: Urology;  Laterality: N/A;   LYMPH NODE BIOPSY  1957   benign  lymph node removed   Right arm fracture     TONSILLECTOMY     TOTAL KNEE ARTHROPLASTY  09-02-02   Bilateral   UPPER GASTROINTESTINAL ENDOSCOPY      Current Medications: Current Meds  Medication Sig   acetaminophen (TYLENOL) 500 MG tablet Take 500 mg by mouth every 6 (six) hours as needed.   aspirin 81 MG tablet Take  81 mg by mouth daily.   Bioflavonoid Products (PERIDIN-C PO) Take by mouth.   carvedilol (COREG) 6.25 MG tablet Take 1 tablet (6.25 mg total) by mouth 2 (two) times daily with a meal.   fluticasone (FLONASE) 50 MCG/ACT nasal spray Place into both nostrils daily.   MITIGARE 0.6 MG CAPS TAKE 0.6 MG BY MOUTH DAILY AS NEEDED (GOUT FLARE).   Multiple Vitamins-Minerals (CENTRUM SILVER 50+MEN PO) Take by mouth.   NON FORMULARY Clindamycin 150 mg   pantoprazole (PROTONIX) 40 MG tablet Take 1 tablet (40 mg total) by mouth 2 (two) times daily.   polyethylene  glycol (MIRALAX / GLYCOLAX) 17 g packet Take 17 g by mouth daily.   tamsulosin (FLOMAX) 0.4 MG CAPS capsule TAKE 1 CAPSULE BY MOUTH EVERY DAY   valsartan (DIOVAN) 320 MG tablet TAKE 1 TABLET BY MOUTH EVERY DAY   Current Facility-Administered Medications for the 10/22/22 encounter (Office Visit) with Debbe Odea, MD  Medication   0.9 %  sodium chloride infusion     Allergies:   Penicillins   Social History   Socioeconomic History   Marital status: Married    Spouse name: Not on file   Number of children: 1   Years of education: Not on file   Highest education level: Bachelor's degree (e.g., BA, AB, BS)  Occupational History   Occupation: Retired Designer, jewellery: [employer]    Comment: sales floors   Tobacco Use   Smoking status: Never   Smokeless tobacco: Never  Vaping Use   Vaping status: Never Used  Substance and Sexual Activity   Alcohol use: No   Drug use: No   Sexual activity: Yes  Other Topics Concern   Not on file  Social History Narrative   Lives in [place] with wife. From [place], [place]. 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 Daughter, [name], lives in [place]. No pets.      Exercise: regular   Diet: increased sweets   Social Determinants of Health   Financial Resource Strain: Low Risk  (06/07/2022)   Overall Financial Resource Strain (CARDIA)    Difficulty of Paying Living Expenses: Not hard at all  Food Insecurity: No Food Insecurity (06/07/2022)   Hunger Vital Sign    Worried About Running Out of Food in the Last Year: Never true    Ran Out of Food in the Last Year: Never true  Transportation Needs: No Transportation Needs (06/07/2022)   PRAPARE - Administrator, Civil Service (Medical): No    Lack of Transportation (Non-Medical): No  Physical Activity: Inactive (06/07/2022)   Exercise Vital Sign    Days of Exercise per Week: 0 days    Minutes of Exercise per Session: 30 min  Stress: No Stress Concern Present (06/07/2022)    [employer] of Occupational Health - Occupational Stress Questionnaire    Feeling of Stress : Not at all  Social Connections: Socially Integrated (06/07/2022)   Social Connection and Isolation Panel [NHANES]    Frequency of Communication with Friends and Family: More than three times a week    Frequency of Social Gatherings with Friends and Family: More than three times a week    Attends Religious Services: More than 4 times per year    Active Member of [employer] or Organizations: Yes    Attends Engineer, structural: More than 4 times per year    Marital Status: Married     Family History: The patient's family  history includes Arthritis in his father and mother; Cancer in his brother; Depression in his mother; Diverticulitis in his father; Hypertension in his brother; Obesity in his brother; Ulcers in his father. There is no history of Colon cancer, Colon polyps, Esophageal cancer, Rectal cancer, Stomach cancer, Kidney disease, Kidney cancer, or Prostate cancer.  ROS:   Please see the history of present illness.     All other systems reviewed and are negative.  EKGs/Labs/Other Studies Reviewed:    The following studies were reviewed today:  EKG Interpretation Date/Time:  Monday October 22 2022 14:07:36 EDT Ventricular Rate:  66 PR Interval:  156 QRS Duration:  74 QT Interval:  418 QTC Calculation: 438 R Axis:   -41  Text Interpretation: Normal sinus rhythm with sinus arrhythmia Left axis deviation Confirmed by Debbe Odea ([id number]) on 10/22/2022 2:39:48 PM    Recent Labs: 08/10/2022: ALT 21; TSH 2.56 09/10/2022: BUN 25; Creatinine, Ser 1.54; Potassium 4.8; Sodium 141 10/15/2022: Hemoglobin 12.6; Platelets 217.0  Recent Lipid Panel    Component Value Date/Time   CHOL 177 03/16/2022 1042   CHOL 118 08/31/2013 0401   TRIG 160.0 (H) 03/16/2022 1042   TRIG 59 08/31/2013 0401   HDL 36.20 (L) 03/16/2022 1042   HDL 33 (L) 08/31/2013 0401   CHOLHDL 5 03/16/2022 1042    VLDL 32.0 03/16/2022 1042   VLDL 12 08/31/2013 0401   LDLCALC 109 (H) 03/16/2022 1042   LDLCALC 73 08/31/2013 0401   LDLDIRECT 104.5 05/07/2011 1415     Risk Assessment/Calculations:             Physical Exam:    VS:  BP 122/76 (BP Location: Left Arm, Patient Position: Sitting, Cuff Size: Normal)   Pulse 66   Ht 5\' 8"  (1.727 m)   Wt 262 lb (118.8 kg)   BMI 39.84 kg/m     Wt Readings from Last 3 Encounters:  10/22/22 262 lb (118.8 kg)  10/10/22 263 lb 3.2 oz (119.4 kg)  09/10/22 260 lb 3.2 oz (118 kg)     GEN:  Well nourished, well developed in no acute distress HEENT: Normal NECK: No JVD; No carotid bruits CARDIAC: RRR, no murmurs, rubs, gallops RESPIRATORY:  Clear to auscultation without rales, wheezing or rhonchi  ABDOMEN: Soft, non-tender, non-distended MUSCULOSKELETAL:  No edema; No deformity  SKIN: Warm and dry NEUROLOGIC:  Alert and oriented x 3 PSYCHIATRIC:  Normal affect   ASSESSMENT:    1. SOB (shortness of breath)   2. Snoring   3. Primary hypertension    PLAN:    In order of problems listed above:  Shortness of breath, this could be due to deconditioning or OSA related if diagnosed.  Denies chest pain.  Get echo to rule out any significant structural abnormalities. Snoring, daytime fatigue, somnolence.  Refer to sleep specialist for OSA eval. Hypertension, BP controlled.  Continue valsartan 320 mg daily, Coreg 6.25 mg twice daily.  Follow-up after echocardiogram.      Medication Adjustments/Labs and Tests Ordered: Current medicines are reviewed at length with the patient today.  Concerns regarding medicines are outlined above.  Orders Placed This Encounter  Procedures   Ambulatory referral to Pulmonology   EKG 12-Lead   ECHOCARDIOGRAM COMPLETE   No orders of the defined types were placed in this encounter.   Patient Instructions  Medication Instructions:   Your physician recommends that you continue on your current medications as  directed. Please refer to the Current Medication list given to you today.  *  If you need a refill on your cardiac medications before your next appointment, please call your pharmacy*   Lab Work:  None Ordered  If you have labs (blood work) drawn today and your tests are completely normal, you will receive your results only by: MyChart Message (if you have MyChart) OR A paper copy in the mail If you have any lab test that is abnormal or we need to change your treatment, we will call you to review the results.   Testing/Procedures:  Your physician has requested that you have an echocardiogram. Echocardiography is a painless test that uses sound waves to create images of your heart. It provides your doctor with information about the size and shape of your heart and how well your heart's chambers and valves are working. This procedure takes approximately one hour. There are no restrictions for this procedure. Please do NOT wear cologne, perfume, aftershave, or lotions (deodorant is allowed). Please arrive 15 minutes prior to your appointment time.     Follow-Up: At [facility], you and your health needs are our priority.  As part of our continuing mission to provide you with exceptional heart care, we have created designated Provider Care Teams.  These Care Teams include your primary Cardiologist (physician) and Advanced Practice Providers (APPs -  Physician Assistants and Nurse Practitioners) who all work together to provide you with the care you need, when you need it.  We recommend signing up for the patient portal called "MyChart".  Sign up information is provided on this After Visit Summary.  MyChart is used to connect with patients for Virtual Visits (Telemedicine).  Patients are able to view lab/test results, encounter notes, upcoming appointments, etc.  Non-urgent messages can be sent to your provider as well.   To learn more about what you can do with MyChart, go to  [email or web address].    Your next appointment:    After Echocardiogram  Provider:   You may see Debbe Odea, MD or one of the following Advanced Practice Providers on your designated Care Team:   Nicolasa Ducking, NP Eula Listen, PA-C Cadence Fransico Michael, PA-C Charlsie Quest, NP   Signed, Debbe Odea, MD  10/22/2022 3:03 PM    [facility]

## 2022-10-22 NOTE — Patient Instructions (Signed)
Medication Instructions:   Your physician recommends that you continue on your current medications as directed. Please refer to the Current Medication list given to you today.  *If you need a refill on your cardiac medications before your next appointment, please call your pharmacy*   Lab Work:  None Ordered  If you have labs (blood work) drawn today and your tests are completely normal, you will receive your results only by: MyChart Message (if you have MyChart) OR A paper copy in the mail If you have any lab test that is abnormal or we need to change your treatment, we will call you to review the results.   Testing/Procedures:  Your physician has requested that you have an echocardiogram. Echocardiography is a painless test that uses sound waves to create images of your heart. It provides your doctor with information about the size and shape of your heart and how well your heart's chambers and valves are working. This procedure takes approximately one hour. There are no restrictions for this procedure. Please do NOT wear cologne, perfume, aftershave, or lotions (deodorant is allowed). Please arrive 15 minutes prior to your appointment time.    Follow-Up: At [facility], you and your health needs are our priority.  As part of our continuing mission to provide you with exceptional heart care, we have created designated Provider Care Teams.  These Care Teams include your primary Cardiologist (physician) and Advanced Practice Providers (APPs -  Physician Assistants and Nurse Practitioners) who all work together to provide you with the care you need, when you need it.  We recommend signing up for the patient portal called "MyChart".  Sign up information is provided on this After Visit Summary.  MyChart is used to connect with patients for Virtual Visits (Telemedicine).  Patients are able to view lab/test results, encounter notes, upcoming appointments, etc.  Non-urgent messages can  be sent to your provider as well.   To learn more about what you can do with MyChart, go to [email or web address].    Your next appointment:    After Echocardiogram  Provider:   You may see Brian Agbor-Etang, MD or one of the following Advanced Practice Providers on your designated Care Team:   Christopher Berge, NP Ryan Dunn, PA-C Cadence Furth, PA-C Sheri Hammock, NP 

## 2022-10-26 ENCOUNTER — Ambulatory Visit (INDEPENDENT_AMBULATORY_CARE_PROVIDER_SITE_OTHER): Payer: Medicare PPO | Admitting: *Deleted

## 2022-10-26 VITALS — Ht 68.0 in | Wt 264.0 lb

## 2022-10-26 DIAGNOSIS — Z0000 Encounter for general adult medical examination without abnormal findings: Secondary | ICD-10-CM

## 2022-10-26 NOTE — Patient Instructions (Signed)
[name] , Thank you for taking time to come for your Medicare Wellness Visit. I appreciate your ongoing commitment to your health goals. Please review the following plan we discussed and let me know if I can assist you in the future.   Referrals/Orders/Follow-Ups/Clinician Recommendations: None  This is a list of the screening recommended for you and due dates:  Health Maintenance  Topic Date Due   COVID-19 Vaccine (6 - 2023-24 season) 04/06/2022   Flu Shot  10/04/2022   Medicare Annual Wellness Visit  10/26/2023   DTaP/Tdap/Td vaccine (3 - Td or Tdap) 08/12/2024   Pneumonia Vaccine  Completed   Zoster (Shingles) Vaccine  Completed   HPV Vaccine  Aged Out    Advanced directives: (Declined) Advance directive discussed with you today. Even though you declined this today, please call our office should you change your mind, and we can give you the proper paperwork for you to fill out. Will pick up at next visit and complete  Next Medicare Annual Wellness Visit scheduled for next year: Yes 10/30/23 @ 9:00

## 2022-10-26 NOTE — Progress Notes (Signed)
Subjective:   [name] is a 82 y.o. male who presents for Medicare Annual/Subsequent preventive examination.  Visit Complete: Virtual  I connected with  [name] on 10/26/22 by a audio enabled telemedicine application and verified that I am speaking with the correct person using two identifiers.  Patient Location: Home  Provider Location: Home Office  I discussed the limitations of evaluation and management by telemedicine. The patient expressed understanding and agreed to proceed.  Vital Signs: Unable to obtain new vitals due to this being a telehealth visit.   Review of Systems     Cardiac Risk Factors include: advanced age (>11men, >70 women);obesity (BMI >30kg/m2);male gender;dyslipidemia;hypertension;Other (see comment), Risk factor comments: Irregular     Objective:    Today's Vitals   10/26/22 0858  Weight: 264 lb (119.7 kg)  Height: 5\' 8"  (1.727 m)   Body mass index is 40.14 kg/m.     10/26/2022    9:17 AM 06/27/2022   10:11 AM 10/24/2021    2:02 PM 09/01/2021   11:50 AM 06/05/2021    3:04 AM 06/03/2021    1:01 PM 05/18/2021   10:58 AM  Advanced Directives  Does Patient Have a Medical Advance Directive? No No No No No No No  Would patient like information on creating a medical advance directive? No - Patient declined No - Patient declined No - Patient declined  No - Patient declined No - Patient declined No - Patient declined    Current Medications (verified) Outpatient Encounter Medications as of 10/26/2022  Medication Sig   acetaminophen (TYLENOL) 500 MG tablet Take 500 mg by mouth every 6 (six) hours as needed.   aspirin 81 MG tablet Take 81 mg by mouth daily.   Bioflavonoid Products (PERIDIN-C PO) Take by mouth.   carvedilol (COREG) 6.25 MG tablet Take 1 tablet (6.25 mg total) by mouth 2 (two) times daily with a meal.   fluticasone (FLONASE) 50 MCG/ACT nasal spray Place into both nostrils daily.   MITIGARE 0.6 MG CAPS TAKE 0.6 MG BY MOUTH DAILY AS  NEEDED (GOUT FLARE).   Multiple Vitamins-Minerals (CENTRUM SILVER 50+MEN PO) Take by mouth.   NON FORMULARY Clindamycin 150 mg   pantoprazole (PROTONIX) 40 MG tablet Take 1 tablet (40 mg total) by mouth 2 (two) times daily.   polyethylene glycol (MIRALAX / GLYCOLAX) 17 g packet Take 17 g by mouth daily.   tamsulosin (FLOMAX) 0.4 MG CAPS capsule TAKE 1 CAPSULE BY MOUTH EVERY DAY   valsartan (DIOVAN) 320 MG tablet TAKE 1 TABLET BY MOUTH EVERY DAY   Facility-Administered Encounter Medications as of 10/26/2022  Medication   0.9 %  sodium chloride infusion    Allergies (verified) Penicillins   History: Past Medical History:  Diagnosis Date   Allergy    Anxiety    Arthritis    Cataract    beginning stage bilateral   Diverticulosis    Elevated PSA    Esophageal stricture    Esophagitis    GERD (gastroesophageal reflux disease)    Gout    History of kidney stones    Hyperlipidemia    Hypertension    Inguinal hernia    Internal hemorrhoids    Pancreatitis    Prostate cancer (HCC)    Tubular adenoma of colon    Past Surgical History:  Procedure Laterality Date   BACK SURGERY  1980's   L4/5   BALLOON DILATION N/A 11/17/2018   Procedure: URETHRAL BALLOON DILATION;  Surgeon: Vanna Scotland,  MD;  Location: [facility];  Service: Urology;  Laterality: N/A;   CARPAL TUNNEL RELEASE Right 08/15/2016   Procedure: CARPAL TUNNEL RELEASE ENDOSCOPIC;  Surgeon: Christena Flake, MD;  Location: [facility];  Service: Orthopedics;  Laterality: Right;   CATARACT EXTRACTION W/PHACO Right 03/14/2021   Procedure: CATARACT EXTRACTION PHACO AND INTRAOCULAR LENS PLACEMENT (IOC) RIGHT 6.84 00:52.3;  Surgeon: Galen Manila, MD;  Location: [facility];  Service: Ophthalmology;  Laterality: Right;   CATARACT EXTRACTION W/PHACO Left 03/28/2021   Procedure: CATARACT EXTRACTION PHACO AND INTRAOCULAR LENS PLACEMENT (IOC) LEFT;  Surgeon: Galen Manila, MD;  Location: [facility];   Service: Ophthalmology;  Laterality: Left;   COLONOSCOPY     CYSTOSCOPY N/A 11/17/2018   Procedure: CYSTOSCOPY;  Surgeon: Vanna Scotland, MD;  Location: [facility];  Service: Urology;  Laterality: N/A;   CYSTOSCOPY WITH LITHOLAPAXY N/A 10/09/2017   Procedure: CYSTOSCOPY WITH LITHOLAPAXY;  Surgeon: Vanna Scotland, MD;  Location: [facility];  Service: Urology;  Laterality: N/A;   ESOPHAGOGASTRODUODENOSCOPY  11-21-06   Esophagitis, Stricture/dilated    HOLEP-LASER ENUCLEATION OF THE PROSTATE WITH MORCELLATION N/A 10/09/2017   Procedure: HOLEP-LASER ENUCLEATION OF THE PROSTATE WITH MORCELLATION;  Surgeon: Vanna Scotland, MD;  Location: [facility];  Service: Urology;  Laterality: N/A;   LYMPH NODE BIOPSY  1957   benign  lymph node removed   Right arm fracture     TONSILLECTOMY     TOTAL KNEE ARTHROPLASTY  09-02-02   Bilateral   UPPER GASTROINTESTINAL ENDOSCOPY     Family History  Problem Relation Age of Onset   Ulcers Father    Diverticulitis Father    Arthritis Father    Hypertension Brother    Obesity Brother    Cancer Brother        throat   Arthritis Mother    Depression Mother        Anxiety   Colon cancer Neg Hx    Colon polyps Neg Hx    Esophageal cancer Neg Hx    Rectal cancer Neg Hx    Stomach cancer Neg Hx    Kidney disease Neg Hx    Kidney cancer Neg Hx    Prostate cancer Neg Hx    Social History   Socioeconomic History   Marital status: Married    Spouse name: Not on file   Number of children: 1   Years of education: Not on file   Highest education level: Bachelor's degree (e.g., BA, AB, BS)  Occupational History   Occupation: Retired Designer, jewellery: [employer]    Comment: sales floors   Tobacco Use   Smoking status: Never   Smokeless tobacco: Never  Vaping Use   Vaping status: Never Used  Substance and Sexual Activity   Alcohol use: No   Drug use: No   Sexual activity: Yes  Other Topics Concern   Not on file  Social History Narrative   Lives in  [place] with wife. From [place], [place]. 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 Daughter, [name], lives in [place]. No pets.      Exercise: regular   Diet: increased sweets   Social Determinants of Health   Financial Resource Strain: Low Risk  (10/26/2022)   Overall Financial Resource Strain (CARDIA)    Difficulty of Paying Living Expenses: Not hard at all  Food Insecurity: No Food Insecurity (10/26/2022)   Hunger Vital Sign    Worried About Running Out of Food in the Last  Year: Never true    Ran Out of Food in the Last Year: Never true  Transportation Needs: No Transportation Needs (10/26/2022)   PRAPARE - Administrator, Civil Service (Medical): No    Lack of Transportation (Non-Medical): No  Physical Activity: Inactive (10/26/2022)   Exercise Vital Sign    Days of Exercise per Week: 0 days    Minutes of Exercise per Session: 0 min  Stress: No Stress Concern Present (10/26/2022)   [employer] of Occupational Health - Occupational Stress Questionnaire    Feeling of Stress : Only a little  Social Connections: Socially Integrated (10/26/2022)   Social Connection and Isolation Panel [NHANES]    Frequency of Communication with Friends and Family: More than three times a week    Frequency of Social Gatherings with Friends and Family: More than three times a week    Attends Religious Services: More than 4 times per year    Active Member of [employer] or Organizations: Yes    Attends Engineer, structural: More than 4 times per year    Marital Status: Married    Tobacco Counseling Counseling given: Not Answered   Clinical Intake:  Pre-visit preparation completed: Yes  Pain : No/denies pain     BMI - recorded: 40.14 Nutritional Status: BMI > 30  Obese Nutritional Risks: None Diabetes: No  How often do you need to have someone help you when you read instructions, pamphlets, or other written materials from your doctor or pharmacy?: 1 -  Never  Interpreter Needed?: No  Information entered by :: R. Jeannelle Wiens LPN   Activities of Daily Living    10/26/2022    9:03 AM  In your present state of health, do you have any difficulty performing the following activities:  Hearing? 0  Vision? 0  Comment glasses  Difficulty concentrating or making decisions? 0  Walking or climbing stairs? 1  Comment uses a cane  Dressing or bathing? 0  Doing errands, shopping? 0  Preparing Food and eating ? N  Using the Toilet? N  In the past six months, have you accidently leaked urine? Y  Comment prostate cancer  Do you have problems with loss of bowel control? N  Managing your Medications? N  Managing your Finances? N  Housekeeping or managing your Housekeeping? N    Patient Care Team: Glori Luis, MD as PCP - General (Family Medicine) Debbe Odea, MD as PCP - Cardiology (Cardiology) Dasher, Cliffton Asters, MD (Dermatology) Galen Manila, MD as Referring Physician (Ophthalmology) Ollen Gross, MD (Orthopedic Surgery)  Indicate any recent Medical Services you may have received from other than [facility] providers in the past year (date may be approximate).     Assessment:   This is a routine wellness examination for [facility]  Hearing/Vision screen Hearing Screening - Comments:: No issues Vision Screening - Comments:: glasses  Dietary issues and exercise activities discussed:     Goals Addressed             This Visit's Progress    Patient Stated       Wants to exercise once he gets the green light from him doctor       Depression Screen    10/26/2022    9:12 AM 09/10/2022    9:40 AM 08/10/2022   10:05 AM 07/16/2022    9:27 AM 06/11/2022    4:44 PM 04/16/2022    9:24 AM 02/01/2022   11:14 AM  PHQ 2/9 Scores  PHQ -  2 Score 0 0 0 0 0 0 0  PHQ- 9 Score 3 0 3 0 0 3     Fall Risk    10/26/2022    9:06 AM 09/10/2022    9:40 AM 08/10/2022   10:05 AM 07/16/2022    9:27 AM 06/11/2022    4:44 PM  Fall Risk   Falls in the  past year? 0 0 0 0 0  Number falls in past yr: 0 0 0 0 0  Injury with Fall? 0 0 0 0 0  Risk for fall due to : No Fall Risks No Fall Risks No Fall Risks No Fall Risks No Fall Risks  Follow up Falls prevention discussed;Falls evaluation completed Falls evaluation completed Falls evaluation completed Falls evaluation completed Falls evaluation completed    MEDICARE RISK AT HOME: Medicare Risk at Home Any stairs in or around the home?: Yes If so, are there any without handrails?: No Home free of loose throw rugs in walkways, pet beds, electrical cords, etc?: Yes Adequate lighting in your home to reduce risk of falls?: Yes Life alert?: No Use of a cane, Senner or w/c?: Yes Grab bars in the bathroom?: Yes Shower chair or bench in shower?: No Elevated toilet seat or a handicapped toilet?: No   Cognitive Function:    10/13/2019   10:53 AM 10/07/2017   11:18 AM 10/15/2016   10:27 AM 10/12/2015    2:28 PM  MMSE - Mini Mental State Exam  Not completed: Unable to complete     Orientation to time  5 5 5   Orientation to Place  5 5 5   Registration  3 3 3   Attention/ Calculation  5 5 5   Recall  3 3 3   Language- name 2 objects  2 2 2   Language- repeat  1 1 1   Language- follow 3 step command  3 3 3   Language- read & follow direction  1 1 1   Write a sentence  1 1 1   Copy design  1 1 1   Total score  30 30 30         10/26/2022    9:18 AM 10/24/2021    2:48 PM 10/10/2018   10:47 AM  6CIT Screen  What Year? 0 points 0 points 0 points  What month? 0 points 0 points 0 points  What time? 0 points  0 points  Count back from 20 0 points  0 points  Months in reverse 0 points 0 points 0 points  Repeat phrase 0 points  0 points  Total Score 0 points  0 points    Immunizations Immunization History  Administered Date(s) Administered   Covid-19, Mrna,Vaccine(Spikevax)73yrs and older 02/09/2022   Fluad Quad(high Dose 65+) 11/26/2018, 01/09/2019, 11/23/2020, 11/20/2021   Influenza Whole 01/03/2006,  12/24/2011   Influenza, High Dose Seasonal PF 10/20/2016, 11/03/2017, 12/03/2017, 11/26/2019   Influenza,inj,Quad PF,6+ Mos 12/15/2012, 10/28/2014   Influenza-Unspecified 12/25/2013, 11/08/2015, 10/20/2016   PFIZER(Purple Top)SARS-COV-2 Vaccination 03/26/2019, 04/16/2019, 12/07/2019, 07/01/2020   Pneumococcal Conjugate-13 06/30/2013   Pneumococcal Polysaccharide-23 06/21/2006   Respiratory Syncytial Virus Vaccine,Recomb Aduvanted(Arexvy) 11/29/2021   Td 08/19/2002   Tdap 08/13/2014   Zoster Recombinant(Shingrix) 10/20/2018, 01/09/2019   Zoster, Live 12/28/2009    TDAP status: Up to date  Flu Vaccine status: Up to date  Pneumococcal vaccine status: Up to date  Covid-19 vaccine status: Completed vaccines  Qualifies for Shingles Vaccine? Yes   Zostavax completed Yes   Shingrix Completed?: Yes  Screening Tests Health Maintenance  Topic  Date Due   COVID-19 Vaccine (6 - 2023-24 season) 04/06/2022   Medicare Annual Wellness (AWV)  10/25/2022   INFLUENZA VACCINE  10/04/2022   DTaP/Tdap/Td (3 - Td or Tdap) 08/12/2024   Pneumonia Vaccine 80+ Years old  Completed   Zoster Vaccines- Shingrix  Completed   HPV VACCINES  Aged Out    Health Maintenance  Health Maintenance Due  Topic Date Due   COVID-19 Vaccine (6 - 2023-24 season) 04/06/2022   Medicare Annual Wellness (AWV)  10/25/2022   INFLUENZA VACCINE  10/04/2022    Colorectal cancer screening: No longer required.   Lung Cancer Screening: (Low Dose CT Chest recommended if Age 19-80 years, 20 pack-year currently smoking OR have quit w/in 15years.) does not qualify.    Additional Screening:  Hepatitis C Screening: does not qualify; Completed NA shr  Vision Screening: Recommended annual ophthalmology exams for early detection of glaucoma and other disorders of the eye. Is the patient up to date with their annual eye exam?  Yes  Who is the provider or what is the name of the office in which the patient attends annual eye  exams? [facility] If pt is not established with a provider, would they like to be referred to a provider to establish care? No .   Dental Screening: Recommended annual dental exams for proper oral hygiene   Community Resource Referral / Chronic Care Management: CRR required this visit?  No   CCM required this visit?  No     Plan:     I have personally reviewed and noted the following in the patient's chart:   Medical and social history Use of alcohol, tobacco or illicit drugs  Current medications and supplements including opioid prescriptions. Patient is not currently taking opioid prescriptions. Functional ability and status Nutritional status Physical activity Advanced directives List of other physicians Hospitalizations, surgeries, and ER visits in previous 12 months Vitals Screenings to include cognitive, depression, and falls Referrals and appointments  In addition, I have reviewed and discussed with patient certain preventive protocols, quality metrics, and best practice recommendations. A written personalized care plan for preventive services as well as general preventive health recommendations were provided to patient.     Sydell Axon, LPN   11/17/7827   After Visit Summary: (MyChart) Due to this being a telephonic visit, the after visit summary with patients personalized plan was offered to patient via MyChart   Nurse Notes: None

## 2022-11-04 ENCOUNTER — Other Ambulatory Visit: Payer: Self-pay | Admitting: Family Medicine

## 2022-11-04 DIAGNOSIS — I10 Essential (primary) hypertension: Secondary | ICD-10-CM

## 2022-11-19 ENCOUNTER — Ambulatory Visit: Payer: Medicare PPO | Admitting: Cardiology

## 2022-11-20 ENCOUNTER — Other Ambulatory Visit: Payer: Self-pay | Admitting: Family Medicine

## 2022-11-20 DIAGNOSIS — M4306 Spondylolysis, lumbar region: Secondary | ICD-10-CM | POA: Diagnosis not present

## 2022-11-20 DIAGNOSIS — M9902 Segmental and somatic dysfunction of thoracic region: Secondary | ICD-10-CM | POA: Diagnosis not present

## 2022-11-20 DIAGNOSIS — M9901 Segmental and somatic dysfunction of cervical region: Secondary | ICD-10-CM | POA: Diagnosis not present

## 2022-11-20 DIAGNOSIS — I10 Essential (primary) hypertension: Secondary | ICD-10-CM

## 2022-11-20 DIAGNOSIS — M9905 Segmental and somatic dysfunction of pelvic region: Secondary | ICD-10-CM | POA: Diagnosis not present

## 2022-11-20 DIAGNOSIS — M531 Cervicobrachial syndrome: Secondary | ICD-10-CM | POA: Diagnosis not present

## 2022-11-20 DIAGNOSIS — M4302 Spondylolysis, cervical region: Secondary | ICD-10-CM | POA: Diagnosis not present

## 2022-11-20 DIAGNOSIS — M9903 Segmental and somatic dysfunction of lumbar region: Secondary | ICD-10-CM | POA: Diagnosis not present

## 2022-11-20 DIAGNOSIS — M9904 Segmental and somatic dysfunction of sacral region: Secondary | ICD-10-CM | POA: Diagnosis not present

## 2022-11-21 DIAGNOSIS — M4302 Spondylolysis, cervical region: Secondary | ICD-10-CM | POA: Diagnosis not present

## 2022-11-21 DIAGNOSIS — M9901 Segmental and somatic dysfunction of cervical region: Secondary | ICD-10-CM | POA: Diagnosis not present

## 2022-11-21 DIAGNOSIS — M4306 Spondylolysis, lumbar region: Secondary | ICD-10-CM | POA: Diagnosis not present

## 2022-11-21 DIAGNOSIS — M9905 Segmental and somatic dysfunction of pelvic region: Secondary | ICD-10-CM | POA: Diagnosis not present

## 2022-11-21 DIAGNOSIS — M9902 Segmental and somatic dysfunction of thoracic region: Secondary | ICD-10-CM | POA: Diagnosis not present

## 2022-11-21 DIAGNOSIS — M9904 Segmental and somatic dysfunction of sacral region: Secondary | ICD-10-CM | POA: Diagnosis not present

## 2022-11-21 DIAGNOSIS — M9903 Segmental and somatic dysfunction of lumbar region: Secondary | ICD-10-CM | POA: Diagnosis not present

## 2022-11-21 DIAGNOSIS — M531 Cervicobrachial syndrome: Secondary | ICD-10-CM | POA: Diagnosis not present

## 2022-11-27 ENCOUNTER — Other Ambulatory Visit: Payer: Medicare PPO

## 2022-11-27 ENCOUNTER — Encounter: Payer: Self-pay | Admitting: Adult Health

## 2022-11-27 ENCOUNTER — Ambulatory Visit (INDEPENDENT_AMBULATORY_CARE_PROVIDER_SITE_OTHER): Payer: Medicare PPO | Admitting: Adult Health

## 2022-11-27 VITALS — BP 140/84 | HR 75 | Ht 68.0 in | Wt 264.2 lb

## 2022-11-27 DIAGNOSIS — I10 Essential (primary) hypertension: Secondary | ICD-10-CM

## 2022-11-27 DIAGNOSIS — Z23 Encounter for immunization: Secondary | ICD-10-CM

## 2022-11-27 DIAGNOSIS — R0683 Snoring: Secondary | ICD-10-CM

## 2022-11-27 DIAGNOSIS — N1832 Chronic kidney disease, stage 3b: Secondary | ICD-10-CM

## 2022-11-27 NOTE — Progress Notes (Signed)
@Patient  ID: [name], male    DOB: October 21, 1940, 82 y.o.   MRN: [id number]  Chief Complaint  Patient presents with   Consult    Referring provider: Glori Luis, MD  HPI: 82 year old male seen for sleep consult November 27, 2022 for loud snoring, restless sleep, daytime sleepiness Medical history significant for hypertension and chronic kidney disease   TEST/EVENTS :   11/27/2022 Sleep consult  Patient presents for a sleep consult today.  Kindly referred by Dr. Birdie Sons.  Patient complains of loud snoring, restless sleep, daytime sleepiness.  Spouse says that he snores very loudly and has episodes where he stops breathing.  This has been going on for a long time.  Patient says he always feels tired no matter how much he sleeps.  Patient does have a history of prostate cancer status post resection.  Has chronic nocturia gets up 3 or 4 times each night.  Typically goes to sleep on the couch about 930 and then gets up a few hours later and goes to bed.  Only takes a couple minutes to go to sleep.  Gets up at about 7 AM.  Weight is up 10 pounds.  Current weight is at 264 pounds with a BMI of 40.  He has no removable dental work.  Patient says he naps usually once a day for about an hour.  Drinks about 2 sodas daily.  Has no history of congestive heart failure or stroke.  Does not use any sleep aids.  Epworth score is 13 out of 24.  Typically gets sleepy if he sits down to watch TV, in the evening hours, passenger and after lunch.  Has no symptoms suspicious for cataplexy or sleep paralysis.  Social history patient is married.  Has adult children.  Is retired from the [employer].  Is a never smoker.  No alcohol or drug use.  Family history positive for cancer  Past Surgical History:  Procedure Laterality Date   BACK SURGERY  1980's   L4/5   BALLOON DILATION N/A 11/17/2018   Procedure: URETHRAL BALLOON DILATION;  Surgeon: Vanna Scotland, MD;  Location: [facility];  Service:  Urology;  Laterality: N/A;   CARPAL TUNNEL RELEASE Right 08/15/2016   Procedure: CARPAL TUNNEL RELEASE ENDOSCOPIC;  Surgeon: Christena Flake, MD;  Location: [facility];  Service: Orthopedics;  Laterality: Right;   CATARACT EXTRACTION W/PHACO Right 03/14/2021   Procedure: CATARACT EXTRACTION PHACO AND INTRAOCULAR LENS PLACEMENT (IOC) RIGHT 6.84 00:52.3;  Surgeon: Galen Manila, MD;  Location: [facility];  Service: Ophthalmology;  Laterality: Right;   CATARACT EXTRACTION W/PHACO Left 03/28/2021   Procedure: CATARACT EXTRACTION PHACO AND INTRAOCULAR LENS PLACEMENT (IOC) LEFT;  Surgeon: Galen Manila, MD;  Location: [facility];  Service: Ophthalmology;  Laterality: Left;   COLONOSCOPY     CYSTOSCOPY N/A 11/17/2018   Procedure: CYSTOSCOPY;  Surgeon: Vanna Scotland, MD;  Location: [facility];  Service: Urology;  Laterality: N/A;   CYSTOSCOPY WITH LITHOLAPAXY N/A 10/09/2017   Procedure: CYSTOSCOPY WITH LITHOLAPAXY;  Surgeon: Vanna Scotland, MD;  Location: [facility];  Service: Urology;  Laterality: N/A;   ESOPHAGOGASTRODUODENOSCOPY  11-21-06   Esophagitis, Stricture/dilated    HOLEP-LASER ENUCLEATION OF THE PROSTATE WITH MORCELLATION N/A 10/09/2017   Procedure: HOLEP-LASER ENUCLEATION OF THE PROSTATE WITH MORCELLATION;  Surgeon: Vanna Scotland, MD;  Location: [facility];  Service: Urology;  Laterality: N/A;   LYMPH NODE BIOPSY  1957   benign  lymph node removed   Right arm fracture  TONSILLECTOMY     TOTAL KNEE ARTHROPLASTY  09-02-02   Bilateral   UPPER GASTROINTESTINAL ENDOSCOPY       Allergies  Allergen Reactions   Penicillins Rash    Did it involve swelling of the face/tongue/throat, SOB, or low BP? No Did it involve sudden or severe rash/hives, skin peeling, or any reaction on the inside of your mouth or nose? No Did you need to seek medical attention at a hospital or doctor's office? No When did it last happen?      50 Years If all above answers are "NO", may  proceed with cephalosporin use.     Immunization History  Administered Date(s) Administered   Fluad Quad(high Dose 65+) 11/26/2018, 01/09/2019, 11/23/2020, 11/20/2021   Influenza Whole 01/03/2006, 12/24/2011   Influenza, High Dose Seasonal PF 10/20/2016, 11/03/2017, 12/03/2017, 11/26/2019   Influenza,inj,Quad PF,6+ Mos 12/15/2012, 10/28/2014   Influenza-Unspecified 12/25/2013, 11/08/2015, 10/20/2016   Moderna Covid-19 Fall Seasonal Vaccine 78yrs & older 02/09/2022   PFIZER(Purple Top)SARS-COV-2 Vaccination 03/26/2019, 04/16/2019, 12/07/2019, 07/01/2020   Pneumococcal Conjugate-13 06/30/2013   Pneumococcal Polysaccharide-23 06/21/2006   Respiratory Syncytial Virus Vaccine,Recomb Aduvanted(Arexvy) 11/29/2021   Td 08/19/2002   Tdap 08/13/2014   Zoster Recombinant(Shingrix) 10/20/2018, 01/09/2019   Zoster, Live 12/28/2009    Past Medical History:  Diagnosis Date   Allergy    Anxiety    Arthritis    Cataract    beginning stage bilateral   Diverticulosis    Elevated PSA    Esophageal stricture    Esophagitis    GERD (gastroesophageal reflux disease)    Gout    History of kidney stones    Hyperlipidemia    Hypertension    Inguinal hernia    Internal hemorrhoids    Pancreatitis    Prostate cancer (HCC)    Tubular adenoma of colon     Tobacco History: Social History   Tobacco Use  Smoking Status Never  Smokeless Tobacco Never   Counseling given: Not Answered   Outpatient Medications Prior to Visit  Medication Sig Dispense Refill   acetaminophen (TYLENOL) 500 MG tablet Take 500 mg by mouth every 6 (six) hours as needed.     aspirin 81 MG tablet Take 81 mg by mouth daily.     Bioflavonoid Products (PERIDIN-C PO) Take by mouth.     carvedilol (COREG) 6.25 MG tablet Take 1 tablet (6.25 mg total) by mouth 2 (two) times daily with a meal. 60 tablet 2   fluticasone (FLONASE) 50 MCG/ACT nasal spray Place into both nostrils daily.     MITIGARE 0.6 MG CAPS TAKE 0.6 MG BY  MOUTH DAILY AS NEEDED (GOUT FLARE). 90 capsule 1   Multiple Vitamins-Minerals (CENTRUM SILVER 50+MEN PO) Take by mouth.     NON FORMULARY Clindamycin 150 mg     pantoprazole (PROTONIX) 40 MG tablet Take 1 tablet (40 mg total) by mouth 2 (two) times daily. 180 tablet 3   polyethylene glycol (MIRALAX / GLYCOLAX) 17 g packet Take 17 g by mouth daily.     tamsulosin (FLOMAX) 0.4 MG CAPS capsule TAKE 1 CAPSULE BY MOUTH EVERY DAY 90 capsule 3   valsartan (DIOVAN) 320 MG tablet TAKE 1 TABLET BY MOUTH EVERY DAY 90 tablet 1   Facility-Administered Medications Prior to Visit  Medication Dose Route Frequency Provider Last Rate Last Admin   0.9 %  sodium chloride infusion  500 mL Intravenous Once Armbruster, Willaim Rayas, MD         Review of Systems:  Constitutional:   No  weight loss, night sweats,  Fevers, chills, +fatigue, or  lassitude.  HEENT:   No headaches,  Difficulty swallowing,  Tooth/dental problems, or  Sore throat,                No sneezing, itching, ear ache, nasal congestion, post nasal drip,   CV:  No chest pain,  Orthopnea, PND, swelling in lower extremities, anasarca, dizziness, palpitations, syncope.   GI  No heartburn, indigestion, abdominal pain, nausea, vomiting, diarrhea, change in bowel habits, loss of appetite, bloody stools.   Resp: No shortness of breath with exertion or at rest.  No excess mucus, no productive cough,  No non-productive cough,  No coughing up of blood.  No change in color of mucus.  No wheezing.  No chest wall deformity  Skin: no rash or lesions.  GU: no dysuria, change in color of urine, no urgency or frequency.  No flank pain, no hematuria   MS:  No joint pain or swelling.  No decreased range of motion.  No back pain.    Physical Exam  BP (!) 140/84 (BP Location: Left Arm, Patient Position: Sitting, Cuff Size: Large)   Pulse 75   Ht 5\' 8"  (1.727 m)   Wt 264 lb 3.2 oz (119.8 kg)   SpO2 97%   BMI 40.17 kg/m   GEN: A/Ox3; pleasant , NAD, well  nourished    HEENT:  Secretary/AT,   NOSE-clear, THROAT-clear, no lesions, no postnasal drip or exudate noted. Class 3-4 MP airway   NECK:  Supple w/ fair ROM; no JVD; normal carotid impulses w/o bruits; no thyromegaly or nodules palpated; no lymphadenopathy.    RESP  Clear  P & A; w/o, wheezes/ rales/ or rhonchi. no accessory muscle use, no dullness to percussion  CARD:  RRR, no m/r/g, tr peripheral edema, pulses intact, no cyanosis or clubbing.  GI:   Soft & nt; nml bowel sounds; no organomegaly or masses detected.   Musco: Warm bil, no deformities or joint swelling noted.   Neuro: alert, no focal deficits noted.    Skin: Warm, no lesions or rashes    Lab Results:    BNP No results found for: "BNP"  ProBNP   Imaging: No results found.  Administration History     None           No data to display          No results found for: "NITRICOXIDE"      Assessment & Plan:   Snoring Loud snoring, restless sleep, daytime sleepiness, witnessed apneic events all concerning for underlying sleep apnea.  Will set patient up for home sleep study.  Patient education given on sleep apnea potential complications.  Patient has multiple comorbidities including chronic kidney disease and hypertension.  - discussed how weight can impact sleep and risk for sleep disordered breathing - discussed options to assist with weight loss: combination of diet modification, cardiovascular and strength training exercises   - had an extensive discussion regarding the adverse health consequences related to untreated sleep disordered breathing - specifically discussed the risks for hypertension, coronary artery disease, cardiac dysrhythmias, cerebrovascular disease, and diabetes - lifestyle modification discussed   - discussed how sleep disruption can increase risk of accidents, particularly when driving - safe driving practices were discussed   Plan  Patient Instructions  Set up for home  sleep study  Work on healthy weight loss  Do not drive if sleepy  Healthy sleep regimen  as discussed  Follow up in 6 weeks to discuss results and treatment plan. [facility] office )     Essential hypertension Continue follow up with PCP   Stage 3b chronic kidney disease (HCC) Continue follow up with Nephrology      Rubye Oaks, NP 11/27/2022

## 2022-11-27 NOTE — Assessment & Plan Note (Signed)
Continue follow up with Nephrology.

## 2022-11-27 NOTE — Assessment & Plan Note (Signed)
Continue follow up with PCP.

## 2022-11-27 NOTE — Assessment & Plan Note (Signed)
Loud snoring, restless sleep, daytime sleepiness, witnessed apneic events all concerning for underlying sleep apnea.  Will set patient up for home sleep study.  Patient education given on sleep apnea potential complications.  Patient has multiple comorbidities including chronic kidney disease and hypertension.  - discussed how weight can impact sleep and risk for sleep disordered breathing - discussed options to assist with weight loss: combination of diet modification, cardiovascular and strength training exercises   - had an extensive discussion regarding the adverse health consequences related to untreated sleep disordered breathing - specifically discussed the risks for hypertension, coronary artery disease, cardiac dysrhythmias, cerebrovascular disease, and diabetes - lifestyle modification discussed   - discussed how sleep disruption can increase risk of accidents, particularly when driving - safe driving practices were discussed   Plan  Patient Instructions  Set up for home sleep study  Work on healthy weight loss  Do not drive if sleepy  Healthy sleep regimen as discussed  Follow up in 6 weeks to discuss results and treatment plan. [facility] office )

## 2022-11-27 NOTE — Patient Instructions (Addendum)
Set up for home sleep study  Work on healthy weight loss  Do not drive if sleepy  Healthy sleep regimen as discussed  Follow up in 6 weeks to discuss results and treatment plan. [facility] office )

## 2022-11-28 ENCOUNTER — Ambulatory Visit: Payer: Medicare PPO | Attending: Cardiology

## 2022-11-28 DIAGNOSIS — R0602 Shortness of breath: Secondary | ICD-10-CM | POA: Diagnosis not present

## 2022-11-28 LAB — ECHOCARDIOGRAM COMPLETE
Area-P 1/2: 2.54 cm2
S' Lateral: 3.5 cm

## 2022-11-28 NOTE — Progress Notes (Signed)
Reviewed and agree with assessment/plan.   Coralyn Helling, MD [facility] Pulmonary/Critical Care 11/28/2022, 8:37 AM Pager:  [phone]

## 2022-12-07 ENCOUNTER — Encounter: Payer: Self-pay | Admitting: Cardiology

## 2022-12-07 ENCOUNTER — Ambulatory Visit: Payer: Medicare PPO | Attending: Cardiology | Admitting: Cardiology

## 2022-12-07 VITALS — BP 152/82 | HR 61 | Ht 68.0 in | Wt 261.2 lb

## 2022-12-07 DIAGNOSIS — Z6839 Body mass index (BMI) 39.0-39.9, adult: Secondary | ICD-10-CM | POA: Diagnosis not present

## 2022-12-07 DIAGNOSIS — I77810 Thoracic aortic ectasia: Secondary | ICD-10-CM

## 2022-12-07 DIAGNOSIS — R0602 Shortness of breath: Secondary | ICD-10-CM

## 2022-12-07 DIAGNOSIS — G4730 Sleep apnea, unspecified: Secondary | ICD-10-CM | POA: Diagnosis not present

## 2022-12-07 DIAGNOSIS — R0683 Snoring: Secondary | ICD-10-CM | POA: Diagnosis not present

## 2022-12-07 DIAGNOSIS — I10 Essential (primary) hypertension: Secondary | ICD-10-CM | POA: Diagnosis not present

## 2022-12-07 NOTE — Patient Instructions (Signed)
Medication Instructions:  Your physician recommends that you continue on your current medications as directed. Please refer to the Current Medication list given to you today.  *If you need a refill on your cardiac medications before your next appointment, please call your pharmacy*  Lab Work: -None ordered  Testing/Procedures: -None ordered  Follow-Up: At [facility], you and your health needs are our priority.  As part of our continuing mission to provide you with exceptional heart care, we have created designated Provider Care Teams.  These Care Teams include your primary Cardiologist (physician) and Advanced Practice Providers (APPs -  Physician Assistants and Nurse Practitioners) who all work together to provide you with the care you need, when you need it.  Your next appointment:   6 month(s)  Provider:   You may see Brian Agbor-Etang, MD or one of the following Advanced Practice Providers on your designated Care Team:   Christopher Berge, NP Ryan Dunn, PA-C Cadence Furth, PA-C Sheri Hammock, NP    Other Instructions -None  

## 2022-12-07 NOTE — Progress Notes (Signed)
Cardiology Office Note:    Date:  12/07/2022   ID:  [name], DOB 1940-12-03, MRN [id number]  PCP:  Glori Luis, MD   [facility] Providers Cardiologist:  Debbe Odea, MD     Referring MD: Glori Luis, MD   Chief Complaint  Patient presents with   Follow-up    Discuss cardiac testing results.  Patient denies new or acute cardiac problems/concerns today.      History of Present Illness:    [name] is a 82 y.o. male with a hx of hypertension, GERD, prostate cancer s/p radiation therapy who presents for follow-up.  He was last seen due to shortness of breath, fatigue, snoring.  Referral to sleep specialist was placed, sleep study being planned.  Echocardiogram obtained to evaluate any significant structural abnormalities.  Etiology for shortness of breath team deconditioning versus possible OSA.  Presents for echocardiogram results, no new concerns at this time.  Prior notes Norvasc caused leg edema, patient switched to carvedilol  Past Medical History:  Diagnosis Date   Allergy    Anxiety    Arthritis    Cataract    beginning stage bilateral   Diverticulosis    Elevated PSA    Esophageal stricture    Esophagitis    GERD (gastroesophageal reflux disease)    Gout    History of kidney stones    Hyperlipidemia    Hypertension    Inguinal hernia    Internal hemorrhoids    Pancreatitis    Prostate cancer (HCC)    Tubular adenoma of colon     Past Surgical History:  Procedure Laterality Date   BACK SURGERY  1980's   L4/5   BALLOON DILATION N/A 11/17/2018   Procedure: URETHRAL BALLOON DILATION;  Surgeon: Vanna Scotland, MD;  Location: [facility];  Service: Urology;  Laterality: N/A;   CARPAL TUNNEL RELEASE Right 08/15/2016   Procedure: CARPAL TUNNEL RELEASE ENDOSCOPIC;  Surgeon: Christena Flake, MD;  Location: [facility];  Service: Orthopedics;  Laterality: Right;   CATARACT EXTRACTION W/PHACO Right 03/14/2021   Procedure:  CATARACT EXTRACTION PHACO AND INTRAOCULAR LENS PLACEMENT (IOC) RIGHT 6.84 00:52.3;  Surgeon: Galen Manila, MD;  Location: [facility];  Service: Ophthalmology;  Laterality: Right;   CATARACT EXTRACTION W/PHACO Left 03/28/2021   Procedure: CATARACT EXTRACTION PHACO AND INTRAOCULAR LENS PLACEMENT (IOC) LEFT;  Surgeon: Galen Manila, MD;  Location: [facility];  Service: Ophthalmology;  Laterality: Left;   COLONOSCOPY     CYSTOSCOPY N/A 11/17/2018   Procedure: CYSTOSCOPY;  Surgeon: Vanna Scotland, MD;  Location: [facility];  Service: Urology;  Laterality: N/A;   CYSTOSCOPY WITH LITHOLAPAXY N/A 10/09/2017   Procedure: CYSTOSCOPY WITH LITHOLAPAXY;  Surgeon: Vanna Scotland, MD;  Location: [facility];  Service: Urology;  Laterality: N/A;   ESOPHAGOGASTRODUODENOSCOPY  11-21-06   Esophagitis, Stricture/dilated    HOLEP-LASER ENUCLEATION OF THE PROSTATE WITH MORCELLATION N/A 10/09/2017   Procedure: HOLEP-LASER ENUCLEATION OF THE PROSTATE WITH MORCELLATION;  Surgeon: Vanna Scotland, MD;  Location: [facility];  Service: Urology;  Laterality: N/A;   LYMPH NODE BIOPSY  1957   benign  lymph node removed   Right arm fracture     TONSILLECTOMY     TOTAL KNEE ARTHROPLASTY  09-02-02   Bilateral   UPPER GASTROINTESTINAL ENDOSCOPY      Current Medications: Current Meds  Medication Sig   acetaminophen (TYLENOL) 500 MG tablet Take 500 mg by mouth every 6 (six) hours as needed.  aspirin 81 MG tablet Take 81 mg by mouth daily.   Bioflavonoid Products (PERIDIN-C PO) Take by mouth.   carvedilol (COREG) 6.25 MG tablet Take 1 tablet (6.25 mg total) by mouth 2 (two) times daily with a meal.   fluticasone (FLONASE) 50 MCG/ACT nasal spray Place into both nostrils daily.   MITIGARE 0.6 MG CAPS TAKE 0.6 MG BY MOUTH DAILY AS NEEDED (GOUT FLARE).   Multiple Vitamins-Minerals (CENTRUM SILVER 50+MEN PO) Take by mouth.   NON FORMULARY Clindamycin 150 mg   pantoprazole (PROTONIX) 40 MG tablet Take 1 tablet  (40 mg total) by mouth 2 (two) times daily.   polyethylene glycol (MIRALAX / GLYCOLAX) 17 g packet Take 17 g by mouth daily.   tamsulosin (FLOMAX) 0.4 MG CAPS capsule TAKE 1 CAPSULE BY MOUTH EVERY DAY   valsartan (DIOVAN) 320 MG tablet TAKE 1 TABLET BY MOUTH EVERY DAY   Current Facility-Administered Medications for the 12/07/22 encounter (Office Visit) with Debbe Odea, MD  Medication   0.9 %  sodium chloride infusion     Allergies:   Penicillins   Social History   Socioeconomic History   Marital status: Married    Spouse name: Not on file   Number of children: 1   Years of education: Not on file   Highest education level: Bachelor's degree (e.g., BA, AB, BS)  Occupational History   Occupation: Retired Designer, jewellery: [employer]    Comment: sales floors   Tobacco Use   Smoking status: Never   Smokeless tobacco: Never  Vaping Use   Vaping status: Never Used  Substance and Sexual Activity   Alcohol use: No   Drug use: No   Sexual activity: Yes  Other Topics Concern   Not on file  Social History Narrative   Lives in [place] with wife. From [place], [place]. 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 Daughter, [name], lives in [place]. No pets.      Exercise: regular   Diet: increased sweets   Social Determinants of Health   Financial Resource Strain: Low Risk  (10/26/2022)   Overall Financial Resource Strain (CARDIA)    Difficulty of Paying Living Expenses: Not hard at all  Food Insecurity: No Food Insecurity (10/26/2022)   Hunger Vital Sign    Worried About Running Out of Food in the Last Year: Never true    Ran Out of Food in the Last Year: Never true  Transportation Needs: No Transportation Needs (10/26/2022)   PRAPARE - Administrator, Civil Service (Medical): No    Lack of Transportation (Non-Medical): No  Physical Activity: Inactive (10/26/2022)   Exercise Vital Sign    Days of Exercise per Week: 0 days    Minutes of Exercise  per Session: 0 min  Stress: No Stress Concern Present (10/26/2022)   [employer] of Occupational Health - Occupational Stress Questionnaire    Feeling of Stress : Only a little  Social Connections: Socially Integrated (10/26/2022)   Social Connection and Isolation Panel [NHANES]    Frequency of Communication with Friends and Family: More than three times a week    Frequency of Social Gatherings with Friends and Family: More than three times a week    Attends Religious Services: More than 4 times per year    Active Member of [employer] or Organizations: Yes    Attends Engineer, structural: More than 4 times per year    Marital Status: Married  Family History: The patient's family history includes Arthritis in his father and mother; Cancer in his brother; Depression in his mother; Diverticulitis in his father; Hypertension in his brother; Obesity in his brother; Ulcers in his father. There is no history of Colon cancer, Colon polyps, Esophageal cancer, Rectal cancer, Stomach cancer, Kidney disease, Kidney cancer, or Prostate cancer.  ROS:   Please see the history of present illness.     All other systems reviewed and are negative.  EKGs/Labs/Other Studies Reviewed:    The following studies were reviewed today:       Recent Labs: 08/10/2022: ALT 21; TSH 2.56 09/10/2022: BUN 25; Creatinine, Ser 1.54; Potassium 4.8; Sodium 141 10/15/2022: Hemoglobin 12.6; Platelets 217.0  Recent Lipid Panel    Component Value Date/Time   CHOL 177 03/16/2022 1042   CHOL 118 08/31/2013 0401   TRIG 160.0 (H) 03/16/2022 1042   TRIG 59 08/31/2013 0401   HDL 36.20 (L) 03/16/2022 1042   HDL 33 (L) 08/31/2013 0401   CHOLHDL 5 03/16/2022 1042   VLDL 32.0 03/16/2022 1042   VLDL 12 08/31/2013 0401   LDLCALC 109 (H) 03/16/2022 1042   LDLCALC 73 08/31/2013 0401   LDLDIRECT 104.5 05/07/2011 1415     Risk Assessment/Calculations:   HYPERTENSION CONTROL Vitals:   12/07/22 0943 12/07/22 0948  BP:  (!) 142/76 (!) 152/82    The patient's blood pressure is elevated above target today.  In order to address the patient's elevated BP: Blood pressure will be monitored at home to determine if medication changes need to be made.            Physical Exam:    VS:  BP (!) 152/82 (BP Location: Left Arm, Patient Position: Sitting, Cuff Size: Large)   Pulse 61   Ht 5\' 8"  (1.727 m)   Wt 261 lb 3.2 oz (118.5 kg)   SpO2 96%   BMI 39.72 kg/m     Wt Readings from Last 3 Encounters:  12/07/22 261 lb 3.2 oz (118.5 kg)  11/27/22 264 lb 3.2 oz (119.8 kg)  10/26/22 264 lb (119.7 kg)     GEN:  Well nourished, well developed in no acute distress HEENT: Normal NECK: No JVD; No carotid bruits CARDIAC: RRR, no murmurs, rubs, gallops RESPIRATORY:  Clear to auscultation without rales, wheezing or rhonchi  ABDOMEN: Soft, non-tender, non-distended MUSCULOSKELETAL:  No edema; No deformity  SKIN: Warm and dry NEUROLOGIC:  Alert and oriented x 3 PSYCHIATRIC:  Normal affect   ASSESSMENT:    1. SOB (shortness of breath)   2. Mild ascending aorta dilatation (HCC)   3. Snoring   4. Primary hypertension   5. BMI 39.0-39.9,adult    PLAN:    In order of problems listed above:  Shortness of breath, echo with EF 55 to 60%, impaired relaxation, no structural abnormalities to suggest etiology of shortness of breath.  Etiology is combination of deconditioning, obesity, possible OSA .  Low-calorie diet, increase activity/exercise strongly encouraged. Mild ascending aorta dilatation, 40 mm.  Monitor serially with echoes. Snoring, daytime fatigue, somnolence.  Evaluated by sleep specialist, home sleep study being planned.  Management as per sleep specialist. Hypertension, BP elevated today, usually controlled.  Continue valsartan 320 mg daily, Coreg 6.25 mg twice daily. Obesity, low-calorie diet, weight loss advised.  Follow-up in 6 months.      Medication Adjustments/Labs and Tests Ordered: Current  medicines are reviewed at length with the patient today.  Concerns regarding medicines are outlined above.  No  orders of the defined types were placed in this encounter.  No orders of the defined types were placed in this encounter.   Patient Instructions  Medication Instructions:  Your physician recommends that you continue on your current medications as directed. Please refer to the Current Medication list given to you today.  *If you need a refill on your cardiac medications before your next appointment, please call your pharmacy*  Lab Work: -None ordered  Testing/Procedures: -None ordered  Follow-Up: At [facility], you and your health needs are our priority.  As part of our continuing mission to provide you with exceptional heart care, we have created designated Provider Care Teams.  These Care Teams include your primary Cardiologist (physician) and Advanced Practice Providers (APPs -  Physician Assistants and Nurse Practitioners) who all work together to provide you with the care you need, when you need it.  Your next appointment:   6 month(s)  Provider:   You may see Debbe Odea, MD or one of the following Advanced Practice Providers on your designated Care Team:   Nicolasa Ducking, NP Eula Listen, PA-C Cadence Fransico Michael, PA-C Charlsie Quest, NP    Other Instructions -None    Signed, Debbe Odea, MD  12/07/2022 11:08 AM    [facility]

## 2022-12-10 ENCOUNTER — Encounter: Payer: Self-pay | Admitting: Family Medicine

## 2022-12-10 ENCOUNTER — Ambulatory Visit: Payer: Medicare PPO | Admitting: Family Medicine

## 2022-12-10 VITALS — BP 124/78 | HR 58 | Temp 98.0°F | Ht 68.0 in | Wt 259.8 lb

## 2022-12-10 DIAGNOSIS — R0609 Other forms of dyspnea: Secondary | ICD-10-CM | POA: Diagnosis not present

## 2022-12-10 DIAGNOSIS — I10 Essential (primary) hypertension: Secondary | ICD-10-CM | POA: Diagnosis not present

## 2022-12-10 NOTE — Assessment & Plan Note (Signed)
Chronic issue.  Adequately controlled.  Patient will continue carvedilol 6.25 mg twice daily and valsartan 320 mg daily.

## 2022-12-10 NOTE — Assessment & Plan Note (Signed)
Chronic issue.  Generally stable.  Given reassuring evaluation with cardiology we discussed that the patient could start to increase his activity level progressively.  Also encouraged continued dietary.  I discussed likely cause of dyspnea is obesity and deconditioning though possible sleep apnea could be contributing as well.

## 2022-12-10 NOTE — Progress Notes (Signed)
Marikay Alar, MD Phone: [phone]  [name] is a 82 y.o. male who presents today for f/u.  HYPERTENSION Disease Monitoring Home BP Monitoring not checking Chest pain- no    Dyspnea- stable Medications Compliance-  taking coreg, valsartan.  BMET    Component Value Date/Time   NA 141 09/10/2022 1030   NA 137 09/01/2013 0410   K 4.8 09/10/2022 1030   K 3.6 09/01/2013 0410   CL 101 09/10/2022 1030   CL 106 09/01/2013 0410   CO2 30 09/10/2022 1030   CO2 23 09/01/2013 0410   GLUCOSE 104 (H) 09/10/2022 1030   GLUCOSE 107 (H) 09/01/2013 0410   BUN 25 (H) 09/10/2022 1030   BUN 13 06/24/2017 1039   BUN 14 09/01/2013 0410   CREATININE 1.54 (H) 09/10/2022 1030   CREATININE 1.40 (H) 09/01/2013 0410   CALCIUM 9.8 09/10/2022 1030   CALCIUM 8.0 (L) 09/01/2013 0410   GFRNONAA 46 (L) 09/01/2021 1201   GFRNONAA 49 (L) 09/01/2013 0410   GFRAA 57 (L) 10/04/2017 1306   GFRAA 57 (L) 09/01/2013 0410   Dyspnea on exertion: This is a chronic issue.  He saw cardiology for this and had a reassuring echo.  Cardiology felt as though the dyspnea was related to deconditioning, obesity, and/or sleep apnea.  He did have a sleep study done last week and is going to turn in the device this week.  He has cut back on sweets and is down a few pounds.  He is going to progressively increase exercise given reassuring cardiac evaluation.  Social History   Tobacco Use  Smoking Status Never  Smokeless Tobacco Never    Current Outpatient Medications on File Prior to Visit  Medication Sig Dispense Refill   acetaminophen (TYLENOL) 500 MG tablet Take 500 mg by mouth every 6 (six) hours as needed.     aspirin 81 MG tablet Take 81 mg by mouth daily.     Bioflavonoid Products (PERIDIN-C PO) Take by mouth.     carvedilol (COREG) 6.25 MG tablet Take 1 tablet (6.25 mg total) by mouth 2 (two) times daily with a meal. 60 tablet 2   fluticasone (FLONASE) 50 MCG/ACT nasal spray Place into both nostrils daily.      MITIGARE 0.6 MG CAPS TAKE 0.6 MG BY MOUTH DAILY AS NEEDED (GOUT FLARE). 90 capsule 1   Multiple Vitamins-Minerals (CENTRUM SILVER 50+MEN PO) Take by mouth.     NON FORMULARY Clindamycin 150 mg     pantoprazole (PROTONIX) 40 MG tablet Take 1 tablet (40 mg total) by mouth 2 (two) times daily. 180 tablet 3   polyethylene glycol (MIRALAX / GLYCOLAX) 17 g packet Take 17 g by mouth daily.     tamsulosin (FLOMAX) 0.4 MG CAPS capsule TAKE 1 CAPSULE BY MOUTH EVERY DAY 90 capsule 3   valsartan (DIOVAN) 320 MG tablet TAKE 1 TABLET BY MOUTH EVERY DAY 90 tablet 1   Current Facility-Administered Medications on File Prior to Visit  Medication Dose Route Frequency Provider Last Rate Last Admin   0.9 %  sodium chloride infusion  500 mL Intravenous Once Armbruster, Willaim Rayas, MD         ROS see history of present illness  Objective  Physical Exam Vitals:   12/10/22 1135  BP: 124/78  Pulse: (!) 58  Temp: 98 F (36.7 C)  SpO2: 96%    BP Readings from Last 3 Encounters:  12/10/22 124/78  12/07/22 (!) 152/82  11/27/22 (!) 140/84   Wt  Readings from Last 3 Encounters:  12/10/22 259 lb 12.8 oz (117.8 kg)  12/07/22 261 lb 3.2 oz (118.5 kg)  11/27/22 264 lb 3.2 oz (119.8 kg)    Physical Exam Constitutional:      General: He is not in acute distress.    Appearance: He is not diaphoretic.  Cardiovascular:     Rate and Rhythm: Normal rate and regular rhythm.     Heart sounds: Normal heart sounds.  Pulmonary:     Effort: Pulmonary effort is normal.     Breath sounds: Normal breath sounds.  Skin:    General: Skin is warm and dry.  Neurological:     Mental Status: He is alert.      Assessment/Plan: Please see individual problem list.  Essential hypertension Assessment & Plan: Chronic issue.  Adequately controlled.  Patient will continue carvedilol 6.25 mg twice daily and valsartan 320 mg daily.   Dyspnea on exertion Assessment & Plan: Chronic issue.  Generally stable.  Given  reassuring evaluation with cardiology we discussed that the patient could start to increase his activity level progressively.  Also encouraged continued dietary.  I discussed likely cause of dyspnea is obesity and deconditioning though possible sleep apnea could be contributing as well.     Return in about 3 months (around 03/12/2023) for htn.   Marikay Alar, MD [facility]

## 2022-12-15 ENCOUNTER — Other Ambulatory Visit: Payer: Self-pay | Admitting: Family Medicine

## 2022-12-15 DIAGNOSIS — I10 Essential (primary) hypertension: Secondary | ICD-10-CM

## 2022-12-19 DIAGNOSIS — M9903 Segmental and somatic dysfunction of lumbar region: Secondary | ICD-10-CM | POA: Diagnosis not present

## 2022-12-19 DIAGNOSIS — M531 Cervicobrachial syndrome: Secondary | ICD-10-CM | POA: Diagnosis not present

## 2022-12-19 DIAGNOSIS — M9901 Segmental and somatic dysfunction of cervical region: Secondary | ICD-10-CM | POA: Diagnosis not present

## 2022-12-19 DIAGNOSIS — M4306 Spondylolysis, lumbar region: Secondary | ICD-10-CM | POA: Diagnosis not present

## 2022-12-19 DIAGNOSIS — M9904 Segmental and somatic dysfunction of sacral region: Secondary | ICD-10-CM | POA: Diagnosis not present

## 2022-12-19 DIAGNOSIS — M9905 Segmental and somatic dysfunction of pelvic region: Secondary | ICD-10-CM | POA: Diagnosis not present

## 2022-12-19 DIAGNOSIS — M4302 Spondylolysis, cervical region: Secondary | ICD-10-CM | POA: Diagnosis not present

## 2022-12-19 DIAGNOSIS — M9902 Segmental and somatic dysfunction of thoracic region: Secondary | ICD-10-CM | POA: Diagnosis not present

## 2022-12-20 ENCOUNTER — Other Ambulatory Visit: Payer: Medicare PPO

## 2022-12-20 DIAGNOSIS — C61 Malignant neoplasm of prostate: Secondary | ICD-10-CM

## 2022-12-21 LAB — PSA: Prostate Specific Ag, Serum: <0.1 ng/mL (ref 0.0–4.0)

## 2022-12-26 ENCOUNTER — Ambulatory Visit: Payer: Medicare PPO | Admitting: Urology

## 2022-12-26 VITALS — BP 171/78 | HR 65 | Ht 68.0 in | Wt 259.0 lb

## 2022-12-26 DIAGNOSIS — C61 Malignant neoplasm of prostate: Secondary | ICD-10-CM

## 2022-12-26 DIAGNOSIS — R5383 Other fatigue: Secondary | ICD-10-CM | POA: Diagnosis not present

## 2022-12-26 MED ORDER — LEUPROLIDE ACETATE (6 MONTH) 45 MG ~~LOC~~ KIT
45.0000 mg | PACK | Freq: Once | SUBCUTANEOUS | Status: AC
Start: 2022-12-26 — End: 2022-12-26
  Administered 2022-12-26: 45 mg via SUBCUTANEOUS

## 2022-12-26 NOTE — Progress Notes (Signed)
Eligard SubQ Injection   Due to Prostate Cancer patient is present today for a Eligard Injection.  Medication: Eligard 6 month Dose: 45 mg  Location: right uoq Lot: [id number] Exp: 01/2024  Patient tolerated well, no complications were noted  Performed by: Dr Apolinar Junes  Per Dr. Apolinar Junes patient is to receive his last dose today, most likely will not continue this medication after today. Patient's next follow up was scheduled for 06/25/2023. This appointment was scheduled using wheel and given to patient today along with reminder continue on Vitamin D 800-1000iu and Calcium 1000-1200mg  daily while on Androgen Deprivation Therapy.  PA approval dates: NO PA required

## 2022-12-26 NOTE — Progress Notes (Signed)
I, Maysun Anabel Bene, acting as a scribe for Vanna Scotland, MD., have documented all relevant documentation on the behalf of Vanna Scotland, MD, as directed by Vanna Scotland, MD while in the presence of Vanna Scotland, MD.  12/26/2022 1:23 PM   [name] Nov 11, 1940 [id number]  Referring provider: Glori Luis, MD [address] [place],  [place] [address]  Chief Complaint  Patient presents with   Prostate Cancer    HPI: 82 year old male with a personal history of prostate cancer and BPH returns today for a 6 month follow up.   His PSA at the time of diagnosis was 13.1 He is s/p fusion biopsy on 05/02/2021. Surgical pathology was consistent with Gleason 4+4 involving 3 of 12 cores affecting up to 80% primarily in the right apex, and Gleason 4+3 involving 1 of 12 cored affecting 5% primarily in the right mid. He has tolerated radiation well.   He is s/p HoLep and cystolitholapaxy in 2019.    He's got a PSA today that remains undetectable. He's still using Flomax for urinary symptoms.  He feels very tired since starting meds.  He notes he had his heart check and was told it was fine.  He is wanting to get back to more activity.     PMH: Past Medical History:  Diagnosis Date   Allergy    Anxiety    Arthritis    Cataract    beginning stage bilateral   Diverticulosis    Elevated PSA    Esophageal stricture    Esophagitis    GERD (gastroesophageal reflux disease)    Gout    History of kidney stones    Hyperlipidemia    Hypertension    Inguinal hernia    Internal hemorrhoids    Pancreatitis    Prostate cancer (HCC)    Tubular adenoma of colon     Surgical History: Past Surgical History:  Procedure Laterality Date   BACK SURGERY  1980's   L4/5   BALLOON DILATION N/A 11/17/2018   Procedure: URETHRAL BALLOON DILATION;  Surgeon: Vanna Scotland, MD;  Location: [facility];  Service: Urology;  Laterality: N/A;   CARPAL TUNNEL RELEASE Right 08/15/2016    Procedure: CARPAL TUNNEL RELEASE ENDOSCOPIC;  Surgeon: Christena Flake, MD;  Location: [facility];  Service: Orthopedics;  Laterality: Right;   CATARACT EXTRACTION W/PHACO Right 03/14/2021   Procedure: CATARACT EXTRACTION PHACO AND INTRAOCULAR LENS PLACEMENT (IOC) RIGHT 6.84 00:52.3;  Surgeon: Galen Manila, MD;  Location: [facility];  Service: Ophthalmology;  Laterality: Right;   CATARACT EXTRACTION W/PHACO Left 03/28/2021   Procedure: CATARACT EXTRACTION PHACO AND INTRAOCULAR LENS PLACEMENT (IOC) LEFT;  Surgeon: Galen Manila, MD;  Location: [facility];  Service: Ophthalmology;  Laterality: Left;   COLONOSCOPY     CYSTOSCOPY N/A 11/17/2018   Procedure: CYSTOSCOPY;  Surgeon: Vanna Scotland, MD;  Location: [facility];  Service: Urology;  Laterality: N/A;   CYSTOSCOPY WITH LITHOLAPAXY N/A 10/09/2017   Procedure: CYSTOSCOPY WITH LITHOLAPAXY;  Surgeon: Vanna Scotland, MD;  Location: [facility];  Service: Urology;  Laterality: N/A;   ESOPHAGOGASTRODUODENOSCOPY  11-21-06   Esophagitis, Stricture/dilated    HOLEP-LASER ENUCLEATION OF THE PROSTATE WITH MORCELLATION N/A 10/09/2017   Procedure: HOLEP-LASER ENUCLEATION OF THE PROSTATE WITH MORCELLATION;  Surgeon: Vanna Scotland, MD;  Location: [facility];  Service: Urology;  Laterality: N/A;   LYMPH NODE BIOPSY  1957   benign  lymph node removed   Right arm fracture  TONSILLECTOMY     TOTAL KNEE ARTHROPLASTY  09-02-02   Bilateral   UPPER GASTROINTESTINAL ENDOSCOPY      Home Medications:  Allergies as of 12/26/2022       Reactions   Penicillins Rash   Did it involve swelling of the face/tongue/throat, SOB, or low BP? No Did it involve sudden or severe rash/hives, skin peeling, or any reaction on the inside of your mouth or nose? No Did you need to seek medical attention at a hospital or doctor's office? No When did it last happen?      50 Years If all above answers are "NO", may proceed with cephalosporin use.         Medication List        Accurate as of December 26, 2022  1:23 PM. If you have any questions, ask your nurse or doctor.          acetaminophen 500 MG tablet Commonly known as: TYLENOL Take 500 mg by mouth every 6 (six) hours as needed.   aspirin 81 MG tablet Take 81 mg by mouth daily.   carvedilol 6.25 MG tablet Commonly known as: COREG TAKE 1 TABLET BY MOUTH 2 TIMES DAILY WITH A MEAL.   CENTRUM SILVER 50+MEN PO Take by mouth.   fluticasone 50 MCG/ACT nasal spray Commonly known as: FLONASE Place into both nostrils daily.   Mitigare 0.6 MG Caps Generic drug: Colchicine TAKE 0.6 MG BY MOUTH DAILY AS NEEDED (GOUT FLARE).   NON FORMULARY Clindamycin 150 mg   pantoprazole 40 MG tablet Commonly known as: PROTONIX Take 1 tablet (40 mg total) by mouth 2 (two) times daily.   PERIDIN-C PO Take by mouth.   polyethylene glycol 17 g packet Commonly known as: MIRALAX / GLYCOLAX Take 17 g by mouth daily.   tamsulosin 0.4 MG Caps capsule Commonly known as: FLOMAX TAKE 1 CAPSULE BY MOUTH EVERY DAY   valsartan 320 MG tablet Commonly known as: DIOVAN TAKE 1 TABLET BY MOUTH EVERY DAY        Allergies:  Allergies  Allergen Reactions   Penicillins Rash    Did it involve swelling of the face/tongue/throat, SOB, or low BP? No Did it involve sudden or severe rash/hives, skin peeling, or any reaction on the inside of your mouth or nose? No Did you need to seek medical attention at a hospital or doctor's office? No When did it last happen?      50 Years If all above answers are "NO", may proceed with cephalosporin use.     Family History: Family History  Problem Relation Age of Onset   Ulcers Father    Diverticulitis Father    Arthritis Father    Hypertension Brother    Obesity Brother    Cancer Brother        throat   Arthritis Mother    Depression Mother        Anxiety   Colon cancer Neg Hx    Colon polyps Neg Hx    Esophageal cancer Neg Hx    Rectal  cancer Neg Hx    Stomach cancer Neg Hx    Kidney disease Neg Hx    Kidney cancer Neg Hx    Prostate cancer Neg Hx     Social History:  reports that he has never smoked. He has never used smokeless tobacco. He reports that he does not drink alcohol and does not use drugs.   Physical Exam: BP (!) 171/78   Pulse  65   Ht 5\' 8"  (1.727 m)   Wt 259 lb (117.5 kg)   BMI 39.38 kg/m   Constitutional:  Alert and oriented, No acute distress.  Using Smick.  Accompanied by wife today.   HEENT: [place] AT, moist mucus membranes.  Trachea midline, no masses. Neurologic: Grossly intact, no focal deficits, moving all 4 extremities. Psychiatric: Normal mood and affect.   Assessment & Plan:    1. Prostate cancer High risk s/p IMRT now on adjuvant ADT  We're going to give him an injection today and we'll see him in 6 months with another PSA. This will likely be his last shot (see below); will have almost completed 2 years.     2. Fatigue  He's using a Seivert today. Says his activities been limited since initiation of the injections.   Suspicious that the ADT may be contributing to his fatigue. Discussed transitioning him over to PO versus stopping. Ultimately he'd like to continue but it's reasonable that this may be his last.   Plan to check a PSA in 6 months and reaccess at that time, but likely he will have completed nearly 2 years of ADT and will likely discontinue at that time.   I have reviewed the above documentation for accuracy and completeness, and I agree with the above.   Vanna Scotland, MD  [facility] [address] [place], [place] [address] [phone]

## 2022-12-27 ENCOUNTER — Ambulatory Visit
Admission: RE | Admit: 2022-12-27 | Discharge: 2022-12-27 | Disposition: A | Discharge status: Home / Self Care | Payer: Medicare PPO | Source: Ambulatory Visit | Attending: Radiation Oncology | Admitting: Radiation Oncology

## 2022-12-27 ENCOUNTER — Encounter: Payer: Self-pay | Admitting: Radiation Oncology

## 2022-12-27 VITALS — BP 160/80 | HR 69 | Temp 97.4°F | Resp 20 | Ht 68.0 in | Wt 259.0 lb

## 2022-12-27 DIAGNOSIS — R631 Polydipsia: Secondary | ICD-10-CM | POA: Diagnosis not present

## 2022-12-27 DIAGNOSIS — C61 Malignant neoplasm of prostate: Secondary | ICD-10-CM | POA: Diagnosis not present

## 2022-12-27 DIAGNOSIS — R5383 Other fatigue: Secondary | ICD-10-CM | POA: Insufficient documentation

## 2022-12-27 DIAGNOSIS — R351 Nocturia: Secondary | ICD-10-CM | POA: Diagnosis not present

## 2022-12-27 DIAGNOSIS — Z923 Personal history of irradiation: Secondary | ICD-10-CM | POA: Insufficient documentation

## 2022-12-27 DIAGNOSIS — Z191 Hormone sensitive malignancy status: Secondary | ICD-10-CM | POA: Diagnosis not present

## 2022-12-27 NOTE — Progress Notes (Signed)
Radiation Oncology Follow up Note  Name: [name]   Date:   12/27/2022 MRN:  [id number] DOB: 04/19/1940    This 82 y.o. male presents to the clinic today for 72-month follow-up status post image guided IMRT radiation therapy for Gleason 8 adenocarcinoma currently on ADT therapy with Eligard.  REFERRING PROVIDER: Glori Luis, MD  HPI: The patient, an 82 year old with Gleason 8 adenocarcinoma of the prostate, presents for a 10-month follow-up after completing image-guided radiation therapy (IMRT). The patient's recent PSA is less than 0.1, indicating an excellent response to therapy. He reports feeling weak all the time, which he attributes to the Eligard injections he is receiving for hormone therapy.  The patient also mentions a recent heart check-up, which came back normal. He has started to increase his exercise and improve his diet, resulting in a weight loss of five to six pounds..  COMPLICATIONS OF TREATMENT: none  FOLLOW UP COMPLIANCE: keeps appointments   PHYSICAL EXAM:  BP (!) 160/80 Comment: PCP monitors BP  Pulse 69   Temp (!) 97.4 F (36.3 C)   Resp 20   Ht 5\' 8"  (1.727 m) Comment: stated ht  Wt 259 lb (117.5 kg)   BMI 39.38 kg/m  Well-developed well-nourished patient in NAD. HEENT reveals PERLA, EOMI, discs not visualized.  Oral cavity is clear. No oral mucosal lesions are identified. Neck is clear without evidence of cervical or supraclavicular adenopathy. Lungs are clear to A&P. Cardiac examination is essentially unremarkable with regular rate and rhythm without murmur rub or thrill. Abdomen is benign with no organomegaly or masses noted. Motor sensory and DTR levels are equal and symmetric in the upper and lower extremities. Cranial nerves II through XII are grossly intact. Proprioception is intact. No peripheral adenopathy or edema is identified. No motor or sensory levels are noted. Crude visual fields are within normal range.   RADIOLOGY RESULTS: No current  films for review  PLAN: Prostate Cancer (Gleason 8) Status post IMRT radiation therapy with excellent response (PSA <0.1). Currently on Eligard for hormone therapy, which may be contributing to reported weakness. -Continue current treatment plan. -Consider discontinuation of Eligard at next visit with Dr. Apolinar Junes due to side effects.  Nocturia Reports waking up three times at night to urinate. Currently taking Flomax in the morning. -Change Flomax administration to evening to potentially reduce nocturia.  General Health Maintenance Reports weakness and fatigue. Recently had cardiac evaluation which was normal. Has started to increase exercise and improve diet. -Encourage continued exercise and healthy diet. -Plan for follow-up in one year.    Carmina Miller, MD

## 2022-12-31 DIAGNOSIS — G4733 Obstructive sleep apnea (adult) (pediatric): Secondary | ICD-10-CM | POA: Diagnosis not present

## 2023-01-16 DIAGNOSIS — M4306 Spondylolysis, lumbar region: Secondary | ICD-10-CM | POA: Diagnosis not present

## 2023-01-16 DIAGNOSIS — M9903 Segmental and somatic dysfunction of lumbar region: Secondary | ICD-10-CM | POA: Diagnosis not present

## 2023-01-16 DIAGNOSIS — M9905 Segmental and somatic dysfunction of pelvic region: Secondary | ICD-10-CM | POA: Diagnosis not present

## 2023-01-16 DIAGNOSIS — M9904 Segmental and somatic dysfunction of sacral region: Secondary | ICD-10-CM | POA: Diagnosis not present

## 2023-01-16 DIAGNOSIS — M9902 Segmental and somatic dysfunction of thoracic region: Secondary | ICD-10-CM | POA: Diagnosis not present

## 2023-01-16 DIAGNOSIS — M531 Cervicobrachial syndrome: Secondary | ICD-10-CM | POA: Diagnosis not present

## 2023-01-16 DIAGNOSIS — M9901 Segmental and somatic dysfunction of cervical region: Secondary | ICD-10-CM | POA: Diagnosis not present

## 2023-01-16 DIAGNOSIS — M4302 Spondylolysis, cervical region: Secondary | ICD-10-CM | POA: Diagnosis not present

## 2023-01-17 ENCOUNTER — Encounter: Payer: Self-pay | Admitting: Adult Health

## 2023-01-17 ENCOUNTER — Ambulatory Visit: Payer: Medicare PPO | Admitting: Adult Health

## 2023-01-17 VITALS — BP 130/70 | HR 60 | Temp 97.6°F | Ht 68.0 in | Wt 262.2 lb

## 2023-01-17 DIAGNOSIS — G4733 Obstructive sleep apnea (adult) (pediatric): Secondary | ICD-10-CM | POA: Diagnosis not present

## 2023-01-17 NOTE — Progress Notes (Signed)
@Patient  ID: [name], male    DOB: 22-May-1940, 82 y.o.   MRN: [id number]  Chief Complaint  Patient presents with   Follow-up    Referring provider: Glori Luis, MD  HPI: 82 yo male seen for sleep consult 11/27/22 for loud snoring and daytime sleepiness found to have severe OSA   TEST/EVENTS :  HST 12/07/22 AHI 40.2, SpO2 low 74%.   01/17/2023 Follow up ; OSA  Patient presents for a 6-week follow-up.  Patient was seen last visit for sleep consult with loud snoring, restless sleep and daytime sleepiness.  He was set up for home sleep study was done December 07, 2022 that showed severe sleep apnea with AHI at 40.2/hour and SpO2 low at 74%. We discussed his sleep study results in detail.  Went over treatment options.  Patient would like to proceed with CPAP therapy. Wife is on CPAP at home.  Allergies  Allergen Reactions   Penicillins Rash    Did it involve swelling of the face/tongue/throat, SOB, or low BP? No Did it involve sudden or severe rash/hives, skin peeling, or any reaction on the inside of your mouth or nose? No Did you need to seek medical attention at a hospital or doctor's office? No When did it last happen?      50 Years If all above answers are "NO", may proceed with cephalosporin use.     Immunization History  Administered Date(s) Administered   Fluad Quad(high Dose 65+) 11/26/2018, 01/09/2019, 11/23/2020, 11/20/2021   Fluad Trivalent(High Dose 65+) 11/27/2022   Influenza Whole 01/03/2006, 12/24/2011   Influenza, High Dose Seasonal PF 10/20/2016, 11/03/2017, 12/03/2017, 11/26/2019   Influenza,inj,Quad PF,6+ Mos 12/15/2012, 10/28/2014   Influenza-Unspecified 12/25/2013, 11/08/2015, 10/20/2016   Moderna Covid-19 Fall Seasonal Vaccine 48yrs & older 12/07/2022   PFIZER(Purple Top)SARS-COV-2 Vaccination 03/26/2019, 04/16/2019, 12/07/2019, 07/01/2020   Pneumococcal Conjugate-13 06/30/2013   Pneumococcal Polysaccharide-23 06/21/2006   Respiratory  Syncytial Virus Vaccine,Recomb Aduvanted(Arexvy) 11/29/2021   Td 08/19/2002   Tdap 08/13/2014   Zoster Recombinant(Shingrix) 10/20/2018, 01/09/2019   Zoster, Live 12/28/2009    Past Medical History:  Diagnosis Date   Allergy    Anxiety    Arthritis    Cataract    beginning stage bilateral   Diverticulosis    Elevated PSA    Esophageal stricture    Esophagitis    GERD (gastroesophageal reflux disease)    Gout    History of kidney stones    Hyperlipidemia    Hypertension    Inguinal hernia    Internal hemorrhoids    Pancreatitis    Prostate cancer (HCC)    Tubular adenoma of colon     Tobacco History: Social History   Tobacco Use  Smoking Status Never  Smokeless Tobacco Never   Counseling given: Not Answered   Outpatient Medications Prior to Visit  Medication Sig Dispense Refill   acetaminophen (TYLENOL) 500 MG tablet Take 500 mg by mouth every 6 (six) hours as needed.     aspirin 81 MG tablet Take 81 mg by mouth daily.     Bioflavonoid Products (PERIDIN-C PO) Take by mouth.     carvedilol (COREG) 6.25 MG tablet TAKE 1 TABLET BY MOUTH 2 TIMES DAILY WITH A MEAL. 180 tablet 1   fluticasone (FLONASE) 50 MCG/ACT nasal spray Place into both nostrils daily.     MITIGARE 0.6 MG CAPS TAKE 0.6 MG BY MOUTH DAILY AS NEEDED (GOUT FLARE). 90 capsule 1   Multiple Vitamins-Minerals (CENTRUM SILVER 50+MEN PO)  Take by mouth.     NON FORMULARY Clindamycin 150 mg     pantoprazole (PROTONIX) 40 MG tablet Take 1 tablet (40 mg total) by mouth 2 (two) times daily. 180 tablet 3   polyethylene glycol (MIRALAX / GLYCOLAX) 17 g packet Take 17 g by mouth daily.     tamsulosin (FLOMAX) 0.4 MG CAPS capsule TAKE 1 CAPSULE BY MOUTH EVERY DAY 90 capsule 3   valsartan (DIOVAN) 320 MG tablet TAKE 1 TABLET BY MOUTH EVERY DAY 90 tablet 1   Facility-Administered Medications Prior to Visit  Medication Dose Route Frequency Provider Last Rate Last Admin   0.9 %  sodium chloride infusion  500 mL  Intravenous Once Armbruster, Willaim Rayas, MD         Review of Systems:   Constitutional:   No  weight loss, night sweats,  Fevers, chills, +fatigue, or  lassitude.  HEENT:   No headaches,  Difficulty swallowing,  Tooth/dental problems, or  Sore throat,                No sneezing, itching, ear ache, nasal congestion, post nasal drip,   CV:  No chest pain,  Orthopnea, PND, swelling in lower extremities, anasarca, dizziness, palpitations, syncope.   GI  No heartburn, indigestion, abdominal pain, nausea, vomiting, diarrhea, change in bowel habits, loss of appetite, bloody stools.   Resp: No shortness of breath with exertion or at rest.  No excess mucus, no productive cough,  No non-productive cough,  No coughing up of blood.  No change in color of mucus.  No wheezing.  No chest wall deformity  Skin: no rash or lesions.  GU: no dysuria, change in color of urine, no urgency or frequency.  No flank pain, no hematuria   MS:  No joint pain or swelling.  No decreased range of motion.  No back pain.    Physical Exam  BP 130/70 (BP Location: Left Arm, Patient Position: Sitting, Cuff Size: Large)   Pulse 60   Temp 97.6 F (36.4 C) (Temporal)   Ht 5\' 8"  (1.727 m)   Wt 262 lb 3.2 oz (118.9 kg)   SpO2 96%   BMI 39.87 kg/m   GEN: A/Ox3; pleasant , NAD, well nourished    HEENT:  [place]/AT,   NOSE-clear, THROAT-clear, no lesions, no postnasal drip or exudate noted.   NECK:  Supple w/ fair ROM; no JVD; normal carotid impulses w/o bruits; no thyromegaly or nodules palpated; no lymphadenopathy.    RESP  Clear  P & A; w/o, wheezes/ rales/ or rhonchi. no accessory muscle use, no dullness to percussion  CARD:  RRR, no m/r/g, tr peripheral edema, pulses intact, no cyanosis or clubbing.  GI:   Soft & nt; nml bowel sounds; no organomegaly or masses detected.   Musco: Warm bil, no deformities or joint swelling noted.   Neuro: alert, no focal deficits noted.    Skin: Warm, no lesions or  rashes    Lab Results:  CBC         No data to display          No results found for: "NITRICOXIDE"      Assessment & Plan:   OSA (obstructive sleep apnea) Severe obstructive sleep apnea.  Patient education on sleep apnea.  Patient will proceed with CPAP therapy.  Will begin auto CPAP 5 to 15 cm H2O.  Mask of choice.  - discussed how weight can impact sleep and risk for sleep disordered breathing -  discussed options to assist with weight loss: combination of diet modification, cardiovascular and strength training exercises   - had an extensive discussion regarding the adverse health consequences related to untreated sleep disordered breathing - specifically discussed the risks for hypertension, coronary artery disease, cardiac dysrhythmias, cerebrovascular disease, and diabetes - lifestyle modification discussed   - discussed how sleep disruption can increase risk of accidents, particularly when driving - safe driving practices were discussed   Plan  Patient Instructions  Begin CPAP At bedtime, wear all night long for at least 6hr or more  Work on healthy weight loss Do not drive if sleepy Follow up in 3 months and As needed  -Virtual or In person       Marathon Oil, NP 01/17/2023

## 2023-01-17 NOTE — Assessment & Plan Note (Signed)
Severe obstructive sleep apnea.  Patient education on sleep apnea.  Patient will proceed with CPAP therapy.  Will begin auto CPAP 5 to 15 cm H2O.  Mask of choice.  - discussed how weight can impact sleep and risk for sleep disordered breathing - discussed options to assist with weight loss: combination of diet modification, cardiovascular and strength training exercises   - had an extensive discussion regarding the adverse health consequences related to untreated sleep disordered breathing - specifically discussed the risks for hypertension, coronary artery disease, cardiac dysrhythmias, cerebrovascular disease, and diabetes - lifestyle modification discussed   - discussed how sleep disruption can increase risk of accidents, particularly when driving - safe driving practices were discussed   Plan  Patient Instructions  Begin CPAP At bedtime, wear all night long for at least 6hr or more  Work on healthy weight loss Do not drive if sleepy Follow up in 3 months and As needed  -Virtual or In person

## 2023-01-17 NOTE — Patient Instructions (Signed)
Begin CPAP At bedtime, wear all night long for at least 6hr or more  Work on healthy weight loss Do not drive if sleepy Follow up in 3 months and As needed  -Virtual or In person

## 2023-01-27 ENCOUNTER — Other Ambulatory Visit: Payer: Self-pay

## 2023-01-27 ENCOUNTER — Emergency Department
Admission: EM | Admit: 2023-01-27 | Discharge: 2023-01-27 | Disposition: A | Discharge status: Home / Self Care | Payer: Medicare PPO | Attending: Emergency Medicine | Admitting: Emergency Medicine

## 2023-01-27 ENCOUNTER — Emergency Department: Payer: Medicare PPO

## 2023-01-27 ENCOUNTER — Other Ambulatory Visit: Payer: Self-pay | Admitting: Physician Assistant

## 2023-01-27 DIAGNOSIS — Z23 Encounter for immunization: Secondary | ICD-10-CM | POA: Insufficient documentation

## 2023-01-27 DIAGNOSIS — S0101XA Laceration without foreign body of scalp, initial encounter: Secondary | ICD-10-CM | POA: Insufficient documentation

## 2023-01-27 DIAGNOSIS — G238 Other specified degenerative diseases of basal ganglia: Secondary | ICD-10-CM | POA: Diagnosis not present

## 2023-01-27 DIAGNOSIS — R220 Localized swelling, mass and lump, head: Secondary | ICD-10-CM | POA: Diagnosis not present

## 2023-01-27 DIAGNOSIS — W228XXA Striking against or struck by other objects, initial encounter: Secondary | ICD-10-CM | POA: Diagnosis not present

## 2023-01-27 DIAGNOSIS — S0990XA Unspecified injury of head, initial encounter: Secondary | ICD-10-CM | POA: Diagnosis not present

## 2023-01-27 MED ORDER — TETANUS-DIPHTH-ACELL PERTUSSIS 5-2.5-18.5 LF-MCG/0.5 IM SUSY
0.5000 mL | PREFILLED_SYRINGE | Freq: Once | INTRAMUSCULAR | Status: AC
  Administered 2023-01-27: 0.5 mL via INTRAMUSCULAR
  Filled 2023-01-27: qty 0.5

## 2023-01-27 NOTE — Discharge Instructions (Signed)
CT imaging was negative.  Return to the ER for worsening symptoms or any other concerns.  Follow-up with your primary care doctor for staple removal in 10 to 14 days.

## 2023-01-27 NOTE — ED Notes (Signed)
Patient transported to CT 

## 2023-01-27 NOTE — ED Notes (Signed)
Pt reports closing his trunk of his car and hitting the top of his head. No LOC. No use of blood thinners.

## 2023-01-27 NOTE — ED Provider Notes (Signed)
[facility] Provider Note    Event Date/Time   First MD Initiated Contact with Patient 01/27/23 1108     (approximate)   History   Laceration   HPI  [name] is a 82 y.o. male who was getting ready for church and was getting out of his car when he hit his head very hard on the car door and has a laceration to the top of the head.  He denies any LOC.  Denies any falling to the ground or neck pain.  Denies any other injuries.  Unclear of his last tetanus shot.  Denies any pain at this time.  He is not on a blood thinner.   Physical Exam   Triage Vital Signs: ED Triage Vitals  Encounter Vitals Group     BP 01/27/23 0937 (!) 155/72     Systolic BP Percentile --      Diastolic BP Percentile --      Pulse Rate 01/27/23 0937 71     Resp 01/27/23 0937 18     Temp 01/27/23 0937 97.8 F (36.6 C)     Temp Source 01/27/23 0937 Oral     SpO2 01/27/23 0937 94 %     Weight 01/27/23 0940 262 lb 5.6 oz (119 kg)     Height 01/27/23 0940 5\' 8"  (1.727 m)     Head Circumference --      Peak Flow --      Pain Score 01/27/23 0940 2     Pain Loc --      Pain Education --      Exclude from Growth Chart --     Most recent vital signs: Vitals:   01/27/23 0937 01/27/23 1108  BP: (!) 155/72 (!) 149/66  Pulse: 71 (!) 57  Resp: 18 18  Temp: 97.8 F (36.6 C)   SpO2: 94% 98%     General: Awake, no distress.  CV:  Good peripheral perfusion.  Resp:  Normal effort.  Abd:  No distention.  Other:  Has about a 2 cm laceration noted to the top of his head.   ED Results / Procedures / Treatments   Labs (all labs ordered are listed, but only abnormal results are displayed) Labs Reviewed - No data to display  RADIOLOGY I have reviewed the CT head personally interpreted no evidence of intracranial hemorrhage PROCEDURES:  Critical Care performed: No  ..Laceration Repair  Date/Time: 01/27/2023 11:54 AM  Performed by: Concha Se, MD Authorized by:  Concha Se, MD   Consent:    Consent obtained:  Verbal   Consent given by:  Patient   Risks discussed:  Infection, pain, retained foreign body, need for additional repair, poor cosmetic result, tendon damage and nerve damage   Alternatives discussed:  No treatment Universal protocol:    Patient identity confirmed:  Verbally with patient Anesthesia:    Anesthesia method:  None Laceration details:    Location:  Scalp   Length (cm):  2 Treatment:    Area cleansed with:  Saline   Irrigation method:  Syringe   Visualized foreign bodies/material removed: no   Skin repair:    Repair method:  Staples   Number of staples:  2 Approximation:    Approximation:  Close Repair type:    Repair type:  Simple Post-procedure details:    Dressing:  Open (no dressing)   Procedure completion:  Tolerated well, no immediate complications    MEDICATIONS ORDERED IN ED:  Medications  Tdap (BOOSTRIX) injection 0.5 mL (0.5 mLs Intramuscular Given 01/27/23 1123)     IMPRESSION / MDM / ASSESSMENT AND PLAN / ED COURSE  I reviewed the triage vital signs and the nursing notes.   Patient's presentation is most consistent with acute, uncomplicated illness.   Patient comes in with head trauma with laceration differential includes intracranial hemorrhage, concussion, laceration.  I reviewed patient's office visit from 01/17/2023 where his area of his pulse was 60.  Heart rate noted to be slightly low at 57 but he denies any dizziness, lightheadedness, chest pain, syncope and this is around his baseline heart rate.  This was a mechanical event and was not syncopal do not need to get blood work.  Laceration was repaired with 2 staples CT imaging negative patient denies any pain or discomfort and denies any other injuries and feels comfortable with discharge home  FINAL CLINICAL IMPRESSION(S) / ED DIAGNOSES   Final diagnoses:  Injury of head, initial encounter  Laceration of scalp without foreign body,  initial encounter     Rx / DC Orders   ED Discharge Orders     None        Note:  This document was prepared using Dragon voice recognition software and may include unintentional dictation errors.   Concha Se, MD 01/27/23 [phone]

## 2023-01-27 NOTE — ED Notes (Signed)
Pt verbalizes understanding of discharge instructions. Opportunity for questioning and answers were provided. Pt discharged from ED to home with wife.    

## 2023-01-27 NOTE — ED Triage Notes (Signed)
Pt reports was getting something out of his car this am and hit the top of his hear on the corner of the trunk and it cut him. Pt denies LOC or blood thinners. Laceration noted to top of head, bleeding controlled.

## 2023-01-28 ENCOUNTER — Encounter: Payer: Self-pay | Admitting: Family Medicine

## 2023-01-28 ENCOUNTER — Encounter: Payer: Self-pay | Admitting: Urology

## 2023-01-28 MED ORDER — TAMSULOSIN HCL 0.4 MG PO CAPS: 0.4000 mg | ORAL_CAPSULE | Freq: Every day | ORAL | 3 refills | Status: DC

## 2023-01-28 NOTE — Telephone Encounter (Signed)
Patient is scheduled for 02/08/23 at 11:20 for staple removal.

## 2023-02-01 DIAGNOSIS — G4733 Obstructive sleep apnea (adult) (pediatric): Secondary | ICD-10-CM | POA: Diagnosis not present

## 2023-02-08 ENCOUNTER — Ambulatory Visit: Payer: Medicare PPO | Admitting: Family Medicine

## 2023-02-08 ENCOUNTER — Encounter: Payer: Self-pay | Admitting: Family Medicine

## 2023-02-08 VITALS — BP 134/80 | HR 69 | Temp 97.5°F | Ht 68.0 in | Wt 260.2 lb

## 2023-02-08 DIAGNOSIS — S0101XA Laceration without foreign body of scalp, initial encounter: Secondary | ICD-10-CM | POA: Diagnosis not present

## 2023-02-08 NOTE — Assessment & Plan Note (Signed)
Well-healing.  2 staples identified.  Area was cleansed with an alcohol swab.  2 staples were removed and the area was cleansed with an alcohol swab.  Minimal bleeding with staple removal.  This appeared to have stopped prior to patient leaving the office.  Advised to monitor for signs of infection or bleeding.

## 2023-02-08 NOTE — Progress Notes (Signed)
  Marikay Alar, MD Phone: [phone]  [name] is a 82 y.o. male who presents today for staple removal  Head injury: Patient notes his trunk door hit the top of his head.  He went to the ED and had 2 staples placed.  Currently has no pain.  He has had no bleeding, fever, or drainage.  Social History   Tobacco Use  Smoking Status Never  Smokeless Tobacco Never    Current Outpatient Medications on File Prior to Visit  Medication Sig Dispense Refill   acetaminophen (TYLENOL) 500 MG tablet Take 500 mg by mouth every 6 (six) hours as needed.     aspirin 81 MG tablet Take 81 mg by mouth daily.     Bioflavonoid Products (PERIDIN-C PO) Take by mouth.     carvedilol (COREG) 6.25 MG tablet TAKE 1 TABLET BY MOUTH 2 TIMES DAILY WITH A MEAL. 180 tablet 1   fluticasone (FLONASE) 50 MCG/ACT nasal spray Place into both nostrils daily.     MITIGARE 0.6 MG CAPS TAKE 0.6 MG BY MOUTH DAILY AS NEEDED (GOUT FLARE). 90 capsule 1   Multiple Vitamins-Minerals (CENTRUM SILVER 50+MEN PO) Take by mouth.     NON FORMULARY Clindamycin 150 mg     pantoprazole (PROTONIX) 40 MG tablet Take 1 tablet (40 mg total) by mouth 2 (two) times daily. 180 tablet 3   polyethylene glycol (MIRALAX / GLYCOLAX) 17 g packet Take 17 g by mouth daily.     tamsulosin (FLOMAX) 0.4 MG CAPS capsule Take 1 capsule (0.4 mg total) by mouth daily. 90 capsule 3   valsartan (DIOVAN) 320 MG tablet TAKE 1 TABLET BY MOUTH EVERY DAY 90 tablet 1   No current facility-administered medications on file prior to visit.     ROS see history of present illness  Objective  Physical Exam Vitals:   02/08/23 1119 02/08/23 1138  BP: (!) 140/80 134/80  Pulse: 69   Temp: (!) 97.5 F (36.4 C)   SpO2: 97%     BP Readings from Last 3 Encounters:  02/08/23 134/80  01/27/23 (!) 149/66  01/17/23 130/70   Wt Readings from Last 3 Encounters:  02/08/23 260 lb 3.2 oz (118 kg)  01/27/23 262 lb 5.6 oz (119 kg)  01/17/23 262 lb 3.2 oz (118.9  kg)    Physical Exam HENT:     Head:       Assessment/Plan: Please see individual problem list.  Laceration of scalp, initial encounter Assessment & Plan: Well-healing.  2 staples identified.  Area was cleansed with an alcohol swab.  2 staples were removed and the area was cleansed with an alcohol swab.  Minimal bleeding with staple removal.  This appeared to have stopped prior to patient leaving the office.  Advised to monitor for signs of infection or bleeding.     Return if symptoms worsen or fail to improve.   Marikay Alar, MD [facility]

## 2023-02-13 DIAGNOSIS — M4302 Spondylolysis, cervical region: Secondary | ICD-10-CM | POA: Diagnosis not present

## 2023-02-13 DIAGNOSIS — M9901 Segmental and somatic dysfunction of cervical region: Secondary | ICD-10-CM | POA: Diagnosis not present

## 2023-02-13 DIAGNOSIS — M9903 Segmental and somatic dysfunction of lumbar region: Secondary | ICD-10-CM | POA: Diagnosis not present

## 2023-02-13 DIAGNOSIS — M9904 Segmental and somatic dysfunction of sacral region: Secondary | ICD-10-CM | POA: Diagnosis not present

## 2023-02-13 DIAGNOSIS — M531 Cervicobrachial syndrome: Secondary | ICD-10-CM | POA: Diagnosis not present

## 2023-02-13 DIAGNOSIS — M9905 Segmental and somatic dysfunction of pelvic region: Secondary | ICD-10-CM | POA: Diagnosis not present

## 2023-02-13 DIAGNOSIS — M4306 Spondylolysis, lumbar region: Secondary | ICD-10-CM | POA: Diagnosis not present

## 2023-02-13 DIAGNOSIS — M9902 Segmental and somatic dysfunction of thoracic region: Secondary | ICD-10-CM | POA: Diagnosis not present

## 2023-03-03 DIAGNOSIS — G4733 Obstructive sleep apnea (adult) (pediatric): Secondary | ICD-10-CM | POA: Diagnosis not present

## 2023-03-12 ENCOUNTER — Encounter: Payer: Medicare PPO | Admitting: Nurse Practitioner

## 2023-03-18 DIAGNOSIS — M9905 Segmental and somatic dysfunction of pelvic region: Secondary | ICD-10-CM | POA: Diagnosis not present

## 2023-03-18 DIAGNOSIS — M9903 Segmental and somatic dysfunction of lumbar region: Secondary | ICD-10-CM | POA: Diagnosis not present

## 2023-03-18 DIAGNOSIS — M9902 Segmental and somatic dysfunction of thoracic region: Secondary | ICD-10-CM | POA: Diagnosis not present

## 2023-03-18 DIAGNOSIS — M4306 Spondylolysis, lumbar region: Secondary | ICD-10-CM | POA: Diagnosis not present

## 2023-03-18 DIAGNOSIS — M9901 Segmental and somatic dysfunction of cervical region: Secondary | ICD-10-CM | POA: Diagnosis not present

## 2023-03-18 DIAGNOSIS — M531 Cervicobrachial syndrome: Secondary | ICD-10-CM | POA: Diagnosis not present

## 2023-03-18 DIAGNOSIS — M4302 Spondylolysis, cervical region: Secondary | ICD-10-CM | POA: Diagnosis not present

## 2023-03-18 DIAGNOSIS — M9904 Segmental and somatic dysfunction of sacral region: Secondary | ICD-10-CM | POA: Diagnosis not present

## 2023-03-19 ENCOUNTER — Encounter: Payer: Medicare PPO | Admitting: Nurse Practitioner

## 2023-04-03 ENCOUNTER — Encounter: Payer: Self-pay | Admitting: Urology

## 2023-04-03 DIAGNOSIS — G4733 Obstructive sleep apnea (adult) (pediatric): Secondary | ICD-10-CM | POA: Diagnosis not present

## 2023-04-04 NOTE — Telephone Encounter (Signed)
Spoke with patient and scheduled an appointment with Sam to evaluate

## 2023-04-05 ENCOUNTER — Encounter: Payer: Self-pay | Admitting: Physician Assistant

## 2023-04-05 ENCOUNTER — Ambulatory Visit: Payer: Medicare PPO | Admitting: Physician Assistant

## 2023-04-05 VITALS — BP 159/88 | HR 85 | Ht 68.0 in | Wt 250.0 lb

## 2023-04-05 DIAGNOSIS — R310 Gross hematuria: Secondary | ICD-10-CM

## 2023-04-05 DIAGNOSIS — N138 Other obstructive and reflux uropathy: Secondary | ICD-10-CM | POA: Diagnosis not present

## 2023-04-05 DIAGNOSIS — C61 Malignant neoplasm of prostate: Secondary | ICD-10-CM | POA: Diagnosis not present

## 2023-04-05 DIAGNOSIS — N401 Enlarged prostate with lower urinary tract symptoms: Secondary | ICD-10-CM | POA: Diagnosis not present

## 2023-04-05 LAB — URINALYSIS, COMPLETE
Bilirubin, UA: NEGATIVE
Glucose, UA: NEGATIVE
Leukocytes,UA: NEGATIVE
Nitrite, UA: NEGATIVE
Protein,UA: NEGATIVE
RBC, UA: NEGATIVE
Specific Gravity, UA: 1.025 (ref 1.005–1.030)
Urobilinogen, Ur: 0.2 mg/dL (ref 0.2–1.0)
pH, UA: 5 (ref 5.0–7.5)

## 2023-04-05 LAB — BLADDER SCAN AMB NON-IMAGING

## 2023-04-05 LAB — MICROSCOPIC EXAMINATION: Bacteria, UA: NONE SEEN

## 2023-04-05 NOTE — Progress Notes (Unsigned)
04/05/2023 3:25 PM   [name] Sep 07, 1940 [id number]  CC: Chief Complaint  Patient presents with   Follow-up   Hematuria   HPI: [name] is a 83 y.o. male with PMH prostate cancer s/p IMRT on adjuvant ADT, BPH s/p HOLEP and cystolitholapaxy in 2019, gross hematuria due to dystrophic calcifications, and bulbar urethral stricture s/p dilation in 2020 who presents today for evaluation of gross hematuria.   Today he reports occasional gross hematuria since undergoing HOLEP about 6 years ago, however this has been more frequent within the past month.  It usually occurs with termination.  He has been passing small clots.  Additionally, he has noticed some increased sensitivity of his bilateral testes without swelling.  He denies fever, chills, nausea, vomiting, flank pain, or dysuria.  He is a never smoker.  Last recent cystoscopy dated 09/28/2020 with Dr. Apolinar Junes with some mild prostatic regrowth on the right and mildly friable vessels, otherwise unremarkable.  In-office UA today positive for trace ketones; urine microscopy pan negative.  PVR 0 mL.  PMH: Past Medical History:  Diagnosis Date   Allergy    Anxiety    Arthritis    Cataract    beginning stage bilateral   Diverticulosis    Elevated PSA    Esophageal stricture    Esophagitis    GERD (gastroesophageal reflux disease)    Gout    History of kidney stones    Hyperlipidemia    Hypertension    Inguinal hernia    Internal hemorrhoids    Pancreatitis    Prostate cancer (HCC)    Tubular adenoma of colon     Surgical History: Past Surgical History:  Procedure Laterality Date   BACK SURGERY  1980's   L4/5   BALLOON DILATION N/A 11/17/2018   Procedure: URETHRAL BALLOON DILATION;  Surgeon: Vanna Scotland, MD;  Location: [facility];  Service: Urology;  Laterality: N/A;   CARPAL TUNNEL RELEASE Right 08/15/2016   Procedure: CARPAL TUNNEL RELEASE ENDOSCOPIC;  Surgeon: Christena Flake, MD;  Location: [facility];  Service: Orthopedics;  Laterality: Right;   CATARACT EXTRACTION W/PHACO Right 03/14/2021   Procedure: CATARACT EXTRACTION PHACO AND INTRAOCULAR LENS PLACEMENT (IOC) RIGHT 6.84 00:52.3;  Surgeon: Galen Manila, MD;  Location: [facility];  Service: Ophthalmology;  Laterality: Right;   CATARACT EXTRACTION W/PHACO Left 03/28/2021   Procedure: CATARACT EXTRACTION PHACO AND INTRAOCULAR LENS PLACEMENT (IOC) LEFT;  Surgeon: Galen Manila, MD;  Location: [facility];  Service: Ophthalmology;  Laterality: Left;   COLONOSCOPY     CYSTOSCOPY N/A 11/17/2018   Procedure: CYSTOSCOPY;  Surgeon: Vanna Scotland, MD;  Location: [facility];  Service: Urology;  Laterality: N/A;   CYSTOSCOPY WITH LITHOLAPAXY N/A 10/09/2017   Procedure: CYSTOSCOPY WITH LITHOLAPAXY;  Surgeon: Vanna Scotland, MD;  Location: [facility];  Service: Urology;  Laterality: N/A;   ESOPHAGOGASTRODUODENOSCOPY  11-21-06   Esophagitis, Stricture/dilated    HOLEP-LASER ENUCLEATION OF THE PROSTATE WITH MORCELLATION N/A 10/09/2017   Procedure: HOLEP-LASER ENUCLEATION OF THE PROSTATE WITH MORCELLATION;  Surgeon: Vanna Scotland, MD;  Location: [facility];  Service: Urology;  Laterality: N/A;   LYMPH NODE BIOPSY  1957   benign  lymph node removed   Right arm fracture     TONSILLECTOMY     TOTAL KNEE ARTHROPLASTY  09-02-02   Bilateral   UPPER GASTROINTESTINAL ENDOSCOPY      Home Medications:  Allergies as of 04/05/2023       Reactions   Penicillins  Rash   Did it involve swelling of the face/tongue/throat, SOB, or low BP? No Did it involve sudden or severe rash/hives, skin peeling, or any reaction on the inside of your mouth or nose? No Did you need to seek medical attention at a hospital or doctor's office? No When did it last happen?      50 Years If all above answers are "NO", may proceed with cephalosporin use.        Medication List        Accurate as of April 05, 2023  3:25 PM. If you have any questions, ask  your nurse or doctor.          STOP taking these medications    NON FORMULARY Stopped by: Carman Ching       TAKE these medications    acetaminophen 500 MG tablet Commonly known as: TYLENOL Take 500 mg by mouth every 6 (six) hours as needed.   aspirin 81 MG tablet Take 81 mg by mouth daily.   carvedilol 6.25 MG tablet Commonly known as: COREG TAKE 1 TABLET BY MOUTH 2 TIMES DAILY WITH A MEAL.   CENTRUM SILVER 50+MEN PO Take by mouth.   fluticasone 50 MCG/ACT nasal spray Commonly known as: FLONASE Place into both nostrils daily.   Mitigare 0.6 MG Caps Generic drug: Colchicine TAKE 0.6 MG BY MOUTH DAILY AS NEEDED (GOUT FLARE).   pantoprazole 40 MG tablet Commonly known as: PROTONIX Take 1 tablet (40 mg total) by mouth 2 (two) times daily.   PERIDIN-C PO Take by mouth.   polyethylene glycol 17 g packet Commonly known as: MIRALAX / GLYCOLAX Take 17 g by mouth daily.   tamsulosin 0.4 MG Caps capsule Commonly known as: FLOMAX Take 1 capsule (0.4 mg total) by mouth daily.   valsartan 320 MG tablet Commonly known as: DIOVAN TAKE 1 TABLET BY MOUTH EVERY DAY        Allergies:  Allergies  Allergen Reactions   Penicillins Rash    Did it involve swelling of the face/tongue/throat, SOB, or low BP? No Did it involve sudden or severe rash/hives, skin peeling, or any reaction on the inside of your mouth or nose? No Did you need to seek medical attention at a hospital or doctor's office? No When did it last happen?      50 Years If all above answers are "NO", may proceed with cephalosporin use.     Family History: Family History  Problem Relation Age of Onset   Ulcers Father    Diverticulitis Father    Arthritis Father    Hypertension Brother    Obesity Brother    Cancer Brother        throat   Arthritis Mother    Depression Mother        Anxiety   Colon cancer Neg Hx    Colon polyps Neg Hx    Esophageal cancer Neg Hx    Rectal cancer Neg  Hx    Stomach cancer Neg Hx    Kidney disease Neg Hx    Kidney cancer Neg Hx    Prostate cancer Neg Hx     Social History:   reports that he has never smoked. He has never been exposed to tobacco smoke. He has never used smokeless tobacco. He reports that he does not drink alcohol and does not use drugs.  Physical Exam: BP (!) 159/88   Pulse 85   Ht 5\' 8"  (1.727 m)   Wt 250  lb (113.4 kg)   BMI 38.01 kg/m   Constitutional:  Alert and oriented, no acute distress, nontoxic appearing HEENT: [place], AT Cardiovascular: No clubbing, cyanosis, or edema Respiratory: Normal respiratory effort, no increased work of breathing Skin: No rashes, bruises or suspicious lesions Neurologic: Grossly intact, no focal deficits, moving all 4 extremities Psychiatric: Normal mood and affect  Laboratory Data: Results for orders placed or performed in visit on 04/05/23  Microscopic Examination   Collection Time: 04/05/23  2:40 PM   Urine  Result Value Ref Range   WBC, UA 0-5 0 - 5 /hpf   RBC, Urine 0-2 0 - 2 /hpf   Epithelial Cells (non renal) 0-10 0 - 10 /hpf   Bacteria, UA None seen None seen/Few  Urinalysis, Complete   Collection Time: 04/05/23  2:40 PM  Result Value Ref Range   Specific Gravity, UA 1.025 1.005 - 1.030   pH, UA 5.0 5.0 - 7.5   Color, UA Yellow Yellow   Appearance Ur Clear Clear   Leukocytes,UA Negative Negative   Protein,UA Negative Negative/Trace   Glucose, UA Negative Negative   Ketones, UA Trace (A) Negative   RBC, UA Negative Negative   Bilirubin, UA Negative Negative   Urobilinogen, Ur 0.2 0.2 - 1.0 mg/dL   Nitrite, UA Negative Negative   Microscopic Examination See below:   BLADDER SCAN AMB NON-IMAGING   Collection Time: 04/05/23  2:45 PM  Result Value Ref Range   Scan Result 0ml    Assessment & Plan:   1. Gross hematuria (Primary) Recent worsening in gross hematuria, last hematuria workup about 2 years ago.  UA is bland, low suspicion for UTI today.  Will pursue  CTU and cystoscopy with Dr. Apolinar Junes.  He is in agreement with this plan. - Urinalysis, Complete - BLADDER SCAN AMB NON-IMAGING - CT HEMATURIA WORKUP; Future  Return in about 4 weeks (around 05/03/2023) for Cysto and CTU results.  Carman Ching, PA-C  [facility] [place] [address], [address] [place], [place] [address] [phone]

## 2023-04-05 NOTE — Patient Instructions (Signed)

## 2023-04-09 DIAGNOSIS — M1000 Idiopathic gout, unspecified site: Secondary | ICD-10-CM | POA: Diagnosis not present

## 2023-04-09 DIAGNOSIS — N281 Cyst of kidney, acquired: Secondary | ICD-10-CM | POA: Diagnosis not present

## 2023-04-09 DIAGNOSIS — Z8546 Personal history of malignant neoplasm of prostate: Secondary | ICD-10-CM | POA: Diagnosis not present

## 2023-04-09 DIAGNOSIS — N1832 Chronic kidney disease, stage 3b: Secondary | ICD-10-CM | POA: Diagnosis not present

## 2023-04-09 DIAGNOSIS — N200 Calculus of kidney: Secondary | ICD-10-CM | POA: Diagnosis not present

## 2023-04-09 DIAGNOSIS — R809 Proteinuria, unspecified: Secondary | ICD-10-CM | POA: Diagnosis not present

## 2023-04-09 DIAGNOSIS — R3129 Other microscopic hematuria: Secondary | ICD-10-CM | POA: Diagnosis not present

## 2023-04-09 DIAGNOSIS — I10 Essential (primary) hypertension: Secondary | ICD-10-CM | POA: Diagnosis not present

## 2023-04-09 DIAGNOSIS — N400 Enlarged prostate without lower urinary tract symptoms: Secondary | ICD-10-CM | POA: Diagnosis not present

## 2023-04-09 DIAGNOSIS — C61 Malignant neoplasm of prostate: Secondary | ICD-10-CM | POA: Diagnosis not present

## 2023-04-09 DIAGNOSIS — N35919 Unspecified urethral stricture, male, unspecified site: Secondary | ICD-10-CM | POA: Diagnosis not present

## 2023-04-17 DIAGNOSIS — M9905 Segmental and somatic dysfunction of pelvic region: Secondary | ICD-10-CM | POA: Diagnosis not present

## 2023-04-17 DIAGNOSIS — M531 Cervicobrachial syndrome: Secondary | ICD-10-CM | POA: Diagnosis not present

## 2023-04-17 DIAGNOSIS — M4302 Spondylolysis, cervical region: Secondary | ICD-10-CM | POA: Diagnosis not present

## 2023-04-17 DIAGNOSIS — M9902 Segmental and somatic dysfunction of thoracic region: Secondary | ICD-10-CM | POA: Diagnosis not present

## 2023-04-17 DIAGNOSIS — M9904 Segmental and somatic dysfunction of sacral region: Secondary | ICD-10-CM | POA: Diagnosis not present

## 2023-04-17 DIAGNOSIS — M9901 Segmental and somatic dysfunction of cervical region: Secondary | ICD-10-CM | POA: Diagnosis not present

## 2023-04-17 DIAGNOSIS — M9903 Segmental and somatic dysfunction of lumbar region: Secondary | ICD-10-CM | POA: Diagnosis not present

## 2023-04-17 DIAGNOSIS — M4306 Spondylolysis, lumbar region: Secondary | ICD-10-CM | POA: Diagnosis not present

## 2023-04-19 ENCOUNTER — Ambulatory Visit
Admission: RE | Admit: 2023-04-19 | Discharge: 2023-04-19 | Disposition: A | Discharge status: Home / Self Care | Payer: Medicare PPO | Source: Ambulatory Visit | Attending: Physician Assistant | Admitting: Physician Assistant

## 2023-04-19 DIAGNOSIS — K7689 Other specified diseases of liver: Secondary | ICD-10-CM | POA: Diagnosis not present

## 2023-04-19 DIAGNOSIS — D7389 Other diseases of spleen: Secondary | ICD-10-CM | POA: Diagnosis not present

## 2023-04-19 DIAGNOSIS — K5730 Diverticulosis of large intestine without perforation or abscess without bleeding: Secondary | ICD-10-CM | POA: Diagnosis not present

## 2023-04-19 DIAGNOSIS — R310 Gross hematuria: Secondary | ICD-10-CM | POA: Diagnosis not present

## 2023-04-19 DIAGNOSIS — N400 Enlarged prostate without lower urinary tract symptoms: Secondary | ICD-10-CM | POA: Diagnosis not present

## 2023-04-19 MED ORDER — IOHEXOL 300 MG/ML  SOLN
100.0000 mL | Freq: Once | INTRAMUSCULAR | Status: AC | PRN
  Administered 2023-04-19: 100 mL via INTRAVENOUS

## 2023-04-26 ENCOUNTER — Ambulatory Visit: Payer: Medicare PPO | Admitting: Adult Health

## 2023-05-03 DIAGNOSIS — G4733 Obstructive sleep apnea (adult) (pediatric): Secondary | ICD-10-CM | POA: Diagnosis not present

## 2023-05-06 ENCOUNTER — Ambulatory Visit: Payer: Medicare PPO | Attending: Cardiology | Admitting: Cardiology

## 2023-05-06 ENCOUNTER — Encounter: Payer: Self-pay | Admitting: Cardiology

## 2023-05-06 VITALS — BP 138/78 | HR 57 | Ht 68.0 in | Wt 261.2 lb

## 2023-05-06 DIAGNOSIS — I77810 Thoracic aortic ectasia: Secondary | ICD-10-CM

## 2023-05-06 DIAGNOSIS — R0602 Shortness of breath: Secondary | ICD-10-CM

## 2023-05-06 DIAGNOSIS — Z6839 Body mass index (BMI) 39.0-39.9, adult: Secondary | ICD-10-CM | POA: Diagnosis not present

## 2023-05-06 DIAGNOSIS — I10 Essential (primary) hypertension: Secondary | ICD-10-CM

## 2023-05-06 NOTE — Progress Notes (Signed)
 Cardiology Office Note:    Date:  05/06/2023   ID:  [name], DOB April 13, 1940, MRN [id number]  PCP:  [name], NP   [facility] Providers Cardiologist:  Debbe Odea, MD     Referring MD: Glori Luis, MD   Chief Complaint  Patient presents with   Follow-up    5 month follow up . Patient states that he feels week all time. Meds reviewed.     History of Present Illness:    [name] is a 83 y.o. male with a hx of hypertension, GERD, OSA on CPAP, prostate cancer s/p radiation therapy who presents for follow-up.  Endorse fatigue with exertion.  Complains of hematuria, has appointment with urology next week.  Uses CPAP mask sparingly, currently about 2 to 3 hours nightly.  States feeling more rested when compliant with CPAP.  Takes an antiandrogen for prostate cancer treatment.  Was told fatigue was a common side effect.  Hoping symptoms will improve after taking last dose of antiandrogen roughly 4 months ago.    Prior notes Echo 9/24 EF 55 to 60%, mild ascending aorta dilatation 40 mm. Norvasc caused leg edema, patient switched to carvedilol  Past Medical History:  Diagnosis Date   Allergy    Anxiety    Arthritis    Cataract    beginning stage bilateral   Diverticulosis    Elevated PSA    Esophageal stricture    Esophagitis    GERD (gastroesophageal reflux disease)    Gout    History of kidney stones    Hyperlipidemia    Hypertension    Inguinal hernia    Internal hemorrhoids    Pancreatitis    Prostate cancer (HCC)    Tubular adenoma of colon     Past Surgical History:  Procedure Laterality Date   BACK SURGERY  1980's   L4/5   BALLOON DILATION N/A 11/17/2018   Procedure: URETHRAL BALLOON DILATION;  Surgeon: Vanna Scotland, MD;  Location: [facility];  Service: Urology;  Laterality: N/A;   CARPAL TUNNEL RELEASE Right 08/15/2016   Procedure: CARPAL TUNNEL RELEASE ENDOSCOPIC;  Surgeon: Christena Flake, MD;  Location: [facility];   Service: Orthopedics;  Laterality: Right;   CATARACT EXTRACTION W/PHACO Right 03/14/2021   Procedure: CATARACT EXTRACTION PHACO AND INTRAOCULAR LENS PLACEMENT (IOC) RIGHT 6.84 00:52.3;  Surgeon: Galen Manila, MD;  Location: [facility];  Service: Ophthalmology;  Laterality: Right;   CATARACT EXTRACTION W/PHACO Left 03/28/2021   Procedure: CATARACT EXTRACTION PHACO AND INTRAOCULAR LENS PLACEMENT (IOC) LEFT;  Surgeon: Galen Manila, MD;  Location: [facility];  Service: Ophthalmology;  Laterality: Left;   COLONOSCOPY     CYSTOSCOPY N/A 11/17/2018   Procedure: CYSTOSCOPY;  Surgeon: Vanna Scotland, MD;  Location: [facility];  Service: Urology;  Laterality: N/A;   CYSTOSCOPY WITH LITHOLAPAXY N/A 10/09/2017   Procedure: CYSTOSCOPY WITH LITHOLAPAXY;  Surgeon: Vanna Scotland, MD;  Location: [facility];  Service: Urology;  Laterality: N/A;   ESOPHAGOGASTRODUODENOSCOPY  11-21-06   Esophagitis, Stricture/dilated    HOLEP-LASER ENUCLEATION OF THE PROSTATE WITH MORCELLATION N/A 10/09/2017   Procedure: HOLEP-LASER ENUCLEATION OF THE PROSTATE WITH MORCELLATION;  Surgeon: Vanna Scotland, MD;  Location: [facility];  Service: Urology;  Laterality: N/A;   LYMPH NODE BIOPSY  1957   benign  lymph node removed   Right arm fracture     TONSILLECTOMY     TOTAL KNEE ARTHROPLASTY  09-02-02   Bilateral   UPPER GASTROINTESTINAL ENDOSCOPY  Current Medications: Current Meds  Medication Sig   acetaminophen (TYLENOL) 500 MG tablet Take 500 mg by mouth every 6 (six) hours as needed.   aspirin 81 MG tablet Take 81 mg by mouth daily.   Bioflavonoid Products (PERIDIN-C PO) Take by mouth.   carvedilol (COREG) 6.25 MG tablet TAKE 1 TABLET BY MOUTH 2 TIMES DAILY WITH A MEAL.   fluticasone (FLONASE) 50 MCG/ACT nasal spray Place into both nostrils daily.   MITIGARE 0.6 MG CAPS TAKE 0.6 MG BY MOUTH DAILY AS NEEDED (GOUT FLARE).   Multiple Vitamins-Minerals (CENTRUM SILVER 50+MEN PO) Take by mouth.    pantoprazole (PROTONIX) 40 MG tablet Take 1 tablet (40 mg total) by mouth 2 (two) times daily.   polyethylene glycol (MIRALAX / GLYCOLAX) 17 g packet Take 17 g by mouth daily.   tamsulosin (FLOMAX) 0.4 MG CAPS capsule Take 1 capsule (0.4 mg total) by mouth daily.   valsartan (DIOVAN) 320 MG tablet TAKE 1 TABLET BY MOUTH EVERY DAY     Allergies:   Penicillins   Social History   Socioeconomic History   Marital status: Married    Spouse name: Not on file   Number of children: 1   Years of education: Not on file   Highest education level: Bachelor's degree (e.g., BA, AB, BS)  Occupational History   Occupation: Retired Designer, jewellery: [employer]    Comment: sales floors   Tobacco Use   Smoking status: Never    Passive exposure: Never   Smokeless tobacco: Never  Vaping Use   Vaping status: Never Used  Substance and Sexual Activity   Alcohol use: No   Drug use: No   Sexual activity: Yes  Other Topics Concern   Not on file  Social History Narrative   Lives in [place] with wife. From [place], [place]. 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 Daughter, [name], lives in [place]. No pets.      Exercise: regular   Diet: increased sweets   Social Drivers of Corporate investment banker Strain: Low Risk  (10/26/2022)   Overall Financial Resource Strain (CARDIA)    Difficulty of Paying Living Expenses: Not hard at all  Food Insecurity: No Food Insecurity (10/26/2022)   Hunger Vital Sign    Worried About Running Out of Food in the Last Year: Never true    Ran Out of Food in the Last Year: Never true  Transportation Needs: No Transportation Needs (10/26/2022)   PRAPARE - Administrator, Civil Service (Medical): No    Lack of Transportation (Non-Medical): No  Physical Activity: Inactive (10/26/2022)   Exercise Vital Sign    Days of Exercise per Week: 0 days    Minutes of Exercise per Session: 0 min  Stress: No Stress Concern Present (10/26/2022)   [employer] of Occupational Health - Occupational Stress Questionnaire    Feeling of Stress : Only a little  Social Connections: Socially Integrated (10/26/2022)   Social Connection and Isolation Panel [NHANES]    Frequency of Communication with Friends and Family: More than three times a week    Frequency of Social Gatherings with Friends and Family: More than three times a week    Attends Religious Services: More than 4 times per year    Active Member of [employer] or Organizations: Yes    Attends Engineer, structural: More than 4 times per year    Marital Status: Married     Family  History: The patient's family history includes Arthritis in his father and mother; Cancer in his brother; Depression in his mother; Diverticulitis in his father; Hypertension in his brother; Obesity in his brother; Ulcers in his father. There is no history of Colon cancer, Colon polyps, Esophageal cancer, Rectal cancer, Stomach cancer, Kidney disease, Kidney cancer, or Prostate cancer.  ROS:   Please see the history of present illness.     All other systems reviewed and are negative.  EKGs/Labs/Other Studies Reviewed:    The following studies were reviewed today:  EKG Interpretation Date/Time:  Monday May 06 2023 11:41:43 EST Ventricular Rate:  57 PR Interval:  158 QRS Duration:  74 QT Interval:  430 QTC Calculation: 418 R Axis:   -34  Text Interpretation: Sinus bradycardia with marked sinus arrhythmia Left axis deviation Minimal voltage criteria for LVH, may be normal variant ( R in aVL ) Confirmed by Debbe Odea ([id number]) on 05/06/2023 11:43:48 AM    Recent Labs: 08/10/2022: ALT 21; TSH 2.56 09/10/2022: BUN 25; Creatinine, Ser 1.54; Potassium 4.8; Sodium 141 10/15/2022: Hemoglobin 12.6; Platelets 217.0  Recent Lipid Panel    Component Value Date/Time   CHOL 177 03/16/2022 1042   CHOL 118 08/31/2013 0401   TRIG 160.0 (H) 03/16/2022 1042   TRIG 59 08/31/2013 0401   HDL 36.20 (L) 03/16/2022  6045   HDL 33 (L) 08/31/2013 0401   CHOLHDL 5 03/16/2022 1042   VLDL 32.0 03/16/2022 1042   VLDL 12 08/31/2013 0401   LDLCALC 109 (H) 03/16/2022 1042   LDLCALC 73 08/31/2013 0401   LDLDIRECT 104.5 05/07/2011 1415     Risk Assessment/Calculations:             Physical Exam:    VS:  BP 138/78   Pulse (!) 57   Ht 5\' 8"  (1.727 m)   Wt 261 lb 3.2 oz (118.5 kg)   SpO2 95%   BMI 39.72 kg/m     Wt Readings from Last 3 Encounters:  05/06/23 261 lb 3.2 oz (118.5 kg)  04/05/23 250 lb (113.4 kg)  02/08/23 260 lb 3.2 oz (118 kg)     GEN:  Well nourished, well developed in no acute distress HEENT: Normal NECK: No JVD; No carotid bruits CARDIAC: RRR, no murmurs, rubs, gallops RESPIRATORY:  Clear to auscultation without rales, wheezing or rhonchi  ABDOMEN: Soft, non-tender, non-distended MUSCULOSKELETAL:  No edema; No deformity  SKIN: Warm and dry NEUROLOGIC:  Alert and oriented x 3 PSYCHIATRIC:  Normal affect   ASSESSMENT:    1. SOB (shortness of breath)   2. Mild ascending aorta dilatation (HCC)   3. Primary hypertension   4. BMI 39.0-39.9,adult    PLAN:    In order of problems listed above:  Shortness of breath, etiology combination of obesity, possible OSA or deconditioning.  Echo with EF 55 to 60%, impaired relaxation.  Compliance with CPAP mask, increased activity advised. Mild ascending aorta dilatation, 40 mm on echo 9/24.  Monitor serially with echoes. Hypertension, BP elevated today, usually controlled.  Continue valsartan 320 mg daily, Coreg 6.25 mg twice daily. Obesity, low-calorie diet, increase activity advised.  Follow-up in 6 months.      Medication Adjustments/Labs and Tests Ordered: Current medicines are reviewed at length with the patient today.  Concerns regarding medicines are outlined above.  Orders Placed This Encounter  Procedures   EKG 12-Lead   No orders of the defined types were placed in this encounter.   Patient Instructions   Medication  Instructions:  No changes at this time.   *If you need a refill on your cardiac medications before your next appointment, please call your pharmacy*   Lab Work: None  If you have labs (blood work) drawn today and your tests are completely normal, you will receive your results only by: MyChart Message (if you have MyChart) OR A paper copy in the mail If you have any lab test that is abnormal or we need to change your treatment, we will call you to review the results.   Testing/Procedures: None   Follow-Up: At [facility], you and your health needs are our priority.  As part of our continuing mission to provide you with exceptional heart care, we have created designated Provider Care Teams.  These Care Teams include your primary Cardiologist (physician) and Advanced Practice Providers (APPs -  Physician Assistants and Nurse Practitioners) who all work together to provide you with the care you need, when you need it.   Your next appointment:   6 month(s)  Provider:   Debbe Odea, MD      Signed, Debbe Odea, MD  05/06/2023 12:09 PM    [facility]

## 2023-05-06 NOTE — Patient Instructions (Signed)
Medication Instructions:  No changes at this time.   *If you need a refill on your cardiac medications before your next appointment, please call your pharmacy*   Lab Work: None  If you have labs (blood work) drawn today and your tests are completely normal, you will receive your results only by: [place] (if you have MyChart) OR A paper copy in the mail If you have any lab test that is abnormal or we need to change your treatment, we will call you to review the results.   Testing/Procedures: None   Follow-Up: At [facility], you and your health needs are our priority.  As part of our continuing mission to provide you with exceptional heart care, we have created designated Provider Care Teams.  These Care Teams include your primary Cardiologist (physician) and Advanced Practice Providers (APPs -  Physician Assistants and Nurse Practitioners) who all work together to provide you with the care you need, when you need it.   Your next appointment:   6 month(s)  Provider:   Kate Sable, MD

## 2023-05-08 ENCOUNTER — Ambulatory Visit: Payer: Medicare PPO | Admitting: Urology

## 2023-05-08 ENCOUNTER — Other Ambulatory Visit: Payer: Self-pay | Admitting: Urology

## 2023-05-08 ENCOUNTER — Ambulatory Visit: Payer: Medicare PPO | Admitting: Cardiology

## 2023-05-08 VITALS — BP 149/74 | HR 80 | Ht 68.0 in | Wt 260.1 lb

## 2023-05-08 DIAGNOSIS — C61 Malignant neoplasm of prostate: Secondary | ICD-10-CM

## 2023-05-08 DIAGNOSIS — N99111 Postprocedural bulbous urethral stricture: Secondary | ICD-10-CM

## 2023-05-08 DIAGNOSIS — R310 Gross hematuria: Secondary | ICD-10-CM

## 2023-05-08 LAB — MICROSCOPIC EXAMINATION: RBC, Urine: >30 /HPF — AB (ref 0–2)

## 2023-05-08 LAB — URINALYSIS, COMPLETE
Bilirubin, UA: NEGATIVE
Glucose, UA: NEGATIVE
Leukocytes,UA: NEGATIVE
Nitrite, UA: NEGATIVE
Specific Gravity, UA: >1.03 — ABNORMAL HIGH (ref 1.005–1.030)
Urobilinogen, Ur: 1 mg/dL (ref 0.2–1.0)
pH, UA: 5 (ref 5.0–7.5)

## 2023-05-08 MED ORDER — CEPHALEXIN 500 MG PO CAPS: 500.0000 mg | ORAL_CAPSULE | Freq: Three times a day (TID) | ORAL | 9 refills | Status: DC

## 2023-05-08 NOTE — Progress Notes (Signed)
   05/08/23  CC:  Chief Complaint  Patient presents with   Cysto    HPI: 83 year old male with high risk prostate cancer and gross hematuria who presents today for further evaluation of the latter.  He underwent CT urogram which I personally reviewed today.  No GU pathology is appreciated on this study, awaiting final radiologic interpretation.  He reports that he believes for a few days a little stop and then will start bleeding again.    Blood pressure (!) 149/74, pulse 80, height 5\' 8"  (1.727 m), weight 260 lb 2 oz (118 kg). NED. A&Ox3.   No respiratory distress   Abd soft, NT, ND Normal phallus with bilateral descended testicles  Cystoscopy Procedure Note  Patient identification was confirmed, informed consent was obtained, and patient was prepped using Betadine solution.  Lidocaine jelly was administered per urethral meatus.     Pre-Procedure: - Inspection reveals a normal caliber ureteral meatus.  Procedure: The flexible cystoscope was introduced to the level of the bulbar urethra where stricture was identified.  There is approximately 5 [place] in diameter but appeared to be relatively soft.  As such, under direct visualization, I advanced a sensor wire through this area.  I then railroaded the scope over the wire to build into the prostatic fossa which was slightly irregular, right-sided greater than left with dystrophic calcifications on the right and hypervascularity with bleeding noted with manipulation.  Upon entering the bladder, the bladder itself was slightly cloudy but otherwise unremarkable.  There was no tumors masses or lesions.  The bladder neck was relatively open on retroflexion.  Trigone was normal.  Finally, I removed the scope leaving the wire behind.  I then placed a 16 [place] council tip catheter over the wire a sterile as possible and inflated the balloon with return of clear yellow urine.  This was relatively tight within the bulbar  urethra.  Post-Procedure: - Patient tolerated the procedure well  Assessment/ Plan:  1. Gross hematuria (Primary) Likely secondary to dystrophic prostate calcifications  Awaiting final read on CT urogram, will call if there is any additional pathology noted - Urinalysis, Complete  2. Prostate cancer (HCC) Scheduled for PSA next month and follow-up with me in a year  3. Postprocedural bulbous urethral stricture Dilated today over wire and scope and catheter placed  Will place him on Keflex for the next 3 days secondary to this manipulation today is prophylaxis and have him return to the office on Friday for catheter removal.    Vanna Scotland, MD

## 2023-05-09 ENCOUNTER — Ambulatory Visit: Payer: Medicare PPO | Admitting: Adult Health

## 2023-05-10 ENCOUNTER — Encounter: Payer: Self-pay | Admitting: Physician Assistant

## 2023-05-10 ENCOUNTER — Ambulatory Visit: Admitting: Physician Assistant

## 2023-05-10 VITALS — BP 129/74 | HR 79 | Ht 68.0 in | Wt 256.2 lb

## 2023-05-10 DIAGNOSIS — N99111 Postprocedural bulbous urethral stricture: Secondary | ICD-10-CM

## 2023-05-10 NOTE — Progress Notes (Signed)
 Catheter Removal  Patient is present today for a catheter removal.  9ml of water was drained from the balloon. A 16FR foley cath was removed from the bladder, no complications were noted. Patient tolerated well.  Performed by: Benay Pike CMA  Follow up/ Additional notes: No follow-ups on file.

## 2023-05-15 ENCOUNTER — Encounter: Payer: Self-pay | Admitting: Adult Health

## 2023-05-15 ENCOUNTER — Ambulatory Visit: Admitting: Adult Health

## 2023-05-15 VITALS — BP 132/62 | HR 82 | Temp 98.0°F | Ht 68.0 in | Wt 263.2 lb

## 2023-05-15 DIAGNOSIS — G4733 Obstructive sleep apnea (adult) (pediatric): Secondary | ICD-10-CM

## 2023-05-15 NOTE — Assessment & Plan Note (Signed)
 Improved control on CPAP.  Patient has perceived benefit.  Will adjust CPAP pressure to see if we can decrease the number of events.  Also discussed an alternative mask which may decrease the number of mask leaks and help with comfort May like the DreamWear nasal mask  Plan  Patient Instructions  Continue on CPAP At bedtime, wear all night long for at least 6hr or more  Change CPAP pressure to 8 to 16 cmH2O.  Download in 1 month  May like the Dream wear nasal mask.  Work on healthy weight loss Do not drive if sleepy Follow up in 6 months and As needed

## 2023-05-15 NOTE — Progress Notes (Signed)
 @Patient  ID: [name], male    DOB: [birth year], 83 y.o.   MRN: [id number]  Chief Complaint  Patient presents with   Follow-up    Referring provider: [name], NP  HPI: 83 year old male seen for sleep consult September 2024 for loud snoring and daytime sleepiness found to have severe obstructive sleep apnea Medical history significant for prostate cancer  TEST/EVENTS :  HST 12/07/22 AHI 40.2, SpO2 low 74%.   05/15/2023 Follow up: OSA  Patient presents for 87-month follow-up.  Patient was seen in September for sleep consult.  He was set up for home sleep study that showed severe sleep apnea.  He was set up on CPAP last visit.  Patient says he is doing better on CPAP.  He is starting to tolerate it a little bit better.  His snoring has completely resolved.  He is currently using nasal pillows recently switched to a small nasal pillow which seems to be somewhat more comfortable.  He says it does leak and he has to reposition it several times throughout the night which is a little frustrating.  He also has to get up frequently to go to the bathroom for nocturia.  Says his sleep is somewhat fragmented.  But overall does feel that it has made some difference and he is benefiting from CPAP with decreased daytime sleepiness.  CPAP download shows 100% compliance.  Daily average usage at 4.5 hours.  Patient is on auto CPAP 5 to 15 cm H2O.  AHI 8.5/hour with daily average pressure at 13.7 cm H2O.  Current DME is [facility]   Allergies  Allergen Reactions   Penicillins Rash    Did it involve swelling of the face/tongue/throat, SOB, or low BP? No Did it involve sudden or severe rash/hives, skin peeling, or any reaction on the inside of your mouth or nose? No Did you need to seek medical attention at a hospital or doctor's office? No When did it last happen?      50 Years If all above answers are "NO", may proceed with cephalosporin use.     Immunization History  Administered Date(s)  Administered   Fluad Quad(high Dose 65+) 11/26/2018, 01/09/2019, 11/23/2020, 11/20/2021   Fluad Trivalent(High Dose 65+) 11/27/2022   Influenza Whole 01/03/2006, 12/24/2011   Influenza, High Dose Seasonal PF 10/20/2016, 11/03/2017, 12/03/2017, 11/26/2019   Influenza,inj,Quad PF,6+ Mos 12/15/2012, 10/28/2014   Influenza-Unspecified 12/25/2013, 11/08/2015, 10/20/2016   Moderna Covid-19 Fall Seasonal Vaccine 53yrs & older 12/07/2022   PFIZER(Purple Top)SARS-COV-2 Vaccination 03/26/2019, 04/16/2019, 12/07/2019, 07/01/2020   Pneumococcal Conjugate-13 06/30/2013   Pneumococcal Polysaccharide-23 06/21/2006   Respiratory Syncytial Virus Vaccine,Recomb Aduvanted(Arexvy) 11/29/2021   Td 08/19/2002   Tdap 08/13/2014, 01/27/2023   Zoster Recombinant(Shingrix) 10/20/2018, 01/09/2019   Zoster, Live 12/28/2009    Past Medical History:  Diagnosis Date   Allergy    Anxiety    Arthritis    Cataract    beginning stage bilateral   Diverticulosis    Elevated PSA    Esophageal stricture    Esophagitis    GERD (gastroesophageal reflux disease)    Gout    History of kidney stones    Hyperlipidemia    Hypertension    Inguinal hernia    Internal hemorrhoids    Pancreatitis    Prostate cancer (HCC)    Tubular adenoma of colon     Tobacco History: Social History   Tobacco Use  Smoking Status Never   Passive exposure: Never  Smokeless Tobacco Never   Counseling  given: Not Answered   Outpatient Medications Prior to Visit  Medication Sig Dispense Refill   acetaminophen (TYLENOL) 500 MG tablet Take 500 mg by mouth every 6 (six) hours as needed.     aspirin 81 MG tablet Take 81 mg by mouth daily.     Bioflavonoid Products (PERIDIN-C PO) Take by mouth.     carvedilol (COREG) 6.25 MG tablet TAKE 1 TABLET BY MOUTH 2 TIMES DAILY WITH A MEAL. 180 tablet 1   fluticasone (FLONASE) 50 MCG/ACT nasal spray Place into both nostrils daily.     MITIGARE 0.6 MG CAPS TAKE 0.6 MG BY MOUTH DAILY AS NEEDED  (GOUT FLARE). 90 capsule 1   Multiple Vitamins-Minerals (CENTRUM SILVER 50+MEN PO) Take by mouth.     pantoprazole (PROTONIX) 40 MG tablet Take 1 tablet (40 mg total) by mouth 2 (two) times daily. 180 tablet 3   polyethylene glycol (MIRALAX / GLYCOLAX) 17 g packet Take 17 g by mouth daily.     tamsulosin (FLOMAX) 0.4 MG CAPS capsule Take 1 capsule (0.4 mg total) by mouth daily. 90 capsule 3   valsartan (DIOVAN) 320 MG tablet TAKE 1 TABLET BY MOUTH EVERY DAY 90 tablet 1   No facility-administered medications prior to visit.     Review of Systems:   Constitutional:   No  weight loss, night sweats,  Fevers, chills, +fatigue, or  lassitude.  HEENT:   No headaches,  Difficulty swallowing,  Tooth/dental problems, or  Sore throat,                No sneezing, itching, ear ache, nasal congestion, post nasal drip,   CV:  No chest pain,  Orthopnea, PND, swelling in lower extremities, anasarca, dizziness, palpitations, syncope.   GI  No heartburn, indigestion, abdominal pain, nausea, vomiting, diarrhea, change in bowel habits, loss of appetite, bloody stools.   Resp: No shortness of breath with exertion or at rest.  No excess mucus, no productive cough,  No non-productive cough,  No coughing up of blood.  No change in color of mucus.  No wheezing.  No chest wall deformity  Skin: no rash or lesions.  GU:  no hematuria   MS:  No joint pain or swelling.  No decreased range of motion.  No back pain.    Physical Exam  BP 132/62 (BP Location: Left Arm, Patient Position: Sitting, Cuff Size: Normal)   Pulse 82   Temp 98 F (36.7 C) (Oral)   Ht 5\' 8"  (1.727 m)   Wt 263 lb 3.2 oz (119.4 kg)   SpO2 95%   BMI 40.02 kg/m   GEN: A/Ox3; pleasant , NAD, well nourished    HEENT:  [place]/AT,  NOSE-clear, THROAT-clear, no lesions, no postnasal drip or exudate noted.   NECK:  Supple w/ fair ROM; no JVD; normal carotid impulses w/o bruits; no thyromegaly or nodules palpated; no lymphadenopathy.    RESP   Clear  P & A; w/o, wheezes/ rales/ or rhonchi. no accessory muscle use, no dullness to percussion  CARD:  RRR, no m/r/g, no peripheral edema, pulses intact, no cyanosis or clubbing.  GI:   Soft & nt; nml bowel sounds; no organomegaly or masses detected.   Musco: Warm bil, no deformities or joint swelling noted.   Neuro: alert, no focal deficits noted.    Skin: Warm, no lesions or rashes    Lab Results:      BNP No results found for: "BNP"   Imaging:   Administration  History     None           No data to display          No results found for: "NITRICOXIDE"      Assessment & Plan:   OSA (obstructive sleep apnea) Improved control on CPAP.  Patient has perceived benefit.  Will adjust CPAP pressure to see if we can decrease the number of events.  Also discussed an alternative mask which may decrease the number of mask leaks and help with comfort May like the DreamWear nasal mask  Plan  Patient Instructions  Continue on CPAP At bedtime, wear all night long for at least 6hr or more  Change CPAP pressure to 8 to 16 cmH2O.  Download in 1 month  May like the Dream wear nasal mask.  Work on healthy weight loss Do not drive if sleepy Follow up in 6 months and As needed      Marathon Oil, NP 05/15/2023

## 2023-05-15 NOTE — Patient Instructions (Addendum)
 Continue on CPAP At bedtime, wear all night long for at least 6hr or more  Change CPAP pressure to 8 to 16 cmH2O.  Download in 1 month  May like the Dream wear nasal mask.  Work on healthy weight loss Do not drive if sleepy Follow up in 6 months and As needed

## 2023-06-05 ENCOUNTER — Encounter: Payer: Self-pay | Admitting: Nurse Practitioner

## 2023-06-17 ENCOUNTER — Encounter: Payer: Self-pay | Admitting: Nurse Practitioner

## 2023-06-17 ENCOUNTER — Encounter: Admitting: Nurse Practitioner

## 2023-06-17 ENCOUNTER — Telehealth: Payer: Self-pay

## 2023-06-17 ENCOUNTER — Telehealth: Payer: Self-pay | Admitting: Nurse Practitioner

## 2023-06-17 NOTE — Telephone Encounter (Signed)
 I also sent letter to patient via MyChart.

## 2023-06-17 NOTE — Telephone Encounter (Signed)
 I left a voicemail for patient on his home and mobile phones letting him know that his appointment with Bluford Burkitt, NP, on 06/18/2023 has been changed from 11am to 1:40pm.  I asked patient to please let us  know if this time does not work for him.

## 2023-06-17 NOTE — Telephone Encounter (Signed)
 Your provider will not be in the office today. Please call the office to reschedule.

## 2023-06-18 ENCOUNTER — Encounter: Admitting: Nurse Practitioner

## 2023-06-18 ENCOUNTER — Ambulatory Visit: Admitting: Nurse Practitioner

## 2023-06-18 ENCOUNTER — Encounter: Payer: Self-pay | Admitting: Nurse Practitioner

## 2023-06-18 VITALS — BP 122/72 | HR 56 | Temp 98.0°F | Ht 68.0 in | Wt 258.6 lb

## 2023-06-18 DIAGNOSIS — K7460 Unspecified cirrhosis of liver: Secondary | ICD-10-CM | POA: Diagnosis not present

## 2023-06-18 DIAGNOSIS — Z8546 Personal history of malignant neoplasm of prostate: Secondary | ICD-10-CM | POA: Diagnosis not present

## 2023-06-18 DIAGNOSIS — G4733 Obstructive sleep apnea (adult) (pediatric): Secondary | ICD-10-CM | POA: Diagnosis not present

## 2023-06-18 DIAGNOSIS — I10 Essential (primary) hypertension: Secondary | ICD-10-CM

## 2023-06-18 NOTE — Assessment & Plan Note (Signed)
 His CPAP use is below the recommended duration, potentially causing poor rest. Mask adjustment may improve compliance. Encourage CPAP use for at least 6 hours per night and consider alternative CPAP mask options. Follow up with Pulmonology as scheduled.

## 2023-06-18 NOTE — Assessment & Plan Note (Signed)
 CT scan in February showed "hepatic steatosis with hepatic morphologic changes compatible with cirrhosis." Fatigue and weakness may result from liver dysfunction. He has no history of alcohol use or prior elevated liver function tests. Order a liver function panel today and schedule follow up appointment with GI.

## 2023-06-18 NOTE — Assessment & Plan Note (Signed)
 Blood pressure readings are higher at home than in-office, though he reports no symptoms. BP at goal today. The current carvedilol regimen is effective, 6.25 mg twice daily and valsartan 320 mg daily. Maintain the current regimen and continue to monitor blood pressure at home.

## 2023-06-18 NOTE — Assessment & Plan Note (Signed)
 He completed treatment a year ago with no signs of recurrence, though he experiences increased fatigue post-treatment. Continued monitoring is necessary. Continue routine follow-up with urology.
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
## 2023-06-19 ENCOUNTER — Encounter: Payer: Self-pay | Admitting: Nurse Practitioner

## 2023-06-19 LAB — HEPATIC FUNCTION PANEL
ALT: 22 U/L (ref 0–53)
AST: 22 U/L (ref 0–37)
Albumin: 4.2 g/dL (ref 3.5–5.2)
Alkaline Phosphatase: 79 U/L (ref 39–117)
Bilirubin, Direct: 0.1 mg/dL (ref 0.0–0.3)
Total Bilirubin: 0.6 mg/dL (ref 0.2–1.2)
Total Protein: 7 g/dL (ref 6.0–8.3)

## 2023-06-19 LAB — BASIC METABOLIC PANEL WITH GFR
BUN: 16 mg/dL (ref 6–23)
CO2: 30 meq/L (ref 19–32)
Calcium: 9.2 mg/dL (ref 8.4–10.5)
Chloride: 102 meq/L (ref 96–112)
Creatinine, Ser: 1.38 mg/dL (ref 0.40–1.50)
GFR: 47.22 mL/min — ABNORMAL LOW (ref >60.00)
Glucose, Bld: 94 mg/dL (ref 70–99)
Potassium: 4.5 meq/L (ref 3.5–5.1)
Sodium: 140 meq/L (ref 135–145)

## 2023-06-20 ENCOUNTER — Other Ambulatory Visit: Payer: Self-pay

## 2023-06-20 DIAGNOSIS — C61 Malignant neoplasm of prostate: Secondary | ICD-10-CM

## 2023-06-21 ENCOUNTER — Other Ambulatory Visit: Payer: Medicare PPO

## 2023-06-21 LAB — PSA: Prostate Specific Ag, Serum: <0.1 ng/mL (ref 0.0–4.0)

## 2023-06-25 ENCOUNTER — Ambulatory Visit: Payer: Self-pay | Admitting: Urology

## 2023-06-25 VITALS — BP 169/71 | HR 72

## 2023-06-25 DIAGNOSIS — C61 Malignant neoplasm of prostate: Secondary | ICD-10-CM

## 2023-06-25 DIAGNOSIS — N138 Other obstructive and reflux uropathy: Secondary | ICD-10-CM

## 2023-06-25 DIAGNOSIS — R5383 Other fatigue: Secondary | ICD-10-CM | POA: Diagnosis not present

## 2023-06-25 DIAGNOSIS — N401 Enlarged prostate with lower urinary tract symptoms: Secondary | ICD-10-CM | POA: Diagnosis not present

## 2023-06-25 DIAGNOSIS — R310 Gross hematuria: Secondary | ICD-10-CM

## 2023-06-25 NOTE — Progress Notes (Signed)
 @ENCDATE @ 2:44 PM   [name] 1940-04-03 [id number]  Referring provider: Kent Pear, MD No address on file  Chief Complaint  Patient presents with   Follow-up    HPI:  83 year old male with a personal history of high-risk prostate cancer and more recently,intermittent gross hematuria. He was found to have a relatively non-dense bulbar urethral stricture which was able to be dilated at the time of cystoscopy in March. No tumors were identified. A Foley catheter was placed for 48 hours. The hematuria is felt likely to be secondary to dystrophic calcifications. The Ct urogram was pending at the time.   He returns today for reassessment.   In terms of prostate cancer, his PSA remains remains undetectable as of 06/20/2023. He has completed two years of Eligard  therapy, with the last injection given at the previous visit.   He continues to experience occasional hot flashes, though they are not frequent.   He reports a recent CT scan, which revealed fatty liver and possible cirrhotic changes; he is scheduled to follow up with gastroenterology in July. He expresses concern about the possibility of cirrhosis but is reassured that this is likely a chronic, compensated process.   He also reports a history of bilateral hernias.   Regarding urinary symptoms, he notes that he prefers to sit to urinate due to a weak and unsteady stream, and describes a recent episode of urinary retention due to a clot, which resolved after passage of the clot.   He denies current hematuria for the past week.   He reports new onset headaches, which are infrequent and not previously experienced, but denies any clear association with his prostate cancer history.   He describes ongoing generalized weakness but is optimistic about improvement as hormone therapy is discontinued.   Family history is notable for his father being in poor health.   He is currently under the care of a new primary care  provider at the [place], who recently ordered laboratory tests that were reportedly unremarkable.  PMH: Past Medical History:  Diagnosis Date   Allergy    Anxiety    Arthritis    Cataract    beginning stage bilateral   Diverticulosis    Elevated PSA    Esophageal stricture    Esophagitis    GERD (gastroesophageal reflux disease)    Gout    History of kidney stones    Hyperlipidemia    Hypertension    Inguinal hernia    Internal hemorrhoids    Pancreatitis    Prostate cancer (HCC)    Tubular adenoma of colon     Surgical History: Past Surgical History:  Procedure Laterality Date   BACK SURGERY  1980's   L4/5   BALLOON DILATION N/A 11/17/2018   Procedure: URETHRAL BALLOON DILATION;  Surgeon: Dustin Gimenez, MD;  Location: [facility];  Service: Urology;  Laterality: N/A;   CARPAL TUNNEL RELEASE Right 08/15/2016   Procedure: CARPAL TUNNEL RELEASE ENDOSCOPIC;  Surgeon: Elner Hahn, MD;  Location: [facility];  Service: Orthopedics;  Laterality: Right;   CATARACT EXTRACTION W/PHACO Right 03/14/2021   Procedure: CATARACT EXTRACTION PHACO AND INTRAOCULAR LENS PLACEMENT (IOC) RIGHT 6.84 00:52.3;  Surgeon: Clair Crews, MD;  Location: [facility];  Service: Ophthalmology;  Laterality: Right;   CATARACT EXTRACTION W/PHACO Left 03/28/2021   Procedure: CATARACT EXTRACTION PHACO AND INTRAOCULAR LENS PLACEMENT (IOC) LEFT;  Surgeon: Clair Crews, MD;  Location: [facility];  Service: Ophthalmology;  Laterality: Left;  COLONOSCOPY     CYSTOSCOPY N/A 11/17/2018   Procedure: CYSTOSCOPY;  Surgeon: Dustin Gimenez, MD;  Location: [facility];  Service: Urology;  Laterality: N/A;   CYSTOSCOPY WITH LITHOLAPAXY N/A 10/09/2017   Procedure: CYSTOSCOPY WITH LITHOLAPAXY;  Surgeon: Dustin Gimenez, MD;  Location: [facility];  Service: Urology;  Laterality: N/A;   ESOPHAGOGASTRODUODENOSCOPY  11-21-06   Esophagitis, Stricture/dilated    HOLEP-LASER ENUCLEATION OF THE PROSTATE WITH  MORCELLATION N/A 10/09/2017   Procedure: HOLEP-LASER ENUCLEATION OF THE PROSTATE WITH MORCELLATION;  Surgeon: Dustin Gimenez, MD;  Location: [facility];  Service: Urology;  Laterality: N/A;   LYMPH NODE BIOPSY  1957   benign  lymph node removed   Right arm fracture     TONSILLECTOMY     TOTAL KNEE ARTHROPLASTY  09-02-02   Bilateral   UPPER GASTROINTESTINAL ENDOSCOPY      Home Medications:  Allergies as of 06/25/2023       Reactions   Penicillins Rash   Did it involve swelling of the face/tongue/throat, SOB, or low BP? No Did it involve sudden or severe rash/hives, skin peeling, or any reaction on the inside of your mouth or nose? No Did you need to seek medical attention at a hospital or doctor's office? No When did it last happen?      50 Years If all above answers are "NO", may proceed with cephalosporin use.        Medication List        Accurate as of June 25, 2023  2:44 PM. If you have any questions, ask your nurse or doctor.          acetaminophen 500 MG tablet Commonly known as: TYLENOL Take 500 mg by mouth every 6 (six) hours as needed.   aspirin 81 MG tablet Take 81 mg by mouth daily.   carvedilol  6.25 MG tablet Commonly known as: COREG  TAKE 1 TABLET BY MOUTH 2 TIMES DAILY WITH A MEAL.   CENTRUM SILVER 50+MEN PO Take by mouth.   fluticasone 50 MCG/ACT nasal spray Commonly known as: FLONASE Place into both nostrils daily.   Mitigare  0.6 MG Caps Generic drug: Colchicine  TAKE 0.6 MG BY MOUTH DAILY AS NEEDED (GOUT FLARE).   pantoprazole  40 MG tablet Commonly known as: PROTONIX  Take 1 tablet (40 mg total) by mouth 2 (two) times daily.   PERIDIN-C PO Take by mouth.   polyethylene glycol 17 g packet Commonly known as: MIRALAX / GLYCOLAX Take 17 g by mouth daily.   tamsulosin  0.4 MG Caps capsule Commonly known as: FLOMAX  Take 1 capsule (0.4 mg total) by mouth daily.   valsartan  320 MG tablet Commonly known as: DIOVAN  TAKE 1 TABLET BY MOUTH EVERY  DAY        Allergies:  Allergies  Allergen Reactions   Penicillins Rash    Did it involve swelling of the face/tongue/throat, SOB, or low BP? No Did it involve sudden or severe rash/hives, skin peeling, or any reaction on the inside of your mouth or nose? No Did you need to seek medical attention at a hospital or doctor's office? No When did it last happen?      50 Years If all above answers are "NO", may proceed with cephalosporin use.     Family History: Family History  Problem Relation Age of Onset   Ulcers Father    Diverticulitis Father    Arthritis Father    Hypertension Brother    Obesity Brother    Cancer Brother  throat   Arthritis Mother    Depression Mother        Anxiety   Colon cancer Neg Hx    Colon polyps Neg Hx    Esophageal cancer Neg Hx    Rectal cancer Neg Hx    Stomach cancer Neg Hx    Kidney disease Neg Hx    Kidney cancer Neg Hx    Prostate cancer Neg Hx     Social History:  reports that he has never smoked. He has never been exposed to tobacco smoke. He has never used smokeless tobacco. He reports that he does not drink alcohol and does not use drugs.   Physical Exam: BP (!) 169/71   Pulse 72   Constitutional:  Alert and oriented, No acute distress. HEENT: [place] AT, moist mucus membranes.  Trachea midline, no masses. Neurologic: Grossly intact, no focal deficits, moving all 4 extremities. Psychiatric: Normal mood and affect.  Pertinent Imaging: EXAM: CT ABDOMEN AND PELVIS WITHOUT AND WITH CONTRAST   TECHNIQUE: Multidetector CT imaging of the abdomen and pelvis was performed following the standard protocol before and following the bolus administration of intravenous contrast.   RADIATION DOSE REDUCTION: This exam was performed according to the departmental dose-optimization program which includes automated exposure control, adjustment of the mA and/or kV according to patient size and/or use of iterative reconstruction  technique.   CONTRAST:  OMNIPAQUE  IOHEXOL  300 MG/ML  SOLN   COMPARISON:  CT June 05, 2021   FINDINGS: Lower chest: No acute abnormality.   Hepatobiliary: Hepatic steatosis. Hepatic morphologic changes compatible with cirrhosis. Stable nonenhancing hypodense 8 mm lesion in the central right lobe of the liver on image 18/8 favored to reflect a cyst. No solid enhancing hepatic lesion. Gallbladder is unremarkable. No biliary ductal dilation.   Pancreas: No pancreatic ductal dilation or evidence of acute inflammation.   Spleen: No splenomegaly. Stable hypodense 12 mm splenic lesion on image 27/8 possibly reflecting a lymphangioma.   Adrenals/Urinary Tract: Bilateral adrenal glands appear normal.   No hydronephrosis. Punctate nonobstructive bilateral renal stones measure up to 2 mm. No obstructive ureteral or bladder calculi.   No solid enhancing renal mass. Bilateral hypodense subcentimeter renal lesions are technically too small to accurately characterize but statistically likely to reflect cysts.   Kidneys demonstrate symmetric enhancement and excretion of contrast material. No collecting system duplication. No suspicious filling defect identified within the opacified portions of the collecting systems or ureters on delayed imaging.   Urinary bladder is minimally distended limiting evaluation.   Stomach/Bowel: Stomach is unremarkable for degree of distension. No pathologic dilation of small or large bowel. Colonic diverticulosis with new mild fat stranding in the sigmoid mesentery.   Vascular/Lymphatic: Aortic atherosclerosis. Normal caliber abdominal aorta. Smooth IVC contours. The portal, splenic and superior mesenteric veins are patent. No pathologically enlarged abdominal or pelvic lymph nodes.   Reproductive: Similar heterogeneous enlargement of the prostate gland.   Other: Fat containing bilateral inguinal hernias.   Musculoskeletal: Multilevel degenerative  change of the spine. Severe degenerative change of the left hip and moderate degenerative change of the right hip.   IMPRESSION: 1. Punctate nonobstructive bilateral renal stones measure up to 2 mm. No obstructive ureteral or bladder calculi. 2. No solid enhancing renal mass. 3. Similar heterogeneous enlargement of the prostate gland. 4. Colonic diverticulosis with new mild fat stranding in the sigmoid mesentery, which may reflect early/mild acute uncomplicated diverticulitis. 5. Hepatic steatosis with hepatic morphologic changes compatible with cirrhosis. 6.  Aortic  Atherosclerosis (ICD10-I70.0).     Electronically Signed   By: Tama Fails M.D.   On: 05/11/2023 09:13  This was personally reviewed and I agree with the radiologic interpretation.   Assessment & Plan:    1. Prostate cancer, high-risk,  - Status post IMRT and HoLEP in 2019, completed 2 years of Eligard , PSA undetectable - PSA remains undetectable; He has completed 2 years of Eligard , which is now discontinued. - Monitor PSA every 6 months as testosterone recovers; expect PSA to rise slightly and plateau at a new nadir (<1 for most, but individualized). Biochemical recurrence defined as PSA >2 above new nadir. - Next PSA in 6 months, then annual follow-up with repeat PSA. - He educated on expected PSA kinetics and symptoms to monitor. - No further ADT planned at this time.  2. Gross hematuria  - Likely secondary to dystrophic prostatic calcifications post-radiation and HoLEP - Recent episode of gross hematuria with passage of clots; currently resolved. - He educated on signs of significant bleeding and when to seek care. - If recurrent hematuria or persistent LUTS after testosterone recovery, consider starting Finasteride to reduce prostatic vascularity and bleeding risk. - Continue to monitor for recurrence; no intervention at this time.  3. Lower urinary tract symptoms and history of urethral stricture  (dilated) - Mild symptoms: weak stream, prefers sitting to void, occasional passage of clots, no current urinary retention. - He educated on symptoms of stricture recurrence (progressive weak stream, urinary retention). - If significant worsening, consider repeat cystoscopy and possible dilation. - Monitor with IPSS, PVR, and UA at next follow-up.  4. Fatty liver with possible cirrhotic changes on recent CT - CT shows fatty liver and nodular contour suggestive of early cirrhosis. - He has upcoming gastroenterology appointment in July; not urgent as likely longstanding. - Will check recent labs (LFTs, bilirubin) from primary care; if not done, recommend baseline hepatic panel. - Counsel on lifestyle modification (diet, exercise, avoid hepatotoxins such as Tylenol/alcohol). - Will defer to GI for further management and recommendations.  5. New-onset headaches - New, infrequent headaches; no prior history. - No clear etiology identified; not felt related to prostate cancer or current therapies. - Monitor for concerning features (sudden onset, severe, associated neurologic symptoms). - If persistent or worsening, consider further evaluation.  6. Weakness, ongoing but improving - Likely multifactorial: recent ADT, age, deconditioning. - Expect improvement as ADT effects wane and testosterone recovers. - Encourage activity as tolerated.  Return in about 6 months (around 12/25/2023) for repeat PSA. Return in 1 year for PA visit with PSA, IPSS, PVR, UA.   [facility] [address] [place], [place] [address] [phone]

## 2023-08-17 ENCOUNTER — Encounter: Payer: Self-pay | Admitting: Nurse Practitioner

## 2023-08-17 DIAGNOSIS — I10 Essential (primary) hypertension: Secondary | ICD-10-CM

## 2023-08-19 ENCOUNTER — Other Ambulatory Visit: Payer: Self-pay

## 2023-08-19 DIAGNOSIS — I10 Essential (primary) hypertension: Secondary | ICD-10-CM

## 2023-08-19 DIAGNOSIS — K219 Gastro-esophageal reflux disease without esophagitis: Secondary | ICD-10-CM

## 2023-08-19 MED ORDER — VALSARTAN 320 MG PO TABS: 320.0000 mg | ORAL_TABLET | Freq: Every day | ORAL | 3 refills | Status: AC

## 2023-08-19 MED ORDER — PANTOPRAZOLE SODIUM 40 MG PO TBEC: 40.0000 mg | DELAYED_RELEASE_TABLET | Freq: Two times a day (BID) | ORAL | 3 refills | Status: AC

## 2023-08-19 MED ORDER — CARVEDILOL 6.25 MG PO TABS: 6.2500 mg | ORAL_TABLET | Freq: Two times a day (BID) | ORAL | 3 refills | Status: DC

## 2023-09-10 ENCOUNTER — Ambulatory Visit: Admitting: Internal Medicine

## 2023-09-10 ENCOUNTER — Encounter: Payer: Self-pay | Admitting: Internal Medicine

## 2023-09-10 ENCOUNTER — Other Ambulatory Visit (INDEPENDENT_AMBULATORY_CARE_PROVIDER_SITE_OTHER)

## 2023-09-10 VITALS — BP 138/78 | HR 66 | Ht 68.0 in | Wt 258.5 lb

## 2023-09-10 DIAGNOSIS — K219 Gastro-esophageal reflux disease without esophagitis: Secondary | ICD-10-CM

## 2023-09-10 DIAGNOSIS — K760 Fatty (change of) liver, not elsewhere classified: Secondary | ICD-10-CM

## 2023-09-10 DIAGNOSIS — R932 Abnormal findings on diagnostic imaging of liver and biliary tract: Secondary | ICD-10-CM | POA: Diagnosis not present

## 2023-09-10 DIAGNOSIS — K5909 Other constipation: Secondary | ICD-10-CM

## 2023-09-10 DIAGNOSIS — Z860100 Personal history of colon polyps, unspecified: Secondary | ICD-10-CM

## 2023-09-10 LAB — CBC WITH DIFFERENTIAL/PLATELET
Basophils Absolute: 0 K/uL (ref 0.0–0.1)
Basophils Relative: 0.4 % (ref 0.0–3.0)
Eosinophils Absolute: 0.5 K/uL (ref 0.0–0.7)
Eosinophils Relative: 8.5 % — ABNORMAL HIGH (ref 0.0–5.0)
HCT: 38.7 % — ABNORMAL LOW (ref 39.0–52.0)
Hemoglobin: 13 g/dL (ref 13.0–17.0)
Lymphocytes Relative: 21.5 % (ref 12.0–46.0)
Lymphs Abs: 1.3 K/uL (ref 0.7–4.0)
MCHC: 33.5 g/dL (ref 30.0–36.0)
MCV: 91.2 fl (ref 78.0–100.0)
Monocytes Absolute: 0.6 K/uL (ref 0.1–1.0)
Monocytes Relative: 9.4 % (ref 3.0–12.0)
Neutro Abs: 3.7 K/uL (ref 1.4–7.7)
Neutrophils Relative %: 60.2 % (ref 43.0–77.0)
Platelets: 199 K/uL (ref 150.0–400.0)
RBC: 4.25 Mil/uL (ref 4.22–5.81)
RDW: 14.2 % (ref 11.5–15.5)
WBC: 6.2 K/uL (ref 4.0–10.5)

## 2023-09-10 LAB — PROTIME-INR
INR: 1.2 ratio — ABNORMAL HIGH (ref 0.8–1.0)
Prothrombin Time: 12.8 s (ref 9.6–13.1)

## 2023-09-10 LAB — COMPREHENSIVE METABOLIC PANEL WITH GFR
ALT: 31 U/L (ref 0–53)
AST: 30 U/L (ref 0–37)
Albumin: 4.3 g/dL (ref 3.5–5.2)
Alkaline Phosphatase: 71 U/L (ref 39–117)
BUN: 17 mg/dL (ref 6–23)
CO2: 29 meq/L (ref 19–32)
Calcium: 9.1 mg/dL (ref 8.4–10.5)
Chloride: 104 meq/L (ref 96–112)
Creatinine, Ser: 1.47 mg/dL (ref 0.40–1.50)
GFR: 43.7 mL/min — ABNORMAL LOW (ref >60.00)
Glucose, Bld: 92 mg/dL (ref 70–99)
Potassium: 4.4 meq/L (ref 3.5–5.1)
Sodium: 138 meq/L (ref 135–145)
Total Bilirubin: 0.5 mg/dL (ref 0.2–1.2)
Total Protein: 7.6 g/dL (ref 6.0–8.3)

## 2023-09-10 NOTE — Progress Notes (Signed)
 Subjective:    Patient ID: [name], male    DOB: 11-27-1940, 83 y.o.   MRN: [id number]  HPI [name] is an 83 year old male with a history of constipation, diverticulosis, GERD, colonic polyps, prior pancreatitis, history of hepatic steatosis, prostate cancer, BPH and gout who presents for follow-up with concerns following a CT scan suggestive of cirrhosis.  He has no prior history of liver issues other than known fatty liver, and recent liver function tests, including bilirubin, AST, and ALT, were normal. He denies any alcohol consumption.  He experiences daily leg swelling, which he describes as 'a little bit of fluid' and considers it normal for him. No abdominal swelling is reported. He had episodes of dark urine with blood three out of four times about two months ago, which has since resolved, attributing it to kidney stones.  No jaundice.  No itching.  No abdominal pain.  No easy bruising or bleeding.  He has a history of chronic constipation, managed with daily Miralax, resulting in one to three soft bowel movements daily. He also takes Protonix  twice a day for reflux, described as 'stuff coming back' rather than heartburn.  He notes a significant decrease in strength, estimating a loss of 30-40% over the last two years, but his body weight has remained stable. He follows a low-salt diet to manage kidney stones.  There is no family history of liver trouble that he can recall.   Review of Systems As per HPI, otherwise negative  Current Medications, Allergies, Past Medical History, Past Surgical History, Family History and Social History were reviewed in [employer] record.    Objective:   Physical Exam BP 138/78   Pulse 66   Ht 5' 8 (1.727 m)   Wt 258 lb 8 oz (117.3 kg)   SpO2 97%   BMI 39.30 kg/m  Gen: awake, alert, NAD HEENT: anicteric  CV: RRR, no mrg Pulm: CTA b/l Abd: soft, obese, diastases recti, NT/ND, +BS throughout; no  hepatosplenomegaly Ext: no c/c, trace pretibial edema Skin: No spider angiomata seen on the chest nor palmar erythema Neuro: nonfocal     Latest Ref Rng & Units 09/10/2023   11:19 AM 10/15/2022    2:06 PM 09/05/2022    9:16 AM  CBC  WBC 4.0 - 10.5 K/uL 6.2  6.2  6.2   Hemoglobin 13.0 - 17.0 g/dL 86.9  87.3  87.7   Hematocrit 39.0 - 52.0 % 38.7  38.2  36.9   Platelets 150.0 - 400.0 K/uL 199.0  217.0  191.0    CMP     Component Value Date/Time   NA 138 09/10/2023 1119   NA 137 09/01/2013 0410   K 4.4 09/10/2023 1119   K 3.6 09/01/2013 0410   CL 104 09/10/2023 1119   CL 106 09/01/2013 0410   CO2 29 09/10/2023 1119   CO2 23 09/01/2013 0410   GLUCOSE 92 09/10/2023 1119   GLUCOSE 107 (H) 09/01/2013 0410   BUN 17 09/10/2023 1119   BUN 13 06/24/2017 1039   BUN 14 09/01/2013 0410   CREATININE 1.47 09/10/2023 1119   CREATININE 1.40 (H) 09/01/2013 0410   CALCIUM 9.1 09/10/2023 1119   CALCIUM 8.0 (L) 09/01/2013 0410   PROT 7.6 09/10/2023 1119   PROT 8.1 08/29/2013 2100   ALBUMIN 4.3 09/10/2023 1119   ALBUMIN 3.7 08/29/2013 2100   AST 30 09/10/2023 1119   AST 35 08/29/2013 2100   ALT 31 09/10/2023 1119  ALT 34 08/29/2013 2100   ALKPHOS 71 09/10/2023 1119   ALKPHOS 81 08/29/2013 2100   BILITOT 0.5 09/10/2023 1119   BILITOT 1.1 (H) 08/29/2013 2100   GFR 43.70 (L) 09/10/2023 1119   GFRNONAA 46 (L) 09/01/2021 1201   GFRNONAA 49 (L) 09/01/2013 0410   Lab Results  Component Value Date   INR 1.2 (H) 09/10/2023   INR 1.03 10/04/2017   CT ABDOMEN AND PELVIS WITHOUT AND WITH CONTRAST   TECHNIQUE: Multidetector CT imaging of the abdomen and pelvis was performed following the standard protocol before and following the bolus administration of intravenous contrast.   RADIATION DOSE REDUCTION: This exam was performed according to the departmental dose-optimization program which includes automated exposure control, adjustment of the mA and/or kV according to patient size and/or use  of iterative reconstruction technique.   CONTRAST:  OMNIPAQUE  IOHEXOL  300 MG/ML  SOLN   COMPARISON:  CT June 05, 2021   FINDINGS: Lower chest: No acute abnormality.   Hepatobiliary: Hepatic steatosis. Hepatic morphologic changes compatible with cirrhosis. Stable nonenhancing hypodense 8 mm lesion in the central right lobe of the liver on image 18/8 favored to reflect a cyst. No solid enhancing hepatic lesion. Gallbladder is unremarkable. No biliary ductal dilation.   Pancreas: No pancreatic ductal dilation or evidence of acute inflammation.   Spleen: No splenomegaly. Stable hypodense 12 mm splenic lesion on image 27/8 possibly reflecting a lymphangioma.   Adrenals/Urinary Tract: Bilateral adrenal glands appear normal.   No hydronephrosis. Punctate nonobstructive bilateral renal stones measure up to 2 mm. No obstructive ureteral or bladder calculi.   No solid enhancing renal mass. Bilateral hypodense subcentimeter renal lesions are technically too small to accurately characterize but statistically likely to reflect cysts.   Kidneys demonstrate symmetric enhancement and excretion of contrast material. No collecting system duplication. No suspicious filling defect identified within the opacified portions of the collecting systems or ureters on delayed imaging.   Urinary bladder is minimally distended limiting evaluation.   Stomach/Bowel: Stomach is unremarkable for degree of distension. No pathologic dilation of small or large bowel. Colonic diverticulosis with new mild fat stranding in the sigmoid mesentery.   Vascular/Lymphatic: Aortic atherosclerosis. Normal caliber abdominal aorta. Smooth IVC contours. The portal, splenic and superior mesenteric veins are patent. No pathologically enlarged abdominal or pelvic lymph nodes.   Reproductive: Similar heterogeneous enlargement of the prostate gland.   Other: Fat containing bilateral inguinal hernias.    Musculoskeletal: Multilevel degenerative change of the spine. Severe degenerative change of the left hip and moderate degenerative change of the right hip.   IMPRESSION: 1. Punctate nonobstructive bilateral renal stones measure up to 2 mm. No obstructive ureteral or bladder calculi. 2. No solid enhancing renal mass. 3. Similar heterogeneous enlargement of the prostate gland. 4. Colonic diverticulosis with new mild fat stranding in the sigmoid mesentery, which may reflect early/mild acute uncomplicated diverticulitis. 5. Hepatic steatosis with hepatic morphologic changes compatible with cirrhosis. 6.  Aortic Atherosclerosis (ICD10-I70.0).     Electronically Signed   By: Reyes Holder M.D.   On: 05/11/2023 09:13      Assessment & Plan:  83 year old male with a history of constipation, diverticulosis, GERD, colonic polyps, prior pancreatitis, history of hepatic steatosis, prostate cancer, BPH and gout who presents for follow-up with concerns following a CT scan suggestive of cirrhosis.  Hepatic steatosis/imaging suggestive of cirrhosis Incidental finding with normal liver function tests and no evidence of portal hypertension. Likely metabolic fatty liver due to absence of  alcohol use and normal enzyme levels. Liver biopsy unnecessary for now. Fibroscan planned for non-invasive assessment once this technology is available in [place].  If he does have cirrhosis it is extremely well compensated at present. - Order CBC, CMP, INR, and fib-4 score. - Check hepatitis serologies: hep C antibody, hep B surface antigen and antibody. - Order ANA and IgG tests. - Follow-up in September or October. - Await fibroscan availability. - We discussed if cirrhosis were confirmed that we would recommend routine HCC screening and variceal screening however he is already on carvedilol  which is protective in the setting of portal hypertension -Risk factor modification with primary care regarding blood  pressure, cholesterol weight and blood glucose; heart healthy Mediterranean style diet recommended  Gastroesophageal reflux disease (GERD) Symptoms controlled with pantoprazole  twice daily. Dose reduction increased symptoms, necessitating continued dosing. - Continue pantoprazole  40 mg twice daily.  Chronic constipation Managed with daily Miralax, effective for regular bowel movements. - Continue Miralax daily.  History of nonadvanced adenomatous polyp Surveillance colonoscopy deferred based on age  23 minutes total spent today including patient facing time, coordination of care, reviewing medical history/procedures/pertinent radiology studies, and documentation of the encounter.

## 2023-09-10 NOTE — Patient Instructions (Signed)
 Your provider has requested that you go to the basement level for lab work before leaving today. Press B on the elevator. The lab is located at the first door on the left as you exit the elevator.  Continue pantoprazole  twice daily.   Continue Miralax daily.   _______________________________________________________  If your blood pressure at your visit was 140/90 or greater, please contact your primary care physician to follow up on this.  _______________________________________________________  If you are age 7 or older, your body mass index should be between 23-30. Your Body mass index is 39.3 kg/m. If this is out of the aforementioned range listed, please consider follow up with your Primary Care Provider.  If you are age 32 or younger, your body mass index should be between 19-25. Your Body mass index is 39.3 kg/m. If this is out of the aformentioned range listed, please consider follow up with your Primary Care Provider.   ________________________________________________________  The  GI providers would like to encourage you to use MYCHART to communicate with providers for non-urgent requests or questions.  Due to long hold times on the telephone, sending your provider a message by [facility] may be a faster and more efficient way to get a response.  Please allow 48 business hours for a response.  Please remember that this is for non-urgent requests.  _______________________________________________________

## 2023-09-11 ENCOUNTER — Ambulatory Visit: Payer: Self-pay | Admitting: Internal Medicine

## 2023-09-11 LAB — FIB-4
ALT: 35 IU/L (ref 0–44)
AST: 33 IU/L (ref 0–40)
FIB-4 Index: 2.4 (ref 0.00–2.67)
Platelets: 193 x10E3/uL (ref 150–450)

## 2023-09-15 LAB — ANTI-NUCLEAR AB-TITER (ANA TITER): ANA Titer 1: 1:320 {titer} — ABNORMAL HIGH

## 2023-09-15 LAB — HEPATITIS B SURFACE ANTIGEN: Hepatitis B Surface Ag: NONREACTIVE

## 2023-09-15 LAB — IGG: IgG (Immunoglobin G), Serum: 1388 mg/dL (ref 600–1540)

## 2023-09-15 LAB — HEPATITIS B SURFACE ANTIBODY,QUALITATIVE: Hep B S Ab: NONREACTIVE

## 2023-09-15 LAB — HEPATITIS C ANTIBODY: Hepatitis C Ab: NONREACTIVE

## 2023-09-15 LAB — ANA: Anti Nuclear Antibody (ANA): POSITIVE — AB

## 2023-10-30 ENCOUNTER — Ambulatory Visit (INDEPENDENT_AMBULATORY_CARE_PROVIDER_SITE_OTHER): Payer: Medicare PPO | Admitting: *Deleted

## 2023-10-30 VITALS — Ht 68.0 in | Wt 253.3 lb

## 2023-10-30 DIAGNOSIS — Z0000 Encounter for general adult medical examination without abnormal findings: Secondary | ICD-10-CM

## 2023-10-30 NOTE — Progress Notes (Signed)
 Subjective:   [name] is a 83 y.o. who presents for a Medicare Wellness preventive visit.  As a reminder, Annual Wellness Visits don't include a physical exam, and some assessments may be limited, especially if this visit is performed virtually. We may recommend an in-person follow-up visit with your provider if needed.  Visit Complete: Virtual I connected with  [name] on 10/30/23 by a audio enabled telemedicine application and verified that I am speaking with the correct person using two identifiers.  Patient Location: Home  Provider Location: Home Office  I discussed the limitations of evaluation and management by telemedicine. The patient expressed understanding and agreed to proceed.  Vital Signs: Because this visit was a virtual/telehealth visit, some criteria may be missing or patient reported. Any vitals not documented were not able to be obtained and vitals that have been documented are patient reported.  VideoDeclined- This patient declined Librarian, academic. Therefore the visit was completed with audio only.  Persons Participating in Visit: Patient.  AWV Questionnaire: Yes: Patient Medicare AWV questionnaire was completed by the patient on 10/29/23; I have confirmed that all information answered by patient is correct and no changes since this date.  Cardiac Risk Factors include: advanced age (>71men, >68 women);male gender;hypertension;dyslipidemia;obesity (BMI >30kg/m2)     Objective:    Today's Vitals   10/30/23 0855  Weight: 253 lb 5 oz (114.9 kg)  Height: 5' 8 (1.727 m)   Body mass index is 38.52 kg/m.     12/27/2022   10:03 AM 10/26/2022    9:17 AM 06/27/2022   10:11 AM 10/24/2021    2:02 PM 09/01/2021   11:50 AM 06/05/2021    3:04 AM 06/03/2021    1:01 PM  Advanced Directives  Does Patient Have a Medical Advance Directive? No No No No No No No  Would patient like information on creating a medical advance directive? No  - Patient declined No - Patient declined No - Patient declined No - Patient declined  No - Patient declined No - Patient declined    Current Medications (verified) Outpatient Encounter Medications as of 10/30/2023  Medication Sig   acetaminophen (TYLENOL) 500 MG tablet Take 500 mg by mouth every 6 (six) hours as needed.   aspirin 81 MG tablet Take 81 mg by mouth daily.   carvedilol  (COREG ) 6.25 MG tablet Take 1 tablet (6.25 mg total) by mouth 2 (two) times daily with a meal.   fluticasone (FLONASE) 50 MCG/ACT nasal spray Place into both nostrils daily.   MITIGARE  0.6 MG CAPS TAKE 0.6 MG BY MOUTH DAILY AS NEEDED (GOUT FLARE).   Multiple Vitamins-Minerals (CENTRUM SILVER 50+MEN PO) Take by mouth.   pantoprazole  (PROTONIX ) 40 MG tablet Take 1 tablet (40 mg total) by mouth 2 (two) times daily.   polyethylene glycol (MIRALAX / GLYCOLAX) 17 g packet Take 17 g by mouth daily.   tamsulosin  (FLOMAX ) 0.4 MG CAPS capsule Take 1 capsule (0.4 mg total) by mouth daily.   valsartan  (DIOVAN ) 320 MG tablet Take 1 tablet (320 mg total) by mouth daily.   No facility-administered encounter medications on file as of 10/30/2023.    Allergies (verified) Penicillins   History: Past Medical History:  Diagnosis Date   Allergy    Anxiety    Arthritis    Cataract    beginning stage bilateral   Diverticulosis    Elevated PSA    Esophageal stricture    Esophagitis    GERD (gastroesophageal  reflux disease)    Gout    History of kidney stones    Hyperlipidemia    Hypertension    Inguinal hernia    Internal hemorrhoids    Pancreatitis    Prostate cancer (HCC)    Tubular adenoma of colon    Past Surgical History:  Procedure Laterality Date   BACK SURGERY  1980's   L4/5   BALLOON DILATION N/A 11/17/2018   Procedure: URETHRAL BALLOON DILATION;  Surgeon: Penne Knee, MD;  Location: [facility];  Service: Urology;  Laterality: N/A;   CARPAL TUNNEL RELEASE Right 08/15/2016   Procedure: CARPAL TUNNEL  RELEASE ENDOSCOPIC;  Surgeon: Edie Norleen PARAS, MD;  Location: [facility];  Service: Orthopedics;  Laterality: Right;   CATARACT EXTRACTION W/PHACO Right 03/14/2021   Procedure: CATARACT EXTRACTION PHACO AND INTRAOCULAR LENS PLACEMENT (IOC) RIGHT 6.84 00:52.3;  Surgeon: Jaye Fallow, MD;  Location: [facility];  Service: Ophthalmology;  Laterality: Right;   CATARACT EXTRACTION W/PHACO Left 03/28/2021   Procedure: CATARACT EXTRACTION PHACO AND INTRAOCULAR LENS PLACEMENT (IOC) LEFT;  Surgeon: Jaye Fallow, MD;  Location: [facility];  Service: Ophthalmology;  Laterality: Left;   COLONOSCOPY     CYSTOSCOPY N/A 11/17/2018   Procedure: CYSTOSCOPY;  Surgeon: Penne Knee, MD;  Location: [facility];  Service: Urology;  Laterality: N/A;   CYSTOSCOPY WITH LITHOLAPAXY N/A 10/09/2017   Procedure: CYSTOSCOPY WITH LITHOLAPAXY;  Surgeon: Penne Knee, MD;  Location: [facility];  Service: Urology;  Laterality: N/A;   ESOPHAGOGASTRODUODENOSCOPY  11-21-06   Esophagitis, Stricture/dilated    HOLEP-LASER ENUCLEATION OF THE PROSTATE WITH MORCELLATION N/A 10/09/2017   Procedure: HOLEP-LASER ENUCLEATION OF THE PROSTATE WITH MORCELLATION;  Surgeon: Penne Knee, MD;  Location: [facility];  Service: Urology;  Laterality: N/A;   LYMPH NODE BIOPSY  1957   benign  lymph node removed   Right arm fracture     TONSILLECTOMY     TOTAL KNEE ARTHROPLASTY  09-02-02   Bilateral   UPPER GASTROINTESTINAL ENDOSCOPY     Family History  Problem Relation Age of Onset   Ulcers Father    Diverticulitis Father    Arthritis Father    Hypertension Brother    Obesity Brother    Cancer Brother        throat   Arthritis Mother    Depression Mother        Anxiety   Colon cancer Neg Hx    Colon polyps Neg Hx    Esophageal cancer Neg Hx    Rectal cancer Neg Hx    Stomach cancer Neg Hx    Kidney disease Neg Hx    Kidney cancer Neg Hx    Prostate cancer Neg Hx    Social History   Socioeconomic  History   Marital status: Married    Spouse name: Not on file   Number of children: 1   Years of education: Not on file   Highest education level: Bachelor's degree (e.g., BA, AB, BS)  Occupational History   Occupation: Retired Designer, jewellery: [employer]    Comment: sales floors   Tobacco Use   Smoking status: Never    Passive exposure: Never   Smokeless tobacco: Never  Vaping Use   Vaping status: Never Used  Substance and Sexual Activity   Alcohol use: No   Drug use: No   Sexual activity: Yes  Other Topics Concern   Not on file  Social History Narrative   Lives in [place] with wife.  From [place], [place]. 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 Daughter, Allean, lives in [place]. No pets.      Exercise: regular   Diet: increased sweets   Social Drivers of Corporate investment banker Strain: Low Risk  (10/29/2023)   Overall Financial Resource Strain (CARDIA)    Difficulty of Paying Living Expenses: Not hard at all  Food Insecurity: No Food Insecurity (10/29/2023)   Hunger Vital Sign    Worried About Running Out of Food in the Last Year: Never true    Ran Out of Food in the Last Year: Never true  Transportation Needs: No Transportation Needs (10/29/2023)   PRAPARE - Administrator, Civil Service (Medical): No    Lack of Transportation (Non-Medical): No  Physical Activity: Inactive (10/29/2023)   Exercise Vital Sign    Days of Exercise per Week: 0 days    Minutes of Exercise per Session: 0 min  Stress: No Stress Concern Present (10/29/2023)   [employer] of Occupational Health - Occupational Stress Questionnaire    Feeling of Stress: Not at all  Social Connections: Socially Integrated (10/29/2023)   Social Connection and Isolation Panel    Frequency of Communication with Friends and Family: More than three times a week    Frequency of Social Gatherings with Friends and Family: More than three times a week    Attends Religious Services:  More than 4 times per year    Active Member of [employer] or Organizations: Yes    Attends Engineer, structural: More than 4 times per year    Marital Status: Married    Tobacco Counseling Counseling given: Not Answered    Clinical Intake:  Pre-visit preparation completed: Yes  Pain : No/denies pain     BMI - recorded: 38.52 Nutritional Status: BMI > 30  Obese Nutritional Risks: None Diabetes: No  Lab Results  Component Value Date   HGBA1C 5.6 06/26/2006     How often do you need to have someone help you when you read instructions, pamphlets, or other written materials from your doctor or pharmacy?: 1 - Never  Interpreter Needed?: No  Information entered by :: R. Lizzete Gough LPN   Activities of Daily Living     10/29/2023    8:12 PM  In your present state of health, do you have any difficulty performing the following activities:  Hearing? 0  Vision? 0  Difficulty concentrating or making decisions? 0  Walking or climbing stairs? 0  Dressing or bathing? 0  Doing errands, shopping? 0  Preparing Food and eating ? N  Using the Toilet? N  In the past six months, have you accidently leaked urine? Y  Do you have problems with loss of bowel control? N  Managing your Medications? N  Managing your Finances? N  Housekeeping or managing your Housekeeping? N    Patient Care Team: Gretel App, NP as PCP - General (Nurse Practitioner) Darliss Rogue, MD as PCP - Cardiology (Cardiology) Jaye Fallow, MD as Referring Physician (Ophthalmology) Melodi Lerner, MD (Orthopedic Surgery) [name], Gordy HERO, MD as Consulting Physician (Gastroenterology) Penne Knee, MD (Inactive) as Consulting Physician (Urology) Isenstein, Arin L, MD (Dermatology)  I have updated your Care Teams any recent Medical Services you may have received from other providers in the past year.     Assessment:   This is a routine wellness examination for [facility].  Hearing/Vision screen Hearing  Screening - Comments:: No issues Vision Screening - Comments:: glasses  Goals Addressed             This Visit's Progress    Patient Stated       Wants to be more physically active       Depression Screen     10/30/2023    9:02 AM 06/18/2023    1:58 PM 02/08/2023   11:27 AM 12/10/2022   11:36 AM 10/26/2022    9:12 AM 09/10/2022    9:40 AM 08/10/2022   10:05 AM  PHQ 2/9 Scores  PHQ - 2 Score 0 0 0 0 0 0 0  PHQ- 9 Score 3 3 2 4 3  0 3    Fall Risk     10/29/2023    8:12 PM 06/18/2023    1:57 PM 02/08/2023   11:26 AM 12/10/2022   11:36 AM 10/26/2022    9:06 AM  Fall Risk   Falls in the past year? 0 0 0 0 0  Number falls in past yr: 0 0 0 0 0  Injury with Fall? 0 0 0 0 0  Risk for fall due to : No Fall Risks No Fall Risks No Fall Risks No Fall Risks No Fall Risks  Follow up Falls evaluation completed;Falls prevention discussed Falls evaluation completed Falls evaluation completed Falls evaluation completed Falls prevention discussed;Falls evaluation completed    MEDICARE RISK AT HOME:  Medicare Risk at Home Any stairs in or around the home?: (Patient-Rptd) Yes If so, are there any without handrails?: (Patient-Rptd) No Home free of loose throw rugs in walkways, pet beds, electrical cords, etc?: (Patient-Rptd) Yes Adequate lighting in your home to reduce risk of falls?: (Patient-Rptd) Yes Life alert?: (Patient-Rptd) No Use of a cane, Hellen or w/c?: (Patient-Rptd) Yes Grab bars in the bathroom?: (Patient-Rptd) Yes Shower chair or bench in shower?: (Patient-Rptd) No Elevated toilet seat or a handicapped toilet?: (Patient-Rptd) No  TIMED UP AND GO:  Was the test performed?  No  Cognitive Function: 6CIT completed    10/13/2019   10:53 AM 10/07/2017   11:18 AM 10/15/2016   10:27 AM 10/12/2015    2:28 PM  MMSE - Mini Mental State Exam  Not completed: Unable to complete     Orientation to time  5 5  5    Orientation to Place  5 5  5    Registration  3 3  3    Attention/  Calculation  5 5  5    Recall  3 3  3    Language- name 2 objects  2 2  2    Language- repeat  1 1 1   Language- follow 3 step command  3 3  3    Language- read & follow direction  1 1  1    Write a sentence  1 1  1    Copy design  1 1  1    Total score  30 30  30       Data saved with a previous flowsheet row definition        10/30/2023    9:07 AM 10/26/2022    9:18 AM 10/24/2021    2:48 PM 10/10/2018   10:47 AM  6CIT Screen  What Year? 0 points 0 points 0 points 0 points  What month? 0 points 0 points 0 points 0 points  What time? 0 points 0 points  0 points  Count back from 20 0 points 0 points  0 points  Months in reverse 0 points 0 points 0 points 0 points  Repeat phrase 0  points 0 points  0 points  Total Score 0 points 0 points  0 points    Immunizations Immunization History  Administered Date(s) Administered   Fluad Quad(high Dose 65+) 11/26/2018, 01/09/2019, 11/23/2020, 11/20/2021   Fluad Trivalent(High Dose 65+) 11/27/2022   INFLUENZA, HIGH DOSE SEASONAL PF 10/20/2016, 11/03/2017, 12/03/2017, 11/26/2019   Influenza Whole 01/03/2006, 12/24/2011   Influenza,inj,Quad PF,6+ Mos 12/15/2012, 10/28/2014   Influenza-Unspecified 12/25/2013, 11/08/2015, 10/20/2016   Moderna Covid-19 Fall Seasonal Vaccine 31yrs & older 12/07/2022   PFIZER(Purple Top)SARS-COV-2 Vaccination 03/26/2019, 04/16/2019, 12/07/2019, 07/01/2020   Pneumococcal Conjugate-13 06/30/2013   Pneumococcal Polysaccharide-23 06/21/2006   Respiratory Syncytial Virus Vaccine,Recomb Aduvanted(Arexvy) 11/29/2021   Td 08/19/2002   Tdap 08/13/2014, 01/27/2023   Zoster Recombinant(Shingrix) 10/20/2018, 01/09/2019   Zoster, Live 12/28/2009    Screening Tests Health Maintenance  Topic Date Due   COVID-19 Vaccine (8 - Pfizer risk 2024-25 season) 06/07/2023   Medicare Annual Wellness (AWV)  10/26/2023   INFLUENZA VACCINE  10/04/2023   DTaP/Tdap/Td (4 - Td or Tdap) 01/26/2033   Pneumococcal Vaccine: 50+ Years  Completed    Zoster Vaccines- Shingrix  Completed   HPV VACCINES  Aged Out   Meningococcal B Vaccine  Aged Out   Hepatitis C Screening  Discontinued    Health Maintenance  Health Maintenance Due  Topic Date Due   COVID-19 Vaccine (8 - Pfizer risk 2024-25 season) 06/07/2023   Medicare Annual Wellness (AWV)  10/26/2023   INFLUENZA VACCINE  10/04/2023   Health Maintenance Items Addressed: Discussed the need to update flu and covid vaccines.   Additional Screening:  Vision Screening: Recommended annual ophthalmology exams for early detection of glaucoma and other disorders of the eye.Up to date [facility] Would you like a referral to an eye doctor? No    Dental Screening: Recommended annual dental exams for proper oral hygiene  Community Resource Referral / Chronic Care Management: CRR required this visit?  No   CCM required this visit?  No   Plan:    I have personally reviewed and noted the following in the patient's chart:   Medical and social history Use of alcohol, tobacco or illicit drugs  Current medications and supplements including opioid prescriptions. Patient is not currently taking opioid prescriptions. Functional ability and status Nutritional status Physical activity Advanced directives List of other physicians Hospitalizations, surgeries, and ER visits in previous 12 months Vitals Screenings to include cognitive, depression, and falls Referrals and appointments  In addition, I have reviewed and discussed with patient certain preventive protocols, quality metrics, and best practice recommendations. A written personalized care plan for preventive services as well as general preventive health recommendations were provided to patient.   Angeline Fredericks, LPN   [id number]/72/[id number]   After Visit Summary: (MyChart) Due to this being a telephonic visit, the after visit summary with patients personalized plan was offered to patient via MyChart   Notes: Nothing significant to report at  this time.

## 2023-10-30 NOTE — Patient Instructions (Signed)
 Mr. Kruszka , Thank you for taking time out of your busy schedule to complete your Annual Wellness Visit with me. I enjoyed our conversation and look forward to speaking with you again next year. I, as well as your care team,  appreciate your ongoing commitment to your health goals. Please review the following plan we discussed and let me know if I can assist you in the future. Your Game plan/ To Do List    Referrals: If you haven't heard from the office you've been referred to, please reach out to them at the phone provided.  Remember to update your flu and covid vaccines Follow up Visits: We will see or speak with you next year for your Next Medicare AWV with our clinical staff 11/03/24 @ 1:00 Have you seen your provider in the last 6 months (3 months if uncontrolled diabetes)? Yes  Clinician Recommendations:  Aim for 30 minutes of exercise or brisk walking, 6-8 glasses of water, and 5 servings of fruits and vegetables each day.       This is a list of the screenings recommended for you:  Health Maintenance  Topic Date Due   COVID-19 Vaccine (8 - Pfizer risk 2024-25 season) 06/07/2023   Flu Shot  10/04/2023   Medicare Annual Wellness Visit  10/29/2024   DTaP/Tdap/Td vaccine (4 - Td or Tdap) 01/26/2033   Pneumococcal Vaccine for age over 62  Completed   Zoster (Shingles) Vaccine  Completed   HPV Vaccine  Aged Out   Meningitis B Vaccine  Aged Out   Hepatitis C Screening  Discontinued    Advanced directives: (ACP Link)Information on Advanced Care Planning can be found at [place]  Best boy Advance Health Care Directives Advance Health Care Directives. [email or web address]  Advance Care Planning is important because it:  [x]  Makes sure you receive the medical care that is consistent with your values, goals, and preferences  [x]  It provides guidance to your family and loved ones and reduces their decisional burden about whether or not they are making the right decisions based on your  wishes.  Follow the link provided in your after visit summary or read over the paperwork we have mailed to you to help you started getting your Advance Directives in place. If you need assistance in completing these, please reach out to us  so that we can help you!

## 2023-11-12 ENCOUNTER — Encounter: Payer: Self-pay | Admitting: Internal Medicine

## 2023-11-12 ENCOUNTER — Encounter: Payer: Self-pay | Admitting: Nurse Practitioner

## 2023-11-12 ENCOUNTER — Ambulatory Visit: Admitting: Internal Medicine

## 2023-11-12 ENCOUNTER — Other Ambulatory Visit

## 2023-11-12 VITALS — BP 138/70 | HR 66 | Ht 68.0 in | Wt 257.4 lb

## 2023-11-12 DIAGNOSIS — K7581 Nonalcoholic steatohepatitis (NASH): Secondary | ICD-10-CM

## 2023-11-12 DIAGNOSIS — K5900 Constipation, unspecified: Secondary | ICD-10-CM

## 2023-11-12 DIAGNOSIS — K5909 Other constipation: Secondary | ICD-10-CM

## 2023-11-12 DIAGNOSIS — K219 Gastro-esophageal reflux disease without esophagitis: Secondary | ICD-10-CM | POA: Diagnosis not present

## 2023-11-12 DIAGNOSIS — K7400 Hepatic fibrosis, unspecified: Secondary | ICD-10-CM

## 2023-11-12 NOTE — Progress Notes (Signed)
   Subjective:    Patient ID: [name], male    DOB: Jul 30, 1940, 83 y.o.   MRN: [id number]  HPI [name] is an 83 year old male with hepatic steatosis and possible early cirrhosis who presents for follow-up.  He also has a history of constipation, diverticulosis, GERD, colonic polyps, prior pancreatitis, prostate cancer, BPH and gout.  He is here today with his wife.  I last saw him on 09/10/2023.  Recent lab work showed a fib-4 index of 2.4, which was indeterminate. ANA was positive at 1:320 with a nuclear dense, fine speckled pattern. Liver enzymes were normal with AST at 30 and ALT at 31. Total bilirubin was 0.5, albumin was 4.3, platelet count was normal at 199, INR was 1.2, viral hepatitis serologies were negative, and IgG was normal at 13.1988.  He experienced several bowel movements yesterday, which he attributes to eating some old watermelon. He usually has bowel movements two to three times a day and takes Miralax daily to manage constipation. He wants to avoid past constipation issues.  He continues to take medication for gastroesophageal reflux disease (GERD) and reports no trouble with it.  His social history includes decreased physical activity, which he acknowledges may contribute to slower bowel movements.  No increased abdominal or lower extremity edema.  No jaundice.  No itching.  No dark urine.  No blood in stool or melena.   Review of Systems As per HPI, otherwise negative  Current Medications, Allergies, Past Medical History, Past Surgical History, Family History and Social History were reviewed in [employer] record.    Objective:   Physical Exam BP 138/70   Pulse 66   Ht 5' 8 (1.727 m)   Wt 257 lb 6.4 oz (116.8 kg)   BMI 39.14 kg/m  Gen: awake, alert, NAD HEENT: anicteric  Abd: soft, NT/ND, +BS throughout Ext: no c/c/e Neuro: nonfocal, no asterixis  ABS   Fib-4 Index: 2.4 (09/10/2023)   ANA: 1:320, nuclear dense fine speckled  pattern (09/10/2023)   AST: 30 (09/10/2023)   ALT: 31 (09/10/2023)   Total Bilirubin: 0.5 (09/10/2023)   Albumin: 4.3 (09/10/2023)   Platelet Count: 199 (09/10/2023)   INR: 1.2 (09/10/2023)   Viral Hepatitis Serologies: Negative (09/10/2023)   IgG: 13 g/L (09/10/2023)         Assessment & Plan:   83 year old male with hepatic steatosis and possible early cirrhosis who presents for follow-up.  He also has a history of constipation, diverticulosis, GERD, colonic polyps, prior pancreatitis, prostate cancer, BPH and gout.  Hepatic steatosis with possible early cirrhosis/MASLD No definitive evidence of cirrhosis and certainly no evidence clinically of portal hypertension. Fib-4 index indeterminate, normal liver enzymes, positive ANA without symptoms.  Normal IgG.  Normal platelets.  Normal albumin.  Current labs do not indicate complications. - Order ELF lab test. - Schedule FibroScan when available. - Follow up in six months. - Risk factor modification in the setting of metabolic associated steatotic liver disease.  Constipation Improved with daily Miralax, occasional diarrhea likely dietary. - Continue Miralax to prevent constipation recurrence.  Gastroesophageal reflux disease (GERD) No issues with current reflux medication, symptoms well-managed. - Continue pantoprazole  40 mg once to twice daily  Follow-Up Plans discussed for monitoring liver condition and management of constipation and GERD. - Follow up in six months.  30 minutes total spent today including patient facing time, coordination of care, reviewing medical history/procedures/pertinent radiology studies, and documentation of the encounter.

## 2023-11-12 NOTE — Patient Instructions (Addendum)
 Your provider has requested that you go to the basement level for lab work before leaving today. Press B on the elevator. The lab is located at the first door on the left as you exit the elevator.  We have put you on a list to be scheduled for the Fibroscan when it becomes available at our office.   Continue your pantoprazole  twice daily and Miralax daily.   _______________________________________________________  If your blood pressure at your visit was 140/90 or greater, please contact your primary care physician to follow up on this.  _______________________________________________________  If you are age 2 or older, your body mass index should be between 23-30. Your Body mass index is 39.14 kg/m. If this is out of the aforementioned range listed, please consider follow up with your Primary Care Provider.  If you are age 68 or younger, your body mass index should be between 19-25. Your Body mass index is 39.14 kg/m. If this is out of the aformentioned range listed, please consider follow up with your Primary Care Provider.   ________________________________________________________  The [facility] providers would like to encourage you to use MYCHART to communicate with providers for non-urgent requests or questions.  Due to long hold times on the telephone, sending your provider a message by [facility] may be a faster and more efficient way to get a response.  Please allow 48 business hours for a response.  Please remember that this is for non-urgent requests.  _______________________________________________________  [facility] is using a team-based approach to care.  Your team is made up of your doctor and two to three APPS. Our APPS (Nurse Practitioners and Physician Assistants) work with your physician to ensure care continuity for you. They are fully qualified to address your health concerns and develop a treatment plan. They communicate directly with your  gastroenterologist to care for you. Seeing the Advanced Practice Practitioners on your physician's team can help you by facilitating care more promptly, often allowing for earlier appointments, access to diagnostic testing, procedures, and other specialty referrals.

## 2023-11-13 DIAGNOSIS — M9904 Segmental and somatic dysfunction of sacral region: Secondary | ICD-10-CM | POA: Diagnosis not present

## 2023-11-13 DIAGNOSIS — M4306 Spondylolysis, lumbar region: Secondary | ICD-10-CM | POA: Diagnosis not present

## 2023-11-13 DIAGNOSIS — M9902 Segmental and somatic dysfunction of thoracic region: Secondary | ICD-10-CM | POA: Diagnosis not present

## 2023-11-13 DIAGNOSIS — M9903 Segmental and somatic dysfunction of lumbar region: Secondary | ICD-10-CM | POA: Diagnosis not present

## 2023-11-13 DIAGNOSIS — M9901 Segmental and somatic dysfunction of cervical region: Secondary | ICD-10-CM | POA: Diagnosis not present

## 2023-11-13 DIAGNOSIS — M9907 Segmental and somatic dysfunction of upper extremity: Secondary | ICD-10-CM | POA: Diagnosis not present

## 2023-11-13 DIAGNOSIS — M9905 Segmental and somatic dysfunction of pelvic region: Secondary | ICD-10-CM | POA: Diagnosis not present

## 2023-11-13 DIAGNOSIS — M4302 Spondylolysis, cervical region: Secondary | ICD-10-CM | POA: Diagnosis not present

## 2023-11-13 DIAGNOSIS — M531 Cervicobrachial syndrome: Secondary | ICD-10-CM | POA: Diagnosis not present

## 2023-11-13 LAB — ENHANCED LIVER FIBROSIS (ELF): ELF(TM) Score: 10.38 — ABNORMAL HIGH (ref <9.80)

## 2023-11-14 ENCOUNTER — Ambulatory Visit: Payer: Self-pay | Admitting: Internal Medicine

## 2023-11-14 ENCOUNTER — Other Ambulatory Visit: Payer: Self-pay

## 2023-11-14 ENCOUNTER — Other Ambulatory Visit: Payer: Self-pay | Admitting: Nurse Practitioner

## 2023-11-14 DIAGNOSIS — Z23 Encounter for immunization: Secondary | ICD-10-CM

## 2023-11-14 MED ORDER — COVID-19 MRNA VAC-TRIS(PFIZER) 30 MCG/0.3ML IM SUSY: 0.3000 mL | PREFILLED_SYRINGE | Freq: Once | INTRAMUSCULAR | 0 refills | Status: DC

## 2023-11-14 MED ORDER — COVID-19 MRNA VACC (MODERNA) 50 MCG/0.5ML IM SUSY: 0.5000 mL | PREFILLED_SYRINGE | Freq: Once | INTRAMUSCULAR | 0 refills | Status: AC

## 2023-11-18 ENCOUNTER — Other Ambulatory Visit: Payer: Self-pay

## 2023-11-19 DIAGNOSIS — R809 Proteinuria, unspecified: Secondary | ICD-10-CM | POA: Diagnosis not present

## 2023-11-19 DIAGNOSIS — N200 Calculus of kidney: Secondary | ICD-10-CM | POA: Diagnosis not present

## 2023-11-19 DIAGNOSIS — M1000 Idiopathic gout, unspecified site: Secondary | ICD-10-CM | POA: Diagnosis not present

## 2023-11-19 DIAGNOSIS — N281 Cyst of kidney, acquired: Secondary | ICD-10-CM | POA: Diagnosis not present

## 2023-11-19 DIAGNOSIS — C61 Malignant neoplasm of prostate: Secondary | ICD-10-CM | POA: Diagnosis not present

## 2023-11-19 DIAGNOSIS — N400 Enlarged prostate without lower urinary tract symptoms: Secondary | ICD-10-CM | POA: Diagnosis not present

## 2023-11-19 DIAGNOSIS — I10 Essential (primary) hypertension: Secondary | ICD-10-CM | POA: Diagnosis not present

## 2023-11-19 DIAGNOSIS — R3129 Other microscopic hematuria: Secondary | ICD-10-CM | POA: Diagnosis not present

## 2023-11-19 DIAGNOSIS — N1832 Chronic kidney disease, stage 3b: Secondary | ICD-10-CM | POA: Diagnosis not present

## 2023-11-21 DIAGNOSIS — M1612 Unilateral primary osteoarthritis, left hip: Secondary | ICD-10-CM | POA: Diagnosis not present

## 2023-11-26 DIAGNOSIS — I10 Essential (primary) hypertension: Secondary | ICD-10-CM | POA: Diagnosis not present

## 2023-11-26 DIAGNOSIS — N400 Enlarged prostate without lower urinary tract symptoms: Secondary | ICD-10-CM | POA: Diagnosis not present

## 2023-11-26 DIAGNOSIS — M1000 Idiopathic gout, unspecified site: Secondary | ICD-10-CM | POA: Diagnosis not present

## 2023-11-26 DIAGNOSIS — N200 Calculus of kidney: Secondary | ICD-10-CM | POA: Diagnosis not present

## 2023-11-26 DIAGNOSIS — R3129 Other microscopic hematuria: Secondary | ICD-10-CM | POA: Diagnosis not present

## 2023-11-26 DIAGNOSIS — R809 Proteinuria, unspecified: Secondary | ICD-10-CM | POA: Diagnosis not present

## 2023-11-26 DIAGNOSIS — N35919 Unspecified urethral stricture, male, unspecified site: Secondary | ICD-10-CM | POA: Diagnosis not present

## 2023-11-26 DIAGNOSIS — Z8546 Personal history of malignant neoplasm of prostate: Secondary | ICD-10-CM | POA: Diagnosis not present

## 2023-11-26 DIAGNOSIS — N1832 Chronic kidney disease, stage 3b: Secondary | ICD-10-CM | POA: Diagnosis not present

## 2023-11-27 ENCOUNTER — Encounter: Payer: Self-pay | Admitting: Cardiology

## 2023-11-27 ENCOUNTER — Ambulatory Visit: Attending: Cardiology | Admitting: Cardiology

## 2023-11-27 ENCOUNTER — Telehealth: Payer: Self-pay

## 2023-11-27 ENCOUNTER — Encounter: Payer: Self-pay | Admitting: Nurse Practitioner

## 2023-11-27 VITALS — BP 140/68 | HR 64 | Ht 68.0 in | Wt 260.0 lb

## 2023-11-27 DIAGNOSIS — Z6839 Body mass index (BMI) 39.0-39.9, adult: Secondary | ICD-10-CM | POA: Diagnosis not present

## 2023-11-27 DIAGNOSIS — I77810 Thoracic aortic ectasia: Secondary | ICD-10-CM

## 2023-11-27 DIAGNOSIS — E669 Obesity, unspecified: Secondary | ICD-10-CM

## 2023-11-27 DIAGNOSIS — Z01810 Encounter for preprocedural cardiovascular examination: Secondary | ICD-10-CM

## 2023-11-27 DIAGNOSIS — I10 Essential (primary) hypertension: Secondary | ICD-10-CM | POA: Diagnosis not present

## 2023-11-27 NOTE — Telephone Encounter (Signed)
 Dr. Budd Kindle,  You saw this patient on 11/27/2023. Per protocol we request that you comment on his cardiac risk to proceed with LEFT TOTAL HIP ARTHROPLASTY on 02/10/2024, since it has been less than 2 months since evaluated in the office. Please send your comment to P CV Pre-Op Pool.  Thank you, Lamarr Satterfield DNP, ANP, AACC.

## 2023-11-27 NOTE — Progress Notes (Addendum)
 Cardiology Office Note:    Date:  11/27/2023   ID:  [name], DOB [birth year], MRN [id number]  PCP:  Gretel App, NP   [facility] Providers Cardiologist:  Redell Cave, MD     Referring MD: Gretel App, NP   Chief Complaint  Patient presents with   Follow-up    6 month follow up pt has been doing well with no complaints of chest pain, chest pressure or SOB, medciation reviewed verbally with patient    History of Present Illness:    [name] is a 83 y.o. male with a hx of hypertension, GERD, OSA on CPAP, prostate cancer s/p radiation therapy who presents for follow-up.  Compliant with BP medications as prescribed, states BP on last check was systolic 140s.  Denies chest pain or shortness of breath.  Endorses occasional headache, uses CPAP about 5 hours nightly.  Contacted by surgical team stating left hip arthroplasty is being planned for patient.  Prior notes Echo 9/24 EF 55 to 60%, mild ascending aorta dilatation 40 mm. Norvasc  caused leg edema, patient switched to carvedilol   Past Medical History:  Diagnosis Date   Allergy    Anxiety    Arthritis    Cataract    beginning stage bilateral   Diverticulosis    Elevated PSA    Esophageal stricture    Esophagitis    GERD (gastroesophageal reflux disease)    Gout    History of kidney stones    Hyperlipidemia    Hypertension    Inguinal hernia    Internal hemorrhoids    Pancreatitis    Prostate cancer (HCC)    Tubular adenoma of colon     Past Surgical History:  Procedure Laterality Date   BACK SURGERY  1980's   L4/5   BALLOON DILATION N/A 11/17/2018   Procedure: URETHRAL BALLOON DILATION;  Surgeon: Penne Knee, MD;  Location: [facility];  Service: Urology;  Laterality: N/A;   CARPAL TUNNEL RELEASE Right 08/15/2016   Procedure: CARPAL TUNNEL RELEASE ENDOSCOPIC;  Surgeon: Edie Norleen PARAS, MD;  Location: [facility];  Service: Orthopedics;  Laterality: Right;   CATARACT EXTRACTION  W/PHACO Right 03/14/2021   Procedure: CATARACT EXTRACTION PHACO AND INTRAOCULAR LENS PLACEMENT (IOC) RIGHT 6.84 00:52.3;  Surgeon: Jaye Fallow, MD;  Location: [facility];  Service: Ophthalmology;  Laterality: Right;   CATARACT EXTRACTION W/PHACO Left 03/28/2021   Procedure: CATARACT EXTRACTION PHACO AND INTRAOCULAR LENS PLACEMENT (IOC) LEFT;  Surgeon: Jaye Fallow, MD;  Location: [facility];  Service: Ophthalmology;  Laterality: Left;   COLONOSCOPY     CYSTOSCOPY N/A 11/17/2018   Procedure: CYSTOSCOPY;  Surgeon: Penne Knee, MD;  Location: [facility];  Service: Urology;  Laterality: N/A;   CYSTOSCOPY WITH LITHOLAPAXY N/A 10/09/2017   Procedure: CYSTOSCOPY WITH LITHOLAPAXY;  Surgeon: Penne Knee, MD;  Location: [facility];  Service: Urology;  Laterality: N/A;   ESOPHAGOGASTRODUODENOSCOPY  11-21-06   Esophagitis, Stricture/dilated    HOLEP-LASER ENUCLEATION OF THE PROSTATE WITH MORCELLATION N/A 10/09/2017   Procedure: HOLEP-LASER ENUCLEATION OF THE PROSTATE WITH MORCELLATION;  Surgeon: Penne Knee, MD;  Location: [facility];  Service: Urology;  Laterality: N/A;   LYMPH NODE BIOPSY  1957   benign  lymph node removed   Right arm fracture     TONSILLECTOMY     TOTAL KNEE ARTHROPLASTY  09-02-02   Bilateral   UPPER GASTROINTESTINAL ENDOSCOPY      Current Medications: Current Meds  Medication Sig   acetaminophen (TYLENOL) 500  MG tablet Take 500 mg by mouth every 6 (six) hours as needed.   aspirin 81 MG tablet Take 81 mg by mouth daily.   carvedilol  (COREG ) 6.25 MG tablet Take 1 tablet (6.25 mg total) by mouth 2 (two) times daily with a meal.   fluticasone (FLONASE) 50 MCG/ACT nasal spray Place into both nostrils daily.   MITIGARE  0.6 MG CAPS TAKE 0.6 MG BY MOUTH DAILY AS NEEDED (GOUT FLARE).   Multiple Vitamins-Minerals (CENTRUM SILVER 50+MEN PO) Take by mouth.   pantoprazole  (PROTONIX ) 40 MG tablet Take 1 tablet (40 mg total) by mouth 2 (two) times daily.    polyethylene glycol (MIRALAX / GLYCOLAX) 17 g packet Take 17 g by mouth daily.   tamsulosin  (FLOMAX ) 0.4 MG CAPS capsule Take 1 capsule (0.4 mg total) by mouth daily.   valsartan  (DIOVAN ) 320 MG tablet Take 1 tablet (320 mg total) by mouth daily.     Allergies:   Penicillins   Social History   Socioeconomic History   Marital status: Married    Spouse name: Not on file   Number of children: 1   Years of education: Not on file   Highest education level: Bachelor's degree (e.g., BA, AB, BS)  Occupational History   Occupation: Retired Designer, jewellery: [employer]    Comment: sales floors   Tobacco Use   Smoking status: Never    Passive exposure: Never   Smokeless tobacco: Never  Vaping Use   Vaping status: Never Used  Substance and Sexual Activity   Alcohol use: No   Drug use: No   Sexual activity: Yes  Other Topics Concern   Not on file  Social History Narrative   Lives in [place] with wife. From [place], [place]. 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 Daughter, Allean, lives in [place]. No pets.      Exercise: regular   Diet: increased sweets   Social Drivers of Corporate investment banker Strain: Low Risk  (10/29/2023)   Overall Financial Resource Strain (CARDIA)    Difficulty of Paying Living Expenses: Not hard at all  Food Insecurity: No Food Insecurity (10/29/2023)   Hunger Vital Sign    Worried About Running Out of Food in the Last Year: Never true    Ran Out of Food in the Last Year: Never true  Transportation Needs: No Transportation Needs (10/29/2023)   PRAPARE - Administrator, Civil Service (Medical): No    Lack of Transportation (Non-Medical): No  Physical Activity: Inactive (10/29/2023)   Exercise Vital Sign    Days of Exercise per Week: 0 days    Minutes of Exercise per Session: 0 min  Stress: No Stress Concern Present (10/29/2023)   [employer] of Occupational Health - Occupational Stress Questionnaire    Feeling of  Stress: Not at all  Social Connections: Socially Integrated (10/29/2023)   Social Connection and Isolation Panel    Frequency of Communication with Friends and Family: More than three times a week    Frequency of Social Gatherings with Friends and Family: More than three times a week    Attends Religious Services: More than 4 times per year    Active Member of [employer] or Organizations: Yes    Attends Engineer, structural: More than 4 times per year    Marital Status: Married     Family History: The patient's family history includes Arthritis in his father and mother; Cancer in his brother; Depression  in his mother; Diverticulitis in his father; Hypertension in his brother; Obesity in his brother; Ulcers in his father. There is no history of Colon cancer, Colon polyps, Esophageal cancer, Rectal cancer, Stomach cancer, Kidney disease, Kidney cancer, or Prostate cancer.  ROS:   Please see the history of present illness.     All other systems reviewed and are negative.  EKGs/Labs/Other Studies Reviewed:    The following studies were reviewed today:  EKG Interpretation Date/Time:  Wednesday November 27 2023 10:54:15 EDT Ventricular Rate:  64 PR Interval:  144 QRS Duration:  72 QT Interval:  404 QTC Calculation: 416 R Axis:   -38  Text Interpretation: Normal sinus rhythm with sinus arrhythmia Left axis deviation Confirmed by Darliss Rogue ([id number]) on 11/27/2023 11:15:21 AM    Recent Labs: 09/10/2023: ALT 35; BUN 17; Creatinine, Ser 1.47; Hemoglobin 13.0; Platelets 193; Potassium 4.4; Sodium 138  Recent Lipid Panel    Component Value Date/Time   CHOL 177 03/16/2022 1042   CHOL 118 08/31/2013 0401   TRIG 160.0 (H) 03/16/2022 1042   TRIG 59 08/31/2013 0401   HDL 36.20 (L) 03/16/2022 1042   HDL 33 (L) 08/31/2013 0401   CHOLHDL 5 03/16/2022 1042   VLDL 32.0 03/16/2022 1042   VLDL 12 08/31/2013 0401   LDLCALC 109 (H) 03/16/2022 1042   LDLCALC 73 08/31/2013 0401    LDLDIRECT 104.5 05/07/2011 1415     Risk Assessment/Calculations:            Physical Exam:    VS:  BP (!) 140/68 (BP Location: Left Arm, Patient Position: Sitting, Cuff Size: Large)   Pulse 64   Ht 5' 8 (1.727 m)   Wt 260 lb (117.9 kg)   SpO2 98%   BMI 39.53 kg/m     Wt Readings from Last 3 Encounters:  11/27/23 260 lb (117.9 kg)  11/12/23 257 lb 6.4 oz (116.8 kg)  10/30/23 253 lb 5 oz (114.9 kg)     GEN:  Well nourished, well developed in no acute distress HEENT: Normal NECK: No JVD; No carotid bruits CARDIAC: RRR, no murmurs, rubs, gallops RESPIRATORY:  Clear to auscultation without rales, wheezing or rhonchi  ABDOMEN: Soft, non-tender, non-distended MUSCULOSKELETAL:  No edema; No deformity  SKIN: Warm and dry NEUROLOGIC:  Alert and oriented x 3 PSYCHIATRIC:  Normal affect   ASSESSMENT:    1. Mild ascending aorta dilatation   2. Primary hypertension   3. BMI 39.0-39.9,adult    PLAN:    In order of problems listed above:  Mild ascending aorta dilatation, 40 mm on echo 9/24.  Monitor serially with echoes. Hypertension, BP elevated, previously better.  Continue current meds for now, titrate Coreg  at follow-up visit if BP stays elevated.  Continue valsartan  320 mg daily, Coreg  6.25 mg twice daily. Obesity, low-calorie diet, increase activity advised.  Compliance with CPAP advised. Preprocedural exam, left hip arthroplasty being planned.  Okay for procedure from a cardiac perspective.  Follow-up in 6 months.      Medication Adjustments/Labs and Tests Ordered: Current medicines are reviewed at length with the patient today.  Concerns regarding medicines are outlined above.  Orders Placed This Encounter  Procedures   EKG 12-Lead   No orders of the defined types were placed in this encounter.   Patient Instructions  Medication Instructions:  Your physician recommends that you continue on your current medications as directed. Please refer to the Current  Medication list given to you today.   *If you need  a refill on your cardiac medications before your next appointment, please call your pharmacy*  Lab Work: No labs ordered today  If you have labs (blood work) drawn today and your tests are completely normal, you will receive your results only by: MyChart Message (if you have MyChart) OR A paper copy in the mail If you have any lab test that is abnormal or we need to change your treatment, we will call you to review the results.  Testing/Procedures: No test ordered today   Follow-Up: At [facility], you and your health needs are our priority.  As part of our continuing mission to provide you with exceptional heart care, our providers are all part of one team.  This team includes your primary Cardiologist (physician) and Advanced Practice Providers or APPs (Physician Assistants and Nurse Practitioners) who all work together to provide you with the care you need, when you need it.  Your next appointment:   6 month(s)  Provider:   You may see Redell Cave, MD or one of the following Advanced Practice Providers on your designated Care Team:   Lonni Meager, NP Lesley Maffucci, PA-C Bernardino Bring, PA-C Cadence Cleveland, PA-C Tylene Lunch, NP Barnie Hila, NP    We recommend signing up for the patient portal called MyChart.  Sign up information is provided on this After Visit Summary.  MyChart is used to connect with patients for Virtual Visits (Telemedicine).  Patients are able to view lab/test results, encounter notes, upcoming appointments, etc.  Non-urgent messages can be sent to your provider as well.   To learn more about what you can do with MyChart, go to [email or web address].            Signed, Redell Cave, MD  11/27/2023 12:11 PM    [facility]

## 2023-11-27 NOTE — Telephone Encounter (Signed)
 Pre op form placed in provider to be signed folder for review pt last seen 06-17-23 appt scheduled 12-19-23

## 2023-11-27 NOTE — Telephone Encounter (Signed)
 LOV 06/18/23

## 2023-11-27 NOTE — Patient Instructions (Signed)

## 2023-11-27 NOTE — Telephone Encounter (Signed)
   Pre-operative Risk Assessment    Patient Name: [name]  DOB: 16-Jan-1941 MRN: [id number]   Date of last office visit: 11/27/23 CAMPBELL CAVE, MD Date of next office visit: NONE   Request for Surgical Clearance    Procedure:  LEFT TOTAL HIP ARTHROPLASTY  Date of Surgery:  Clearance 02/10/24                                Surgeon:  DR DEMPSEY MOAN Surgeon's Group or Practice Name:  [facility] Phone number:  [phone] Fax number:  [phone]  ATTN: KERRI MAZE   Type of Clearance Requested:   - Medical  - Pharmacy:  Hold Aspirin     Type of Anesthesia:  CHOICE   Additional requests/questions:    SignedLucie DELENA Ku   11/27/2023, 11:47 AM

## 2023-11-27 NOTE — Telephone Encounter (Signed)
   Patient Name: [name]  DOB: [birth year] MRN: [id number]  Primary Cardiologist: Redell Cave, MD  Chart reviewed as part of pre-operative protocol coverage. Given past medical history and time since last visit, based on ACC/AHA guidelines, [name] is at acceptable risk for the planned procedure without further cardiovascular testing.   Per Dr.Agbor-Etang 11/27/2023 Preprocedural exam, left hip arthroplasty being planned. Okay for procedure from a cardiac perspective.   Per office protocol, if patient is without any new symptoms or concerns at the time of their virtual visit, he may hold ASA for 7 days prior to procedure. Please resume ASA as soon as possible postprocedure, at the discretion of the surgeon.    The patient was advised that if he develops new symptoms prior to surgery to contact our office to arrange for a follow-up visit, and he verbalized understanding.  I will route this recommendation to the requesting party via Epic fax function and remove from pre-op pool.  Please call with questions.  Lamarr Satterfield, NP 11/27/2023, 2:14 PM

## 2023-12-02 DIAGNOSIS — G4733 Obstructive sleep apnea (adult) (pediatric): Secondary | ICD-10-CM | POA: Diagnosis not present

## 2023-12-03 ENCOUNTER — Ambulatory Visit: Admitting: Adult Health

## 2023-12-03 DIAGNOSIS — G4733 Obstructive sleep apnea (adult) (pediatric): Secondary | ICD-10-CM | POA: Diagnosis not present

## 2023-12-03 DIAGNOSIS — N138 Other obstructive and reflux uropathy: Secondary | ICD-10-CM

## 2023-12-03 NOTE — Progress Notes (Unsigned)
 @Patient  ID: [name], male    DOB: Nov 01, 1940, 83 y.o.   MRN: [id number]  Chief Complaint  Patient presents with   Sleep Apnea    Referring provider: Gretel App, NP  HPI: 83 yo old male seen for sleep consult September 2024 for snoring found to have severe obstructive sleep apnea Medical history significant for prostate cancer    TEST/EVENTS :  HST 12/07/22 AHI 40.2, SpO2 low 74%.    Discussed the use of AI scribe software for clinical note transcription with the patient, who gave verbal consent to proceed.  History of Present Illness [name] is an 83 year old male with sleep apnea who presents for a follow-up visit regarding sleep apnea on CPAP.   He uses a CPAP machine for his sleep apnea, with supplies provided by [employer]. He uses nasal pillows and experiences occasional mask leaks. He typically uses the CPAP for about four and a half to five hours per night, although he falls asleep on the couch until his wife goes to bed, which affects his CPAP usage time. Feels he benefits from CPAP with improved sleep quality. Download shows 100% complaince with average usage 4.5hr . AHI 4.9/hr   He is scheduled for left hip surgery on December 8th and has previously had both knees replaced. He received a cortisone shot about six or seven weeks ago, which temporarily alleviated his hip pain, allowing him to perform activities like yard work. He plans to stay overnight in the hospital post-surgery. Fully independent at home . Not on oxygen.   He has received his flu and COVID vaccinations      Allergies  Allergen Reactions   Penicillins Rash    Did it involve swelling of the face/tongue/throat, SOB, or low BP? No Did it involve sudden or severe rash/hives, skin peeling, or any reaction on the inside of your mouth or nose? No Did you need to seek medical attention at a hospital or doctor's office? No When did it last happen?      60 Years If all above answers are NO, may  proceed with cephalosporin use.     Immunization History  Administered Date(s) Administered   Fluad Quad(high Dose 65+) 11/26/2018, 01/09/2019, 11/23/2020, 11/20/2021, 11/05/2023   Fluad Trivalent(High Dose 65+) 11/27/2022   INFLUENZA, HIGH DOSE SEASONAL PF 10/20/2016, 11/03/2017, 12/03/2017, 11/26/2019   Influenza Whole 01/03/2006, 12/24/2011   Influenza,inj,Quad PF,6+ Mos 12/15/2012, 10/28/2014   Influenza-Unspecified 12/25/2013, 11/08/2015, 10/20/2016   Moderna Covid-19 Fall Seasonal Vaccine 6yrs & older 12/07/2022   Moderna SARS-COV2 Booster Vaccination 11/19/2023   PFIZER(Purple Top)SARS-COV-2 Vaccination 03/26/2019, 04/16/2019, 12/07/2019, 07/01/2020   Pneumococcal Conjugate-13 06/30/2013   Pneumococcal Polysaccharide-23 06/21/2006   Respiratory Syncytial Virus Vaccine,Recomb Aduvanted(Arexvy) 11/29/2021   Td 08/19/2002   Tdap 08/13/2014, 01/27/2023   Zoster Recombinant(Shingrix) 10/20/2018, 01/09/2019   Zoster, Live 12/28/2009    Past Medical History:  Diagnosis Date   Allergy    Anxiety    Arthritis    Cataract    beginning stage bilateral   Diverticulosis    Elevated PSA    Esophageal stricture    Esophagitis    GERD (gastroesophageal reflux disease)    Gout    History of kidney stones    Hyperlipidemia    Hypertension    Inguinal hernia    Internal hemorrhoids    Pancreatitis    Prostate cancer (HCC)    Tubular adenoma of colon     Tobacco History: Social History   Tobacco  Use  Smoking Status Never   Passive exposure: Never  Smokeless Tobacco Never   Counseling given: Not Answered   Outpatient Medications Prior to Visit  Medication Sig Dispense Refill   acetaminophen (TYLENOL) 500 MG tablet Take 500 mg by mouth every 6 (six) hours as needed.     aspirin 81 MG tablet Take 81 mg by mouth daily.     carvedilol  (COREG ) 6.25 MG tablet Take 1 tablet (6.25 mg total) by mouth 2 (two) times daily with a meal. 180 tablet 3   fluticasone (FLONASE) 50  MCG/ACT nasal spray Place into both nostrils daily.     MITIGARE  0.6 MG CAPS TAKE 0.6 MG BY MOUTH DAILY AS NEEDED (GOUT FLARE). 90 capsule 1   Multiple Vitamins-Minerals (CENTRUM SILVER 50+MEN PO) Take by mouth.     pantoprazole  (PROTONIX ) 40 MG tablet Take 1 tablet (40 mg total) by mouth 2 (two) times daily. 180 tablet 3   polyethylene glycol (MIRALAX / GLYCOLAX) 17 g packet Take 17 g by mouth daily.     valsartan  (DIOVAN ) 320 MG tablet Take 1 tablet (320 mg total) by mouth daily. 90 tablet 3   tamsulosin  (FLOMAX ) 0.4 MG CAPS capsule Take 1 capsule (0.4 mg total) by mouth daily. 90 capsule 3   No facility-administered medications prior to visit.     Review of Systems:   Constitutional:   No  weight loss, night sweats,  Fevers, chills, fatigue, or  lassitude.  HEENT:   No headaches,  Difficulty swallowing,  Tooth/dental problems, or  Sore throat,                No sneezing, itching, ear ache, nasal congestion, post nasal drip,   CV:  No chest pain,  Orthopnea, PND, swelling in lower extremities, anasarca, dizziness, palpitations, syncope.   GI  No heartburn, indigestion, abdominal pain, nausea, vomiting, diarrhea, change in bowel habits, loss of appetite, bloody stools.   Resp: No shortness of breath with exertion or at rest.  No excess mucus, no productive cough,  No non-productive cough,  No coughing up of blood.  No change in color of mucus.  No wheezing.  No chest wall deformity  Skin: no rash or lesions.  GU: no dysuria, change in color of urine, no urgency or frequency.  No flank pain, no hematuria   MS:  +joint pain     Physical Exam    GEN: A/Ox3; pleasant , NAD, well nourished    HEENT:  [place]/AT,   NOSE-clear, THROAT-clear, no lesions, no postnasal drip or exudate noted.   NECK:  Supple w/ fair ROM; no JVD; normal carotid impulses w/o bruits; no thyromegaly or nodules palpated; no lymphadenopathy.    RESP  Clear  P & A; w/o, wheezes/ rales/ or rhonchi. no accessory  muscle use, no dullness to percussion  CARD:  RRR, no m/r/g, no peripheral edema, pulses intact, no cyanosis or clubbing.  GI:   Soft & nt; nml bowel sounds; no organomegaly or masses detected.   Musco: Warm bil, no deformities or joint swelling noted.   Neuro: alert, no focal deficits noted.    Skin: Warm, no lesions or rashes    Lab Results:  CBC    BNP No results found for: BNP  ProBNP    Component Value Date/Time   PROBNP 48.0 08/15/2021 0832    Imaging: No results found.  Administration History     None           No  data to display          No results found for: NITRICOXIDE      Assessment & Plan:   Assessment and Plan Assessment & Plan Obstructive sleep apnea   Obstructive sleep apnea is well-controlled with CPAP therapy. . Continue current CPAP settings and usage. Maintain cleanliness and proper maintenance of CPAP mask and equipment.   Morbid obesity -healthy weight loss   Pre Op Pulm risk assessment - from pulmonary standpoint patient is stable and well controlled on CPAP. Would be low-moderate surgical risk from pulmonary standpoint, reviewed with patient. Recommend CPAP in post op setting if needed and use At bedtime  and with naps. Use caution with sedating medications.   Plan  Patient Instructions  Continue on CPAP At bedtime, wear all night long for at least 6hr or more  Work on healthy weight loss Do not drive if sleepy Follow up in 1 year with Dr. Neda or Primo Innis NP and As needed          Madelin Stank NP

## 2023-12-03 NOTE — Patient Instructions (Addendum)
 Continue on CPAP At bedtime , wear all night long for at least 6hr or more .  Work on healthy weight loss Do not drive if sleepy  Follow up in 1 year with Dr. Neda or Duane Shallenberger NP and As needed

## 2023-12-04 MED ORDER — TAMSULOSIN HCL 0.4 MG PO CAPS
0.4000 mg | ORAL_CAPSULE | Freq: Every day | ORAL | 2 refills | Status: AC
Start: 2023-12-04 — End: ?

## 2023-12-11 DIAGNOSIS — M531 Cervicobrachial syndrome: Secondary | ICD-10-CM | POA: Diagnosis not present

## 2023-12-11 DIAGNOSIS — M9903 Segmental and somatic dysfunction of lumbar region: Secondary | ICD-10-CM | POA: Diagnosis not present

## 2023-12-11 DIAGNOSIS — M4306 Spondylolysis, lumbar region: Secondary | ICD-10-CM | POA: Diagnosis not present

## 2023-12-11 DIAGNOSIS — M9902 Segmental and somatic dysfunction of thoracic region: Secondary | ICD-10-CM | POA: Diagnosis not present

## 2023-12-11 DIAGNOSIS — M9904 Segmental and somatic dysfunction of sacral region: Secondary | ICD-10-CM | POA: Diagnosis not present

## 2023-12-11 DIAGNOSIS — M4302 Spondylolysis, cervical region: Secondary | ICD-10-CM | POA: Diagnosis not present

## 2023-12-11 DIAGNOSIS — M9907 Segmental and somatic dysfunction of upper extremity: Secondary | ICD-10-CM | POA: Diagnosis not present

## 2023-12-11 DIAGNOSIS — M9905 Segmental and somatic dysfunction of pelvic region: Secondary | ICD-10-CM | POA: Diagnosis not present

## 2023-12-11 DIAGNOSIS — M9901 Segmental and somatic dysfunction of cervical region: Secondary | ICD-10-CM | POA: Diagnosis not present

## 2023-12-19 ENCOUNTER — Ambulatory Visit: Admitting: Nurse Practitioner

## 2023-12-19 ENCOUNTER — Encounter: Payer: Self-pay | Admitting: Nurse Practitioner

## 2023-12-19 VITALS — BP 136/78 | HR 58 | Temp 98.4°F | Ht 68.0 in | Wt 259.6 lb

## 2023-12-19 DIAGNOSIS — M1612 Unilateral primary osteoarthritis, left hip: Secondary | ICD-10-CM | POA: Insufficient documentation

## 2023-12-19 DIAGNOSIS — G4733 Obstructive sleep apnea (adult) (pediatric): Secondary | ICD-10-CM | POA: Diagnosis not present

## 2023-12-19 DIAGNOSIS — M169 Osteoarthritis of hip, unspecified: Secondary | ICD-10-CM | POA: Insufficient documentation

## 2023-12-19 DIAGNOSIS — I10 Essential (primary) hypertension: Secondary | ICD-10-CM | POA: Diagnosis not present

## 2023-12-19 DIAGNOSIS — Z01818 Encounter for other preprocedural examination: Secondary | ICD-10-CM | POA: Insufficient documentation

## 2023-12-19 NOTE — Progress Notes (Signed)
 Leron Glance, NP-C Phone: [phone]  [name] is a 83 y.o. male who presents today for pre op evaluation.   Discussed the use of AI scribe software for clinical note transcription with the patient, who gave verbal consent to proceed.  History of Present Illness   [name] is an 83 year old male who presents for preoperative clearance for left total hip replacement.  He is scheduled for a left total hip replacement on December 8th. He is not in significant pain due to a high pain tolerance but is unable to engage in much physical activity. Previously, he experienced significant hip pain, which was alleviated by a cortisone shot, allowing him to perform some yard work.  He has a history of hypertension, with recent blood pressure readings consistently above 130 mmHg systolic, despite being on carvedilol  and valsartan . No chest pain, shortness of breath, dizziness, or swelling.  He reports feeling weak and lacking stamina, which is unusual for him as he is typically very active. This weakness has been present for the last couple of years, worsening over the past three to four months.  He is also followed by urology and pulmonology for sleep apnea, which was last reviewed at the end of September.      Social History   Tobacco Use  Smoking Status Never   Passive exposure: Never  Smokeless Tobacco Never    Current Outpatient Medications on File Prior to Visit  Medication Sig Dispense Refill   acetaminophen (TYLENOL) 500 MG tablet Take 500 mg by mouth every 6 (six) hours as needed.     aspirin 81 MG tablet Take 81 mg by mouth daily.     carvedilol  (COREG ) 6.25 MG tablet Take 1 tablet (6.25 mg total) by mouth 2 (two) times daily with a meal. 180 tablet 3   fluticasone (FLONASE) 50 MCG/ACT nasal spray Place into both nostrils daily.     MITIGARE  0.6 MG CAPS TAKE 0.6 MG BY MOUTH DAILY AS NEEDED (GOUT FLARE). 90 capsule 1   Multiple Vitamins-Minerals (CENTRUM SILVER 50+MEN PO)  Take by mouth.     pantoprazole  (PROTONIX ) 40 MG tablet Take 1 tablet (40 mg total) by mouth 2 (two) times daily. 180 tablet 3   polyethylene glycol (MIRALAX / GLYCOLAX) 17 g packet Take 17 g by mouth daily.     tamsulosin  (FLOMAX ) 0.4 MG CAPS capsule Take 1 capsule (0.4 mg total) by mouth daily. 90 capsule 2   valsartan  (DIOVAN ) 320 MG tablet Take 1 tablet (320 mg total) by mouth daily. 90 tablet 3   No current facility-administered medications on file prior to visit.     ROS see history of present illness  Objective  Physical Exam Vitals:   12/19/23 1316  BP: 136/78  Pulse: (!) 58  Temp: 98.4 F (36.9 C)  SpO2: 98%    BP Readings from Last 3 Encounters:  12/19/23 136/78  11/27/23 (!) 140/68  11/12/23 138/70   Wt Readings from Last 3 Encounters:  12/19/23 259 lb 9.6 oz (117.8 kg)  11/27/23 260 lb (117.9 kg)  11/12/23 257 lb 6.4 oz (116.8 kg)    Physical Exam Constitutional:      General: He is not in acute distress.    Appearance: Normal appearance.  HENT:     Head: Normocephalic.  Cardiovascular:     Rate and Rhythm: Normal rate and regular rhythm.     Heart sounds: Normal heart sounds.  Pulmonary:     Effort: Pulmonary effort  is normal.     Breath sounds: Normal breath sounds.  Skin:    General: Skin is warm and dry.  Neurological:     General: No focal deficit present.     Mental Status: He is alert.  Psychiatric:        Mood and Affect: Mood normal.        Behavior: Behavior normal.      Assessment/Plan: Please see individual problem list.  Preoperative clearance Assessment & Plan: He is scheduled for left total hip arthroplasty on December 8th due to osteoarthritis. Cleared by cardiology with instructions to hold aspirin for seven days prior. Medical clearance is provided from a primary care perspective. Fax preoperative clearance paperwork to the appropriate department. Instruct to hold aspirin for seven days before surgery.   Osteoarthritis of  left hip, unspecified osteoarthritis type Assessment & Plan: Total hip replacement scheduled for Dec. 8th.   Essential hypertension Assessment & Plan: His hypertension is not optimally controlled, with systolic readings consistently above 130 mmHg despite carvedilol  and valsartan . The cardiologist advises that seven out of ten readings should be below 130 mmHg. Send a message to cardiology via MyChart regarding current blood pressure readings and management plan. Continue current medication regimen. Follow up with cardiology as instructed. Continue home monitoring.    OSA (obstructive sleep apnea) Assessment & Plan: Continue nightly CPAP use. Follow up with Pulmonology as scheduled.       Return in about 6 months (around 06/18/2024) for Follow up.   Leron Glance, NP-C [facility]

## 2023-12-19 NOTE — Assessment & Plan Note (Signed)
 He is scheduled for left total hip arthroplasty on December 8th due to osteoarthritis. Cleared by cardiology with instructions to hold aspirin for seven days prior. Medical clearance is provided from a primary care perspective. Fax preoperative clearance paperwork to the appropriate department. Instruct to hold aspirin for seven days before surgery.

## 2023-12-19 NOTE — Assessment & Plan Note (Signed)
 Continue nightly CPAP use. Follow up with Pulmonology as scheduled.

## 2023-12-19 NOTE — Assessment & Plan Note (Signed)
 Total hip replacement scheduled for Dec. 8th.

## 2023-12-19 NOTE — Telephone Encounter (Signed)
 Form faxed

## 2023-12-19 NOTE — Assessment & Plan Note (Addendum)
 His hypertension is not optimally controlled, with systolic readings consistently above 130 mmHg despite carvedilol  and valsartan . The cardiologist advises that seven out of ten readings should be below 130 mmHg. Send a message to cardiology via MyChart regarding current blood pressure readings and management plan. Continue current medication regimen. Follow up with cardiology as instructed. Continue home monitoring.

## 2023-12-20 ENCOUNTER — Encounter: Payer: Self-pay | Admitting: Cardiology

## 2023-12-23 ENCOUNTER — Other Ambulatory Visit: Payer: Self-pay

## 2023-12-23 DIAGNOSIS — I10 Essential (primary) hypertension: Secondary | ICD-10-CM

## 2023-12-23 MED ORDER — CARVEDILOL 12.5 MG PO TABS: 6.2500 mg | ORAL_TABLET | Freq: Two times a day (BID) | ORAL | 3 refills | Status: DC

## 2023-12-24 ENCOUNTER — Encounter: Payer: Self-pay | Admitting: Nurse Practitioner

## 2023-12-25 ENCOUNTER — Other Ambulatory Visit

## 2023-12-26 ENCOUNTER — Ambulatory Visit: Payer: Medicare PPO | Admitting: Radiation Oncology

## 2023-12-26 ENCOUNTER — Encounter: Payer: Self-pay | Admitting: Nurse Practitioner

## 2023-12-26 ENCOUNTER — Telehealth: Admitting: Nurse Practitioner

## 2023-12-26 VITALS — BP 139/73 | Ht 68.0 in | Wt 259.0 lb

## 2023-12-26 DIAGNOSIS — U071 COVID-19: Secondary | ICD-10-CM

## 2023-12-26 MED ORDER — PREDNISONE 20 MG PO TABS: 20.0000 mg | ORAL_TABLET | Freq: Every day | ORAL | 0 refills | Status: AC

## 2023-12-26 MED ORDER — NIRMATRELVIR/RITONAVIR (PAXLOVID) TABLET (RENAL DOSING): 2.0000 | ORAL_TABLET | Freq: Two times a day (BID) | ORAL | 0 refills | Status: AC

## 2023-12-26 NOTE — Patient Instructions (Signed)
Isolation Instructions: You are to isolate at home for 5 days from onset of your symptoms. If you must be around other household members who do not have symptoms, you need to make sure that both you and the family members are masking consistently with a high-quality mask.   After day 5 of isolation, if you have had no fever within 24 hours and you are feeling better, you can end isolation but need to mask for an additional 5 days.   After day 5 if you have a fever or are having significant symptoms, please isolate for full 10 days.   If you note any worsening of symptoms despite treatment, please seek an in-person evaluation ASAP. If you note any significant shortness of breath or any chest pain, please seek ER evaluation.

## 2023-12-26 NOTE — Progress Notes (Signed)
 Virtual Visit via Video Note  I connected with [name] on 12/26/23 at 4:56 PM by a video enabled telemedicine application and verified that I am speaking with the correct person using two identifiers.  Patient Location: Home Provider Location: Office/Clinic  I discussed the limitations, risks, security, and privacy concerns of performing an evaluation and management service by video and the availability of in person appointments. I also discussed with the patient that there may be a patient responsible charge related to this service. The patient expressed understanding and agreed to proceed.  Subjective: PCP: Gretel App, NP  Chief Complaint  Patient presents with   Acute Visit    Coughing, lots of mucous, very weak and a little SOB since last Friday  Tested Positive for Covid on Tuesday   HPI  [name] is an 83 year old male who in seen for positive COVID test.  He began experiencing respiratory symptoms last Friday, including a persistent cough and rhinorrhea. He feels extremely weak, preferring to sit in his chair. On Tuesday, a COVID test was positive. Symptoms have improved with reduced coughing, but he continues to produce yellow mucus. He notes this episode feels more severe than previous COVID infections.  He received a COVID vaccine in early fall and has been taking DayQuil and NyQuil. He experiences mild headaches almost daily, managed with rest or aspirin. No fever, chills, some  SOB with exertion, Oxygen saturation levels  95%.   He is requesting to get paxlovid .   Lab Results  Component Value Date   NA 138 09/10/2023   CL 104 09/10/2023   K 4.4 09/10/2023   CO2 29 09/10/2023   BUN 17 09/10/2023   CREATININE 1.47 09/10/2023   GFR 43.70 (L) 09/10/2023   CALCIUM 9.1 09/10/2023   ALBUMIN 4.3 09/10/2023   GLUCOSE 92 09/10/2023     ROS: Per HPI  Current Outpatient Medications:    acetaminophen (TYLENOL) 500 MG tablet, Take 500 mg by mouth every 6  (six) hours as needed., Disp: , Rfl:    aspirin 81 MG tablet, Take 81 mg by mouth daily., Disp: , Rfl:    carvedilol  (COREG ) 12.5 MG tablet, Take 0.5 tablets (6.25 mg total) by mouth 2 (two) times daily with a meal., Disp: 30 tablet, Rfl: 3   fluticasone (FLONASE) 50 MCG/ACT nasal spray, Place into both nostrils daily., Disp: , Rfl:    MITIGARE  0.6 MG CAPS, TAKE 0.6 MG BY MOUTH DAILY AS NEEDED (GOUT FLARE)., Disp: 90 capsule, Rfl: 1   Multiple Vitamins-Minerals (CENTRUM SILVER 50+MEN PO), Take by mouth., Disp: , Rfl:    nirmatrelvir /ritonavir , renal dosing, (PAXLOVID ) 10 x 150 MG & 10 x 100MG  TABS, Take 2 tablets by mouth 2 (two) times daily for 5 days. (Take nirmatrelvir  150 mg one tablet twice daily for 5 days and ritonavir  100 mg one tablet twice daily for 5 days) Patient GFR is 44 11/19/23, Disp: 20 tablet, Rfl: 0   pantoprazole  (PROTONIX ) 40 MG tablet, Take 1 tablet (40 mg total) by mouth 2 (two) times daily., Disp: 180 tablet, Rfl: 3   polyethylene glycol (MIRALAX / GLYCOLAX) 17 g packet, Take 17 g by mouth daily., Disp: , Rfl:    predniSONE  (DELTASONE ) 20 MG tablet, Take 1 tablet (20 mg total) by mouth daily with breakfast for 5 days., Disp: 5 tablet, Rfl: 0   tamsulosin  (FLOMAX ) 0.4 MG CAPS capsule, Take 1 capsule (0.4 mg total) by mouth daily., Disp: 90 capsule, Rfl: 2  valsartan  (DIOVAN ) 320 MG tablet, Take 1 tablet (320 mg total) by mouth daily., Disp: 90 tablet, Rfl: 3  Observations/Objective: Today's Vitals   12/26/23 1536  BP: 139/73  Weight: 259 lb (117.5 kg)  Height: 5' 8 (1.727 m)   Physical Exam Constitutional:      General: He is not in acute distress.    Appearance: Normal appearance. He is not ill-appearing.  Eyes:     Conjunctiva/sclera: Conjunctivae normal.  Pulmonary:     Effort: No respiratory distress.  Neurological:     Mental Status: He is alert.  Psychiatric:        Mood and Affect: Mood normal.        Behavior: Behavior normal.        Thought Content:  Thought content normal.        Judgment: Judgment normal.     Assessment and Plan: Positive self-administered antigen test for COVID-19 Assessment & Plan: Positive COVID test at home on 12/24/23. Symptoms improved but persistent yellow mucus and mild headache. Requesting paxlovid .    -Last egfr 44 on 11/19/23, -Prescribe Paxlovid  with renal dosing and prednisone .  -Recommended to not take mitigare  (Colchicine ) while on paxlovid . -Increase fluid intake. -Red flag discussed.  Patient was provided clear instructions to go to ER or urgent care if symptoms does not improve, red flag or new problem develops.  Patient verbalized understanding.     Other orders -     nirmatrelvir /ritonavir  (renal dosing); Take 2 tablets by mouth 2 (two) times daily for 5 days. (Take nirmatrelvir  150 mg one tablet twice daily for 5 days and ritonavir  100 mg one tablet twice daily for 5 days) Patient GFR is 44 11/19/23  Dispense: 20 tablet; Refill: 0 -     predniSONE ; Take 1 tablet (20 mg total) by mouth daily with breakfast for 5 days.  Dispense: 5 tablet; Refill: 0    Follow Up Instructions: Return inperson appointment if symptoms worsen or fail to improve.   I discussed the assessment and treatment plan with the patient. The patient was provided an opportunity to ask questions, and all were answered. The patient agreed with the plan and demonstrated an understanding of the instructions.   The patient was advised to call back or seek an in-person evaluation if the symptoms worsen or if the condition fails to improve as anticipated.  The above assessment and management plan was discussed with the patient. The patient verbalized understanding of and has agreed to the management plan.   Palmer Shorey, NP

## 2023-12-26 NOTE — Assessment & Plan Note (Addendum)
 Positive COVID test at home on 12/24/23. Symptoms improved but persistent yellow mucus and mild headache. Requesting paxlovid .    -Last egfr 44 on 11/19/23, -Prescribe Paxlovid  with renal dosing and prednisone .  -Recommended to not take mitigare  (Colchicine ) while on paxlovid . -Increase fluid intake. -Red flag discussed.  Patient was provided clear instructions to go to ER or urgent care if symptoms does not improve, red flag or new problem develops.  Patient verbalized understanding.

## 2023-12-27 ENCOUNTER — Other Ambulatory Visit

## 2024-01-01 DIAGNOSIS — G4733 Obstructive sleep apnea (adult) (pediatric): Secondary | ICD-10-CM | POA: Diagnosis not present

## 2024-01-08 ENCOUNTER — Other Ambulatory Visit

## 2024-01-08 DIAGNOSIS — M4302 Spondylolysis, cervical region: Secondary | ICD-10-CM | POA: Diagnosis not present

## 2024-01-08 DIAGNOSIS — M9901 Segmental and somatic dysfunction of cervical region: Secondary | ICD-10-CM | POA: Diagnosis not present

## 2024-01-08 DIAGNOSIS — N401 Enlarged prostate with lower urinary tract symptoms: Secondary | ICD-10-CM

## 2024-01-08 DIAGNOSIS — C61 Malignant neoplasm of prostate: Secondary | ICD-10-CM

## 2024-01-08 DIAGNOSIS — M531 Cervicobrachial syndrome: Secondary | ICD-10-CM | POA: Diagnosis not present

## 2024-01-08 DIAGNOSIS — M9904 Segmental and somatic dysfunction of sacral region: Secondary | ICD-10-CM | POA: Diagnosis not present

## 2024-01-08 DIAGNOSIS — M4306 Spondylolysis, lumbar region: Secondary | ICD-10-CM | POA: Diagnosis not present

## 2024-01-08 DIAGNOSIS — M9903 Segmental and somatic dysfunction of lumbar region: Secondary | ICD-10-CM | POA: Diagnosis not present

## 2024-01-08 DIAGNOSIS — N138 Other obstructive and reflux uropathy: Secondary | ICD-10-CM | POA: Diagnosis not present

## 2024-01-08 DIAGNOSIS — M9905 Segmental and somatic dysfunction of pelvic region: Secondary | ICD-10-CM | POA: Diagnosis not present

## 2024-01-08 DIAGNOSIS — M9907 Segmental and somatic dysfunction of upper extremity: Secondary | ICD-10-CM | POA: Diagnosis not present

## 2024-01-08 DIAGNOSIS — M9902 Segmental and somatic dysfunction of thoracic region: Secondary | ICD-10-CM | POA: Diagnosis not present

## 2024-01-09 LAB — PSA: Prostate Specific Ag, Serum: <0.1 ng/mL (ref 0.0–4.0)

## 2024-01-10 ENCOUNTER — Other Ambulatory Visit

## 2024-01-11 ENCOUNTER — Ambulatory Visit: Payer: Self-pay | Admitting: Physician Assistant

## 2024-01-20 ENCOUNTER — Ambulatory Visit
Admission: RE | Admit: 2024-01-20 | Discharge: 2024-01-20 | Disposition: A | Source: Ambulatory Visit | Attending: Radiation Oncology | Admitting: Radiation Oncology

## 2024-01-20 VITALS — BP 151/71 | HR 50 | Temp 98.0°F | Resp 16 | Ht 68.0 in | Wt 263.0 lb

## 2024-01-20 DIAGNOSIS — C61 Malignant neoplasm of prostate: Secondary | ICD-10-CM | POA: Insufficient documentation

## 2024-01-20 DIAGNOSIS — Z923 Personal history of irradiation: Secondary | ICD-10-CM | POA: Insufficient documentation

## 2024-01-20 DIAGNOSIS — Z191 Hormone sensitive malignancy status: Secondary | ICD-10-CM | POA: Diagnosis not present

## 2024-01-20 NOTE — Progress Notes (Signed)
 Radiation Oncology Follow up Note  Name: [name]   Date:   01/20/2024 MRN:  [id number] DOB: 1941-02-11    This 83 y.o. male presents to the clinic today for 2-year follow-up status post image guided IMRT radiation therapy for Gleason 8 adenocarcinoma the prostate.  REFERRING PROVIDER: Maribeth Camellia MATSU, MD  HPI: Patient is a 83 year old male now out over 2 years having completed image guided IMRT radiation therapy to his prostate plus ADT therapy for Gleason 8 adenocarcinoma.  He is seen today in routine follow-up he is doing well specifically denies any increased lower urinary tract symptoms diarrhea or fatigue he is getting up every 2 hours does take Flomax  although he states this is improved over the past.  His PSA is currently undetectable at less than 0.1.  Patient has discontinued Eligard .  COMPLICATIONS OF TREATMENT: none  FOLLOW UP COMPLIANCE: keeps appointments   PHYSICAL EXAM:  BP (!) 151/71   Pulse (!) 50   Temp 98 F (36.7 C)   Resp 16   Ht 5' 8 (1.727 m)   Wt 263 lb (119.3 kg)   BMI 39.99 kg/m  Well-developed well-nourished patient in NAD. HEENT reveals PERLA, EOMI, discs not visualized.  Oral cavity is clear. No oral mucosal lesions are identified. Neck is clear without evidence of cervical or supraclavicular adenopathy. Lungs are clear to A&P. Cardiac examination is essentially unremarkable with regular rate and rhythm without murmur rub or thrill. Abdomen is benign with no organomegaly or masses noted. Motor sensory and DTR levels are equal and symmetric in the upper and lower extremities. Cranial nerves II through XII are grossly intact. Proprioception is intact. No peripheral adenopathy or edema is identified. No motor or sensory levels are noted. Crude visual fields are within normal range.  RADIOLOGY RESULTS: No current films to review  PLAN: Present time patient is under excellent biochemical control of his prostate cancer and pleased with his overall  progress.  I will turn follow-up care over to urology.  Be happy to reevaluate the patient anytime in the future should that be indicated.  Patient is to call with any concerns.  I would like to take this opportunity to thank you for allowing me to participate in the care of your patient.SABRA Marcey Penton, MD

## 2024-01-27 NOTE — Patient Instructions (Addendum)
 SURGICAL WAITING ROOM VISITATION Patients having surgery or a procedure may have no more than 2 support people in the waiting area - these visitors may rotate in the visitor waiting room.   Due to an increase in RSV and influenza rates and associated hospitalizations, children ages 16 and under may not visit patients in [facility] hospitals. If the patient needs to stay at the hospital during part of their recovery, the visitor guidelines for inpatient rooms apply.  PRE-OP VISITATION  Pre-op nurse will coordinate an appropriate time for 1 support person to accompany the patient in pre-op.  This support person may not rotate.  This visitor will be contacted when the time is appropriate for the visitor to come back in the pre-op area.  Please refer to the [facility] website for the visitor guidelines for Inpatients (after your surgery is over and you are in a regular room).  You are not required to quarantine at this time prior to your surgery. However, you must do this: Hand Hygiene often Do NOT share personal items Notify your provider if you are in close contact with someone who has COVID or you develop fever 100.4 or greater, new onset of sneezing, cough, sore throat, shortness of breath or body aches.  If you test positive for Covid or have been in contact with anyone that has tested positive in the last 10 days please notify you surgeon.    Your procedure is scheduled on:  02/10/24  Report to [facility] Main Entrance: [place] entrance where the [employer] is available.   Report to admitting at: 11:10 AM  Call this number if you have any questions or problems the morning of surgery [phone]  FOLLOW ANY ADDITIONAL PRE OP INSTRUCTIONS YOU RECEIVED FROM YOUR SURGEON'S OFFICE!!!  Do not eat food after Midnight the night prior to your surgery/procedure.  After Midnight you may have the following liquids until: 10:40 AM DAY OF SURGERY  Clear Liquid Diet Water Black  Coffee (sugar ok, NO MILK/CREAM OR CREAMERS)  Tea (sugar ok, NO MILK/CREAM OR CREAMERS) regular and decaf                             Plain Jell-O  with no fruit (NO RED)                                           Fruit ices (not with fruit pulp, NO RED)                                     Popsicles (NO RED)                                                                  Juice: NO CITRUS JUICES: only apple, WHITE grape, WHITE cranberry Sports drinks like Gatorade or Powerade (NO RED)   The day of surgery:  Drink ONE (1) Pre-Surgery Clear Ensure at : 10:40 AM the morning of surgery. Drink in one sitting. Do not sip.  This drink was given  to you during your hospital pre-op appointment visit. Nothing else to drink after completing the Pre-Surgery Clear Ensure or G2 : No candy, chewing gum or throat lozenges.    Oral Hygiene is also important to reduce your risk of infection.        Remember - BRUSH YOUR TEETH THE MORNING OF SURGERY WITH YOUR REGULAR TOOTHPASTE  Do NOT smoke after Midnight the night before surgery.  STOP TAKING all Vitamins, Herbs and supplements 1 week before your surgery.   Take ONLY these medicines the morning of surgery with A SIP OF WATER: carvedilol ,tamsulosin ,pantoprazole .Tylenol,mitigare  as needed.   If You have been diagnosed with Sleep Apnea - Bring CPAP mask and tubing day of surgery. We will provide you with a CPAP machine on the day of your surgery.                   You may not have any metal on your body including hair pins, jewelry, and body piercing  Do not wear lotions, powders, perfumes / cologne, or deodorant  Men may shave face and neck.  Contacts, Hearing Aids, dentures or bridgework may not be worn into surgery. DENTURES WILL BE REMOVED PRIOR TO SURGERY PLEASE DO NOT APPLY Poly grip OR ADHESIVES!!!  You may bring a small overnight bag with you on the day of surgery, only pack items that are not valuable. [facility] IS NOT RESPONSIBLE   FOR  VALUABLES THAT ARE LOST OR STOLEN.   Patients discharged on the day of surgery will not be allowed to drive home.  Someone NEEDS to stay with you for the first 24 hours after anesthesia.  Do not bring your home medications to the hospital. The Pharmacy will dispense medications listed on your medication list to you during your admission in the Hospital.  Special Instructions: Bring a copy of your healthcare power of attorney and living will documents the day of surgery, if you wish to have them scanned into your [facility] [place] [facility] Records- EPIC  Please read over the following fact sheets you were given: IF YOU HAVE QUESTIONS ABOUT YOUR PRE-OP INSTRUCTIONS, PLEASE CALL [phone]  PATIENT SIGNATURE_________________________________  NURSE SIGNATURE__________________________________  ________________________________________________________________________  Pre-operative 4 CHG Bath Instructions  DYNA-Hex 4 Chlorhexidine Gluconate 4% Solution Antiseptic 4 fl. oz   You can play a key role in reducing the risk of infection after surgery. Your skin needs to be as free of germs as possible. You can reduce the number of germs on your skin by washing with CHG (chlorhexidine gluconate) soap before surgery. CHG is an antiseptic soap that kills germs and continues to kill germs even after washing.   DO NOT use if you have an allergy to chlorhexidine/CHG or antibacterial soaps. If your skin becomes reddened or irritated, stop using the CHG and notify one of our RNs at   Please shower with the CHG soap starting 4 days before surgery using the following schedule:     Please keep in mind the following:  DO NOT shave, including legs and underarms, starting the day of your first shower.   You may shave your face at any point before/day of surgery.  Place clean sheets on your bed the day you start using CHG soap. Use a clean washcloth (not used since being washed) for each shower. DO NOT sleep with  pets once you start using the CHG.  CHG Shower Instructions:  If you choose to wash your hair and private area, wash first with your normal shampoo/soap.  After you use shampoo/soap, rinse your hair and body thoroughly to remove shampoo/soap residue.  Turn the water OFF and apply about 3 tablespoons (45 ml) of CHG soap to a CLEAN washcloth.  Apply CHG soap ONLY FROM YOUR NECK DOWN TO YOUR TOES (washing for 3-5 minutes)  DO NOT use CHG soap on face, private areas, open wounds, or sores.  Pay special attention to the area where your surgery is being performed.  If you are having back surgery, having someone wash your back for you may be helpful. Wait 2 minutes after CHG soap is applied, then you may rinse off the CHG soap.  Pat dry with a clean towel  Put on clean clothes/pajamas   If you choose to wear lotion, please use ONLY the CHG-compatible lotions on the back of this paper.     Additional instructions for the day of surgery: DO NOT APPLY any lotions, deodorants, cologne, or perfumes.   Put on clean/comfortable clothes.  Brush your teeth.  Ask your nurse before applying any prescription medications to the skin.   CHG Compatible Lotions   Aveeno Moisturizing lotion  Cetaphil Moisturizing Cream  Cetaphil Moisturizing Lotion  Clairol Herbal Essence Moisturizing Lotion, Dry Skin  Clairol Herbal Essence Moisturizing Lotion, Extra Dry Skin  Clairol Herbal Essence Moisturizing Lotion, Normal Skin  Curel Age Defying Therapeutic Moisturizing Lotion with Alpha Hydroxy  Curel Extreme Care Body Lotion  Curel Soothing Hands Moisturizing Hand Lotion  Curel Therapeutic Moisturizing Cream, Fragrance-Free  Curel Therapeutic Moisturizing Lotion, Fragrance-Free  Curel Therapeutic Moisturizing Lotion, Original Formula  Eucerin Daily Replenishing Lotion  Eucerin Dry Skin Therapy Plus Alpha Hydroxy Crme  Eucerin Dry Skin Therapy Plus Alpha Hydroxy Lotion  Eucerin Original Crme  Eucerin Original  Lotion  Eucerin Plus Crme Eucerin Plus Lotion  Eucerin TriLipid Replenishing Lotion  Keri Anti-Bacterial Hand Lotion  Keri Deep Conditioning Original Lotion Dry Skin Formula Softly Scented  Keri Deep Conditioning Original Lotion, Fragrance Free Sensitive Skin Formula  Keri Lotion Fast Absorbing Fragrance Free Sensitive Skin Formula  Keri Lotion Fast Absorbing Softly Scented Dry Skin Formula  Keri Original Lotion  Keri Skin Renewal Lotion Keri Silky Smooth Lotion  Keri Silky Smooth Sensitive Skin Lotion  Nivea Body Creamy Conditioning Oil  Nivea Body Extra Enriched Lotion  Nivea Body Original Lotion  Nivea Body Sheer Moisturizing Lotion Nivea Crme  Nivea Skin Firming Lotion  NutraDerm 30 Skin Lotion  NutraDerm Skin Lotion  NutraDerm Therapeutic Skin Cream  NutraDerm Therapeutic Skin Lotion  ProShield Protective Hand Cream  Provon moisturizing lotion  Incentive Spirometer  An incentive spirometer is a tool that can help keep your lungs clear and active. This tool measures how well you are filling your lungs with each breath. Taking long deep breaths may help reverse or decrease the chance of developing breathing (pulmonary) problems (especially infection) following: A long period of time when you are unable to move or be active. BEFORE THE PROCEDURE  If the spirometer includes an indicator to show your best effort, your nurse or respiratory therapist will set it to a desired goal. If possible, sit up straight or lean slightly forward. Try not to slouch. Hold the incentive spirometer in an upright position. INSTRUCTIONS FOR USE  Sit on the edge of your bed if possible, or sit up as far as you can in bed or on a chair. Hold the incentive spirometer in an upright position. Breathe out normally. Place the mouthpiece in your mouth and seal your lips tightly around  it. Breathe in slowly and as deeply as possible, raising the piston or the ball toward the top of the column. Hold your  breath for 3-5 seconds or for as long as possible. Allow the piston or ball to fall to the bottom of the column. Remove the mouthpiece from your mouth and breathe out normally. Rest for a few seconds and repeat Steps 1 through 7 at least 10 times every 1-2 hours when you are awake. Take your time and take a few normal breaths between deep breaths. The spirometer may include an indicator to show your best effort. Use the indicator as a goal to work toward during each repetition. After each set of 10 deep breaths, practice coughing to be sure your lungs are clear. If you have an incision (the cut made at the time of surgery), support your incision when coughing by placing a pillow or rolled up towels firmly against it. Once you are able to get out of bed, walk around indoors and cough well. You may stop using the incentive spirometer when instructed by your caregiver.  RISKS AND COMPLICATIONS Take your time so you do not get dizzy or light-headed. If you are in pain, you may need to take or ask for pain medication before doing incentive spirometry. It is harder to take a deep breath if you are having pain. AFTER USE Rest and breathe slowly and easily. It can be helpful to keep track of a log of your progress. Your caregiver can provide you with a simple table to help with this. If you are using the spirometer at home, follow these instructions: SEEK MEDICAL CARE IF:  You are having difficultly using the spirometer. You have trouble using the spirometer as often as instructed. Your pain medication is not giving enough relief while using the spirometer. You develop fever of 100.5 F (38.1 C) or higher. SEEK IMMEDIATE MEDICAL CARE IF:  You cough up bloody sputum that had not been present before. You develop fever of 102 F (38.9 C) or greater. You develop worsening pain at or near the incision site. MAKE SURE YOU:  Understand these instructions. Will watch your condition. Will get help right  away if you are not doing well or get worse. Document Released: 07/02/2006 Document Revised: 05/14/2011 Document Reviewed: 09/02/2006 [facility] Patient Information 2014 [place], [place].   ________________________________________________________________________

## 2024-01-28 ENCOUNTER — Encounter (HOSPITAL_COMMUNITY)
Admission: RE | Admit: 2024-01-28 | Discharge: 2024-01-28 | Disposition: A | Discharge status: Home / Self Care | Source: Ambulatory Visit | Attending: Orthopedic Surgery | Admitting: Orthopedic Surgery

## 2024-01-28 ENCOUNTER — Other Ambulatory Visit: Payer: Self-pay

## 2024-01-28 ENCOUNTER — Encounter (HOSPITAL_COMMUNITY): Payer: Self-pay

## 2024-01-28 VITALS — BP 164/90 | HR 59 | Temp 98.3°F | Ht 68.0 in | Wt 260.0 lb

## 2024-01-28 DIAGNOSIS — I498 Other specified cardiac arrhythmias: Secondary | ICD-10-CM | POA: Diagnosis not present

## 2024-01-28 DIAGNOSIS — I10 Essential (primary) hypertension: Secondary | ICD-10-CM | POA: Diagnosis not present

## 2024-01-28 DIAGNOSIS — Z01818 Encounter for other preprocedural examination: Secondary | ICD-10-CM | POA: Insufficient documentation

## 2024-01-28 DIAGNOSIS — K219 Gastro-esophageal reflux disease without esophagitis: Secondary | ICD-10-CM | POA: Insufficient documentation

## 2024-01-28 DIAGNOSIS — Z923 Personal history of irradiation: Secondary | ICD-10-CM | POA: Diagnosis not present

## 2024-01-28 DIAGNOSIS — G4733 Obstructive sleep apnea (adult) (pediatric): Secondary | ICD-10-CM | POA: Diagnosis not present

## 2024-01-28 DIAGNOSIS — Z8546 Personal history of malignant neoplasm of prostate: Secondary | ICD-10-CM | POA: Diagnosis not present

## 2024-01-28 DIAGNOSIS — M1612 Unilateral primary osteoarthritis, left hip: Secondary | ICD-10-CM | POA: Insufficient documentation

## 2024-01-28 HISTORY — DX: Dyspnea, unspecified: R06.00

## 2024-01-28 LAB — CBC
HCT: 36.3 % — ABNORMAL LOW (ref 39.0–52.0)
Hemoglobin: 11.7 g/dL — ABNORMAL LOW (ref 13.0–17.0)
MCH: 31.2 pg (ref 26.0–34.0)
MCHC: 32.2 g/dL (ref 30.0–36.0)
MCV: 96.8 fL (ref 80.0–100.0)
Platelets: 200 K/uL (ref 150–400)
RBC: 3.75 MIL/uL — ABNORMAL LOW (ref 4.22–5.81)
RDW: 13.8 % (ref 11.5–15.5)
WBC: 4.8 K/uL (ref 4.0–10.5)
nRBC: 0 % (ref 0.0–0.2)

## 2024-01-28 LAB — BASIC METABOLIC PANEL WITH GFR
Anion gap: 10 (ref 5–15)
BUN: 17 mg/dL (ref 8–23)
CO2: 26 mmol/L (ref 22–32)
Calcium: 9.2 mg/dL (ref 8.9–10.3)
Chloride: 105 mmol/L (ref 98–111)
Creatinine, Ser: 1.36 mg/dL — ABNORMAL HIGH (ref 0.61–1.24)
GFR, Estimated: 51 mL/min — ABNORMAL LOW (ref >60)
Glucose, Bld: 113 mg/dL — ABNORMAL HIGH (ref 70–99)
Potassium: 4.3 mmol/L (ref 3.5–5.1)
Sodium: 140 mmol/L (ref 135–145)

## 2024-01-28 LAB — TYPE AND SCREEN
ABO/RH(D): O POS
Antibody Screen: NEGATIVE

## 2024-01-28 LAB — SURGICAL PCR SCREEN
MRSA, PCR: NEGATIVE
Staphylococcus aureus: NEGATIVE

## 2024-01-28 NOTE — Progress Notes (Signed)
 For Anesthesia: PCP - Gretel App, NP  Cardiologist - Darliss Rogue, MD  Clearance: Lamarr Satterfield, NP :  Bowel Prep reminder:  Chest x-ray -  EKG - 01/28/24 Stress Test -  ECHO - 11/28/22 Cardiac Cath -  Pacemaker/ICD device last checked: Pacemaker orders received: Device Rep notified:  Spinal Cord Stimulator:N/A  Sleep Study - Yes CPAP - Yes  Fasting Blood Sugar - N/A Checks Blood Sugar _____ times a day Date and result of last Hgb A1c-  Last dose of GLP1 agonist- N/A GLP1 instructions: Hold 7 days prior to schedule (Hold 24 hours-daily)   Last dose of SGLT-2 inhibitors- N/A SGLT-2 instructions: Hold 72 hours prior to surgery  Blood Thinner Instructions:N/A Last Dose: Time last taken:  Aspirin Instructions: On hold Last Dose: Time last taken:  Activity level:    Unable to go up a flight of stairs without shortness of breath    Anesthesia review: Hx: HTN,SOB(on exertion),OSA(CPAP).  Patient denies shortness of breath, fever, cough and chest pain at PAT appointment   Patient verbalized understanding of instructions that were reviewed over the telephone.

## 2024-01-29 DIAGNOSIS — G4733 Obstructive sleep apnea (adult) (pediatric): Secondary | ICD-10-CM | POA: Diagnosis not present

## 2024-01-29 NOTE — Progress Notes (Signed)
 Anesthesia Chart Review   Case: [id number] Date/Time: 02/10/24 1325   Procedure: ARTHROPLASTY, HIP, TOTAL, ANTERIOR APPROACH (Left: Hip)   Anesthesia type: Choice   Pre-op diagnosis: Left hip osteoarthritis   Location: [facility] ROOM 09 / [facility]   Surgeons: Melodi Lerner, MD       DISCUSSION:83 y.o. never smoker with h/o HTN, OSA on CPAP, GERD, prostate cancer s/p radiation treatment, CKD Stage III, left hip OA scheduled for above procedure 02/10/2024 with Dr. Lerner Melodi.   Pt cleared for upcoming procedure by PCP.   Per cardiology preoperative evaluation 11/27/23, Chart reviewed as part of pre-operative protocol coverage. Given past medical history and time since last visit, based on ACC/AHA guidelines, HAZEN BRUMETT is at acceptable risk for the planned procedure without further cardiovascular testing.    Per Dr.Agbor-Etang 11/27/2023 Preprocedural exam, left hip arthroplasty being planned. Okay for procedure from a cardiac perspective.    Per office protocol, if patient is without any new symptoms or concerns at the time of their virtual visit, he may hold ASA for 7 days prior to procedure. Please resume ASA as soon as possible postprocedure, at the discretion of the surgeon.     VS: BP (!) 164/90   Pulse (!) 59   Temp 36.8 C (Oral)   Ht 5' 8 (1.727 m)   Wt 117.9 kg   SpO2 95%   BMI 39.53 kg/m   PROVIDERS: Gretel App, NP is PCP   Cardiologist - Darliss Rogue, MD  LABS: Labs reviewed: Acceptable for surgery. (all labs ordered are listed, but only abnormal results are displayed)  Labs Reviewed  BASIC METABOLIC PANEL WITH GFR - Abnormal; Notable for the following components:      Result Value   Glucose, Bld 113 (*)    Creatinine, Ser 1.36 (*)    GFR, Estimated 51 (*)    All other components within normal limits  CBC - Abnormal; Notable for the following components:   RBC 3.75 (*)    Hemoglobin 11.7 (*)    HCT 36.3 (*)    All other components within normal  limits  SURGICAL PCR SCREEN  TYPE AND SCREEN     IMAGES:   EKG:   CV: Echo 11/28/2022  1. Left ventricular ejection fraction, by estimation, is 55 to 60%. The  left ventricle has normal function. The left ventricle has no regional  wall motion abnormalities. Left ventricular diastolic parameters are  consistent with Grade I diastolic  dysfunction (impaired relaxation). The average left ventricular global  longitudinal strain is -19.2 %.   2. Right ventricular systolic function is normal. The right ventricular  size is normal. Tricuspid regurgitation signal is inadequate for assessing  PA pressure.   3. The mitral valve is normal in structure. No evidence of mitral valve  regurgitation. No evidence of mitral stenosis.   4. The aortic valve is tricuspid. Aortic valve regurgitation is not  visualized. No aortic stenosis is present.   5. There is mild dilatation of the ascending aorta, measuring 40 mm.   6. The inferior vena cava is normal in size with greater than 50%  respiratory variability, suggesting right atrial pressure of 3 mmHg.  Past Medical History:  Diagnosis Date   Allergy    Anxiety    Arthritis    Cataract    beginning stage bilateral   Diverticulosis    Dyspnea    Elevated PSA    Esophageal stricture    Esophagitis    GERD (gastroesophageal  reflux disease)    Gout    History of kidney stones    Hyperlipidemia    Hypertension    Inguinal hernia    Internal hemorrhoids    Pancreatitis    Prostate cancer (HCC)    Tubular adenoma of colon     Past Surgical History:  Procedure Laterality Date   BACK SURGERY  1980's   L4/5   BALLOON DILATION N/A 11/17/2018   Procedure: URETHRAL BALLOON DILATION;  Surgeon: Penne Knee, MD;  Location: [facility];  Service: Urology;  Laterality: N/A;   CARPAL TUNNEL RELEASE Right 08/15/2016   Procedure: CARPAL TUNNEL RELEASE ENDOSCOPIC;  Surgeon: Edie Norleen PARAS, MD;  Location: [facility];  Service: Orthopedics;   Laterality: Right;   CATARACT EXTRACTION W/PHACO Right 03/14/2021   Procedure: CATARACT EXTRACTION PHACO AND INTRAOCULAR LENS PLACEMENT (IOC) RIGHT 6.84 00:52.3;  Surgeon: Jaye Fallow, MD;  Location: [facility];  Service: Ophthalmology;  Laterality: Right;   CATARACT EXTRACTION W/PHACO Left 03/28/2021   Procedure: CATARACT EXTRACTION PHACO AND INTRAOCULAR LENS PLACEMENT (IOC) LEFT;  Surgeon: Jaye Fallow, MD;  Location: [facility];  Service: Ophthalmology;  Laterality: Left;   COLONOSCOPY     CYSTOSCOPY N/A 11/17/2018   Procedure: CYSTOSCOPY;  Surgeon: Penne Knee, MD;  Location: [facility];  Service: Urology;  Laterality: N/A;   CYSTOSCOPY WITH LITHOLAPAXY N/A 10/09/2017   Procedure: CYSTOSCOPY WITH LITHOLAPAXY;  Surgeon: Penne Knee, MD;  Location: [facility];  Service: Urology;  Laterality: N/A;   ESOPHAGOGASTRODUODENOSCOPY  11-21-06   Esophagitis, Stricture/dilated    HOLEP-LASER ENUCLEATION OF THE PROSTATE WITH MORCELLATION N/A 10/09/2017   Procedure: HOLEP-LASER ENUCLEATION OF THE PROSTATE WITH MORCELLATION;  Surgeon: Penne Knee, MD;  Location: [facility];  Service: Urology;  Laterality: N/A;   LYMPH NODE BIOPSY  1957   benign  lymph node removed   Right arm fracture     TONSILLECTOMY     TOTAL KNEE ARTHROPLASTY  09-02-02   Bilateral   UPPER GASTROINTESTINAL ENDOSCOPY      MEDICATIONS:  acetaminophen (TYLENOL) 500 MG tablet   aspirin 81 MG tablet   carvedilol  (COREG ) 12.5 MG tablet   clindamycin  (CLEOCIN ) 150 MG capsule   fluticasone (FLONASE) 50 MCG/ACT nasal spray   MITIGARE  0.6 MG CAPS   Multiple Vitamins-Minerals (CENTRUM SILVER 50+MEN PO)   pantoprazole  (PROTONIX ) 40 MG tablet   polyethylene glycol (MIRALAX / GLYCOLAX) 17 g packet   tamsulosin  (FLOMAX ) 0.4 MG CAPS capsule   valsartan  (DIOVAN ) 320 MG tablet   No current facility-administered medications for this encounter.    Harlene Hoots Ward, PA-C [facility] Pre-Surgical Testing [phone]

## 2024-01-29 NOTE — Anesthesia Preprocedure Evaluation (Addendum)
 Anesthesia Evaluation  Patient identified by MRN, date of birth, ID band Patient awake    Reviewed: Allergy & Precautions, NPO status , Patient's Chart, lab work & pertinent test results, reviewed documented beta blocker date and time   History of Anesthesia Complications Negative for: history of anesthetic complications  Airway Mallampati: IV  TM Distance: >3 FB Neck ROM: Limited    Dental  (+) Teeth Intact, Dental Advisory Given   Pulmonary sleep apnea (HST 12/07/22 AHI 40.2, SpO2 low 74%) and Continuous Positive Airway Pressure Ventilation    breath sounds clear to auscultation       Cardiovascular hypertension, Pt. on medications and Pt. on home beta blockers  Rhythm:Regular Rate:Normal  TTE (2024):  1. Left ventricular ejection fraction, by estimation, is 55 to 60%. The  left ventricle has normal function. The left ventricle has no regional  wall motion abnormalities. Left ventricular diastolic parameters are  consistent with Grade I diastolic  dysfunction (impaired relaxation). The average left ventricular global  longitudinal strain is -19.2 %.   2. Right ventricular systolic function is normal. The right ventricular  size is normal. Tricuspid regurgitation signal is inadequate for assessing  PA pressure.   3. The mitral valve is normal in structure. No evidence of mitral valve  regurgitation. No evidence of mitral stenosis.   4. The aortic valve is tricuspid. Aortic valve regurgitation is not  visualized. No aortic stenosis is present.   5. There is mild dilatation of the ascending aorta, measuring 40 mm.   6. The inferior vena cava is normal in size with greater than 50%  respiratory variability, suggesting right atrial pressure of 3 mmHg.     Neuro/Psych   Anxiety        GI/Hepatic Neg liver ROS,GERD  Medicated and Controlled,,Hx of Pancreatitis; Hx of Esophageal Stricture with associated Esophagitis    Endo/Other   neg diabetes    Renal/GU Renal InsufficiencyRenal disease (Baseline Cr of ~1.3)   Hx of Prostate Cancer s/p XRT    Musculoskeletal  (+) Arthritis ,    Abdominal   Peds  Hematology  (+) Blood dyscrasia, anemia Hgb 11.7, Plts 200K (01/28/24)   Anesthesia Other Findings Gout  Reproductive/Obstetrics                              Anesthesia Physical Anesthesia Plan  ASA: 2  Anesthesia Plan: MAC and Spinal   Post-op Pain Management:    Induction: Intravenous  PONV Risk Score and Plan: 1 and Propofol  infusion and Treatment may vary due to age or medical condition  Airway Management Planned: Simple Face Mask and Natural Airway  Additional Equipment: None  Intra-op Plan:   Post-operative Plan:   Informed Consent:      Dental advisory given  Plan Discussed with: CRNA  Anesthesia Plan Comments: (See PAT note 01/28/24)         Anesthesia Quick Evaluation

## 2024-02-01 DIAGNOSIS — G4733 Obstructive sleep apnea (adult) (pediatric): Secondary | ICD-10-CM | POA: Diagnosis not present

## 2024-02-03 NOTE — H&P (Signed)
 TOTAL HIP ADMISSION H&P  Patient is admitted for left total hip arthroplasty.  Subjective:  Chief Complaint: Left hip pain  HPI: [name], 83 y.o. male, has a history of pain and functional disability in the left hip due to arthritis and patient has failed non-surgical conservative treatments for greater than 12 weeks to include use of assistive devices and activity modification. Onset of symptoms was gradual, starting several years ago with rapidlly worsening course since that time. The patient noted no past surgery on the left hip. Patient currently rates pain in the left hip at 9 out of 10 with activity. Patient has night pain, worsening of pain with activity and weight bearing, pain that interfers with activities of daily living, and pain with passive range of motion. Patient has evidence of severe end-stage arthritis of the left hip, characterized by bone-on-bone articulation, subchondral cysts, and marginal osteophytes by imaging studies. This condition presents safety issues increasing the risk of falls. There is no current active infection.  Patient Active Problem List   Diagnosis Date Noted   Positive self-administered antigen test for COVID-19 12/26/2023   Preoperative clearance 12/19/2023   Osteoarthritis of left hip 12/19/2023   Cirrhosis of liver without ascites (HCC) 06/18/2023   Scalp laceration 02/08/2023   OSA (obstructive sleep apnea) 01/17/2023   Dyspnea on exertion 12/10/2022   Snoring 11/27/2022   Anemia 09/10/2022   Left hip pain 06/11/2022   Irregular heartbeat 12/18/2021   Diarrhea 08/15/2021   Personal history of prostate cancer 06/15/2021   Recurrent microscopic hematuria 06/15/2021   Simple renal cyst 06/15/2021   Stage 3b chronic kidney disease (HCC) 06/15/2021   Unspecified urethral stricture, male, unspecified site 06/15/2021   Bilateral hip pain 06/14/2021   History of COVID-19 02/17/2021   Basal cell carcinoma 02/17/2021   Bilateral lower extremity  edema 02/17/2021   Benign prostatic hyperplasia 09/15/2020   Proteinuria, unspecified 09/15/2020   Fatigue 08/16/2020   Allergic rhinitis 02/11/2019   Gross hematuria 08/06/2017   Bladder calculi 08/06/2017   Rotator cuff impingement syndrome, left 07/18/2016   Frequent urination 04/26/2016   GERD (gastroesophageal reflux disease) 01/09/2016   Obesity 01/09/2016   Renal stone 07/16/2014   Elevated prostate specific antigen (PSA) 07/20/2013   Gout 06/23/2012   Prostate cancer (HCC) 07/06/2010   Hyperlipidemia 08/19/2006   Essential hypertension 08/19/2006    Past Medical History:  Diagnosis Date   Allergy    Anxiety    Arthritis    Cataract    beginning stage bilateral   Diverticulosis    Dyspnea    Elevated PSA    Esophageal stricture    Esophagitis    GERD (gastroesophageal reflux disease)    Gout    History of kidney stones    Hyperlipidemia    Hypertension    Inguinal hernia    Internal hemorrhoids    Pancreatitis    Prostate cancer (HCC)    Tubular adenoma of colon     Past Surgical History:  Procedure Laterality Date   BACK SURGERY  1980's   L4/5   BALLOON DILATION N/A 11/17/2018   Procedure: URETHRAL BALLOON DILATION;  Surgeon: Penne Knee, MD;  Location: [facility];  Service: Urology;  Laterality: N/A;   CARPAL TUNNEL RELEASE Right 08/15/2016   Procedure: CARPAL TUNNEL RELEASE ENDOSCOPIC;  Surgeon: Edie Norleen PARAS, MD;  Location: [facility];  Service: Orthopedics;  Laterality: Right;   CATARACT EXTRACTION W/PHACO Right 03/14/2021   Procedure: CATARACT EXTRACTION PHACO AND INTRAOCULAR  LENS PLACEMENT (IOC) RIGHT 6.84 00:52.3;  Surgeon: Jaye Fallow, MD;  Location: [facility];  Service: Ophthalmology;  Laterality: Right;   CATARACT EXTRACTION W/PHACO Left 03/28/2021   Procedure: CATARACT EXTRACTION PHACO AND INTRAOCULAR LENS PLACEMENT (IOC) LEFT;  Surgeon: Jaye Fallow, MD;  Location: [facility];  Service: Ophthalmology;   Laterality: Left;   COLONOSCOPY     CYSTOSCOPY N/A 11/17/2018   Procedure: CYSTOSCOPY;  Surgeon: Penne Knee, MD;  Location: [facility];  Service: Urology;  Laterality: N/A;   CYSTOSCOPY WITH LITHOLAPAXY N/A 10/09/2017   Procedure: CYSTOSCOPY WITH LITHOLAPAXY;  Surgeon: Penne Knee, MD;  Location: [facility];  Service: Urology;  Laterality: N/A;   ESOPHAGOGASTRODUODENOSCOPY  11-21-06   Esophagitis, Stricture/dilated    HOLEP-LASER ENUCLEATION OF THE PROSTATE WITH MORCELLATION N/A 10/09/2017   Procedure: HOLEP-LASER ENUCLEATION OF THE PROSTATE WITH MORCELLATION;  Surgeon: Penne Knee, MD;  Location: [facility];  Service: Urology;  Laterality: N/A;   LYMPH NODE BIOPSY  1957   benign  lymph node removed   Right arm fracture     TONSILLECTOMY     TOTAL KNEE ARTHROPLASTY  09-02-02   Bilateral   UPPER GASTROINTESTINAL ENDOSCOPY      Prior to Admission medications   Medication Sig Start Date End Date Taking? Authorizing Provider  acetaminophen (TYLENOL) 500 MG tablet Take 500 mg by mouth every 6 (six) hours as needed for mild pain (pain score 1-3), moderate pain (pain score 4-6) or headache.   Yes [provider]  aspirin 81 MG tablet Take 81 mg by mouth daily.   Yes [provider]  carvedilol  (COREG ) 12.5 MG tablet Take 0.5 tablets (6.25 mg total) by mouth 2 (two) times daily with a meal. 12/23/23  Yes Agbor-Etang, Redell, MD  clindamycin  (CLEOCIN ) 150 MG capsule Take 600 mg by mouth once.   Yes [provider]  fluticasone (FLONASE) 50 MCG/ACT nasal spray Place 2 sprays into both nostrils in the morning.   Yes [provider]  MITIGARE  0.6 MG CAPS TAKE 0.6 MG BY MOUTH DAILY AS NEEDED (GOUT FLARE). 04/12/20  Yes Maribeth Camellia MATSU, MD  Multiple Vitamins-Minerals (CENTRUM SILVER 50+MEN PO) Take 1 tablet by mouth daily.   Yes [provider]  pantoprazole  (PROTONIX ) 40 MG tablet Take 1 tablet (40 mg total) by mouth 2 (two) times daily. 08/19/23  Yes  Gretel App, NP  polyethylene glycol (MIRALAX / GLYCOLAX) 17 g packet Take 17 g by mouth daily.   Yes [provider]  tamsulosin  (FLOMAX ) 0.4 MG CAPS capsule Take 1 capsule (0.4 mg total) by mouth daily. 12/04/23  Yes Vaillancourt, Samantha, PA-C  valsartan  (DIOVAN ) 320 MG tablet Take 1 tablet (320 mg total) by mouth daily. 08/19/23  Yes Gretel App, NP    Allergies  Allergen Reactions   Penicillins Rash    Did it involve swelling of the face/tongue/throat, SOB, or low BP? No Did it involve sudden or severe rash/hives, skin peeling, or any reaction on the inside of your mouth or nose? No Did you need to seek medical attention at a hospital or doctor's office? No When did it last happen?      60 Years If all above answers are NO, may proceed with cephalosporin use.     Social History   Socioeconomic History   Marital status: Married    Spouse name: Not on file   Number of children: 1   Years of education: Not on file   Highest education level: Bachelor's degree (  e.g., BA, AB, BS)  Occupational History   Occupation: Retired Designer, Jewellery: [employer]    Comment: sales floors   Tobacco Use   Smoking status: Never    Passive exposure: Never   Smokeless tobacco: Never  Vaping Use   Vaping status: Never Used  Substance and Sexual Activity   Alcohol use: No   Drug use: No   Sexual activity: Yes  Other Topics Concern   Not on file  Social History Narrative   Lives in [place] with wife. From [place], [place]. 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 Daughter, Allean, lives in [place]. No pets.      Exercise: regular   Diet: increased sweets   Social Drivers of Corporate Investment Banker Strain: Low Risk  (12/16/2023)   Overall Financial Resource Strain (CARDIA)    Difficulty of Paying Living Expenses: Not hard at all  Food Insecurity: No Food Insecurity (12/16/2023)   Hunger Vital Sign    Worried About Running Out of Food in the Last Year:  Never true    Ran Out of Food in the Last Year: Never true  Transportation Needs: No Transportation Needs (12/16/2023)   PRAPARE - Administrator, Civil Service (Medical): No    Lack of Transportation (Non-Medical): No  Physical Activity: Insufficiently Active (12/16/2023)   Exercise Vital Sign    Days of Exercise per Week: 1 day    Minutes of Exercise per Session: 10 min  Stress: No Stress Concern Present (12/16/2023)   [employer] of Occupational Health - Occupational Stress Questionnaire    Feeling of Stress: Not at all  Social Connections: Socially Integrated (12/16/2023)   Social Connection and Isolation Panel    Frequency of Communication with Friends and Family: More than three times a week    Frequency of Social Gatherings with Friends and Family: More than three times a week    Attends Religious Services: More than 4 times per year    Active Member of [employer] or Organizations: Yes    Attends Engineer, Structural: More than 4 times per year    Marital Status: Married  Catering Manager Violence: Not At Risk (10/30/2023)   Humiliation, Afraid, Rape, and Kick questionnaire    Fear of Current or Ex-Partner: No    Emotionally Abused: No    Physically Abused: No    Sexually Abused: No    Tobacco Use: Low Risk  (01/28/2024)   Patient History    Smoking Tobacco Use: Never    Smokeless Tobacco Use: Never    Passive Exposure: Never   Social History   Substance and Sexual Activity  Alcohol Use No    Family History  Problem Relation Age of Onset   Ulcers Father    Diverticulitis Father    Arthritis Father    Hypertension Brother    Obesity Brother    Cancer Brother        throat   Arthritis Mother    Depression Mother        Anxiety   Colon cancer Neg Hx    Colon polyps Neg Hx    Esophageal cancer Neg Hx    Rectal cancer Neg Hx    Stomach cancer Neg Hx    Kidney disease Neg Hx    Kidney cancer Neg Hx    Prostate cancer Neg Hx      Review of Systems  Constitutional:  Negative for chills and fever.  HENT:  Negative for congestion, sore throat and tinnitus.   Eyes:  Negative for double vision, photophobia and pain.  Respiratory:  Negative for cough, shortness of breath and wheezing.   Cardiovascular:  Negative for chest pain, palpitations and orthopnea.  Gastrointestinal:  Negative for heartburn, nausea and vomiting.  Genitourinary:  Negative for dysuria, frequency and urgency.  Musculoskeletal:  Positive for joint pain.  Neurological:  Negative for dizziness, weakness and headaches.     Objective:  Physical Exam: Well nourished and well developed.  General: Alert and oriented x3, cooperative and pleasant, no acute distress.  Head: normocephalic, atraumatic, neck supple.  Eyes: EOMI.  Musculoskeletal:  Left hip: Flexion to 100 degrees, no internal rotation, approximately 10 degrees of external rotation, and 30 degrees of abduction.  - Gait: Significant antalgic gait pattern with a cane.  Calves soft and nontender. Motor function intact in LE. Strength 5/5 LE bilaterally. Neuro: Distal pulses 2+. Sensation to light touch intact in LE.   Imaging Review Plain radiographs demonstrate severe degenerative joint disease of the left hip. The bone quality appears to be adequate for age and reported activity level.  Assessment/Plan:  End stage arthritis, left hip  The patient history, physical examination, clinical judgement of the provider and imaging studies are consistent with end stage degenerative joint disease of the left hip and total hip arthroplasty is deemed medically necessary. The treatment options including medical management, injection therapy, arthroscopy and arthroplasty were discussed at length. The risks and benefits of total hip arthroplasty were presented and reviewed. The risks due to aseptic loosening, infection, stiffness, dislocation/subluxation, thromboembolic complications and other  imponderables were discussed. The patient acknowledged the explanation, agreed to proceed with the plan and consent was signed. Patient is being admitted for inpatient treatment for surgery, pain control, PT, OT, prophylactic antibiotics, VTE prophylaxis, progressive ambulation and ADLs and discharge planning.The patient is planning to be discharged home.   Patient's anticipated LOS is less than 2 midnights, meeting these requirements: - Lives within 1 hour of care - Has a competent adult at home to recover with post-op recover - NO history of  - Chronic pain requiring opioids  - Diabetes  - Heart failure  - Stroke  - DVT/VTE  - Cardiac arrhythmia  - Respiratory Failure/COPD  - Renal failure  - Anemia  - Advanced Liver disease  Therapy Plans: [facility] Disposition: Home with wife Planned DVT Prophylaxis: Xarelto 10 mg QD DME Needed: None PCP: Leron Glance, NP (clearance received) Cardiologist: Redell Cave, MD (clearance received) TXA: IV Allergies: PCN (rash - childhood rxn) Anesthesia Concerns: None BMI: 39.4 Last HgbA1c: Not diabetic Pain Regimen: Hydrocodone, tramadol  [facility]: [facility]  Other: - Stopping aspirin 5 days before  - Patient was instructed on what medications to stop prior to surgery. - Follow-up visit in 2 weeks with Dr. Melodi - Begin physical therapy following surgery - Pre-operative lab work as pre-surgical testing - Prescriptions will be provided in hospital at time of discharge  Roxie Mess, PA-C Orthopedic Surgery [facility] [place]

## 2024-02-10 ENCOUNTER — Encounter (HOSPITAL_COMMUNITY): Admission: RE | Disposition: A | Payer: Self-pay | Source: Ambulatory Visit | Attending: Orthopedic Surgery

## 2024-02-10 ENCOUNTER — Observation Stay (HOSPITAL_COMMUNITY)
Admission: RE | Admit: 2024-02-10 | Discharge: 2024-02-11 | Disposition: A | Source: Ambulatory Visit | Attending: Orthopedic Surgery | Admitting: Orthopedic Surgery

## 2024-02-10 ENCOUNTER — Ambulatory Visit (HOSPITAL_BASED_OUTPATIENT_CLINIC_OR_DEPARTMENT_OTHER): Admitting: Certified Registered Nurse Anesthetist

## 2024-02-10 ENCOUNTER — Ambulatory Visit (HOSPITAL_COMMUNITY): Payer: Self-pay | Admitting: Medical

## 2024-02-10 ENCOUNTER — Other Ambulatory Visit: Payer: Self-pay

## 2024-02-10 ENCOUNTER — Encounter (HOSPITAL_COMMUNITY): Payer: Self-pay | Admitting: Orthopedic Surgery

## 2024-02-10 ENCOUNTER — Observation Stay (HOSPITAL_COMMUNITY)

## 2024-02-10 ENCOUNTER — Ambulatory Visit (HOSPITAL_COMMUNITY)

## 2024-02-10 DIAGNOSIS — M1612 Unilateral primary osteoarthritis, left hip: Principal | ICD-10-CM | POA: Diagnosis present

## 2024-02-10 DIAGNOSIS — Z471 Aftercare following joint replacement surgery: Secondary | ICD-10-CM | POA: Diagnosis not present

## 2024-02-10 DIAGNOSIS — M169 Osteoarthritis of hip, unspecified: Principal | ICD-10-CM | POA: Diagnosis present

## 2024-02-10 DIAGNOSIS — Z96642 Presence of left artificial hip joint: Secondary | ICD-10-CM | POA: Diagnosis not present

## 2024-02-10 HISTORY — PX: TOTAL HIP ARTHROPLASTY: SHX124

## 2024-02-10 LAB — ABO/RH: ABO/RH(D): O POS

## 2024-02-10 SURGERY — ARTHROPLASTY, HIP, TOTAL, ANTERIOR APPROACH
Anesthesia: Monitor Anesthesia Care | Site: Hip | Laterality: Left

## 2024-02-10 MED ORDER — METOCLOPRAMIDE HCL 5 MG PO TABS: 5.0000 mg | ORAL_TABLET | Freq: Three times a day (TID) | ORAL | Status: DC | PRN

## 2024-02-10 MED ORDER — METHOCARBAMOL 500 MG PO TABS
500.0000 mg | ORAL_TABLET | Freq: Four times a day (QID) | ORAL | Status: DC | PRN
  Administered 2024-02-10 – 2024-02-11 (×3): 500 mg via ORAL
  Filled 2024-02-10 (×3): qty 1

## 2024-02-10 MED ORDER — PHENOL 1.4 % MT LIQD: 1.0000 | OROMUCOSAL | Status: DC | PRN

## 2024-02-10 MED ORDER — CARVEDILOL 6.25 MG PO TABS
6.2500 mg | ORAL_TABLET | Freq: Two times a day (BID) | ORAL | Status: DC
  Administered 2024-02-10 – 2024-02-11 (×2): 6.25 mg via ORAL
  Filled 2024-02-10 (×2): qty 1

## 2024-02-10 MED ORDER — ORAL CARE MOUTH RINSE: 15.0000 mL | OROMUCOSAL | Status: DC | PRN

## 2024-02-10 MED ORDER — IRBESARTAN 150 MG PO TABS
300.0000 mg | ORAL_TABLET | Freq: Every day | ORAL | Status: DC
  Administered 2024-02-11: 300 mg via ORAL
  Filled 2024-02-10: qty 2

## 2024-02-10 MED ORDER — MAGNESIUM CITRATE PO SOLN: 1.0000 | Freq: Once | ORAL | Status: DC | PRN

## 2024-02-10 MED ORDER — PROPOFOL 500 MG/50ML IV EMUL
INTRAVENOUS | Status: DC | PRN
  Administered 2024-02-10: 50 ug/kg/min via INTRAVENOUS

## 2024-02-10 MED ORDER — DEXAMETHASONE SOD PHOSPHATE PF 10 MG/ML IJ SOLN
8.0000 mg | Freq: Once | INTRAMUSCULAR | Status: AC
  Administered 2024-02-10: 5 mg via INTRAVENOUS

## 2024-02-10 MED ORDER — ACETAMINOPHEN 10 MG/ML IV SOLN
1000.0000 mg | Freq: Four times a day (QID) | INTRAVENOUS | Status: DC
  Administered 2024-02-10: 1000 mg via INTRAVENOUS
  Filled 2024-02-10: qty 100

## 2024-02-10 MED ORDER — OXYCODONE HCL 5 MG PO TABS: 5.0000 mg | ORAL_TABLET | Freq: Once | ORAL | Status: DC | PRN

## 2024-02-10 MED ORDER — 0.9 % SODIUM CHLORIDE (POUR BTL) OPTIME
TOPICAL | Status: DC | PRN
  Administered 2024-02-10: 1000 mL

## 2024-02-10 MED ORDER — METHOCARBAMOL 1000 MG/10ML IJ SOLN: 500.0000 mg | Freq: Four times a day (QID) | INTRAMUSCULAR | Status: DC | PRN

## 2024-02-10 MED ORDER — FENTANYL CITRATE (PF) 250 MCG/5ML IJ SOLN
INTRAMUSCULAR | Status: DC | PRN
  Administered 2024-02-10: 50 ug via INTRAVENOUS

## 2024-02-10 MED ORDER — RIVAROXABAN 10 MG PO TABS
10.0000 mg | ORAL_TABLET | Freq: Every day | ORAL | Status: DC
  Administered 2024-02-11: 10 mg via ORAL
  Filled 2024-02-10: qty 1

## 2024-02-10 MED ORDER — ACETAMINOPHEN 10 MG/ML IV SOLN: 1000.0000 mg | Freq: Once | INTRAVENOUS | Status: DC | PRN

## 2024-02-10 MED ORDER — TAMSULOSIN HCL 0.4 MG PO CAPS
0.4000 mg | ORAL_CAPSULE | Freq: Every day | ORAL | Status: DC
  Administered 2024-02-11: 0.4 mg via ORAL
  Filled 2024-02-10: qty 1

## 2024-02-10 MED ORDER — PHENYLEPHRINE HCL-NACL 20-0.9 MG/250ML-% IV SOLN
INTRAVENOUS | Status: DC | PRN
  Administered 2024-02-10: 25 ug/min via INTRAVENOUS

## 2024-02-10 MED ORDER — PANTOPRAZOLE SODIUM 40 MG PO TBEC
40.0000 mg | DELAYED_RELEASE_TABLET | Freq: Two times a day (BID) | ORAL | Status: DC
  Administered 2024-02-10 – 2024-02-11 (×2): 40 mg via ORAL
  Filled 2024-02-10 (×2): qty 1

## 2024-02-10 MED ORDER — FENTANYL CITRATE (PF) 50 MCG/ML IJ SOSY: 25.0000 ug | PREFILLED_SYRINGE | INTRAMUSCULAR | Status: DC | PRN

## 2024-02-10 MED ORDER — BISACODYL 10 MG RE SUPP: 10.0000 mg | Freq: Every day | RECTAL | Status: DC | PRN

## 2024-02-10 MED ORDER — POLYETHYLENE GLYCOL 3350 17 G PO PACK: 17.0000 g | PACK | Freq: Every day | ORAL | Status: DC | PRN

## 2024-02-10 MED ORDER — ONDANSETRON HCL 4 MG/2ML IJ SOLN: 4.0000 mg | Freq: Once | INTRAMUSCULAR | Status: DC | PRN

## 2024-02-10 MED ORDER — OXYCODONE HCL 5 MG/5ML PO SOLN: 5.0000 mg | Freq: Once | ORAL | Status: DC | PRN

## 2024-02-10 MED ORDER — PROPOFOL 10 MG/ML IV BOLUS
INTRAVENOUS | Status: DC | PRN
  Administered 2024-02-10 (×2): 20 mg via INTRAVENOUS

## 2024-02-10 MED ORDER — CEFAZOLIN SODIUM-DEXTROSE 2-4 GM/100ML-% IV SOLN
2.0000 g | INTRAVENOUS | Status: AC
  Administered 2024-02-10: 2 g via INTRAVENOUS
  Filled 2024-02-10: qty 100

## 2024-02-10 MED ORDER — DEXAMETHASONE SOD PHOSPHATE PF 10 MG/ML IJ SOLN
10.0000 mg | Freq: Once | INTRAMUSCULAR | Status: AC
  Administered 2024-02-11: 10 mg via INTRAVENOUS

## 2024-02-10 MED ORDER — CHLORHEXIDINE GLUCONATE 0.12 % MT SOLN
15.0000 mL | Freq: Once | OROMUCOSAL | Status: AC
  Administered 2024-02-10: 15 mL via OROMUCOSAL

## 2024-02-10 MED ORDER — HYDROCODONE-ACETAMINOPHEN 5-325 MG PO TABS
1.0000 | ORAL_TABLET | ORAL | Status: DC | PRN
  Filled 2024-02-10: qty 2

## 2024-02-10 MED ORDER — AMISULPRIDE (ANTIEMETIC) 5 MG/2ML IV SOLN: 10.0000 mg | Freq: Once | INTRAVENOUS | Status: DC | PRN

## 2024-02-10 MED ORDER — ONDANSETRON HCL 4 MG/2ML IJ SOLN
INTRAMUSCULAR | Status: DC | PRN
  Administered 2024-02-10: 4 mg via INTRAVENOUS

## 2024-02-10 MED ORDER — FENTANYL CITRATE (PF) 100 MCG/2ML IJ SOLN
INTRAMUSCULAR | Status: AC
  Filled 2024-02-10: qty 2

## 2024-02-10 MED ORDER — LACTATED RINGERS IV SOLN: INTRAVENOUS | Status: DC

## 2024-02-10 MED ORDER — CEFAZOLIN SODIUM-DEXTROSE 2-4 GM/100ML-% IV SOLN
2.0000 g | Freq: Four times a day (QID) | INTRAVENOUS | Status: AC
  Administered 2024-02-10 (×2): 2 g via INTRAVENOUS
  Filled 2024-02-10 (×2): qty 100

## 2024-02-10 MED ORDER — MORPHINE SULFATE (PF) 2 MG/ML IV SOLN
0.5000 mg | INTRAVENOUS | Status: DC | PRN
  Administered 2024-02-10 (×2): 1 mg via INTRAVENOUS
  Filled 2024-02-10 (×2): qty 1

## 2024-02-10 MED ORDER — HYDRALAZINE HCL 20 MG/ML IJ SOLN
10.0000 mg | Freq: Four times a day (QID) | INTRAMUSCULAR | Status: DC | PRN
  Administered 2024-02-11 (×2): 10 mg via INTRAVENOUS
  Filled 2024-02-10 (×2): qty 1

## 2024-02-10 MED ORDER — BUPIVACAINE IN DEXTROSE 0.75-8.25 % IT SOLN
INTRATHECAL | Status: DC | PRN
  Administered 2024-02-10: 1.8 mL via INTRATHECAL

## 2024-02-10 MED ORDER — TRAMADOL HCL 50 MG PO TABS
50.0000 mg | ORAL_TABLET | Freq: Four times a day (QID) | ORAL | Status: DC
  Administered 2024-02-10 – 2024-02-11 (×3): 50 mg via ORAL
  Filled 2024-02-10 (×4): qty 1

## 2024-02-10 MED ORDER — ONDANSETRON HCL 4 MG/2ML IJ SOLN
4.0000 mg | Freq: Four times a day (QID) | INTRAMUSCULAR | Status: DC | PRN
  Administered 2024-02-10: 4 mg via INTRAVENOUS
  Filled 2024-02-10: qty 2

## 2024-02-10 MED ORDER — LIDOCAINE 2% (20 MG/ML) 5 ML SYRINGE
INTRAMUSCULAR | Status: DC | PRN
  Administered 2024-02-10: 40 mg via INTRAVENOUS

## 2024-02-10 MED ORDER — MENTHOL 3 MG MT LOZG: 1.0000 | LOZENGE | OROMUCOSAL | Status: DC | PRN

## 2024-02-10 MED ORDER — ORAL CARE MOUTH RINSE: 15.0000 mL | Freq: Once | OROMUCOSAL | Status: AC

## 2024-02-10 MED ORDER — DOCUSATE SODIUM 100 MG PO CAPS
100.0000 mg | ORAL_CAPSULE | Freq: Two times a day (BID) | ORAL | Status: DC
  Administered 2024-02-10 – 2024-02-11 (×2): 100 mg via ORAL
  Filled 2024-02-10 (×2): qty 1

## 2024-02-10 MED ORDER — BUPIVACAINE-EPINEPHRINE (PF) 0.25% -1:200000 IJ SOLN
INTRAMUSCULAR | Status: AC
  Filled 2024-02-10: qty 30

## 2024-02-10 MED ORDER — HYDROCODONE-ACETAMINOPHEN 7.5-325 MG PO TABS
1.0000 | ORAL_TABLET | ORAL | Status: DC | PRN
  Administered 2024-02-10 – 2024-02-11 (×3): 2 via ORAL
  Filled 2024-02-10 (×3): qty 2

## 2024-02-10 MED ORDER — POVIDONE-IODINE 10 % EX SWAB
2.0000 | Freq: Once | CUTANEOUS | Status: AC
  Administered 2024-02-10: 2 via TOPICAL

## 2024-02-10 MED ORDER — ONDANSETRON HCL 4 MG PO TABS: 4.0000 mg | ORAL_TABLET | Freq: Four times a day (QID) | ORAL | Status: DC | PRN

## 2024-02-10 MED ORDER — BUPIVACAINE-EPINEPHRINE (PF) 0.25% -1:200000 IJ SOLN
INTRAMUSCULAR | Status: DC | PRN
  Administered 2024-02-10: 30 mL via PERINEURAL

## 2024-02-10 MED ORDER — TRANEXAMIC ACID-NACL 1000-0.7 MG/100ML-% IV SOLN
1000.0000 mg | INTRAVENOUS | Status: AC
  Administered 2024-02-10: 1000 mg via INTRAVENOUS
  Filled 2024-02-10: qty 100

## 2024-02-10 MED ORDER — METOCLOPRAMIDE HCL 5 MG/ML IJ SOLN: 5.0000 mg | Freq: Three times a day (TID) | INTRAMUSCULAR | Status: DC | PRN

## 2024-02-10 MED ORDER — SODIUM CHLORIDE 0.9 % IV SOLN: INTRAVENOUS | Status: DC

## 2024-02-10 MED ORDER — ACETAMINOPHEN 325 MG PO TABS: 325.0000 mg | ORAL_TABLET | Freq: Four times a day (QID) | ORAL | Status: DC | PRN

## 2024-02-10 SURGICAL SUPPLY — 36 items
BAG COUNTER SPONGE SURGICOUNT (BAG) IMPLANT
BAG ZIPLOCK 12X15 (MISCELLANEOUS) IMPLANT
BLADE SAG 18X100X1.27 (BLADE) ×1 IMPLANT
CATH COUDE FOLEY 2W 5CC 16FR (CATHETERS) IMPLANT
COVER PERINEAL POST (MISCELLANEOUS) ×1 IMPLANT
COVER SURGICAL LIGHT HANDLE (MISCELLANEOUS) ×1 IMPLANT
CUP ACETBLR 54 OD PINNACLE (Hips) IMPLANT
DERMABOND ADVANCED .7 DNX12 (GAUZE/BANDAGES/DRESSINGS) ×1 IMPLANT
DRAPE FOOT SWITCH (DRAPES) ×1 IMPLANT
DRAPE STERI IOBAN 125X83 (DRAPES) ×1 IMPLANT
DRAPE U-SHAPE 47X51 STRL (DRAPES) ×2 IMPLANT
DRSG AQUACEL AG ADV 3.5X10 (GAUZE/BANDAGES/DRESSINGS) ×1 IMPLANT
DURAPREP 26ML APPLICATOR (WOUND CARE) ×1 IMPLANT
ELECT REM PT RETURN 15FT ADLT (MISCELLANEOUS) ×1 IMPLANT
GLOVE BIO SURGEON STRL SZ 6.5 (GLOVE) IMPLANT
GLOVE BIO SURGEON STRL SZ7 (GLOVE) IMPLANT
GLOVE BIO SURGEON STRL SZ8 (GLOVE) ×1 IMPLANT
GLOVE BIOGEL PI IND STRL 7.0 (GLOVE) IMPLANT
GLOVE BIOGEL PI IND STRL 8 (GLOVE) ×1 IMPLANT
GOWN STRL REUS W/ TWL LRG LVL3 (GOWN DISPOSABLE) ×2 IMPLANT
HEAD ARTICULEZE (Hips) IMPLANT
HOLDER FOLEY CATH W/STRAP (MISCELLANEOUS) ×1 IMPLANT
KIT TURNOVER KIT A (KITS) ×1 IMPLANT
LINER NEUTRAL 54X36MM PLUS 4 (Hips) IMPLANT
MANIFOLD NEPTUNE II (INSTRUMENTS) ×1 IMPLANT
PACK ANTERIOR HIP CUSTOM (KITS) ×1 IMPLANT
PENCIL SMOKE EVACUATOR COATED (MISCELLANEOUS) ×1 IMPLANT
SPIKE FLUID TRANSFER (MISCELLANEOUS) ×1 IMPLANT
STEM FEM ACTIS STD SZ4 (Stem) IMPLANT
SUT ETHIBOND NAB CT1 #1 30IN (SUTURE) ×1 IMPLANT
SUT MNCRL AB 4-0 PS2 18 (SUTURE) ×1 IMPLANT
SUT VIC AB 2-0 CT1 TAPERPNT 27 (SUTURE) ×2 IMPLANT
SUTURE STRATFX 0 PDS 27 VIOLET (SUTURE) ×1 IMPLANT
TOWEL GREEN STERILE FF (TOWEL DISPOSABLE) ×1 IMPLANT
TRAY FOLEY MTR SLVR 16FR STAT (SET/KITS/TRAYS/PACK) ×1 IMPLANT
TUBE SUCTION HIGH CAP CLEAR NV (SUCTIONS) ×1 IMPLANT

## 2024-02-10 NOTE — Progress Notes (Signed)
   02/10/24 2315  BiPAP/CPAP/SIPAP  $ Non-Invasive Home Ventilator  Initial  BiPAP/CPAP/SIPAP Pt Type Adult  BiPAP/CPAP/SIPAP DREAMSTATIOND  Mask Type Nasal pillows  Dentures removed? Not applicable  FiO2 (%) 21 %  Patient Home Machine No  Patient Home Mask No  Patient Home Tubing No  Auto Titrate Yes  Minimum cmH2O 5 cmH2O  Maximum cmH2O 20 cmH2O

## 2024-02-10 NOTE — Transfer of Care (Signed)
 Immediate Anesthesia Transfer of Care Note  Patient: [name]  Procedure(s) Performed: ARTHROPLASTY, HIP, TOTAL, ANTERIOR APPROACH (Left: Hip)  Patient Location: PACU  Anesthesia Type:MAC and Spinal  Level of Consciousness: drowsy and patient cooperative  Airway & Oxygen Therapy: Patient Spontanous Breathing and Patient connected to face mask oxygen  Post-op Assessment: Report given to RN and Post -op Vital signs reviewed and stable  Post vital signs: Reviewed and stable  Last Vitals:  Vitals Value Taken Time  BP 104/58 02/10/24 13:00  Temp    Pulse    Resp 14 02/10/24 13:02  SpO2    Vitals shown include unfiled device data.  Last Pain:  Vitals:   02/10/24 0950  TempSrc:   PainSc: 0-No pain         Complications: No notable events documented.

## 2024-02-10 NOTE — Care Plan (Signed)
 Ortho Bundle Case Management Note  Patient Details  Name: [name] MRN: [id number] Date of Birth: 04/28/1940  L THA on 02/10/24   DCP: Home with wife  DME: No needs. Has RW.   PT: [facility]                  DME Arranged:  N/A DME Agency:  NA  HH Arranged:    HH Agency:     Additional Comments: Please contact me with any questions of if this plan should need to change.  Lyle Pepper, CCM  [facility] [phone]  Ext. [phone]   02/10/2024, 10:36 AM

## 2024-02-10 NOTE — Discharge Instructions (Addendum)
 Duane Moan, MD Total Joint Specialist [facility] [address] [place], [place] [address] [phone]  ANTERIOR APPROACH TOTAL HIP REPLACEMENT POSTOPERATIVE DIRECTIONS     Hip Rehabilitation, Guidelines Following Surgery  The results of a hip operation are greatly improved after range of motion and muscle strengthening exercises. Follow all safety measures which are given to protect your hip. If any of these exercises cause increased pain or swelling in your joint, decrease the amount until you are comfortable again. Then slowly increase the exercises. Call your caregiver if you have problems or questions.   BLOOD CLOT PREVENTION Take a 10 mg Xarelto  once a day for three weeks following surgery. Then take an 81 mg Aspirin once a day for three weeks. Then discontinue Aspirin. You may resume your vitamins/supplements once you have discontinued the Xarelto . Do not take any NSAIDs (Advil, Aleve, Ibuprofen, Meloxicam, etc.) until you have discontinued the Xarelto .   HOME CARE INSTRUCTIONS  Remove items at home which could result in a fall. This includes throw rugs or furniture in walking pathways.  ICE to the affected hip as frequently as 20-30 minutes an hour and then as needed for pain and swelling. Continue to use ice on the hip for pain and swelling from surgery. You may notice swelling that will progress down to the foot and ankle. This is normal after surgery. Elevate the leg when you are not up walking on it.   Continue to use the breathing machine which will help keep your temperature down.  It is common for your temperature to cycle up and down following surgery, especially at night when you are not up moving around and exerting yourself.  The breathing machine keeps your lungs expanded and your temperature down.  DIET You may resume your previous home diet once your are discharged from the hospital.  DRESSING / WOUND CARE / SHOWERING You have an  adhesive waterproof bandage over the incision. Leave this in place until your first follow-up appointment. Once you remove this you will not need to place another bandage.  You may begin showering 3 days following surgery, but do not submerge the incision under water .  ACTIVITY For the first 3-5 days, it is important to rest and keep the operative leg elevated. You should, as a general rule, rest for 50 minutes and walk/stretch for 10 minutes per hour. After 5 days, you may slowly increase activity as tolerated.  Perform the exercises you were provided twice a day for about 15-20 minutes each session. Begin these 2 days following surgery. Walk with your walker as instructed. Use the walker until you are comfortable transitioning to a cane. Walk with the cane in the opposite hand of the operative leg. You may discontinue the cane once you are comfortable and walking steadily. Avoid periods of inactivity such as sitting longer than an hour when not asleep. This helps prevent blood clots.  Do not drive a car for 6 weeks or until released by your surgeon.  Do not drive while taking narcotics.  TED HOSE STOCKINGS Wear the elastic stockings on both legs for three weeks following surgery during the day. You may remove them at night while sleeping.  WEIGHT BEARING Weight bearing as tolerated with assist device (walker, cane, etc) as directed, use it as long as suggested by your surgeon or therapist, typically at least 4-6 weeks.  POSTOPERATIVE CONSTIPATION PROTOCOL Constipation - defined medically as fewer than three stools per week and severe constipation as less  than one stool per week.  One of the most common issues patients have following surgery is constipation.  Even if you have a regular bowel pattern at home, your normal regimen is likely to be disrupted due to multiple reasons following surgery.  Combination of anesthesia, postoperative narcotics, change in appetite and fluid intake all can  affect your bowels.  In order to avoid complications following surgery, here are some recommendations in order to help you during your recovery period.  Colace (docusate) - Pick up an over-the-counter form of Colace or another stool softener and take twice a day as long as you are requiring postoperative pain medications.  Take with a full glass of water  daily.  If you experience loose stools or diarrhea, hold the colace until you stool forms back up.  If your symptoms do not get better within 1 week or if they get worse, check with your doctor. Dulcolax (bisacodyl ) - Pick up over-the-counter and take as directed by the product packaging as needed to assist with the movement of your bowels.  Take with a full glass of water .  Use this product as needed if not relieved by Colace only.  MiraLax  (polyethylene glycol) - Pick up over-the-counter to have on hand.  MiraLax  is a solution that will increase the amount of water  in your bowels to assist with bowel movements.  Take as directed and can mix with a glass of water , juice, soda, coffee, or tea.  Take if you go more than two days without a movement.Do not use MiraLax  more than once per day. Call your doctor if you are still constipated or irregular after using this medication for 7 days in a row.  If you continue to have problems with postoperative constipation, please contact the office for further assistance and recommendations.  If you experience "the worst abdominal pain ever" or develop nausea or vomiting, please contact the office immediatly for further recommendations for treatment.  ITCHING  If you experience itching with your medications, try taking only a single pain pill, or even half a pain pill at a time.  You can also use Benadryl  over the counter for itching or also to help with sleep.   MEDICATIONS See your medication summary on the "After Visit Summary" that the nursing staff will review with you prior to discharge.  You may have some home  medications which will be placed on hold until you complete the course of blood thinner medication.  It is important for you to complete the blood thinner medication as prescribed by your surgeon.  Continue your approved medications as instructed at time of discharge.  PRECAUTIONS If you experience chest pain or shortness of breath - call 911 immediately for transfer to the hospital emergency department.  If you develop a fever greater that 101 F, purulent drainage from wound, increased redness or drainage from wound, foul odor from the wound/dressing, or calf pain - CONTACT YOUR SURGEON.                                                   FOLLOW-UP APPOINTMENTS Make sure you keep all of your appointments after your operation with your surgeon and caregivers. You should call the office at the above phone number and make an appointment for approximately two weeks after the date of your surgery or on the date  instructed by your surgeon outlined in the "After Visit Summary".  RANGE OF MOTION AND STRENGTHENING EXERCISES  These exercises are designed to help you keep full movement of your hip joint. Follow your caregiver's or physical therapist's instructions. Perform all exercises about fifteen times, three times per day or as directed. Exercise both hips, even if you have had only one joint replacement. These exercises can be done on a training (exercise) mat, on the floor, on a table or on a bed. Use whatever works the best and is most comfortable for you. Use music or television while you are exercising so that the exercises are a pleasant break in your day. This will make your life better with the exercises acting as a break in routine you can look forward to.  Lying on your back, slowly slide your foot toward your buttocks, raising your knee up off the floor. Then slowly slide your foot back down until your leg is straight again.  Lying on your back spread your legs as far apart as you can without causing  discomfort.  Lying on your side, raise your upper leg and foot straight up from the floor as far as is comfortable. Slowly lower the leg and repeat.  Lying on your back, tighten up the muscle in the front of your thigh (quadriceps muscles). You can do this by keeping your leg straight and trying to raise your heel off the floor. This helps strengthen the largest muscle supporting your knee.  Lying on your back, tighten up the muscles of your buttocks both with the legs straight and with the knee bent at a comfortable angle while keeping your heel on the floor.   POST-OPERATIVE OPIOID TAPER INSTRUCTIONS: It is important to wean off of your opioid medication as soon as possible. If you do not need pain medication after your surgery it is ok to stop day one. Opioids include: Codeine, Hydrocodone (Norco, Vicodin), Oxycodone (Percocet, oxycontin ) and hydromorphone  amongst others.  Long term and even short term use of opiods can cause: Increased pain response Dependence Constipation Depression Respiratory depression And more.  Withdrawal symptoms can include Flu like symptoms Nausea, vomiting And more Techniques to manage these symptoms Hydrate well Eat regular healthy meals Stay active Use relaxation techniques(deep breathing, meditating, yoga) Do Not substitute Alcohol to help with tapering If you have been on opioids for less than two weeks and do not have pain than it is ok to stop all together.  Plan to wean off of opioids This plan should start within one week post op of your joint replacement. Maintain the same interval or time between taking each dose and first decrease the dose.  Cut the total daily intake of opioids by one tablet each day Next start to increase the time between doses. The last dose that should be eliminated is the evening dose.   IF YOU ARE TRANSFERRED TO A SKILLED REHAB FACILITY If the patient is transferred to a skilled rehab facility following release from the  hospital, a list of the current medications will be sent to the facility for the patient to continue.  When discharged from the skilled rehab facility, please have the facility set up the patient's Home Health Physical Therapy prior to being released. Also, the skilled facility will be responsible for providing the patient with their medications at time of release from the facility to include their pain medication, the muscle relaxants, and their blood thinner medication. If the patient is still at the rehab facility at  time of the two week follow up appointment, the skilled rehab facility will also need to assist the patient in arranging follow up appointment in our office and any transportation needs.  MAKE SURE YOU:  Understand these instructions.  Get help right away if you are not doing well or get worse.    DENTAL ANTIBIOTICS:  In most cases prophylactic antibiotics for Dental procdeures after total joint surgery are not necessary.  Exceptions are as follows:  1. History of prior total joint infection  2. Severely immunocompromised (Organ Transplant, cancer chemotherapy, Rheumatoid biologic meds such as Humera)  3. Poorly controlled diabetes (A1C &gt; 8.0, blood glucose over 200)  If you have one of these conditions, contact your surgeon for an antibiotic prescription, prior to your dental procedure.    Pick up stool softner and laxative for home use following surgery while on pain medications. Do not submerge incision under water . Please use good hand washing techniques while changing dressing each day. May shower starting three days after surgery. Please use a clean towel to pat the incision dry following showers. Continue to use ice for pain and swelling after surgery. Do not use any lotions or creams on the incision until instructed by your surgeon.    Information on my medicine - XARELTO  (Rivaroxaban )    Why was Xarelto  prescribed for you? Xarelto  was prescribed  for you to reduce the risk of blood clots forming after orthopedic surgery. The medical term for these abnormal blood clots is venous thromboembolism (VTE).  What do you need to know about xarelto  ? Take your Xarelto  ONCE DAILY at the same time every day. You may take it either with or without food.  If you have difficulty swallowing the tablet whole, you may crush it and mix in applesauce just prior to taking your dose.  Take Xarelto  exactly as prescribed by your doctor and DO NOT stop taking Xarelto  without talking to the doctor who prescribed the medication.  Stopping without other VTE prevention medication to take the place of Xarelto  may increase your risk of developing a clot.  After discharge, you should have regular check-up appointments with your healthcare provider that is prescribing your Xarelto .    What do you do if you miss a dose? If you miss a dose, take it as soon as you remember on the same day then continue your regularly scheduled once daily regimen the next day. Do not take two doses of Xarelto  on the same day.   Important Safety Information A possible side effect of Xarelto  is bleeding. You should call your healthcare provider right away if you experience any of the following: Bleeding from an injury or your nose that does not stop. Unusual colored urine (red or dark brown) or unusual colored stools (red or black). Unusual bruising for unknown reasons. A serious fall or if you hit your head (even if there is no bleeding).  Some medicines may interact with Xarelto  and might increase your risk of bleeding while on Xarelto . To help avoid this, consult your healthcare provider or pharmacist prior to using any new prescription or non-prescription medications, including herbals, vitamins, non-steroidal anti-inflammatory drugs (NSAIDs) and supplements.  This website has more information on Xarelto : www.xarelto .com.

## 2024-02-10 NOTE — Progress Notes (Signed)
 PT Cancellation Note  Patient Details Name: [name] MRN: [id number] DOB: 02/25/1941   Cancelled Treatment:    Reason Eval/Treat Not Completed: Other (comment). PT arrived 1857 pt in bed and ped pan on L side, spouse present. Pt reports the pain is better managed however now nauseated. Pt states able to transfer bed <> [facility] with nursing staff whom endorses pt report. Nurse reports pt provided with pain medication just prior to PT arrival and attributes nausea to medication (Norco x 2). Pt and spouse agreeable to holding PT evaluation until 12/9. PT to continue to follow.   [place], PT Acute Rehab   [place] VEAR Drone 02/10/2024, 7:07 PM

## 2024-02-10 NOTE — Op Note (Signed)
 OPERATIVE REPORT- TOTAL HIP ARTHROPLASTY   PREOPERATIVE DIAGNOSIS: Osteoarthritis of the Left hip.   POSTOPERATIVE DIAGNOSIS: Osteoarthritis of the Left  hip.   PROCEDURE: Left total hip arthroplasty, anterior approach.   SURGEON: Dempsey Moan, MD   ASSISTANT: Roxie Mess, PA-C  ANESTHESIA:  Spinal  ESTIMATED BLOOD LOSS:-200 mL    DRAINS: None  COMPLICATIONS: None   CONDITION: PACU - hemodynamically stable.   BRIEF CLINICAL NOTE: [name] is a 83 y.o. male who has advanced end-  stage arthritis of their Left  hip with progressively worsening pain and  dysfunction.The patient has failed nonoperative management and presents for  total hip arthroplasty.   PROCEDURE IN DETAIL: After successful administration of spinal  anesthetic, the traction boots for the [facility] bed were placed on both  feet and the patient was placed onto the [facility] bed, boots placed into the leg  holders. The Left hip was then isolated from the perineum with plastic  drapes and prepped and draped in the usual sterile fashion. ASIS and  greater trochanter were marked and a oblique incision was made, starting  at about 1 cm lateral and 2 cm distal to the ASIS and coursing towards  the anterior cortex of the femur. The skin was cut with a 10 blade  through subcutaneous tissue to the level of the fascia overlying the  tensor fascia lata muscle. The fascia was then incised in line with the  incision at the junction of the anterior third and posterior 2/3rd. The  muscle was teased off the fascia and then the interval between the TFL  and the rectus was developed. The Hohmann retractor was then placed at  the top of the femoral neck over the capsule. The vessels overlying the  capsule were cauterized and the fat on top of the capsule was removed.  A Hohmann retractor was then placed anterior underneath the rectus  femoris to give exposure to the entire anterior capsule. A T-shaped  capsulotomy was  performed. The edges were tagged and the femoral head  was identified.       Osteophytes are removed off the superior acetabulum.  The femoral neck was then cut in situ with an oscillating saw. Traction  was then applied to the left lower extremity utilizing the [facility]  traction. The femoral head was then removed. Retractors were placed  around the acetabulum and then circumferential removal of the labrum was  performed. Osteophytes were also removed. Reaming starts at 51 mm to  medialize and  Increased in 2 mm increments to 53 mm. We reamed in  approximately 40 degrees of abduction, 20 degrees anteversion. A 54 mm  pinnacle acetabular shell was then impacted in anatomic position under  fluoroscopic guidance with excellent purchase. We did not need to place  any additional dome screws. A 36 mm neutral + 4 marathon liner was then  placed into the acetabular shell.       The femoral lift was then placed along the lateral aspect of the femur  just distal to the vastus ridge. The leg was  externally rotated and capsule  was stripped off the inferior aspect of the femoral neck down to the  level of the lesser trochanter, this was done with electrocautery. The femur was lifted after this was performed. The  leg was then placed in an extended and adducted position essentially delivering the femur. We also removed the capsule superiorly and the piriformis from the piriformis fossa to  gain excellent exposure of the  proximal femur. Rongeur was used to remove some cancellous bone to get  into the lateral portion of the proximal femur for placement of the  initial starter reamer. The starter broaches was placed  the starter broach  and was shown to go down the center of the canal. Broaching  with the Actis system was then performed starting at size 0  coursing  Up to size 4. A size 4 had excellent torsional and rotational  and axial stability. The trial standard offset neck was then placed  with a 36 + 5  trial head. The hip was then reduced. We confirmed that  the stem was in the canal both on AP and lateral x-rays. It also has excellent sizing. The hip was reduced with outstanding stability through full extension and full external rotation.. AP pelvis was taken and the leg lengths were measured and found to be equal. Hip was then dislocated again and the femoral head and neck removed. The  femoral broach was removed. Size 4 Actis stem with a standard offset  neck was then impacted into the femur following native anteversion. Has  excellent purchase in the canal. Excellent torsional and rotational and  axial stability. It is confirmed to be in the canal on AP and lateral  fluoroscopic views. The 36 + 5 metal head was placed and the hip  reduced with outstanding stability. Again AP pelvis was taken and it  confirmed that the leg lengths were equal. The wound was then copiously  irrigated with saline solution and the capsule reattached and repaired  with Ethibond suture. 30 ml of .25% Bupivicaine was  injected into the capsule and into the edge of the tensor fascia lata as well as subcutaneous tissue. The fascia overlying the tensor fascia lata was then closed with a running #1 V-Loc. Subcu was closed with interrupted 2-0 Vicryl and subcuticular running 4-0 Monocryl. Incision was cleaned  and dried. Steri-Strips and a bulky sterile dressing applied. The patient was awakened and transported to  recovery in stable condition.        Please note that a surgical assistant was a medical necessity for this procedure to perform it in a safe and expeditious manner. Assistant was necessary to provide appropriate retraction of vital neurovascular structures and to prevent femoral fracture and allow for anatomic placement of the prosthesis.  Dempsey Moan, M.D.

## 2024-02-10 NOTE — Interval H&P Note (Signed)
 History and Physical Interval Note:  02/10/2024 9:30 AM  [name]  has presented today for surgery, with the diagnosis of Left hip osteoarthritis.  The various methods of treatment have been discussed with the patient and family. After consideration of risks, benefits and other options for treatment, the patient has consented to  Procedure(s): ARTHROPLASTY, HIP, TOTAL, ANTERIOR APPROACH (Left) as a surgical intervention.  The patient's history has been reviewed, patient examined, no change in status, stable for surgery.  I have reviewed the patient's chart and labs.  Questions were answered to the patient's satisfaction.     Dempsey Reyes [place]

## 2024-02-11 ENCOUNTER — Encounter (HOSPITAL_COMMUNITY): Payer: Self-pay | Admitting: Orthopedic Surgery

## 2024-02-11 ENCOUNTER — Other Ambulatory Visit (HOSPITAL_COMMUNITY): Payer: Self-pay

## 2024-02-11 LAB — CBC
HCT: 35.1 % — ABNORMAL LOW (ref 39.0–52.0)
Hemoglobin: 11.5 g/dL — ABNORMAL LOW (ref 13.0–17.0)
MCH: 30.9 pg (ref 26.0–34.0)
MCHC: 32.8 g/dL (ref 30.0–36.0)
MCV: 94.4 fL (ref 80.0–100.0)
Platelets: 180 K/uL (ref 150–400)
RBC: 3.72 MIL/uL — ABNORMAL LOW (ref 4.22–5.81)
RDW: 13.7 % (ref 11.5–15.5)
WBC: 10.7 K/uL — ABNORMAL HIGH (ref 4.0–10.5)
nRBC: 0 % (ref 0.0–0.2)

## 2024-02-11 LAB — BASIC METABOLIC PANEL WITH GFR
Anion gap: 9 (ref 5–15)
BUN: 12 mg/dL (ref 8–23)
CO2: 26 mmol/L (ref 22–32)
Calcium: 9 mg/dL (ref 8.9–10.3)
Chloride: 103 mmol/L (ref 98–111)
Creatinine, Ser: 1.28 mg/dL — ABNORMAL HIGH (ref 0.61–1.24)
GFR, Estimated: 55 mL/min — ABNORMAL LOW (ref >60)
Glucose, Bld: 121 mg/dL — ABNORMAL HIGH (ref 70–99)
Potassium: 4.4 mmol/L (ref 3.5–5.1)
Sodium: 139 mmol/L (ref 135–145)

## 2024-02-11 MED ORDER — HYDROCODONE-ACETAMINOPHEN 5-325 MG PO TABS
1.0000 | ORAL_TABLET | ORAL | 0 refills | Status: AC | PRN
  Filled 2024-02-11: qty 42, 7d supply, fill #0

## 2024-02-11 MED ORDER — ONDANSETRON HCL 4 MG PO TABS
4.0000 mg | ORAL_TABLET | Freq: Four times a day (QID) | ORAL | 0 refills | Status: AC | PRN
  Filled 2024-02-11: qty 20, 5d supply, fill #0

## 2024-02-11 MED ORDER — TRAMADOL HCL 50 MG PO TABS
50.0000 mg | ORAL_TABLET | Freq: Four times a day (QID) | ORAL | 0 refills | Status: AC | PRN
  Filled 2024-02-11: qty 40, 5d supply, fill #0

## 2024-02-11 MED ORDER — RIVAROXABAN 10 MG PO TABS
10.0000 mg | ORAL_TABLET | Freq: Every day | ORAL | 0 refills | Status: AC
  Filled 2024-02-11: qty 20, 20d supply, fill #0

## 2024-02-11 MED ORDER — METHOCARBAMOL 500 MG PO TABS
500.0000 mg | ORAL_TABLET | Freq: Four times a day (QID) | ORAL | 0 refills | Status: AC | PRN
  Filled 2024-02-11: qty 40, 10d supply, fill #0

## 2024-02-11 NOTE — Evaluation (Signed)
 Physical Therapy Evaluation Patient Details Name: [name] MRN: [id number] DOB: March 14, 1940 Today's Date: 02/11/2024  History of Present Illness  83 yo male presents to therapy s/p L THA, anterior approach on 02/10/2024 due to failure of conservative measures. Pt PMH includes but is not limited to: OA, DOE, cirrhosis of liver, anemia, prostate ca, CKD IIIb, renal cyst, B LE edema, GERD,  L RTC impingement syndrome, anxiety, HLD, HTN, back surgery, and B TKA.  Clinical Impression    [name] is a 83 y.o. male POD 0 s/p L THA. Patient reports mod I with mobility at baseline. Patient is now limited by functional impairments (see PT problem list below) and requires mod A for bed mobility and min A for transfers. Patient was unable to safely ambulate at time of eval. Patient instructed in exercise to facilitate ROM and circulation to manage edema pt required AA for heel slides able to perform quad sets and ankle pumps.  Patient will benefit from continued skilled PT interventions to address impairments and progress towards PLOF. Acute PT will follow to progress mobility and stair training in preparation for safe discharge home with family support and HEP only. Pt concerning for elevated Bp overnight.  -Bp at rest semi reclined in bed 127/60 (78 PR)  Pt reported feeling dizzy when seated EOB and Bp 138/57 (85 PR) -Bp immediate standing 100/53 (84 PR) pt reported vision changes with pt stating things do not look real pt was       able to locate and name items on desk in personal room, read headline on cover of magazine and do head turns to locate items in room with no apparent visual discrepancies and pt unable to expound upon meaning of items not looking real -Bp s/p 3 min standing 110/58 (92 PR) pt continues to report dizziness        If plan is discharge home, recommend the following: A little help with walking and/or transfers;A little help with bathing/dressing/bathroom;Assistance with  cooking/housework;Assist for transportation;Help with stairs or ramp for entrance   Can travel by private vehicle        Equipment Recommendations None recommended by PT  Recommendations for Other Services       Functional Status Assessment Patient has had a recent decline in their functional status and demonstrates the ability to make significant improvements in function in a reasonable and predictable amount of time.     Precautions / Restrictions Precautions Precautions: Fall (orthostatic) Restrictions Weight Bearing Restrictions Per Provider Order: No      Mobility  Bed Mobility Overal bed mobility: Needs Assistance Bed Mobility: Supine to Sit     Supine to sit: Mod assist, HOB elevated, Used rails     General bed mobility comments: increased time, cues and effort for trunk flexion with pt offloading L LE in sitting    Transfers Overall transfer level: Needs assistance Equipment used: Rolling Colavito (2 wheels) Transfers: Sit to/from Stand, Bed to chair/wheelchair/BSC Sit to Stand: Min assist, From elevated surface Stand pivot transfers: Min assist         General transfer comment: cues for push to stand with R UE, cues for maintaining body position inside RW for SPT and pt demonstrated absent eccentric control to sitting surface    Ambulation/Gait               General Gait Details: NT due to symptomatic orthostatic hypotension with pt reporting changes in vision  Stairs  Wheelchair Mobility     Tilt Bed    Modified Rankin (Stroke Patients Only)       Balance Overall balance assessment: Needs assistance Sitting-balance support: Feet supported Sitting balance-Leahy Scale: Fair   Postural control: Right lateral lean Standing balance support: Bilateral upper extremity supported, During functional activity, Reliant on assistive device for balance Standing balance-Leahy Scale: Poor                                Pertinent Vitals/Pain Pain Assessment Pain Assessment: 0-10 Pain Score: 5  Pain Location: L LE and hip Pain Descriptors / Indicators: Aching, Constant, Discomfort, Dull, Grimacing, Operative site guarding Pain Intervention(s): Limited activity within patient's tolerance, Monitored during session, Premedicated before session, Repositioned, Ice applied    Home Living Family/patient expects to be discharged to:: Private residence Living Arrangements: Spouse/significant other Available Help at Discharge: Family Type of Home: House Home Access: Stairs to enter Entrance Stairs-Rails: Right;Left;Can reach both Entrance Stairs-Number of Steps: 3   Home Layout: One level Home Equipment: Agricultural Consultant (2 wheels);Rollator (4 wheels);Cane - single point      Prior Function Prior Level of Function : Independent/Modified Independent;Driving             Mobility Comments: mod I with use of SPC for all ADLs self care tasks and IADLs       Extremity/Trunk Assessment        Lower Extremity Assessment Lower Extremity Assessment: LLE deficits/detail LLE Deficits / Details: ankle DF/PF LLE Sensation: decreased light touch (pt reports new onset since surgery)    Cervical / Trunk Assessment Cervical / Trunk Assessment: Normal  Communication   Communication Communication: No apparent difficulties    Cognition Arousal: Alert Behavior During Therapy: WFL for tasks assessed/performed   PT - Cognitive impairments: No apparent impairments                         Following commands: Intact       Cueing       General Comments      Exercises Total Joint Exercises Ankle Circles/Pumps: AROM, Both, 15 reps Quad Sets: AROM, Left, 5 reps Heel Slides: AAROM, Left, 5 reps   Assessment/Plan    PT Assessment Patient needs continued PT services  PT Problem List Decreased strength;Decreased range of motion;Decreased activity tolerance;Decreased balance;Decreased  mobility;Decreased coordination;Pain       PT Treatment Interventions DME instruction;Gait training;Stair training;Functional mobility training;Therapeutic activities;Therapeutic exercise;Balance training;Neuromuscular re-education;Patient/family education;Modalities    PT Goals (Current goals can be found in the Care Plan section)  Acute Rehab PT Goals Patient Stated Goal: IND for all mobilty no cane and no pain PT Goal Formulation: With patient Time For Goal Achievement: 02/25/24 Potential to Achieve Goals: Good    Frequency 7X/week     Co-evaluation               AM-PAC PT 6 Clicks Mobility  Outcome Measure Help needed turning from your back to your side while in a flat bed without using bedrails?: A Little Help needed moving from lying on your back to sitting on the side of a flat bed without using bedrails?: A Lot Help needed moving to and from a bed to a chair (including a wheelchair)?: A Little Help needed standing up from a chair using your arms (e.g., wheelchair or bedside chair)?: A Little Help needed to walk in hospital room?: Total  Help needed climbing 3-5 steps with a railing? : Total 6 Click Score: 13    End of Session Equipment Utilized During Treatment: Gait belt Activity Tolerance: Patient limited by pain;Other (comment) (dizziness) Patient left: in chair;with call bell/phone within reach;with family/visitor present Nurse Communication: Mobility status;Other (comment) (Bp findings and pt reported vision changes) PT Visit Diagnosis: Unsteadiness on feet (R26.81);Other abnormalities of gait and mobility (R26.89);Muscle weakness (generalized) (M62.81);Difficulty in walking, not elsewhere classified (R26.2);Pain Pain - Right/Left: Left Pain - part of body: Hip;Leg    Time: 1010-1048 PT Time Calculation (min) (ACUTE ONLY): 38 min   Charges:   PT Evaluation $PT Eval Low Complexity: 1 Low PT Treatments $Therapeutic Exercise: 8-22 mins $Therapeutic  Activity: 8-22 mins PT General Charges $$ ACUTE PT VISIT: 1 Visit         [place], PT Acute Rehab   [place] VEAR Drone 02/11/2024, 11:07 AM

## 2024-02-11 NOTE — Progress Notes (Signed)
 Discharge medications delivered to patient at the bedside.

## 2024-02-11 NOTE — Anesthesia Postprocedure Evaluation (Signed)
 Anesthesia Post Note  Patient: [name]  Procedure(s) Performed: ARTHROPLASTY, HIP, TOTAL, ANTERIOR APPROACH (Left: Hip)     Patient location during evaluation: PACU Anesthesia Type: MAC Level of consciousness: awake Pain management: pain level controlled Vital Signs Assessment: post-procedure vital signs reviewed and stable Respiratory status: spontaneous breathing Cardiovascular status: blood pressure returned to baseline Postop Assessment: spinal receding and no apparent nausea or vomiting Anesthetic complications: no   No notable events documented.             Lauraine DASEN Colhoun

## 2024-02-11 NOTE — Progress Notes (Signed)
 Physical Therapy Treatment Patient Details Name: [name] MRN: [id number] DOB: May 29, 1940 Today's Date: 02/11/2024   History of Present Illness 83 yo male presents to therapy s/p L THA, anterior approach on 02/10/2024 due to failure of conservative measures. Pt PMH includes but is not limited to: OA, DOE, cirrhosis of liver, anemia, prostate ca, CKD IIIb, renal cyst, B LE edema, GERD,  L RTC impingement syndrome, anxiety, HLD, HTN, back surgery, and B TKA.    PT Comments  POD #1 PM session.  [name] is a 83 y.o. male POD 0 s/p L THA. Patient reports mod I with mobility at baseline. Patient is now limited by functional impairments (see PT problem list below) and requires min A for sit to supine and ed/instruction provided on use of gait belt as leg lifter and CGA for transfers from a variety of surfaces and with RW. Patient was able to ambulate 60 feet with RW and CGA in hallway and 25 feet in personal room with cues for safe Friedl management. Patient educated on safe sequencing for stair mobility with B handrails, fall risk prevention, use of CP/ice, pain goal and management pt and spouse verbalized understanding of safe guarding position for people assisting with mobility. Patient instructed in exercises to facilitate ROM and circulation reviewed and HO provided. Patient will benefit from continued skilled PT interventions to address impairments and progress towards PLOF. Patient has met mobility goals at adequate level for discharge home with family support and HEP; will continue to follow if pt continues acute stay to progress towards Mod I goals. No reports of dizziness nor changes in vision/perception with positional changes with PM session and pt noted to have improved L LE motor coordination and strength required for IND and safety with functional mobility tasks as required in home setting. Pt and spouse motivated to d/c home today.  Bp seated in recliner 128/64 (78 PR) Bp standing  124/64 (92 PR)      If plan is discharge home, recommend the following: A little help with walking and/or transfers;A little help with bathing/dressing/bathroom;Assistance with cooking/housework;Assist for transportation;Help with stairs or ramp for entrance   Can travel by private vehicle        Equipment Recommendations  None recommended by PT    Recommendations for Other Services       Precautions / Restrictions Precautions Precautions: Fall Restrictions Weight Bearing Restrictions Per Provider Order: No     Mobility  Bed Mobility Overal bed mobility: Needs Assistance Bed Mobility: Sit to Supine     Supine to sit: Min assist     General bed mobility comments: increased time, cues with pt instruction on use of gait belt as leg lifter    Transfers Overall transfer level: Needs assistance Equipment used: Rolling Mastro (2 wheels) Transfers: Sit to/from Stand Sit to Stand: Contact guard assist Stand pivot transfers: Min assist         General transfer comment: cues for push to stand from recliner and reaching posteriorly for sitting surface including commode and bed with pt demonstrating improved eccentric control and no reports of dizziness with positional changes    Ambulation/Gait Ambulation/Gait assistance: Contact guard assist Gait Distance (Feet): 60 Feet Assistive device: Rolling Doren (2 wheels) Gait Pattern/deviations: Step-to pattern, Antalgic, Trunk flexed, Decreased stance time - left Gait velocity: decreased     General Gait Details: min cues for RW management with obstacle navigation in personal room, safety with turns and instruction provided for side stepping and  retrograde patterns with CGA and cues no overt LOB   Stairs Stairs: Yes Stairs assistance: Contact guard assist Stair Management: Two rails Number of Stairs: 3 General stair comments: cues for step to patttern with pt attempting reciprocal when ascending steps, pt able to follow  commands for step to pattern when decending steps   Wheelchair Mobility     Tilt Bed    Modified Rankin (Stroke Patients Only)       Balance Overall balance assessment: Needs assistance Sitting-balance support: Feet supported Sitting balance-Leahy Scale: Fair   Postural control: Right lateral lean Standing balance support: Bilateral upper extremity supported, During functional activity, Reliant on assistive device for balance Standing balance-Leahy Scale: Poor                              Communication Communication Communication: No apparent difficulties  Cognition Arousal: Alert Behavior During Therapy: WFL for tasks assessed/performed   PT - Cognitive impairments: No apparent impairments                         Following commands: Intact      Cueing    Exercises Total Joint Exercises Ankle Circles/Pumps: AROM, Both, 15 reps Quad Sets: AROM, Left, 5 reps Heel Slides: Left, 5 reps, AROM Hip ABduction/ADduction: AROM, 5 reps, Standing Long Arc Quad: AROM, Left, 5 reps, Seated Knee Flexion: AROM, Left, 5 reps, Standing Marching in Standing: AROM, Left, 5 reps, Standing Standing Hip Extension: AROM, Left, 5 reps, Standing    General Comments        Pertinent Vitals/Pain Pain Assessment Pain Assessment: 0-10 Pain Score: 3  Pain Location: L LE and hip Pain Descriptors / Indicators: Aching, Constant, Discomfort, Dull, Grimacing, Operative site guarding Pain Intervention(s): Limited activity within patient's tolerance, Premedicated before session, Monitored during session, Repositioned, Ice applied    Home Living                          Prior Function            PT Goals (current goals can now be found in the care plan section) Acute Rehab PT Goals Patient Stated Goal: IND for all mobilty no cane and no pain PT Goal Formulation: With patient Time For Goal Achievement: 02/25/24 Potential to Achieve Goals: Good Progress  towards PT goals: Progressing toward goals    Frequency    7X/week      PT Plan      Co-evaluation              AM-PAC PT 6 Clicks Mobility   Outcome Measure  Help needed turning from your back to your side while in a flat bed without using bedrails?: A Little Help needed moving from lying on your back to sitting on the side of a flat bed without using bedrails?: A Little Help needed moving to and from a bed to a chair (including a wheelchair)?: A Little Help needed standing up from a chair using your arms (e.g., wheelchair or bedside chair)?: A Little Help needed to walk in hospital room?: A Little Help needed climbing 3-5 steps with a railing? : A Little 6 Click Score: 18    End of Session Equipment Utilized During Treatment: Gait belt Activity Tolerance: Patient tolerated treatment well;No increased pain Patient left: with call bell/phone within reach;with family/visitor present;in bed Nurse Communication: Mobility status;Other (comment) (pt  readiness for d/c from PT standpoint) PT Visit Diagnosis: Unsteadiness on feet (R26.81);Other abnormalities of gait and mobility (R26.89);Muscle weakness (generalized) (M62.81);Difficulty in walking, not elsewhere classified (R26.2);Pain Pain - Right/Left: Left Pain - part of body: Hip;Leg     Time: 8576-8492 PT Time Calculation (min) (ACUTE ONLY): 44 min  Charges:    $Gait Training: 8-22 mins $Therapeutic Exercise: 8-22 mins $Therapeutic Activity: 8-22 mins PT General Charges $$ ACUTE PT VISIT: 1 Visit                     [place], PT Acute Rehab    [place] VEAR Drone 02/11/2024, 3:55 PM

## 2024-02-11 NOTE — Progress Notes (Signed)
   Subjective: 1 Day Post-Op Procedure(s) (LRB): ARTHROPLASTY, HIP, TOTAL, ANTERIOR APPROACH (Left) Patient reports pain as mild.   Patient seen in rounds by Dr. Melodi. Patient with elevated BP overnight. Improved this morning, of note pain also much better. Denies chest pain or SOB. Foley catheter removed this AM. We will continue therapy today  Objective: Vital signs in last 24 hours: Temp:  [97.5 F (36.4 C)-99.8 F (37.7 C)] 98.2 F (36.8 C) (12/09 0646) Pulse Rate:  [41-98] 73 (12/09 0646) Resp:  [12-19] 19 (12/09 0646) BP: (104-207)/(55-95) 183/83 (12/09 0646) SpO2:  [93 %-100 %] 97 % (12/09 0646) FiO2 (%):  [21 %] 21 % (12/08 2315) Weight:  [117.9 kg] 117.9 kg (12/08 0950)  Intake/Output from previous day:  Intake/Output Summary (Last 24 hours) at 02/11/2024 0750 Last data filed at 02/11/2024 0700 Gross per 24 hour  Intake 2915.66 ml  Output 1150 ml  Net 1765.66 ml     Intake/Output this shift: No intake/output data recorded.  Labs: Recent Labs    02/11/24 0332  HGB 11.5*   Recent Labs    02/11/24 0332  WBC 10.7*  RBC 3.72*  HCT 35.1*  PLT 180   Recent Labs    02/11/24 0332  NA 139  K 4.4  CL 103  CO2 26  BUN 12  CREATININE 1.28*  GLUCOSE 121*  CALCIUM 9.0   No results for input(s): LABPT, INR in the last 72 hours.  Exam: General - Patient is Alert and Oriented Extremity - Neurologically intact Neurovascular intact Sensation intact distally Dorsiflexion/Plantar flexion intact Dressing - dressing C/D/I Motor Function - intact, moving foot and toes well on exam.   Past Medical History:  Diagnosis Date   Allergy    Anxiety    Arthritis    Cataract    beginning stage bilateral   Diverticulosis    Dyspnea    Elevated PSA    Esophageal stricture    Esophagitis    GERD (gastroesophageal reflux disease)    Gout    History of kidney stones    Hyperlipidemia    Hypertension    Inguinal hernia    Internal hemorrhoids     Pancreatitis    Prostate cancer (HCC)    Tubular adenoma of colon     Assessment/Plan: 1 Day Post-Op Procedure(s) (LRB): ARTHROPLASTY, HIP, TOTAL, ANTERIOR APPROACH (Left) Principal Problem:   OA (osteoarthritis) of hip Active Problems:   Primary osteoarthritis of left hip  Estimated body mass index is 39.53 kg/m as calculated from the following:   Height as of this encounter: 5' 8 (1.727 m).   Weight as of this encounter: 117.9 kg. Advance diet Up with therapy D/C IV fluids  DVT Prophylaxis - Xarelto  Weight bearing as tolerated. Continue therapy.  Plan is to go Home after hospital stay. Plan for discharge with HEP later today if progresses with therapy and blood pressure stays controlled. Follow-up in the office in 2 weeks.  The PDMP database was reviewed today prior to any opioid medications being prescribed to this patient.  Roxie Mess, PA-C Orthopedic Surgery [phone] 02/11/2024, 7:50 AM

## 2024-02-11 NOTE — TOC Transition Note (Signed)
 Transition of Care [facility]) - Discharge Note   Patient Details  Name: [name] MRN: [id number] Date of Birth: [birth year]  [facility] CM/SW Contact:  NORMAN ASPEN, LCSW Phone Number: 02/11/2024, 9:49 AM   Clinical Narrative:     Met with pt who confirms he has needed DME in the home.  Plan for HEP.  No further IP CM needs.  Final next level of care: Home/Self Care Barriers to Discharge: No Barriers Identified   Patient Goals and CMS Choice Patient states their goals for this hospitalization and ongoing recovery are:: return home          Discharge Placement                       Discharge Plan and Services Additional resources added to the After Visit Summary for                  DME Arranged: N/A DME Agency: NA                  Social Drivers of Health (SDOH) Interventions SDOH Screenings   Food Insecurity: No Food Insecurity (02/10/2024)  Housing: Low Risk  (02/10/2024)  Transportation Needs: No Transportation Needs (02/10/2024)  Utilities: Not At Risk (02/10/2024)  Alcohol Screen: Low Risk  (10/29/2023)  Depression (PHQ2-9): Low Risk  (12/26/2023)  Financial Resource Strain: Low Risk  (12/16/2023)  Physical Activity: Insufficiently Active (12/16/2023)  Social Connections: Socially Integrated (02/10/2024)  Stress: No Stress Concern Present (12/16/2023)  Tobacco Use: Low Risk  (02/10/2024)  Health Literacy: Adequate Health Literacy (10/30/2023)     Readmission Risk Interventions     No data to display

## 2024-02-11 NOTE — Care Management Obs Status (Signed)
 MEDICARE OBSERVATION STATUS NOTIFICATION   Patient Details  Name: [name] MRN: [id number] Date of Birth: 04/13/1940   Medicare Observation Status Notification Given:  Yes    HOYLEDORIAN, LCSW 02/11/2024, 11:00 AM

## 2024-02-11 NOTE — Plan of Care (Signed)
  Problem: Health Behavior/Discharge Planning: Goal: Ability to manage health-related needs will improve Outcome: Progressing   Problem: Clinical Measurements: Goal: Diagnostic test results will improve Outcome: Progressing   Problem: Pain Managment: Goal: General experience of comfort will improve and/or be controlled Outcome: Progressing   Problem: Skin Integrity: Goal: Risk for impaired skin integrity will decrease Outcome: Progressing   Problem: Clinical Measurements: Goal: Postoperative complications will be avoided or minimized Outcome: Progressing   Problem: Pain Management: Goal: Pain level will decrease with appropriate interventions Outcome: Progressing

## 2024-02-12 NOTE — Discharge Summary (Signed)
 Patient ID: [name] MRN: [id number] DOB/AGE: May 16, 1940 83 y.o.  Admit date: 02/10/2024 Discharge date: 02/11/2024  Admission Diagnoses:  Principal Problem:   OA (osteoarthritis) of hip Active Problems:   Primary osteoarthritis of left hip   Discharge Diagnoses:  Same  Past Medical History:  Diagnosis Date   Allergy    Anxiety    Arthritis    Cataract    beginning stage bilateral   Diverticulosis    Dyspnea    Elevated PSA    Esophageal stricture    Esophagitis    GERD (gastroesophageal reflux disease)    Gout    History of kidney stones    Hyperlipidemia    Hypertension    Inguinal hernia    Internal hemorrhoids    Pancreatitis    Prostate cancer (HCC)    Tubular adenoma of colon     Surgeries: Procedure(s): ARTHROPLASTY, HIP, TOTAL, ANTERIOR APPROACH on 02/10/2024   Consultants:   Discharged Condition: Improved  Hospital Course: [name] is an 83 y.o. male who was admitted 02/10/2024 for operative treatment ofOA (osteoarthritis) of hip. Patient has severe unremitting pain that affects sleep, daily activities, and work/hobbies. After pre-op clearance the patient was taken to the operating room on 02/10/2024 and underwent  Procedure(s): ARTHROPLASTY, HIP, TOTAL, ANTERIOR APPROACH.    Patient was given perioperative antibiotics:  Anti-infectives (From admission, onward)    Start     Dose/Rate Route Frequency Ordered Stop   02/10/24 1800  ceFAZolin  (ANCEF ) IVPB 2g/100 mL premix        2 g 200 mL/hr over 30 Minutes Intravenous Every 6 hours 02/10/24 1509 02/11/24 0756   02/10/24 0915  ceFAZolin  (ANCEF ) IVPB 2g/100 mL premix        2 g 200 mL/hr over 30 Minutes Intravenous On call to O.R. 02/10/24 0900 02/10/24 1134        Patient was given sequential compression devices, early ambulation, and chemoprophylaxis to prevent DVT.  Patient benefited maximally from hospital stay and there were no complications.    Recent vital signs: Patient Vitals for  the past 24 hrs:  BP Temp Temp src Pulse Resp SpO2  02/11/24 1329 123/61 98.7 F (37.1 C) Oral 81 16 94 %  02/11/24 1056 (!) 121/59 -- -- 77 -- 94 %     Recent laboratory studies:  Recent Labs    02/11/24 0332  WBC 10.7*  HGB 11.5*  HCT 35.1*  PLT 180  NA 139  K 4.4  CL 103  CO2 26  BUN 12  CREATININE 1.28*  GLUCOSE 121*  CALCIUM 9.0     Discharge Medications:   Allergies as of 02/11/2024       Reactions   Penicillins Rash   Did it involve swelling of the face/tongue/throat, SOB, or low BP? No Did it involve sudden or severe rash/hives, skin peeling, or any reaction on the inside of your mouth or nose? No Did you need to seek medical attention at a hospital or doctor's office? No When did it last happen?      60 Years If all above answers are NO, may proceed with cephalosporin use. Tolerated Cephalosporin Date: 02/11/24.          Medication List     STOP taking these medications    aspirin 81 MG tablet   CENTRUM SILVER 50+MEN PO       TAKE these medications    acetaminophen  500 MG tablet Commonly known as: TYLENOL  Take 500 mg  by mouth every 6 (six) hours as needed for mild pain (pain score 1-3), moderate pain (pain score 4-6) or headache.   carvedilol  12.5 MG tablet Commonly known as: COREG  Take 0.5 tablets (6.25 mg total) by mouth 2 (two) times daily with a meal.   clindamycin  150 MG capsule Commonly known as: CLEOCIN  Take 600 mg by mouth once.   fluticasone 50 MCG/ACT nasal spray Commonly known as: FLONASE Place 2 sprays into both nostrils in the morning.   HYDROcodone -acetaminophen  5-325 MG tablet Commonly known as: NORCO/VICODIN Take 1 tablet by mouth every 4 (four) hours as needed for severe pain (pain score 7-10).   methocarbamol  500 MG tablet Commonly known as: ROBAXIN  Take 1 tablet (500 mg total) by mouth every 6 (six) hours as needed for muscle spasms.   Mitigare  0.6 MG Caps Generic drug: Colchicine  TAKE 0.6 MG BY MOUTH DAILY  AS NEEDED (GOUT FLARE).   ondansetron  4 MG tablet Commonly known as: ZOFRAN  Take 1 tablet (4 mg total) by mouth every 6 (six) hours as needed for nausea.   pantoprazole  40 MG tablet Commonly known as: PROTONIX  Take 1 tablet (40 mg total) by mouth 2 (two) times daily.   polyethylene glycol 17 g packet Commonly known as: MIRALAX  / GLYCOLAX  Take 17 g by mouth daily.   tamsulosin  0.4 MG Caps capsule Commonly known as: FLOMAX  Take 1 capsule (0.4 mg total) by mouth daily.   traMADol  50 MG tablet Commonly known as: ULTRAM  Take 1-2 tablets (50-100 mg total) by mouth every 6 (six) hours as needed for moderate pain (pain score 4-6).   valsartan  320 MG tablet Commonly known as: DIOVAN  Take 1 tablet (320 mg total) by mouth daily.   Xarelto  10 MG Tabs tablet Generic drug: rivaroxaban  Take 1 tablet (10 mg total) by mouth daily with breakfast for 20 days. Then resume one 81 mg aspirin once a day               Discharge Care Instructions  (From admission, onward)           Start     Ordered   02/11/24 0000  Weight bearing as tolerated        02/11/24 0753   02/11/24 0000  Change dressing       Comments: You have an adhesive waterproof bandage over the incision. Leave this in place until your first follow-up appointment. Once you remove this you will not need to place another bandage.   02/11/24 0753            Diagnostic Studies: DG Pelvis Portable Result Date: 02/10/2024 CLINICAL DATA:  Postop. EXAM: PORTABLE PELVIS 1-2 VIEWS COMPARISON:  None Available. FINDINGS: Left hip arthroplasty in expected alignment. No periprosthetic lucency or fracture. Recent postsurgical change includes air and edema in the soft tissues. IMPRESSION: Left hip arthroplasty without immediate postoperative complication. Electronically Signed   By: Andrea Gasman M.D.   On: 02/10/2024 15:00   DG HIP UNILAT WITH PELVIS 1V LEFT Result Date: 02/10/2024 CLINICAL DATA:  Elective surgery. EXAM: DG  HIP (WITH OR WITHOUT PELVIS) 1V*L* COMPARISON:  06/12/2022 FINDINGS: Two fluoroscopic spot views of the pelvis and left hip obtained in the operating room. Images during hip arthroplasty. Fluoroscopy time 4 seconds. Dose 0.8 mGy. IMPRESSION: Intraoperative fluoroscopy during left hip arthroplasty. Electronically Signed   By: Andrea Gasman M.D.   On: 02/10/2024 15:00   DG C-Arm 1-60 Min-No Report Result Date: 02/10/2024 Fluoroscopy was utilized by the requesting physician.  No radiographic interpretation.    Disposition: Discharge disposition: 01-Home or Self Care       Discharge Instructions     Call MD / Call 911   Complete by: As directed    If you experience chest pain or shortness of breath, CALL 911 and be transported to the hospital emergency room.  If you develope a fever above 101 F, pus (white drainage) or increased drainage or redness at the wound, or calf pain, call your surgeon's office.   Change dressing   Complete by: As directed    You have an adhesive waterproof bandage over the incision. Leave this in place until your first follow-up appointment. Once you remove this you will not need to place another bandage.   Constipation Prevention   Complete by: As directed    Drink plenty of fluids.  Prune juice may be helpful.  You may use a stool softener, such as Colace (over the counter) 100 mg twice a day.  Use MiraLax  (over the counter) for constipation as needed.   Diet - low sodium heart healthy   Complete by: As directed    Do not sit on low chairs, stoools or toilet seats, as it may be difficult to get up from low surfaces   Complete by: As directed    Driving restrictions   Complete by: As directed    No driving for two weeks   Post-operative opioid taper instructions:   Complete by: As directed    POST-OPERATIVE OPIOID TAPER INSTRUCTIONS: It is important to wean off of your opioid medication as soon as possible. If you do not need pain medication after your  surgery it is ok to stop day one. Opioids include: Codeine, Hydrocodone (Norco, Vicodin), Oxycodone (Percocet, oxycontin ) and hydromorphone amongst others.  Long term and even short term use of opiods can cause: Increased pain response Dependence Constipation Depression Respiratory depression And more.  Withdrawal symptoms can include Flu like symptoms Nausea, vomiting And more Techniques to manage these symptoms Hydrate well Eat regular healthy meals Stay active Use relaxation techniques(deep breathing, meditating, yoga) Do Not substitute Alcohol to help with tapering If you have been on opioids for less than two weeks and do not have pain than it is ok to stop all together.  Plan to wean off of opioids This plan should start within one week post op of your joint replacement. Maintain the same interval or time between taking each dose and first decrease the dose.  Cut the total daily intake of opioids by one tablet each day Next start to increase the time between doses. The last dose that should be eliminated is the evening dose.      TED hose   Complete by: As directed    Use stockings (TED hose) for three weeks on both leg(s).  You may remove them at night for sleeping.   Weight bearing as tolerated   Complete by: As directed         Follow-up Information     Melodi Lerner, MD. Go on 02/25/2024.   Specialty: Orthopedic Surgery Why: You are scheduled for a post op appointment on Tuesday 02/25/24 at 11:15am Contact information: [address] [place] [address] [place] [address] [phone]                  Signed: Roxie Mess 02/12/2024, 8:42 AM

## 2024-02-12 NOTE — Discharge Summary (Signed)
 Patient ID: ZAYVIAN MCMURTRY MRN: [id number] DOB/AGE: 07-Feb-1941 83 y.o.  Admit date: 02/10/2024 Discharge date: 02/11/2024  Admission Diagnoses:  Principal Problem:   OA (osteoarthritis) of hip Active Problems:   Primary osteoarthritis of left hip   Discharge Diagnoses:  Same  Past Medical History:  Diagnosis Date   Allergy    Anxiety    Arthritis    Cataract    beginning stage bilateral   Diverticulosis    Dyspnea    Elevated PSA    Esophageal stricture    Esophagitis    GERD (gastroesophageal reflux disease)    Gout    History of kidney stones    Hyperlipidemia    Hypertension    Inguinal hernia    Internal hemorrhoids    Pancreatitis    Prostate cancer (HCC)    Tubular adenoma of colon     Surgeries: Procedure(s): ARTHROPLASTY, HIP, TOTAL, ANTERIOR APPROACH on 02/10/2024   Consultants:   Discharged Condition: Improved  Hospital Course: BRYAM TABORDA is an 83 y.o. male who was admitted 02/10/2024 for operative treatment ofOA (osteoarthritis) of hip. Patient has severe unremitting pain that affects sleep, daily activities, and work/hobbies. After pre-op clearance the patient was taken to the operating room on 02/10/2024 and underwent  Procedure(s): ARTHROPLASTY, HIP, TOTAL, ANTERIOR APPROACH.    Patient was given perioperative antibiotics:  Anti-infectives (From admission, onward)    Start     Dose/Rate Route Frequency Ordered Stop   02/10/24 1800  ceFAZolin  (ANCEF ) IVPB 2g/100 mL premix        2 g 200 mL/hr over 30 Minutes Intravenous Every 6 hours 02/10/24 1509 02/11/24 0756   02/10/24 0915  ceFAZolin  (ANCEF ) IVPB 2g/100 mL premix        2 g 200 mL/hr over 30 Minutes Intravenous On call to O.R. 02/10/24 0900 02/10/24 1134        Patient was given sequential compression devices, early ambulation, and chemoprophylaxis to prevent DVT.  Patient benefited maximally from hospital stay and there were no complications.    Recent vital signs: Patient Vitals for  the past 24 hrs:  BP Temp Temp src Pulse Resp SpO2  02/11/24 1329 123/61 98.7 F (37.1 C) Oral 81 16 94 %  02/11/24 1056 (!) 121/59 -- -- 77 -- 94 %     Recent laboratory studies:  Recent Labs    02/11/24 0332  WBC 10.7*  HGB 11.5*  HCT 35.1*  PLT 180  NA 139  K 4.4  CL 103  CO2 26  BUN 12  CREATININE 1.28*  GLUCOSE 121*  CALCIUM 9.0     Discharge Medications:   Allergies as of 02/11/2024       Reactions   Penicillins Rash   Did it involve swelling of the face/tongue/throat, SOB, or low BP? No Did it involve sudden or severe rash/hives, skin peeling, or any reaction on the inside of your mouth or nose? No Did you need to seek medical attention at a hospital or doctor's office? No When did it last happen?      60 Years If all above answers are NO, may proceed with cephalosporin use. Tolerated Cephalosporin Date: 02/11/24.          Medication List     STOP taking these medications    aspirin 81 MG tablet   CENTRUM SILVER 50+MEN PO       TAKE these medications    acetaminophen  500 MG tablet Commonly known as: TYLENOL  Take 500 mg  by mouth every 6 (six) hours as needed for mild pain (pain score 1-3), moderate pain (pain score 4-6) or headache.   carvedilol  12.5 MG tablet Commonly known as: COREG  Take 0.5 tablets (6.25 mg total) by mouth 2 (two) times daily with a meal.   clindamycin  150 MG capsule Commonly known as: CLEOCIN  Take 600 mg by mouth once.   fluticasone 50 MCG/ACT nasal spray Commonly known as: FLONASE Place 2 sprays into both nostrils in the morning.   HYDROcodone -acetaminophen  5-325 MG tablet Commonly known as: NORCO/VICODIN Take 1 tablet by mouth every 4 (four) hours as needed for severe pain (pain score 7-10).   methocarbamol  500 MG tablet Commonly known as: ROBAXIN  Take 1 tablet (500 mg total) by mouth every 6 (six) hours as needed for muscle spasms.   Mitigare  0.6 MG Caps Generic drug: Colchicine  TAKE 0.6 MG BY MOUTH DAILY  AS NEEDED (GOUT FLARE).   ondansetron  4 MG tablet Commonly known as: ZOFRAN  Take 1 tablet (4 mg total) by mouth every 6 (six) hours as needed for nausea.   pantoprazole  40 MG tablet Commonly known as: PROTONIX  Take 1 tablet (40 mg total) by mouth 2 (two) times daily.   polyethylene glycol 17 g packet Commonly known as: MIRALAX  / GLYCOLAX  Take 17 g by mouth daily.   tamsulosin  0.4 MG Caps capsule Commonly known as: FLOMAX  Take 1 capsule (0.4 mg total) by mouth daily.   traMADol  50 MG tablet Commonly known as: ULTRAM  Take 1-2 tablets (50-100 mg total) by mouth every 6 (six) hours as needed for moderate pain (pain score 4-6).   valsartan  320 MG tablet Commonly known as: DIOVAN  Take 1 tablet (320 mg total) by mouth daily.   Xarelto  10 MG Tabs tablet Generic drug: rivaroxaban  Take 1 tablet (10 mg total) by mouth daily with breakfast for 20 days. Then resume one 81 mg aspirin once a day               Discharge Care Instructions  (From admission, onward)           Start     Ordered   02/11/24 0000  Weight bearing as tolerated        02/11/24 0753   02/11/24 0000  Change dressing       Comments: You have an adhesive waterproof bandage over the incision. Leave this in place until your first follow-up appointment. Once you remove this you will not need to place another bandage.   02/11/24 0753            Diagnostic Studies: DG Pelvis Portable Result Date: 02/10/2024 CLINICAL DATA:  Postop. EXAM: PORTABLE PELVIS 1-2 VIEWS COMPARISON:  None Available. FINDINGS: Left hip arthroplasty in expected alignment. No periprosthetic lucency or fracture. Recent postsurgical change includes air and edema in the soft tissues. IMPRESSION: Left hip arthroplasty without immediate postoperative complication. Electronically Signed   By: Andrea Gasman M.D.   On: 02/10/2024 15:00   DG HIP UNILAT WITH PELVIS 1V LEFT Result Date: 02/10/2024 CLINICAL DATA:  Elective surgery. EXAM: DG  HIP (WITH OR WITHOUT PELVIS) 1V*L* COMPARISON:  06/12/2022 FINDINGS: Two fluoroscopic spot views of the pelvis and left hip obtained in the operating room. Images during hip arthroplasty. Fluoroscopy time 4 seconds. Dose 0.8 mGy. IMPRESSION: Intraoperative fluoroscopy during left hip arthroplasty. Electronically Signed   By: Andrea Gasman M.D.   On: 02/10/2024 15:00   DG C-Arm 1-60 Min-No Report Result Date: 02/10/2024 Fluoroscopy was utilized by the requesting physician.  No radiographic interpretation.    Disposition: Discharge disposition: 01-Home or Self Care       Discharge Instructions     Call MD / Call 911   Complete by: As directed    If you experience chest pain or shortness of breath, CALL 911 and be transported to the hospital emergency room.  If you develope a fever above 101 F, pus (white drainage) or increased drainage or redness at the wound, or calf pain, call your surgeon's office.   Change dressing   Complete by: As directed    You have an adhesive waterproof bandage over the incision. Leave this in place until your first follow-up appointment. Once you remove this you will not need to place another bandage.   Constipation Prevention   Complete by: As directed    Drink plenty of fluids.  Prune juice may be helpful.  You may use a stool softener, such as Colace (over the counter) 100 mg twice a day.  Use MiraLax  (over the counter) for constipation as needed.   Diet - low sodium heart healthy   Complete by: As directed    Do not sit on low chairs, stoools or toilet seats, as it may be difficult to get up from low surfaces   Complete by: As directed    Driving restrictions   Complete by: As directed    No driving for two weeks   Post-operative opioid taper instructions:   Complete by: As directed    POST-OPERATIVE OPIOID TAPER INSTRUCTIONS: It is important to wean off of your opioid medication as soon as possible. If you do not need pain medication after your  surgery it is ok to stop day one. Opioids include: Codeine, Hydrocodone (Norco, Vicodin), Oxycodone (Percocet, oxycontin ) and hydromorphone amongst others.  Long term and even short term use of opiods can cause: Increased pain response Dependence Constipation Depression Respiratory depression And more.  Withdrawal symptoms can include Flu like symptoms Nausea, vomiting And more Techniques to manage these symptoms Hydrate well Eat regular healthy meals Stay active Use relaxation techniques(deep breathing, meditating, yoga) Do Not substitute Alcohol to help with tapering If you have been on opioids for less than two weeks and do not have pain than it is ok to stop all together.  Plan to wean off of opioids This plan should start within one week post op of your joint replacement. Maintain the same interval or time between taking each dose and first decrease the dose.  Cut the total daily intake of opioids by one tablet each day Next start to increase the time between doses. The last dose that should be eliminated is the evening dose.      TED hose   Complete by: As directed    Use stockings (TED hose) for three weeks on both leg(s).  You may remove them at night for sleeping.   Weight bearing as tolerated   Complete by: As directed         Follow-up Information     Melodi Lerner, MD. Go on 02/25/2024.   Specialty: Orthopedic Surgery Why: You are scheduled for a post op appointment on Tuesday 02/25/24 at 11:15am Contact information: [address] [place] [address] [place] [address] [phone]                  Signed: Roxie Mess 02/12/2024, 8:41 AM

## 2024-02-17 ENCOUNTER — Encounter (HOSPITAL_COMMUNITY): Payer: Self-pay | Admitting: Orthopedic Surgery

## 2024-02-17 NOTE — Anesthesia Procedure Notes (Signed)
 Spinal  Patient location during procedure: OR Start time: 02/10/2024 11:31 AM End time: 02/10/2024 11:31 AM Reason for block: surgical anesthesia  Staffing Performed: anesthesiologist  Authorized by: Dene Lauraine DASEN, MD   Performed by: Dene Lauraine DASEN, MD  Preanesthetic Checklist Completed: patient identified, IV checked, site marked, risks and benefits discussed, surgical consent, monitors and equipment checked, pre-op evaluation and timeout performed Spinal Block Patient position: sitting Prep: DuraPrep Patient monitoring: heart rate, cardiac monitor, continuous pulse ox and blood pressure Approach: midline Location: L3-4 Injection technique: single-shot Needle Needle type: Pencan  Needle gauge: 24 G Needle length: 9 cm Assessment Sensory level: Pending. Events: CSF return  Additional Notes Patient identified. Risks/Benefits/Options discussed with patient including but not limited to bleeding, infection, nerve damage, paralysis, failed block, blood pressure changes. Confirmed with bedside nurse the patient's most recent platelet count. Confirmed with patient that they are not currently taking any anticoagulation, have any bleeding history or any family history of bleeding disorders. Sterile technique was used throughout the entire procedure as documented above. See intraoperative record for vital signs throughout.   CANDIE Dene, MD

## 2024-02-17 NOTE — Addendum Note (Signed)
 Addendum  created 02/17/24 1531 by Dene Lauraine DASEN, MD   Child order released for a procedure order, Clinical Note Signed, Intraprocedure Blocks edited, SmartForm saved

## 2024-02-19 ENCOUNTER — Encounter: Payer: Self-pay | Admitting: Cardiology

## 2024-02-19 DIAGNOSIS — I10 Essential (primary) hypertension: Secondary | ICD-10-CM

## 2024-02-19 MED ORDER — CARVEDILOL 12.5 MG PO TABS: 12.5000 mg | ORAL_TABLET | Freq: Two times a day (BID) | ORAL | 3 refills | Status: DC

## 2024-03-20 ENCOUNTER — Other Ambulatory Visit: Payer: Self-pay | Admitting: Cardiology

## 2024-03-20 DIAGNOSIS — I10 Essential (primary) hypertension: Secondary | ICD-10-CM

## 2024-03-23 NOTE — Telephone Encounter (Signed)
 Creatine outside of Normal  In accordance with refill protocols, please review and address the following requirements before this medication refill can be authorized:  Labs

## 2024-06-16 ENCOUNTER — Other Ambulatory Visit

## 2024-06-18 ENCOUNTER — Ambulatory Visit: Admitting: Nurse Practitioner

## 2024-06-23 ENCOUNTER — Ambulatory Visit: Admitting: Physician Assistant

## 2024-11-03 ENCOUNTER — Ambulatory Visit
